# Patient Record
Sex: Female | Born: 1945 | Race: White | Hispanic: No | Smoking: Former smoker
Health system: Southern US, Community
[De-identification: ages and names within clinical notes are randomized; demographics above are authoritative.]

## PROBLEM LIST (undated history)

## (undated) ENCOUNTER — Emergency Department: Admission: EM

## (undated) DIAGNOSIS — O2230 Deep phlebothrombosis in pregnancy, unspecified trimester: Secondary | ICD-10-CM

## (undated) DIAGNOSIS — K08109 Complete loss of teeth, unspecified cause, unspecified class: Secondary | ICD-10-CM

## (undated) DIAGNOSIS — Z9221 Personal history of antineoplastic chemotherapy: Secondary | ICD-10-CM

## (undated) DIAGNOSIS — E7800 Pure hypercholesterolemia, unspecified: Secondary | ICD-10-CM

## (undated) DIAGNOSIS — C50919 Malignant neoplasm of unspecified site of unspecified female breast: Secondary | ICD-10-CM

## (undated) DIAGNOSIS — I82409 Acute embolism and thrombosis of unspecified deep veins of unspecified lower extremity: Secondary | ICD-10-CM

## (undated) DIAGNOSIS — Z923 Personal history of irradiation: Secondary | ICD-10-CM

## (undated) DIAGNOSIS — I4891 Unspecified atrial fibrillation: Secondary | ICD-10-CM

## (undated) DIAGNOSIS — C4490 Unspecified malignant neoplasm of skin, unspecified: Secondary | ICD-10-CM

## (undated) DIAGNOSIS — C4492 Squamous cell carcinoma of skin, unspecified: Secondary | ICD-10-CM

## (undated) DIAGNOSIS — I10 Essential (primary) hypertension: Secondary | ICD-10-CM

## (undated) DIAGNOSIS — Z972 Presence of dental prosthetic device (complete) (partial): Secondary | ICD-10-CM

## (undated) DIAGNOSIS — I5030 Unspecified diastolic (congestive) heart failure: Secondary | ICD-10-CM

## (undated) HISTORY — PX: GANGLION CYST EXCISION: SHX1691

## (undated) HISTORY — DX: Malignant neoplasm of unspecified site of unspecified female breast: C50.919

## (undated) HISTORY — DX: Unspecified malignant neoplasm of skin, unspecified: C44.90

## (undated) HISTORY — DX: Squamous cell carcinoma of skin, unspecified: C44.92

## (undated) HISTORY — DX: Essential (primary) hypertension: I10

## (undated) HISTORY — DX: Pure hypercholesterolemia, unspecified: E78.00

---

## 2004-01-30 ENCOUNTER — Ambulatory Visit: Payer: Self-pay | Admitting: Internal Medicine

## 2005-09-15 ENCOUNTER — Ambulatory Visit: Payer: Self-pay | Admitting: Internal Medicine

## 2005-09-15 IMAGING — US SCREENING ULTRASOUND OF ABDOMINAL AORTA
1 series · 14 of 18 positions shown · non-contrast
Comparison: none

REASON FOR EXAM: Abd pain, evaluate AAA
COMMENTS:

[Series 1: screening ultrasound of abdominal aorta · 0.40mm/px · 14 of 18 slices shown]
[im 1/18]
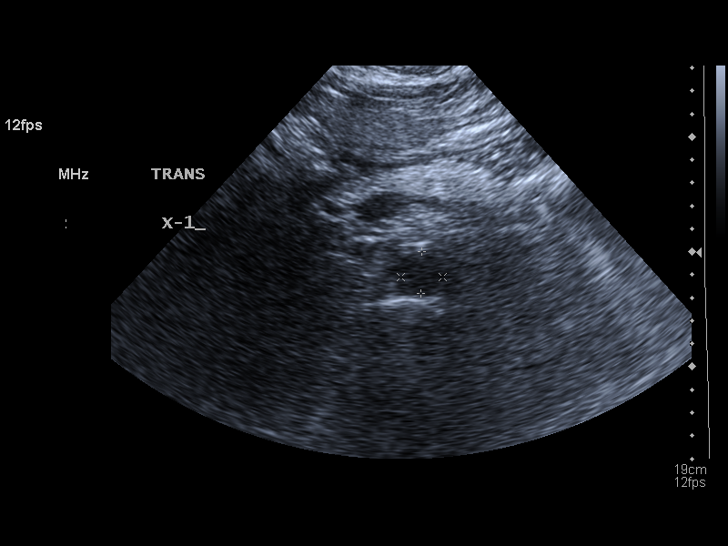
[im 2/18]
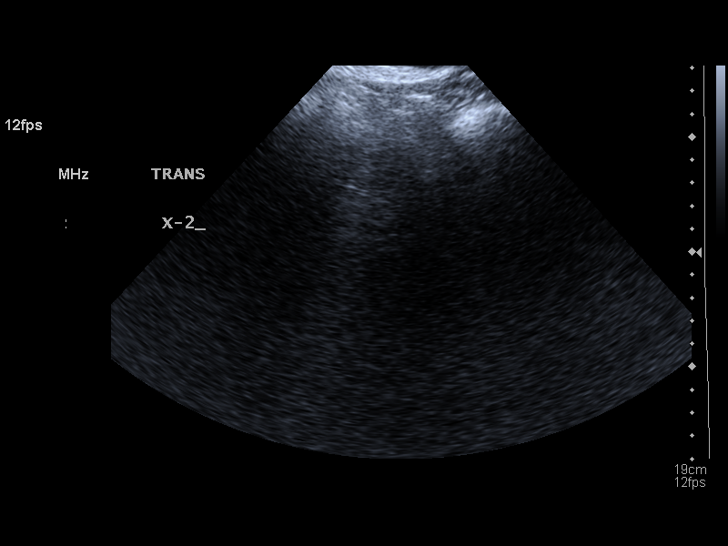
[im 4/18]
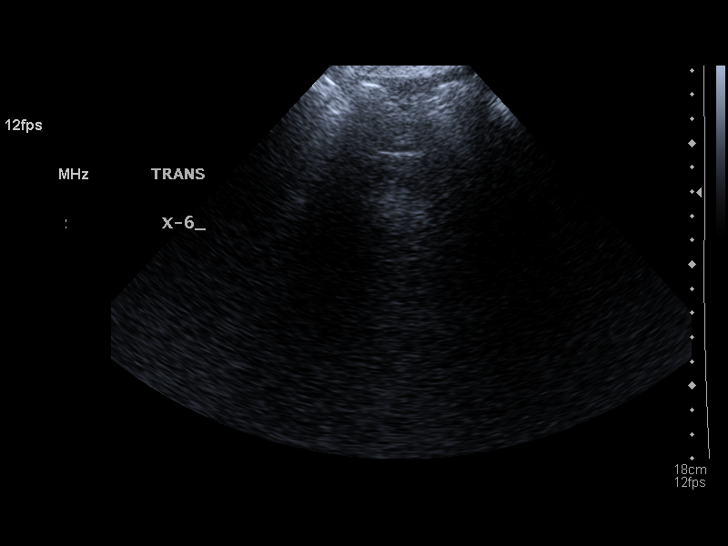
[im 5/18]
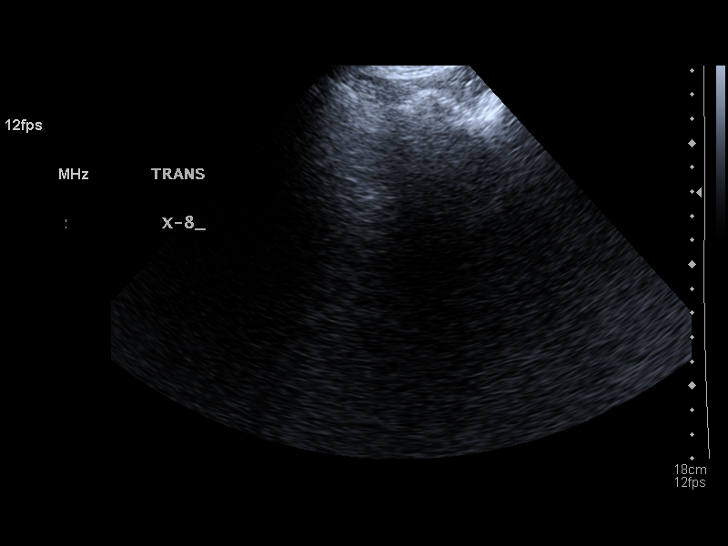
[im 6/18]
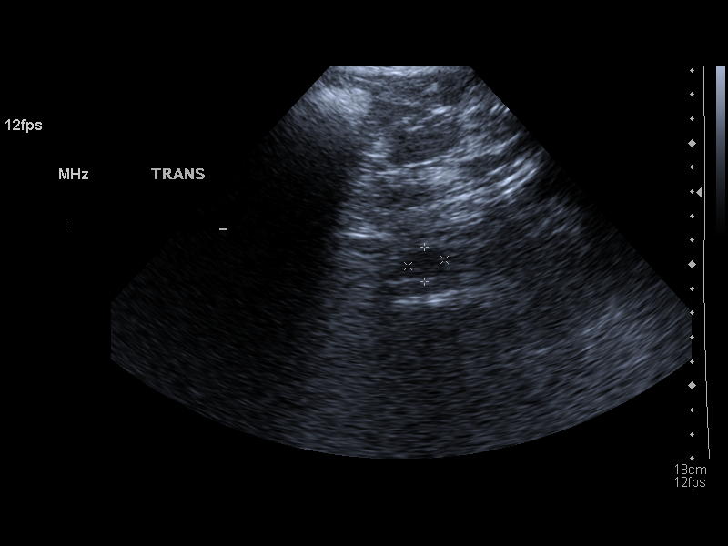
[im 8/18]
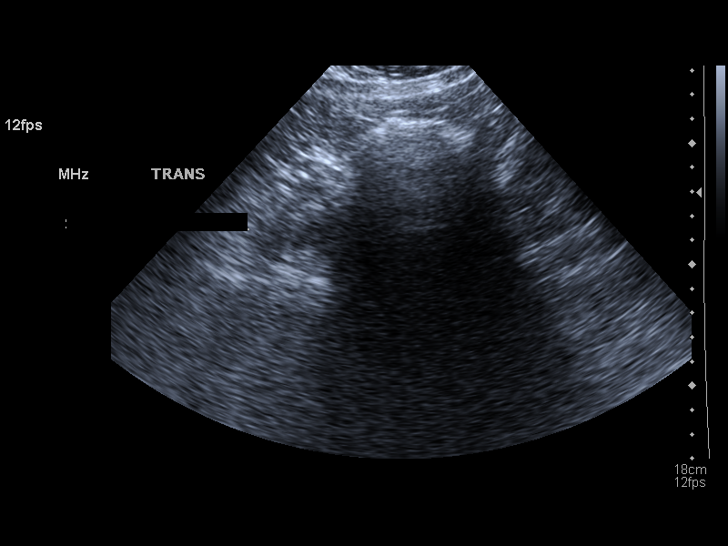
[im 9/18]
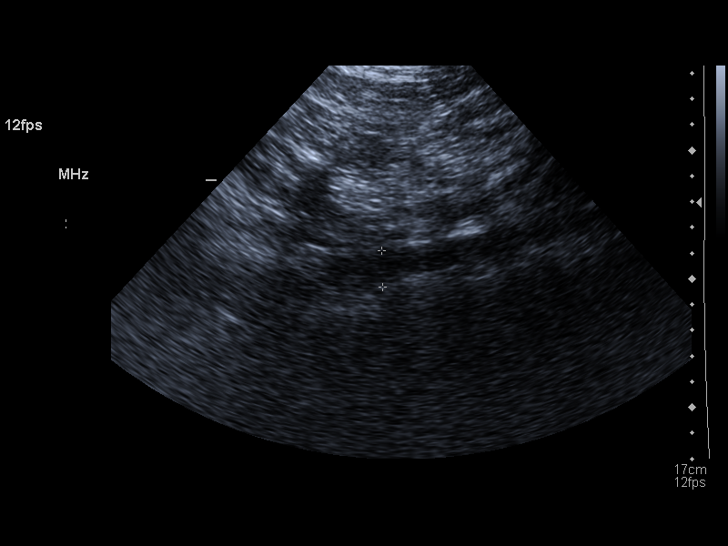
[im 10/18]
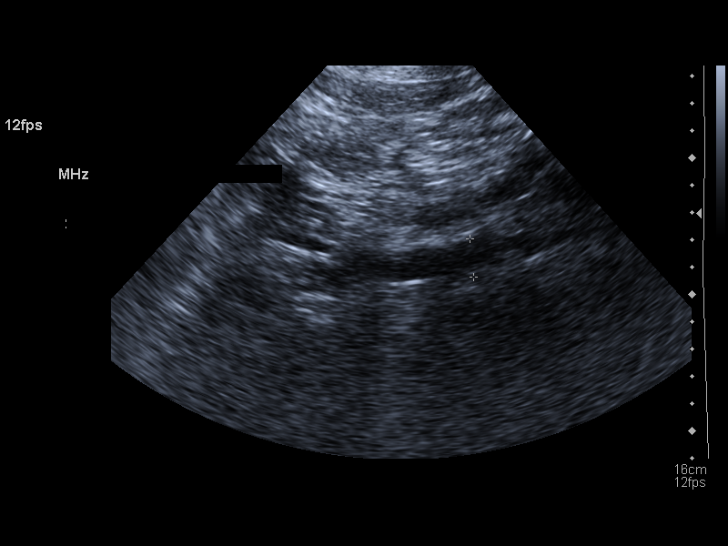
[im 11/18]
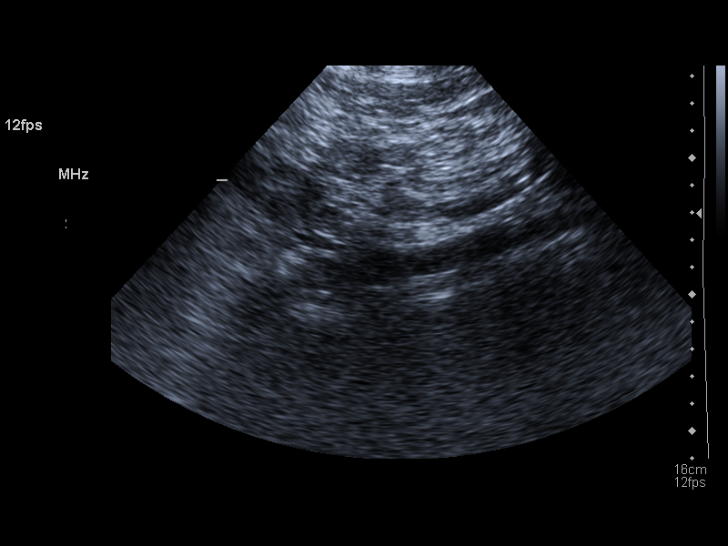
[im 13/18]
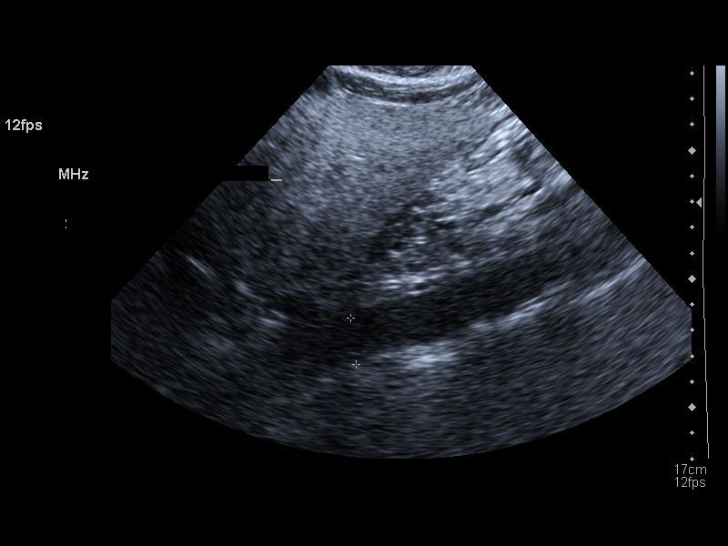
[im 14/18]
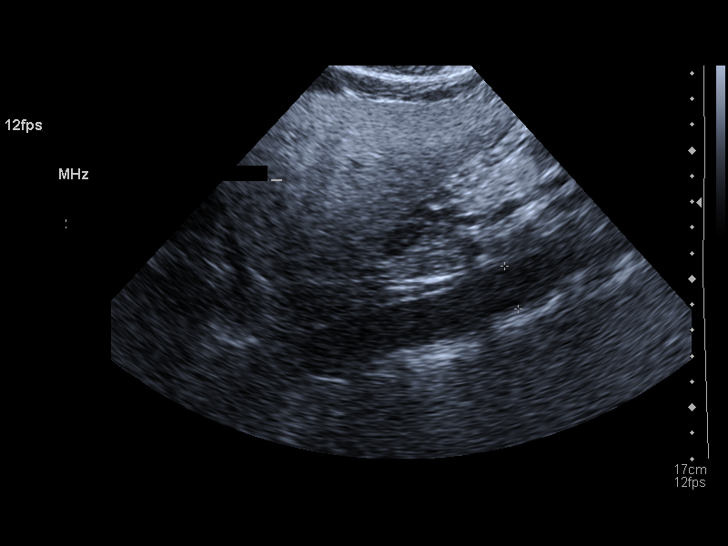
[im 15/18]
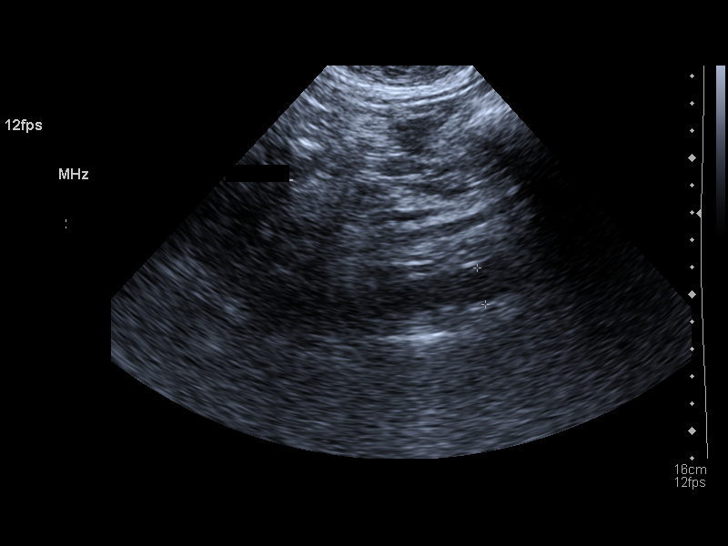
[im 17/18]
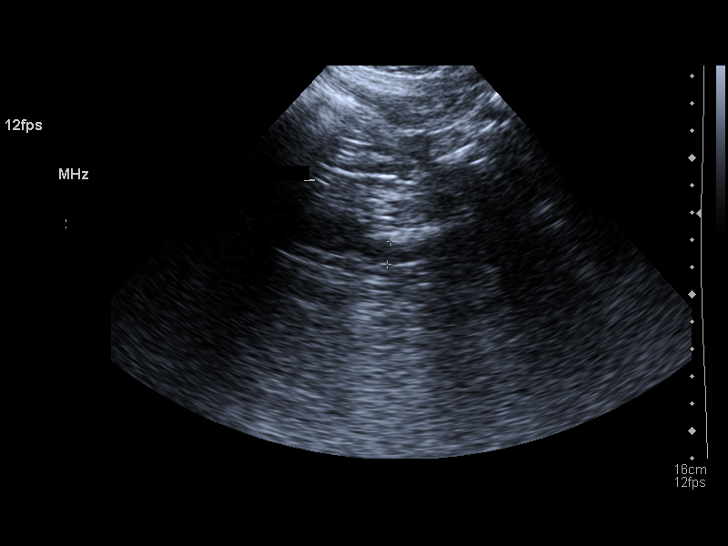
[im 18/18]
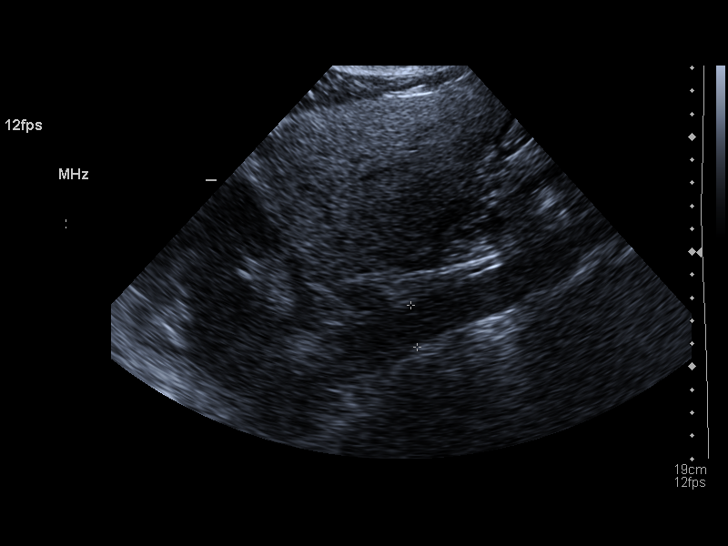

[14 of 18 positions shown; findings below may reference images not displayed]

PROCEDURE:     US  - US EXAM AAA SCREENING  - [DATE] [DATE]

RESULT:     The patient is experiencing abdominal discomfort and is being
evaluated for possible aneurysm of the abdominal aorta.

The maximal dimension of the abdominal aorta is approximately 18 mm.  There
is no evidence of an aneurysm.  The surrounding soft tissues exhibit normal
echotexture.
IMPRESSION: There is no evidence of an abdominal aortic aneurysm.  The proximal common
iliac vessels are normal in caliber as well.

## 2006-03-24 ENCOUNTER — Ambulatory Visit: Payer: Self-pay | Admitting: Internal Medicine

## 2006-03-24 DIAGNOSIS — C4492 Squamous cell carcinoma of skin, unspecified: Secondary | ICD-10-CM

## 2006-03-24 HISTORY — DX: Squamous cell carcinoma of skin, unspecified: C44.92

## 2006-05-05 DIAGNOSIS — C4492 Squamous cell carcinoma of skin, unspecified: Secondary | ICD-10-CM

## 2006-05-05 HISTORY — DX: Squamous cell carcinoma of skin, unspecified: C44.92

## 2006-06-29 DIAGNOSIS — L570 Actinic keratosis: Secondary | ICD-10-CM

## 2006-06-29 HISTORY — DX: Actinic keratosis: L57.0

## 2007-03-30 ENCOUNTER — Ambulatory Visit: Payer: Self-pay | Admitting: Gastroenterology

## 2007-10-12 ENCOUNTER — Ambulatory Visit: Payer: Self-pay | Admitting: Internal Medicine

## 2007-10-12 IMAGING — US US EXTREM LOW VENOUS*L*
1 series · 14 of 21 positions shown · non-contrast
Comparison: none

REASON FOR EXAM: knee pain lower extremity pain  Eval  DVT
COMMENTS:

[Series 1: us extrem low venous*left* · 14 of 21 slices shown]
[im 1/21]
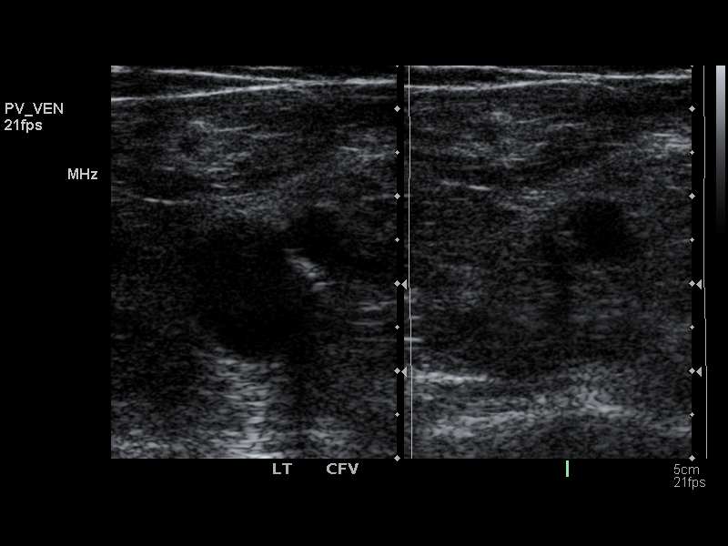
[im 3/21]
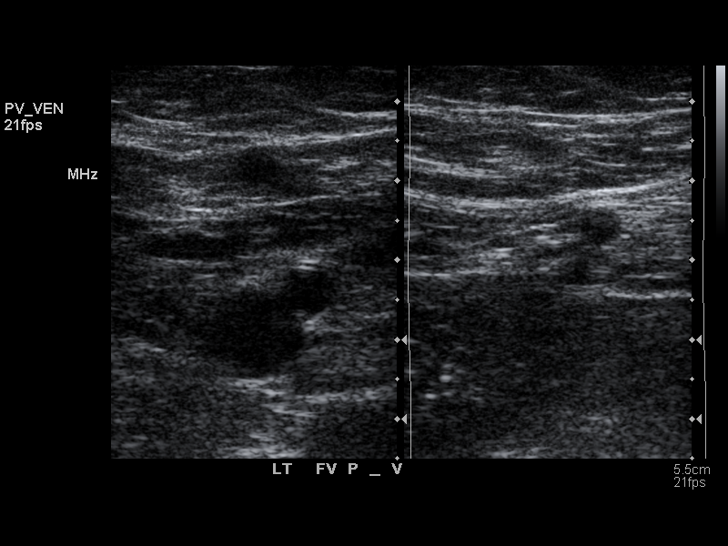
[im 4/21]
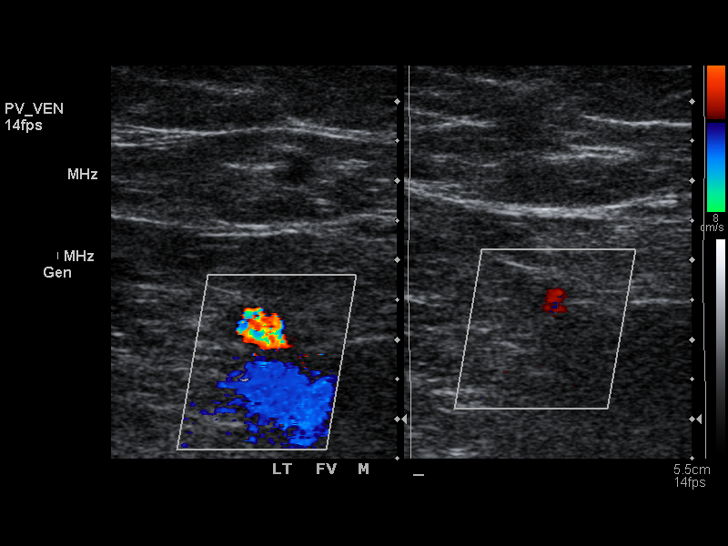
[im 6/21]
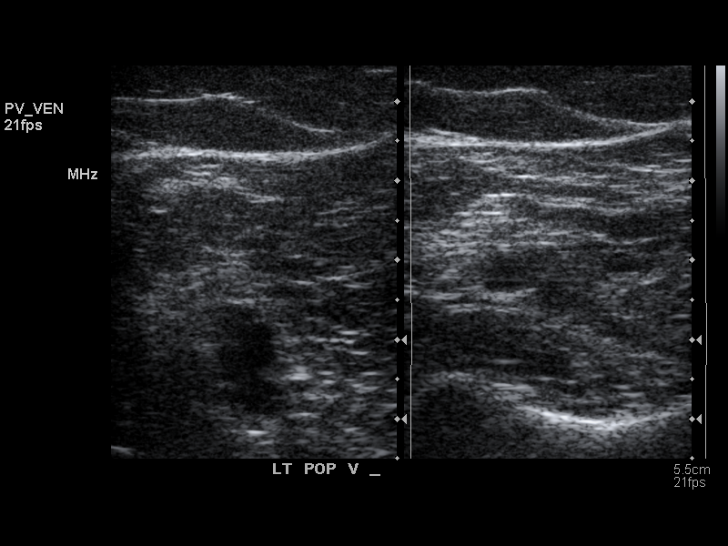
[im 7/21]
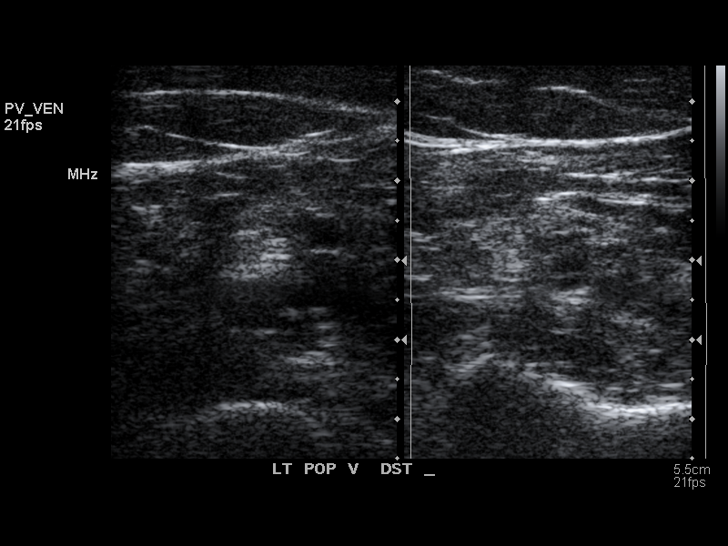
[im 9/21]
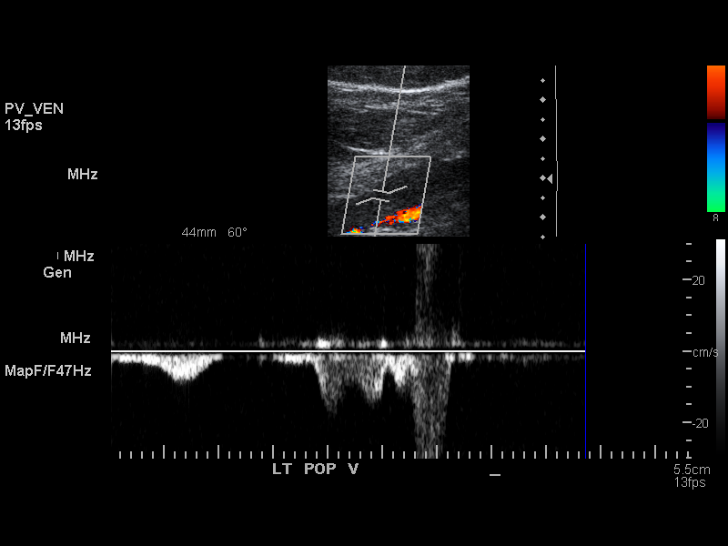
[im 10/21]
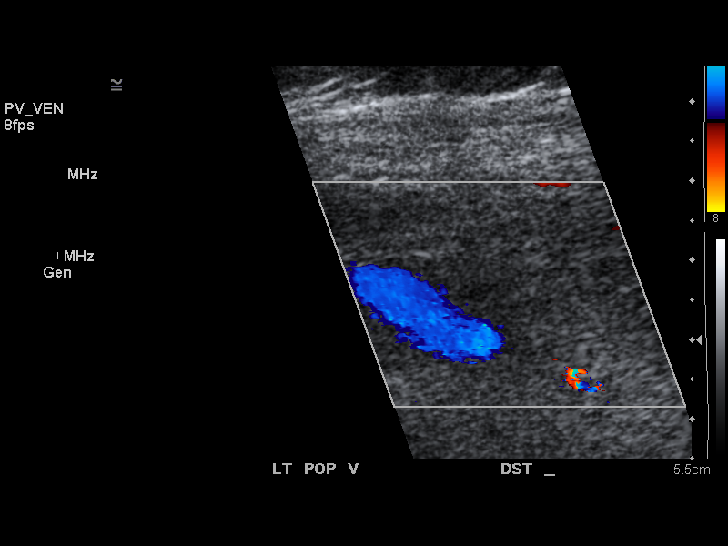
[im 12/21]
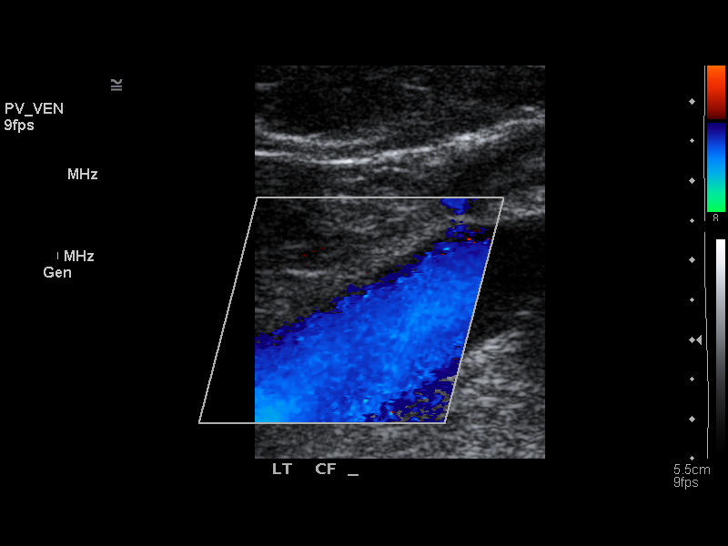
[im 13/21]
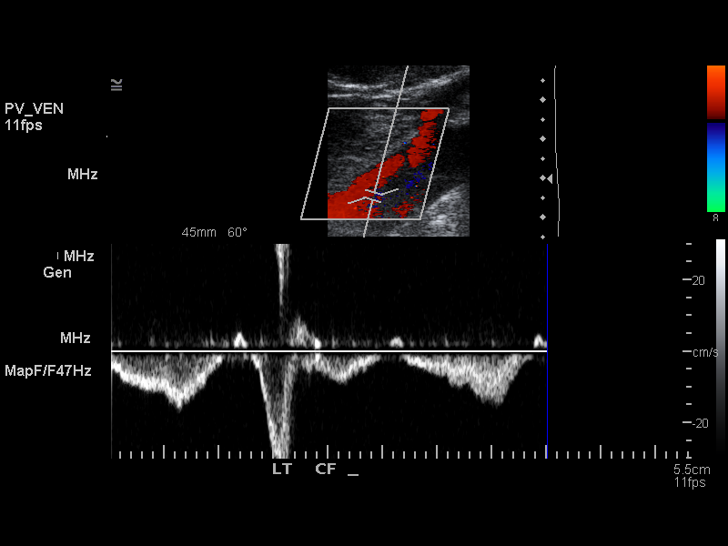
[im 15/21]
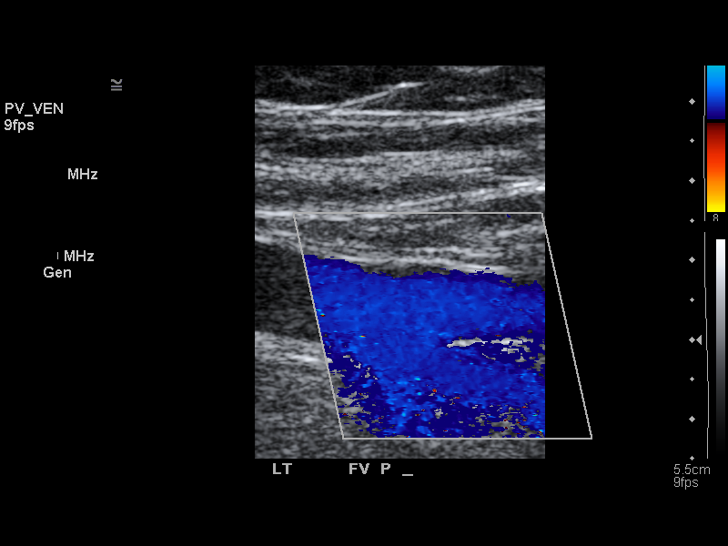
[im 16/21]
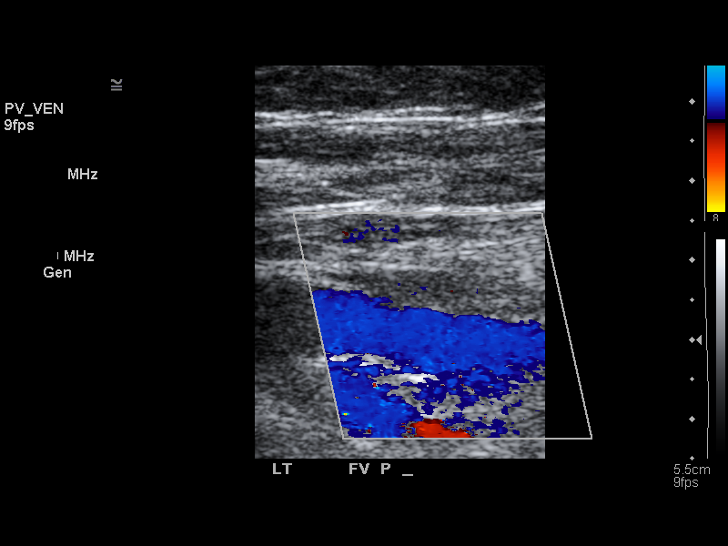
[im 18/21]
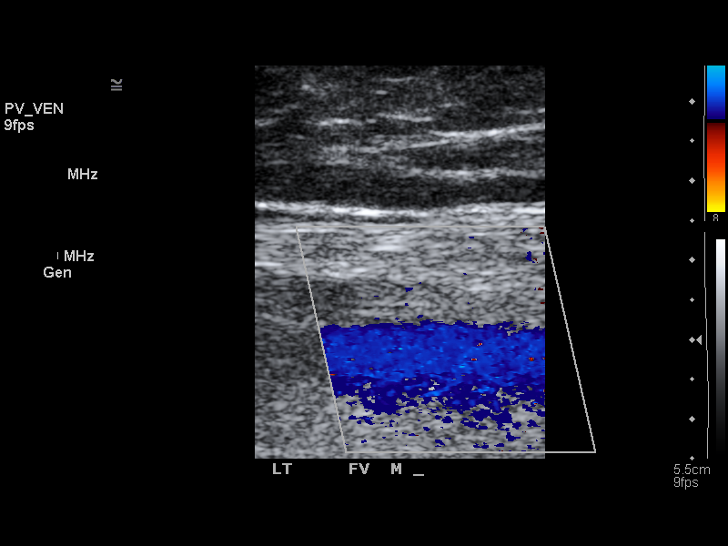
[im 19/21]
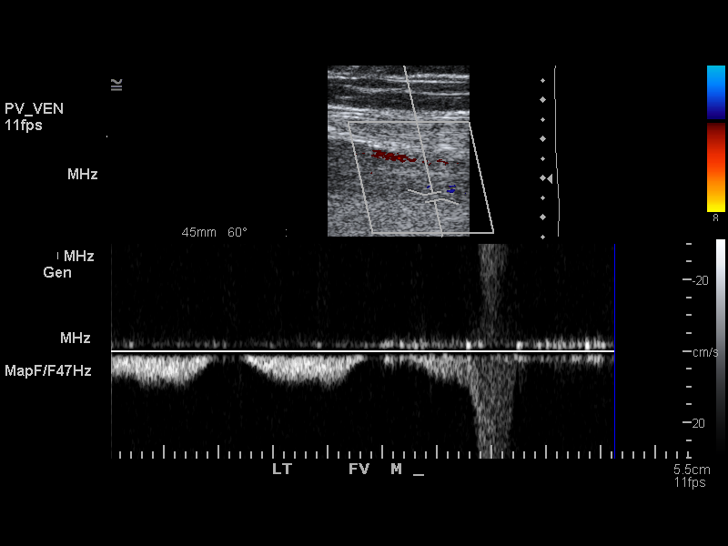
[im 21/21]
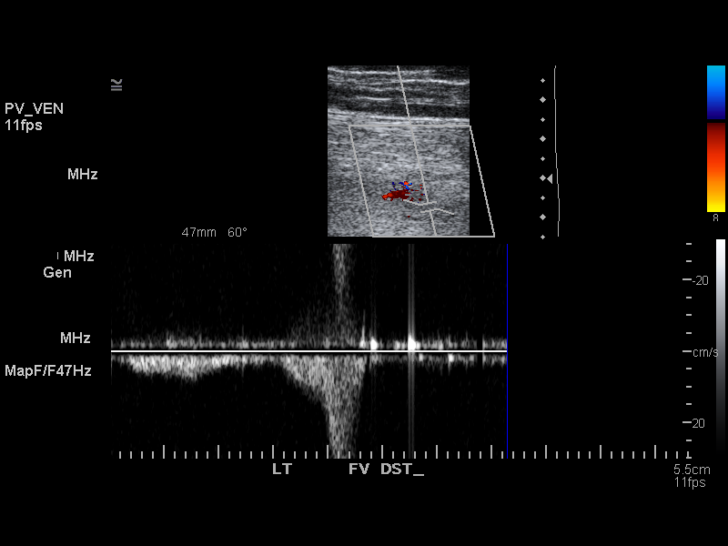

[14 of 21 positions shown; findings below may reference images not displayed]

PROCEDURE:     US  - US DOPPLER LOW EXTR LEFT  - [DATE] [DATE]

RESULT:     Gray scale, Duplex and SPECTRAL waveform imaging was performed
of the LEFT lower extremity. Evaluation of the interrogated vessels
demonstrates no evidence of increased echogenicity, non-compressibility,
abnormal gray scale flow nor abnormal waveform.
IMPRESSION: 1.No sonographic evidence of deep venous thrombus within the interrogated
vessels of the LEFT lower extremity.

## 2008-04-12 ENCOUNTER — Ambulatory Visit: Payer: Self-pay | Admitting: Internal Medicine

## 2008-04-12 IMAGING — MG MM CAD SCREENING MAMMO
1 series · 4 of 4 positions shown · non-contrast
Comparison: none

REASON FOR EXAM: Screening Mammo
COMMENTS:

PROCEDURE:     MAM - MAM DGTL SCREENING MAMMO W/CAD  - [DATE]  [DATE]
RESULT:     No dominant masses or pathologic clustered calcifications are
demonstrated. The exam is stable from prior exams. CAD evaluation is
nonfocal.

[R CC · right · 4 of 4 slices shown]
[im 1/4]
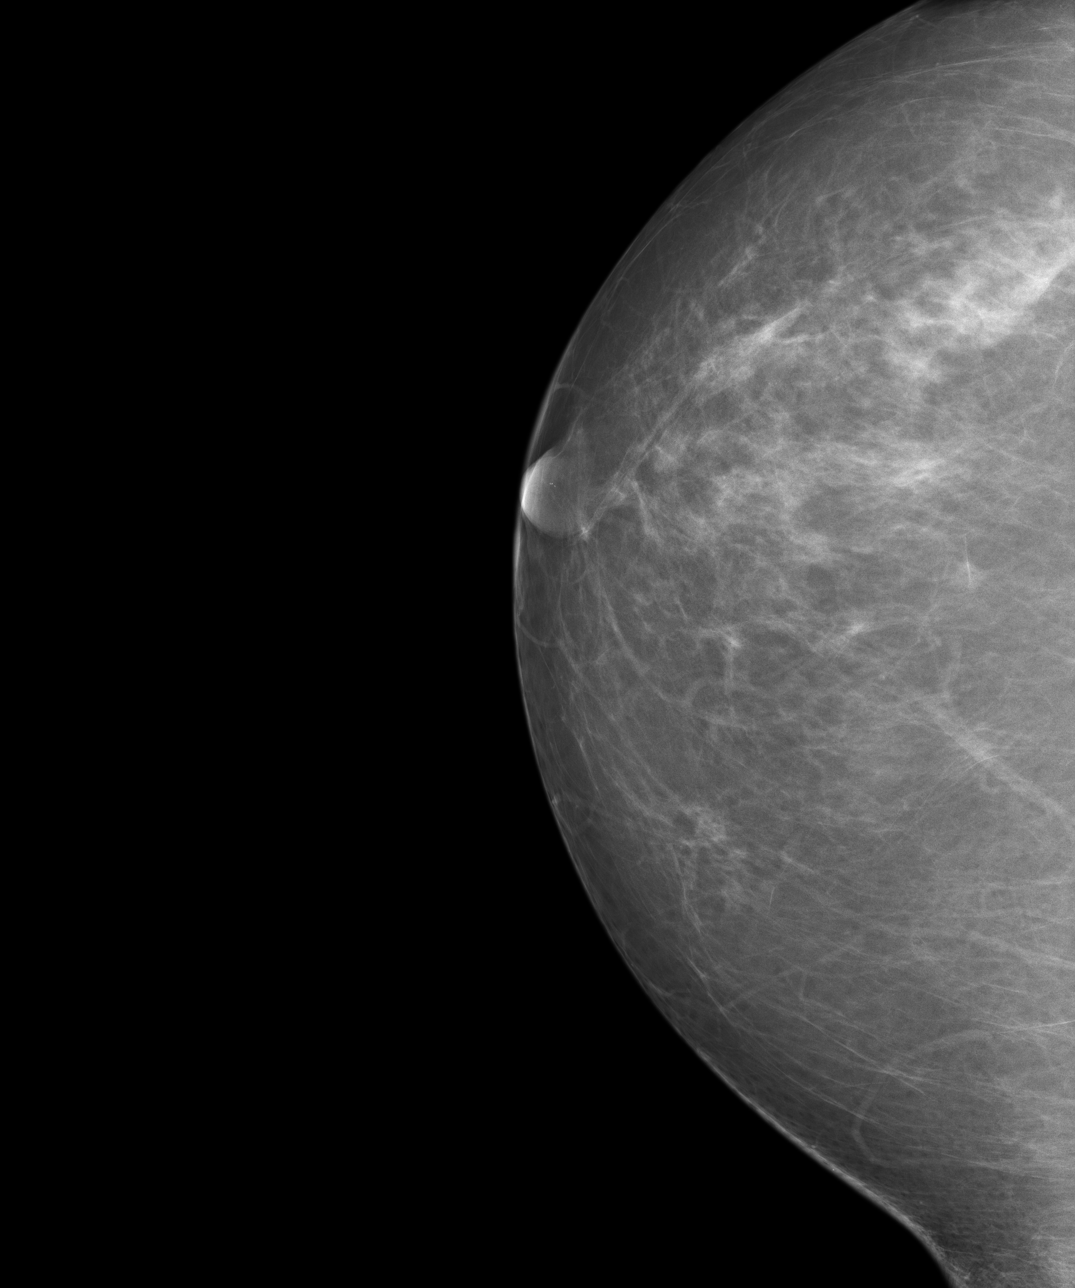
[im 2/4]
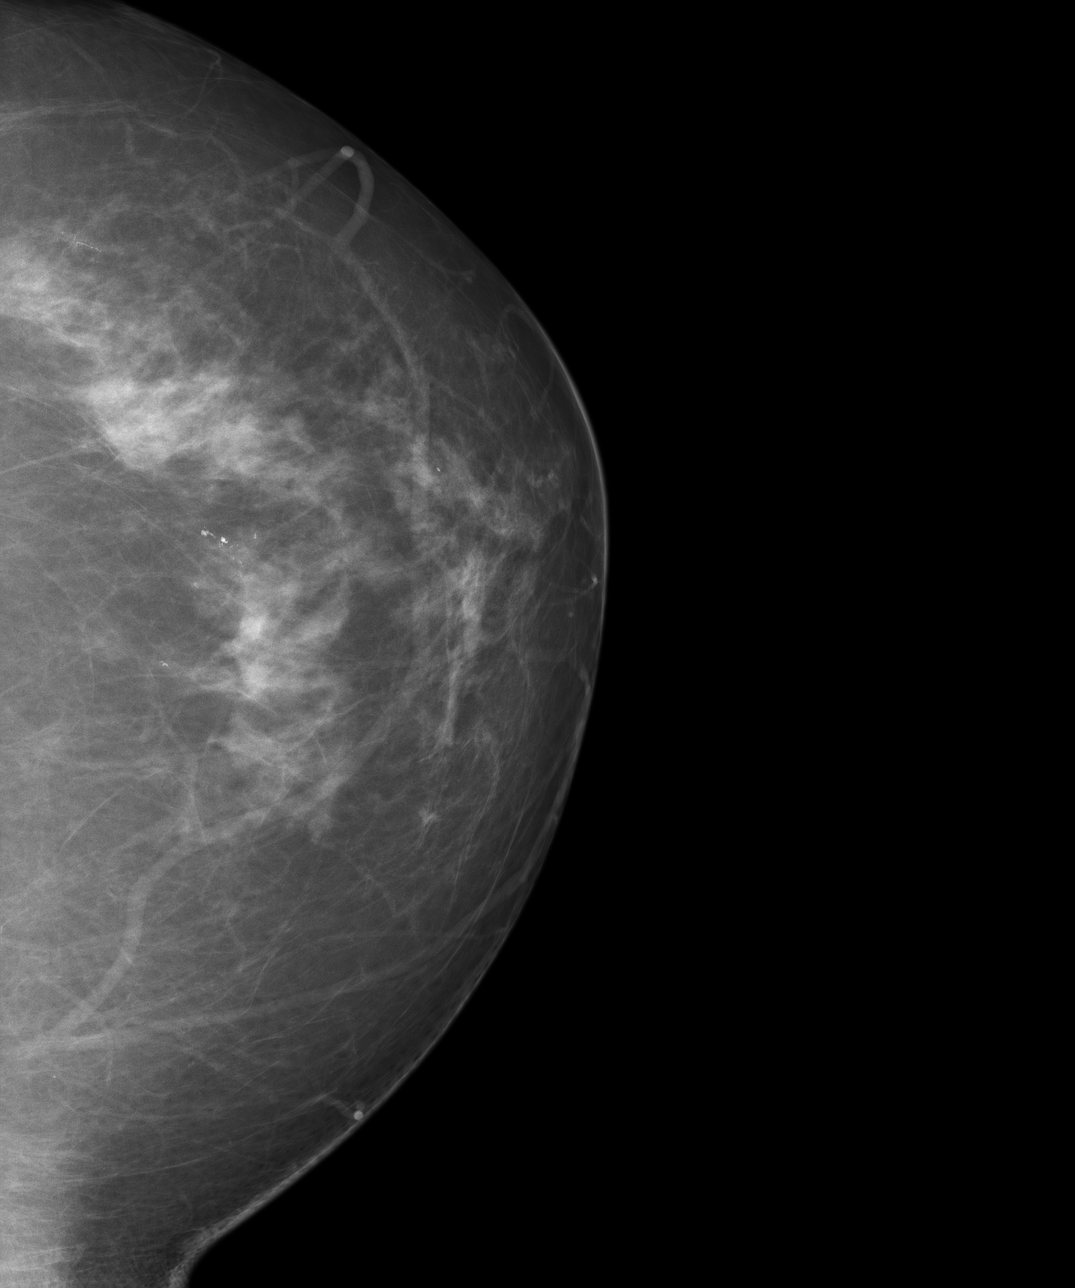
[im 3/4]
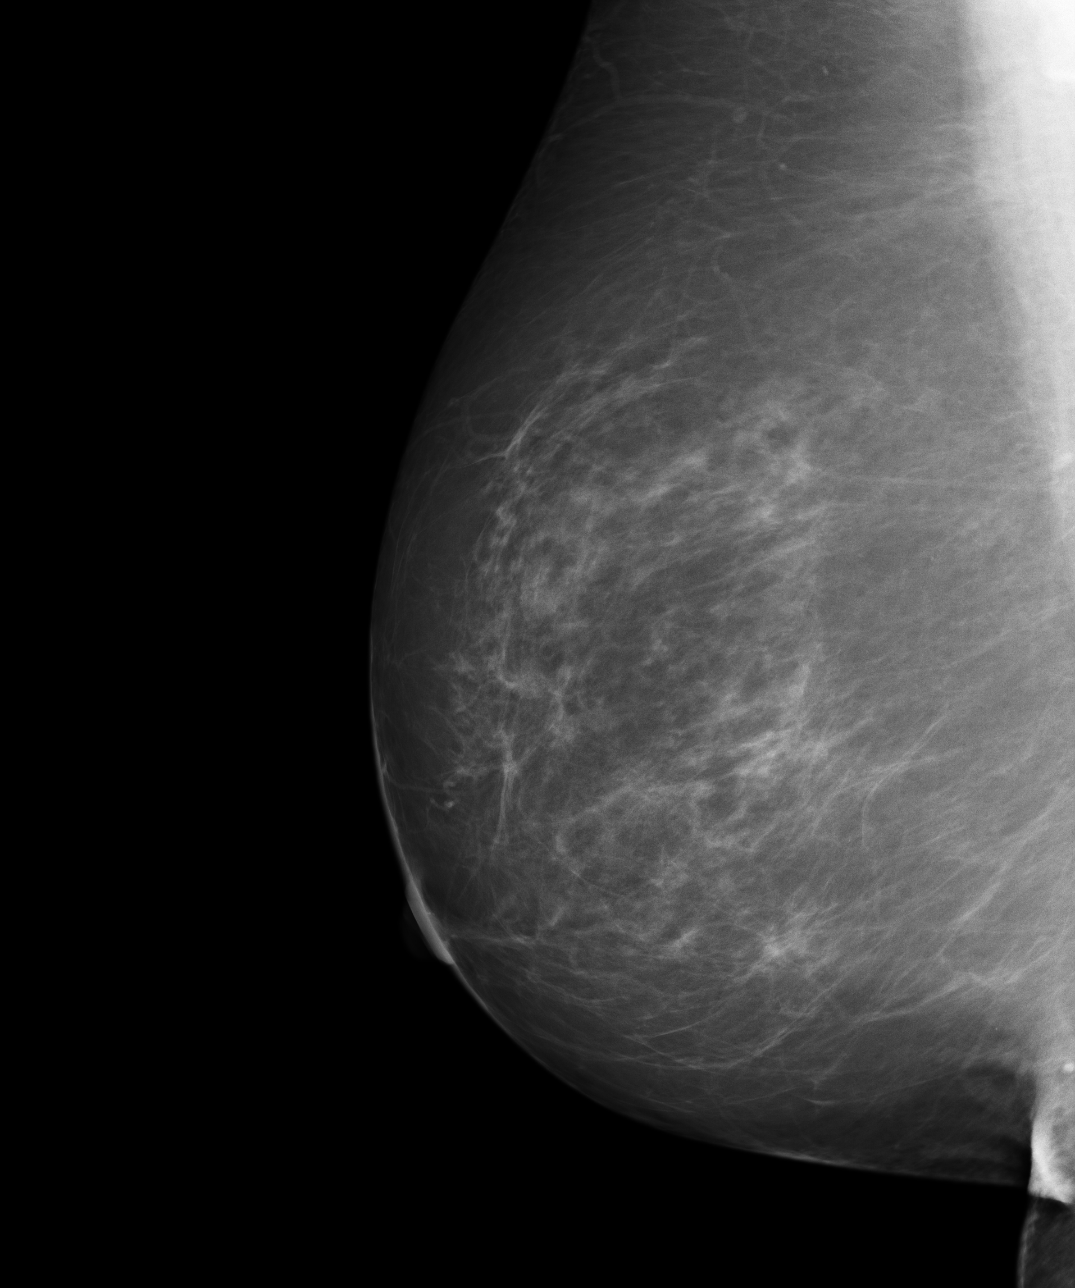
[im 4/4]
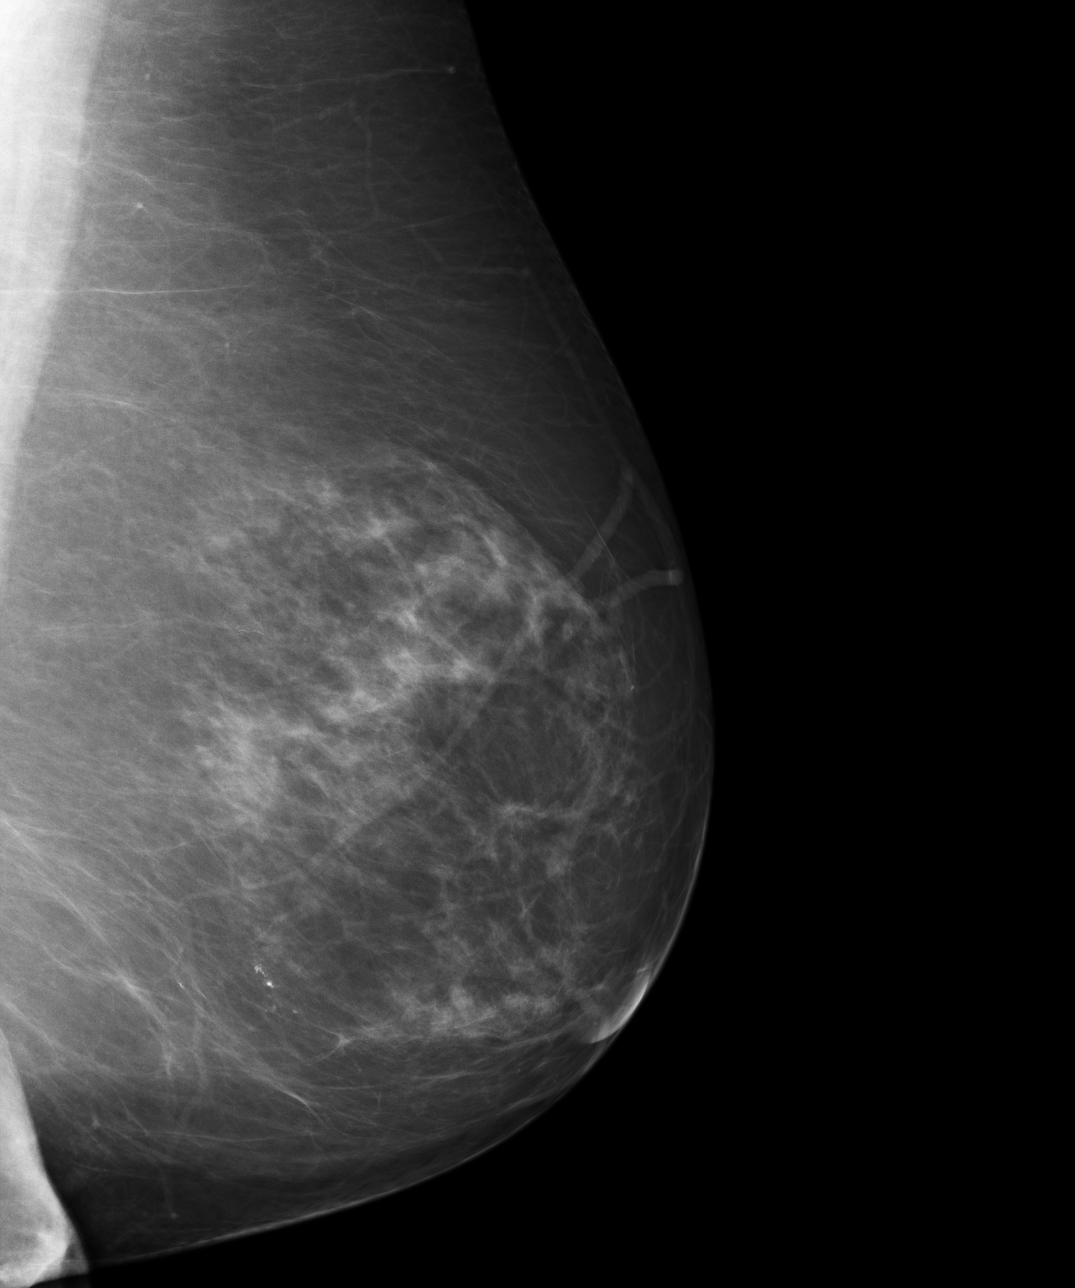

[4 of 4 positions shown; findings below may reference images not displayed]

IMPRESSION: Benign exam. Routine yearly follow-up mammograms are suggested.

BI-RADS: Category 2 - Benign Finding

Thank you for this opportunity to contribute to the care of your patient.

A NEGATIVE MAMMOGRAM REPORT DOES NOT PRECLUDE BIOPSY OR OTHER EVALUATION OF
A CLINICALLY PALPABLE OR OTHERWISE SUSPICIOUS MASS OR LESION. BREAST CANCER
MAY NOT BE DETECTED BY MAMMOGRAPHY IN UP TO 10% OF CASES.

## 2010-03-05 ENCOUNTER — Ambulatory Visit: Payer: Self-pay | Admitting: Internal Medicine

## 2010-03-05 IMAGING — MG MM CAD SCREENING MAMMO
1 series · 4 of 4 positions shown · non-contrast
Comparison: none

REASON FOR EXAM: SCR
COMMENTS:

[R CC · right · 4 of 4 slices shown]
[im 1/4]
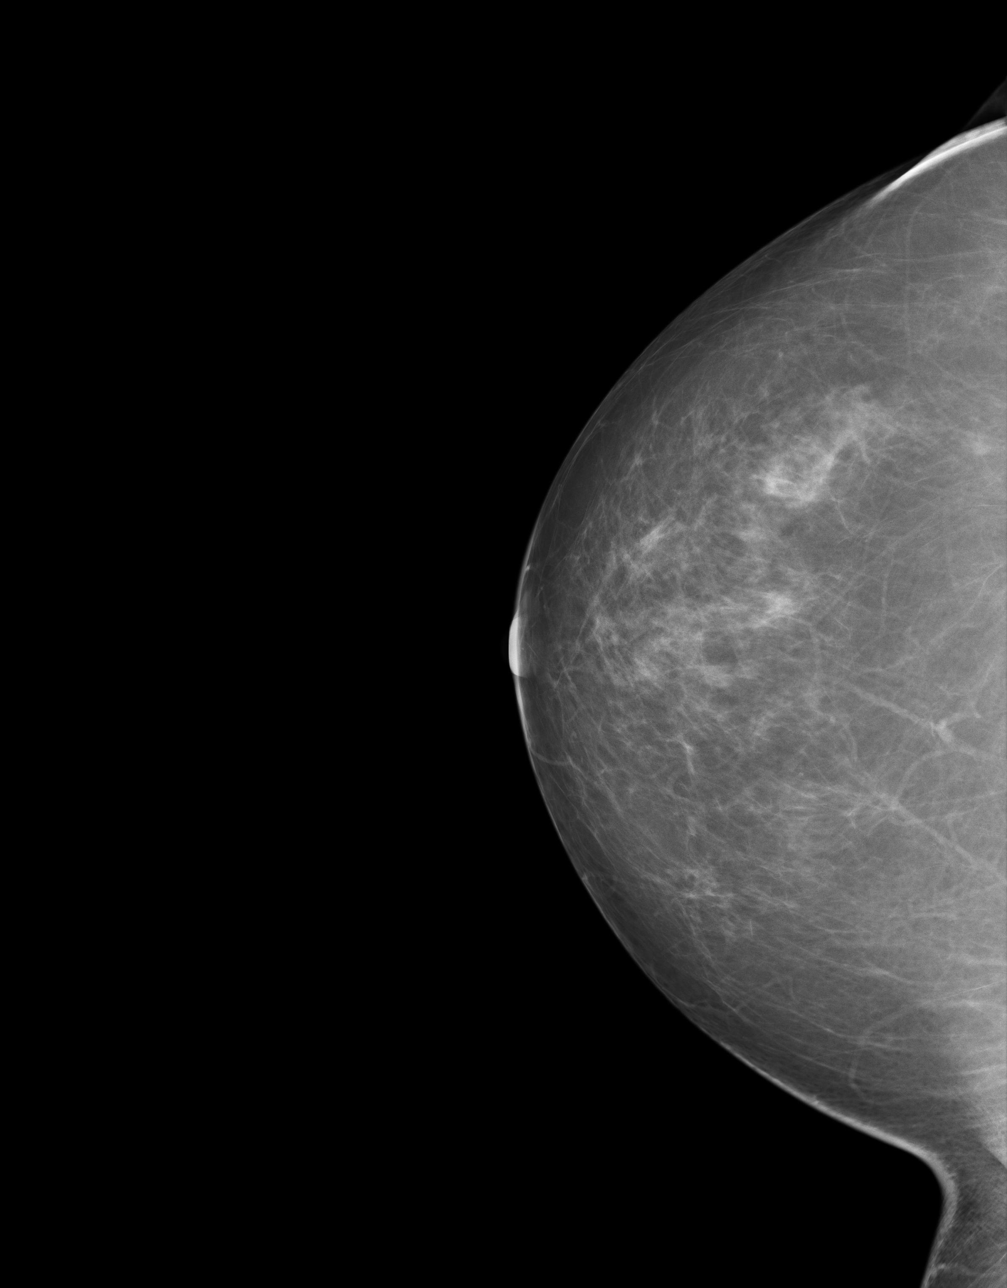
[im 2/4]
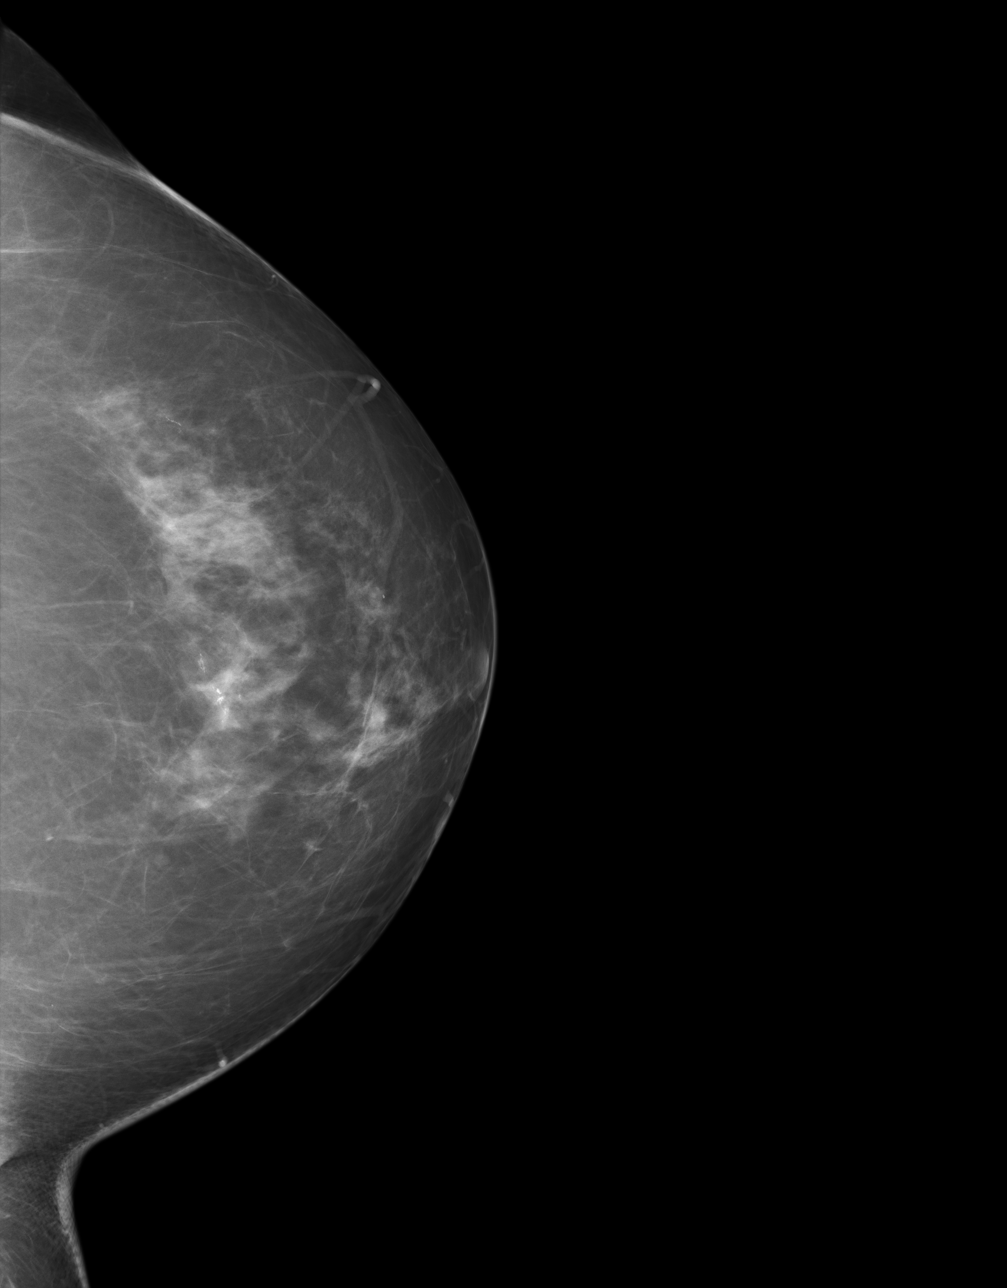
[im 3/4]
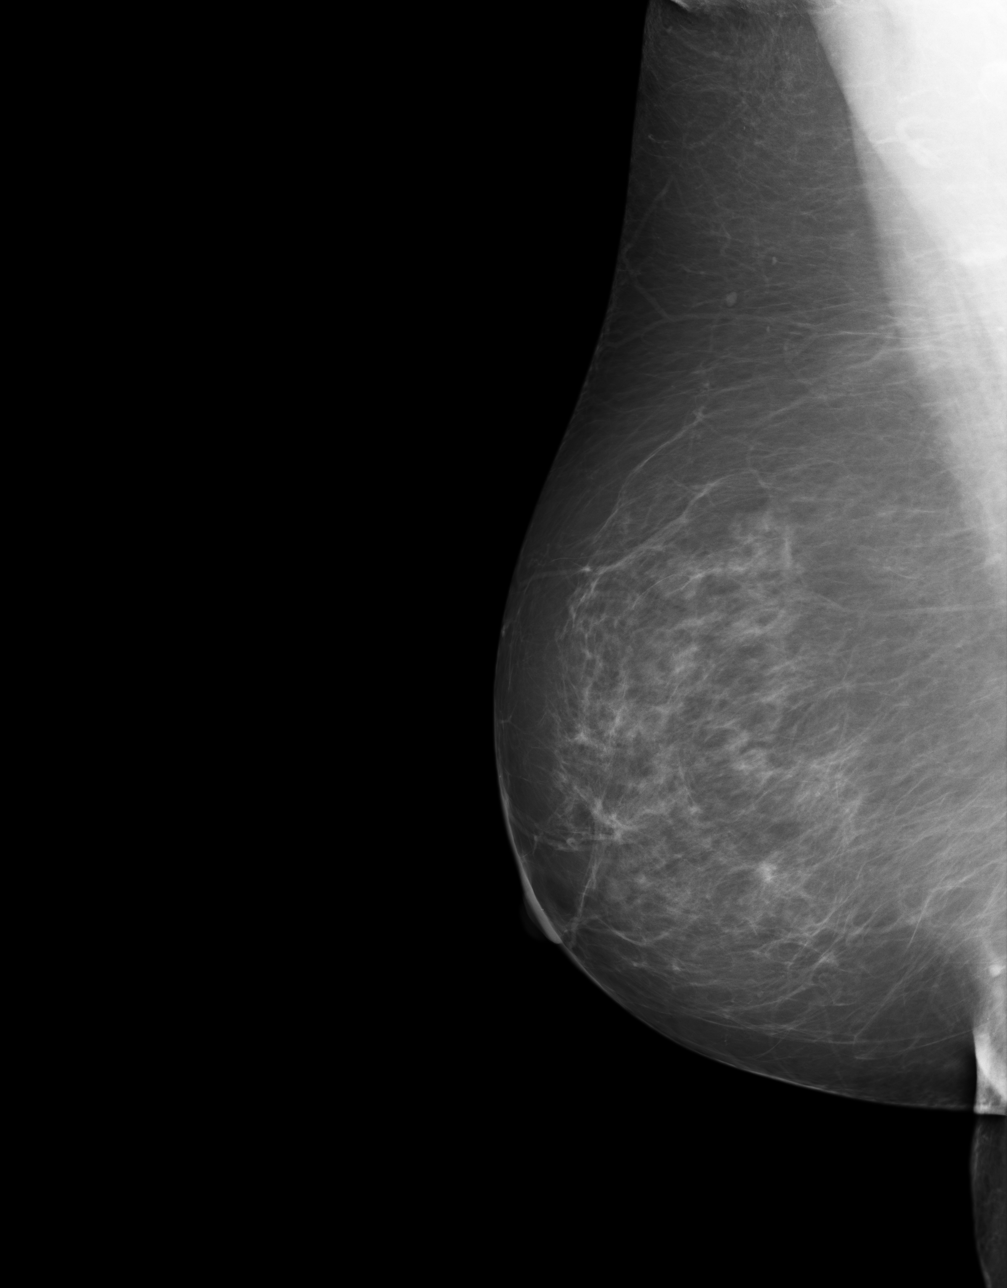
[im 4/4]
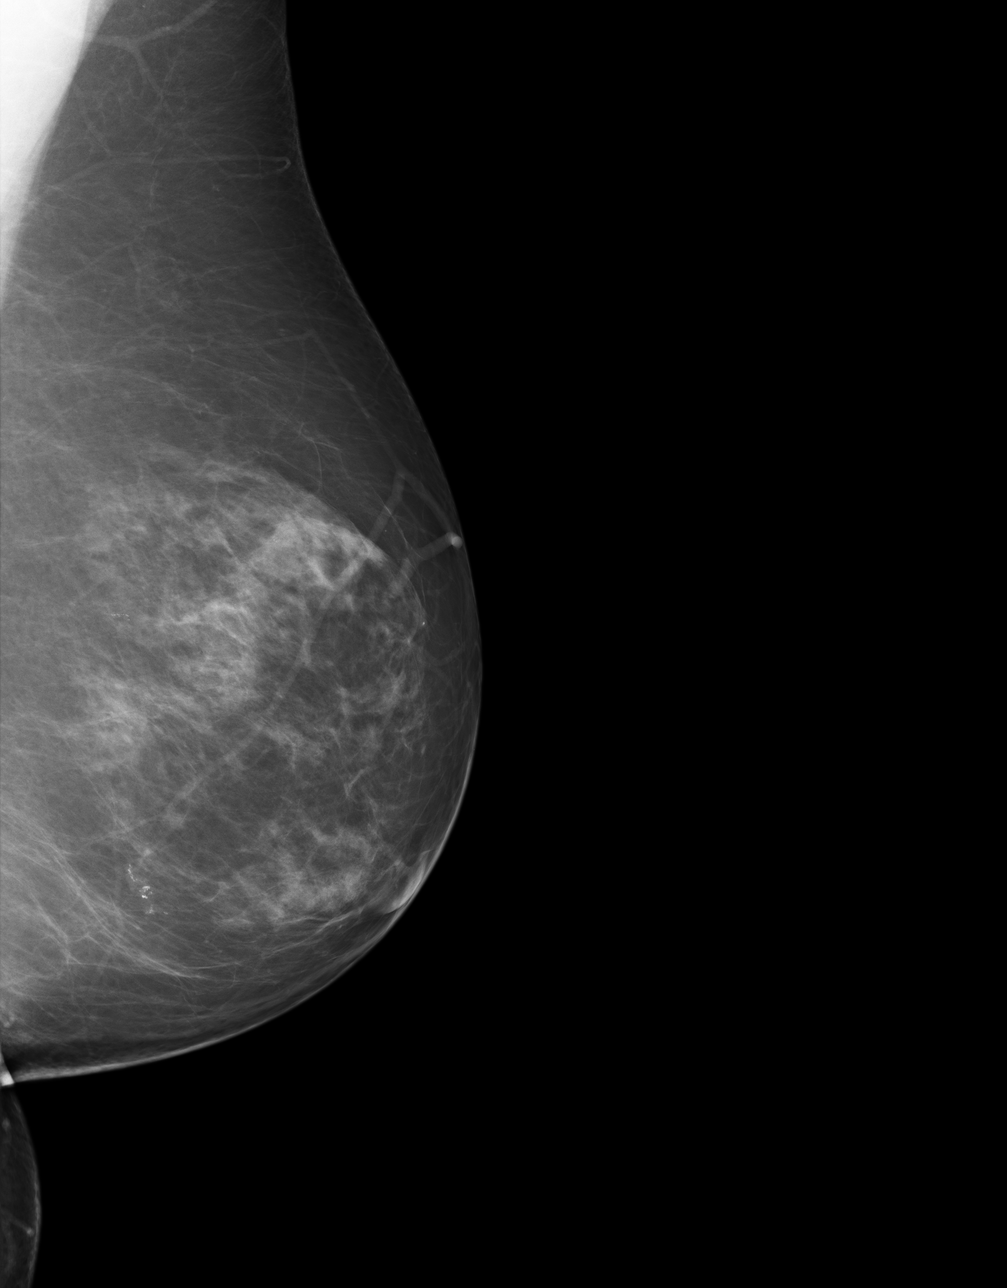

[4 of 4 positions shown; findings below may reference images not displayed]

PROCEDURE:     MAM - MAM DGTL SCREENING MAMMO W/CAD  - [DATE]  [DATE]

RESULT:     Comparison is made to analog images of [DATE] and to digital
images performed on [DATE], [DATE] and [DATE].  The breasts exhibit a
heterogeneous, moderately dense parenchymal pattern with stable
calcifications present. No developing density, dominant mass or
architectural distortion is evident.
IMPRESSION: 1.Stable, benign appearing bilateral mammogram.

BI-RADS: Category 2 - Benign Findings

RECOMMENDATIONS:

1.     Please continue to encourage yearly mammographic follow-up.

A NEGATIVE MAMMOGRAM REPORT DOES NOT PRECLUDE BIOPSY OR OTHER EVALUATION OF
A CLINICALLY PALPABLE OR OTHERWISE SUSPICIOUS MASS OR LESION. BREAST CANCER
MAY NOT BE DETECTED BY MAMMOGRAPHY IN UP TO 10% OF CASES.

## 2012-04-29 ENCOUNTER — Ambulatory Visit: Payer: Self-pay | Admitting: Internal Medicine

## 2012-04-29 IMAGING — MG MM CAD SCREENING MAMMO
2 series · 6 of 6 positions shown · non-contrast
Comparison: none

REASON FOR EXAM: SCR MAMMO NO ORDER
COMMENTS:

PROCEDURE:     MAM - MAM DGTL SCRN MAM NO ORDER W/CAD  - [DATE] [DATE]
RESULT:     COMPARISON:  [DATE], [DATE], 1 to 08
TECHNIQUE: Digital screening mammograms were obtained. FDA approved
computer-aided detection (CAD) for mammography was utilized for this study.
BREAST COMPOSITION: The breast composition is SCATTERED FIBROGLANDULAR
TISSUE (glandular tissue is  25-50%)

[R CC · right · 5 of 5 slices shown]
[im 1/5]
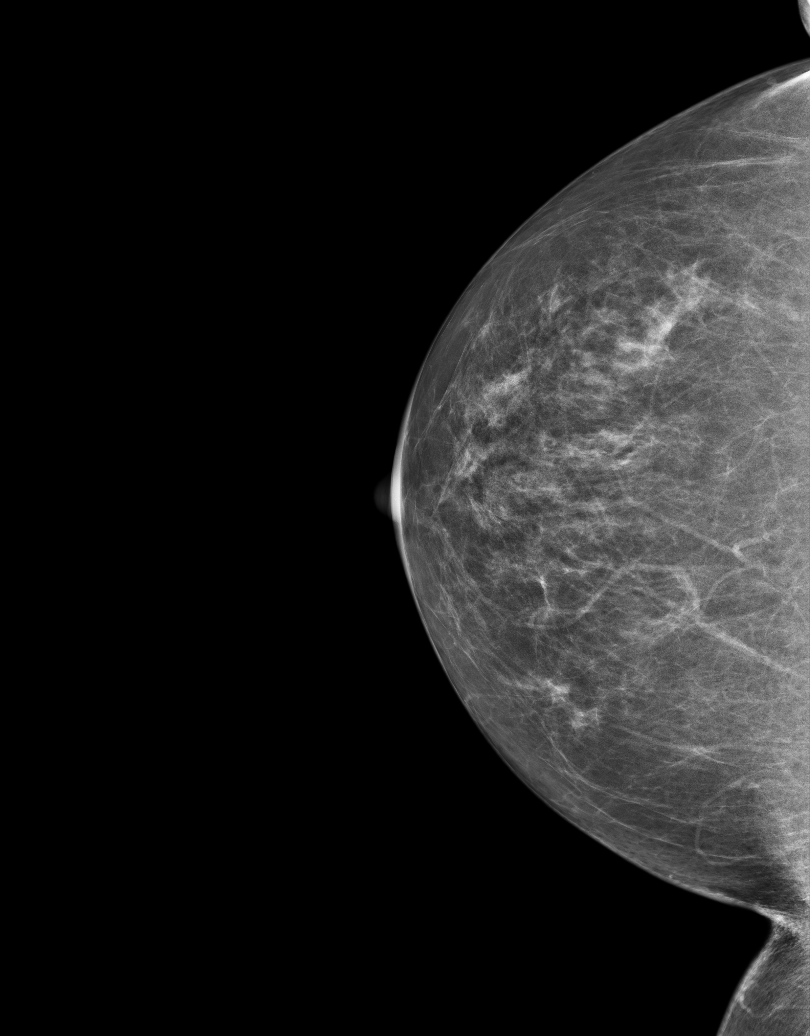
[im 2/5]
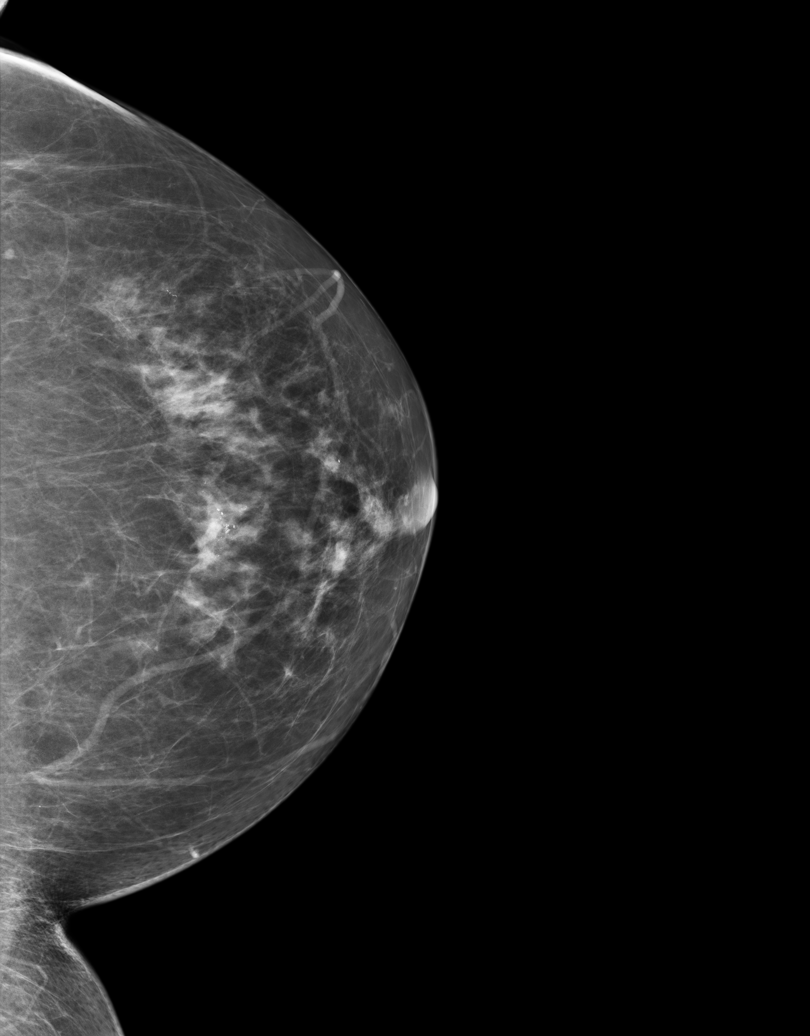
[im 3/5]
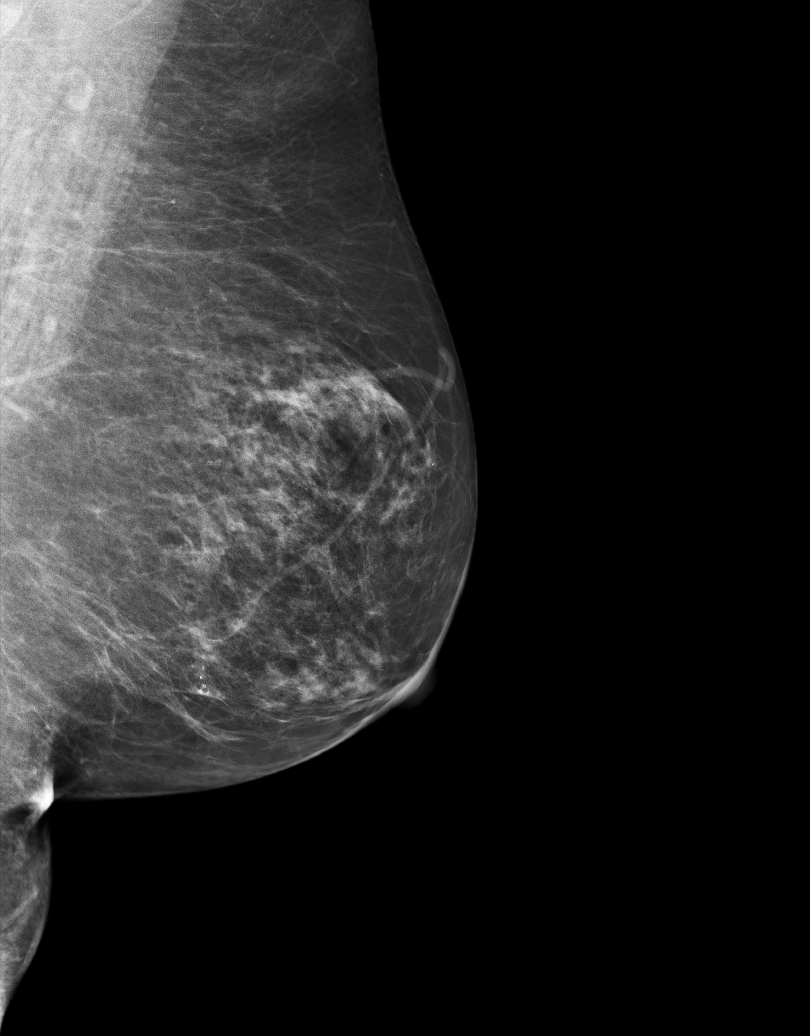
[im 4/5]
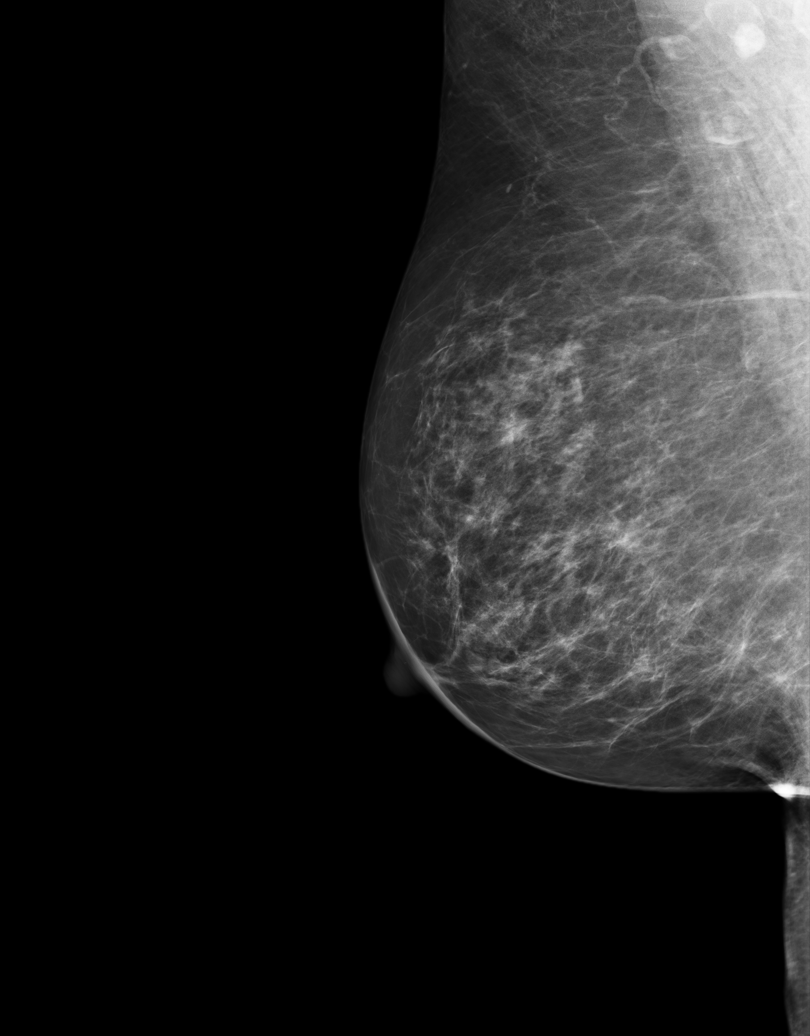
[im 5/5]
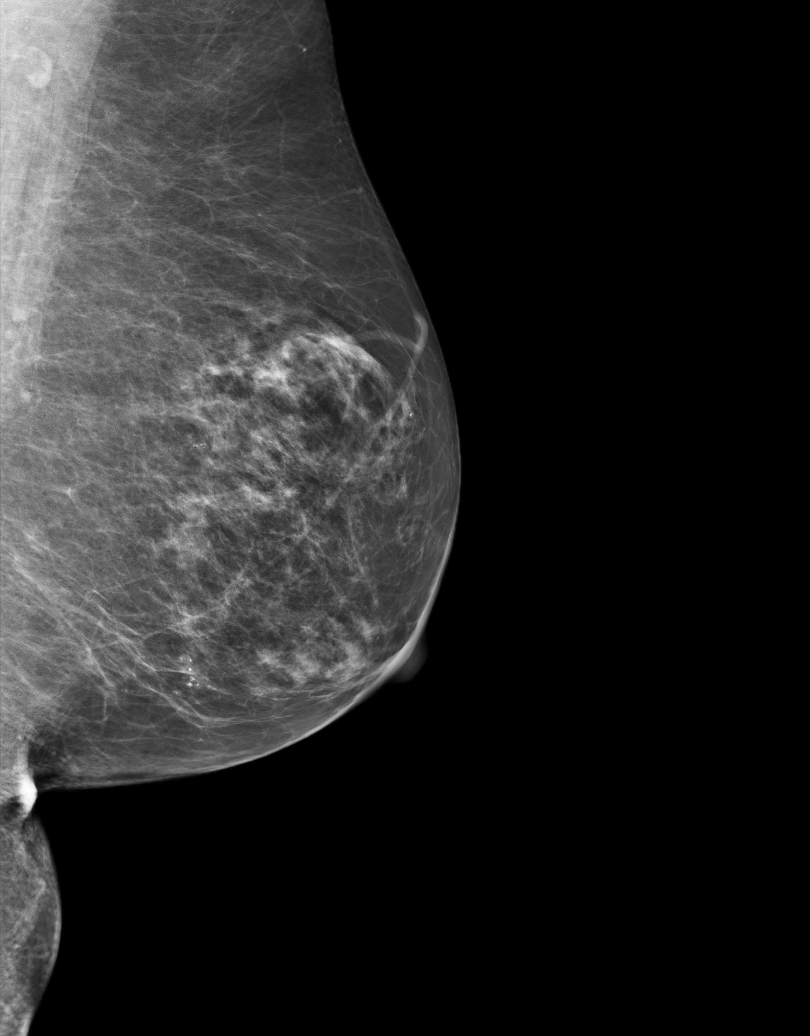

[L MLO]
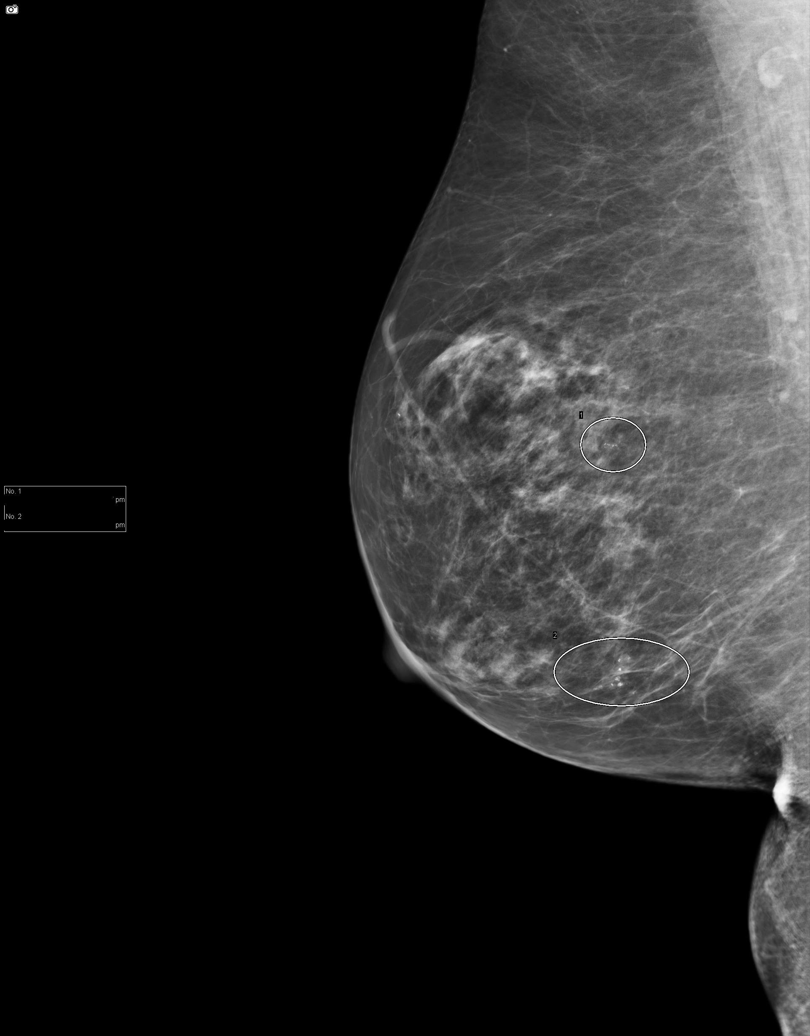

[6 of 6 positions shown; findings below may reference images not displayed]

FINDING: There are indeterminate microcalcifications in the upper outer right breast.
There is a second cluster of indeterminate microcalcifications in the
inferior left breast at approximately the [DATE] position. There is no
dominant mass or architectural distortion.
IMPRESSION: 1.     There are 2 indeterminate clusters of microcalcifications in the left
breast. Spot magnification views are recommended.

BI-RADS:  Category 0 - Needs Additional Imaging Evaluation

A negative mammogram report does not preclude biopsy or other evaluation of
a clinically palpable or otherwise suspicious mass or lesion. Breast cancer
may not be detected by mammography in up to 10% of cases.

[REDACTED]

## 2012-05-09 ENCOUNTER — Ambulatory Visit: Payer: Self-pay | Admitting: Internal Medicine

## 2012-05-09 IMAGING — MG MM ADDITIONAL VIEWS AT NO CHARGE
1 series · 6 of 6 positions shown · non-contrast
Comparison: [DATE], [DATE], [DATE], [DATE]

REASON FOR EXAM: av lt microcals
COMMENTS:

PROCEDURE:     MAM - MAM DGTL ADD VW LT  SCR  - [DATE]  [DATE]
RESULT:     TECHNIQUE: Digital diagnostic left mammograms were obtained. FDA
approved computer-aided detection (CAD) for mammography was utilized for
this study.

[L ML · left · 6 of 6 slices shown]
[im 1/6]
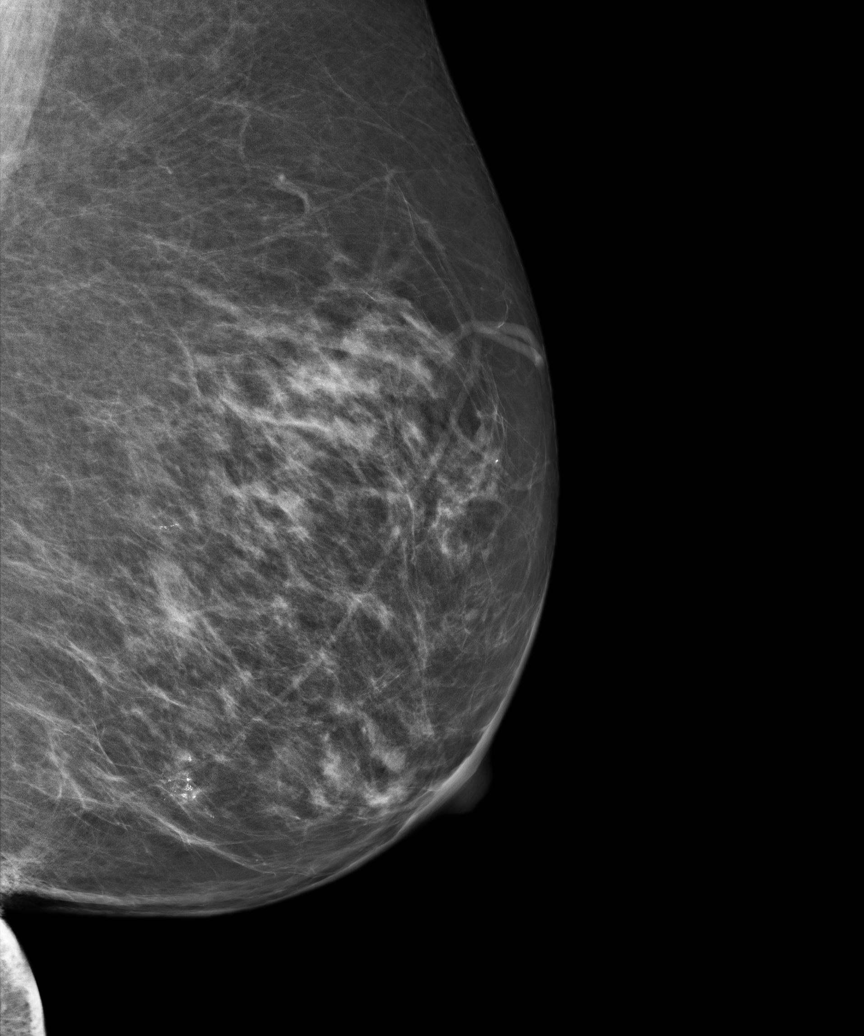
[im 2/6]
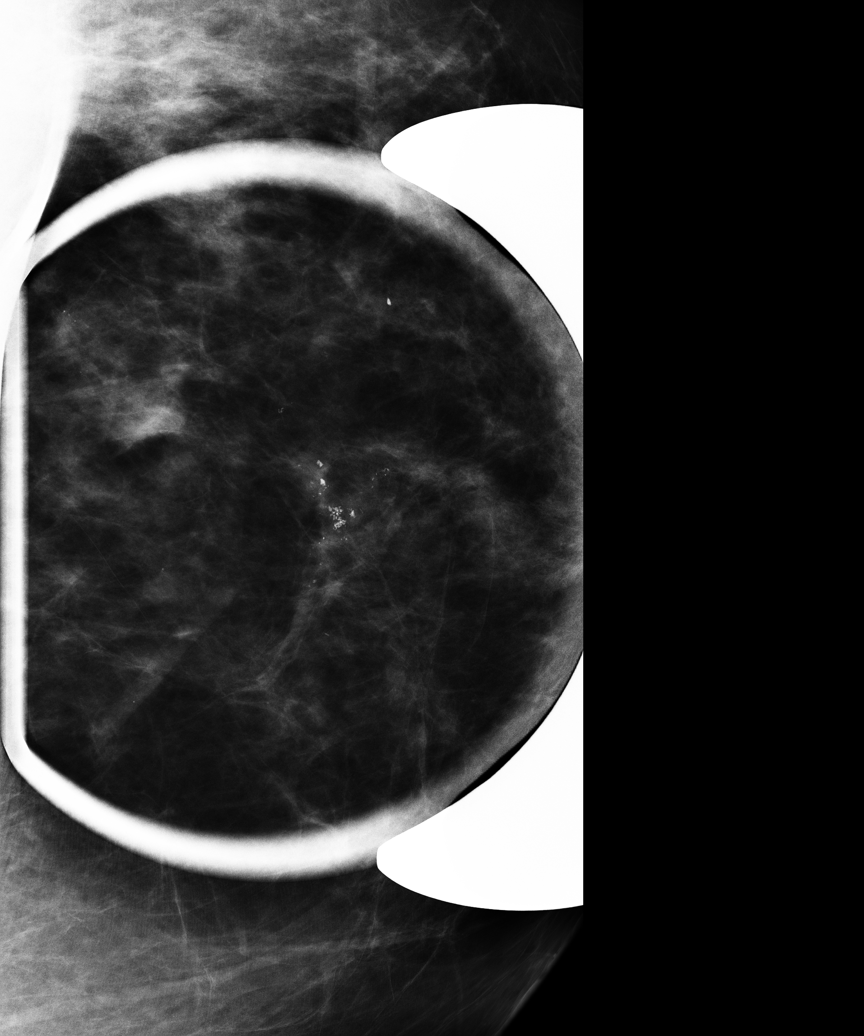
[im 3/6]
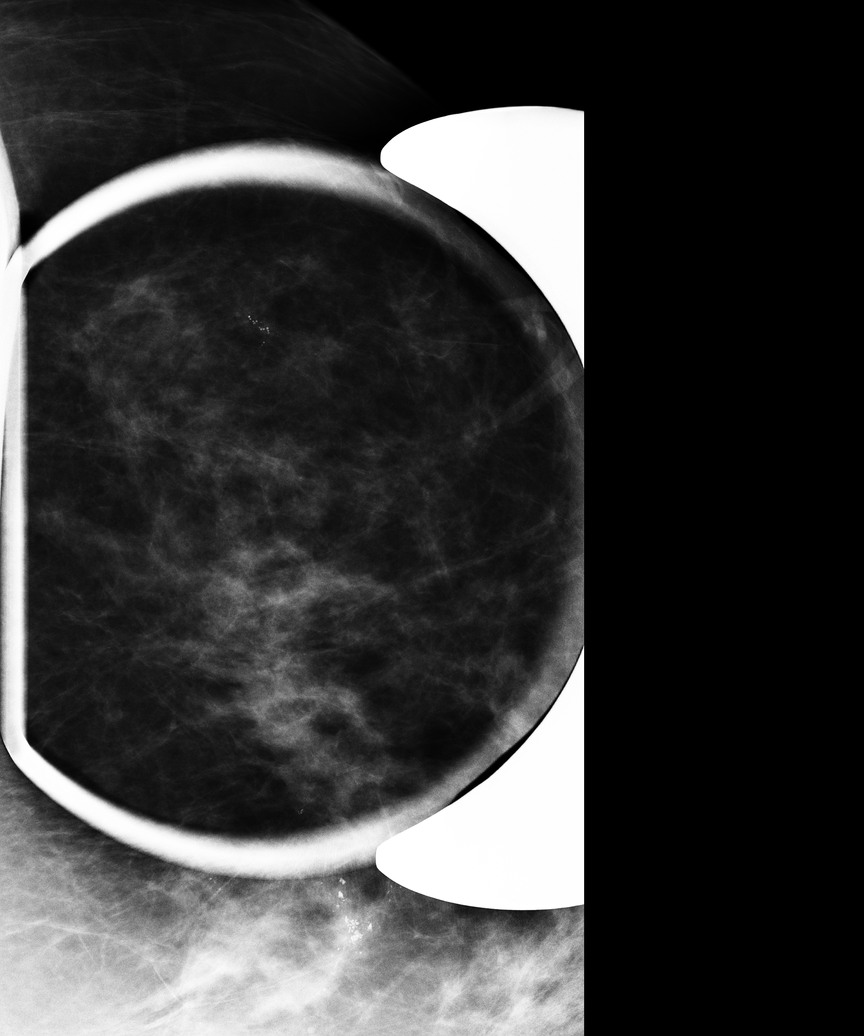
[im 4/6]
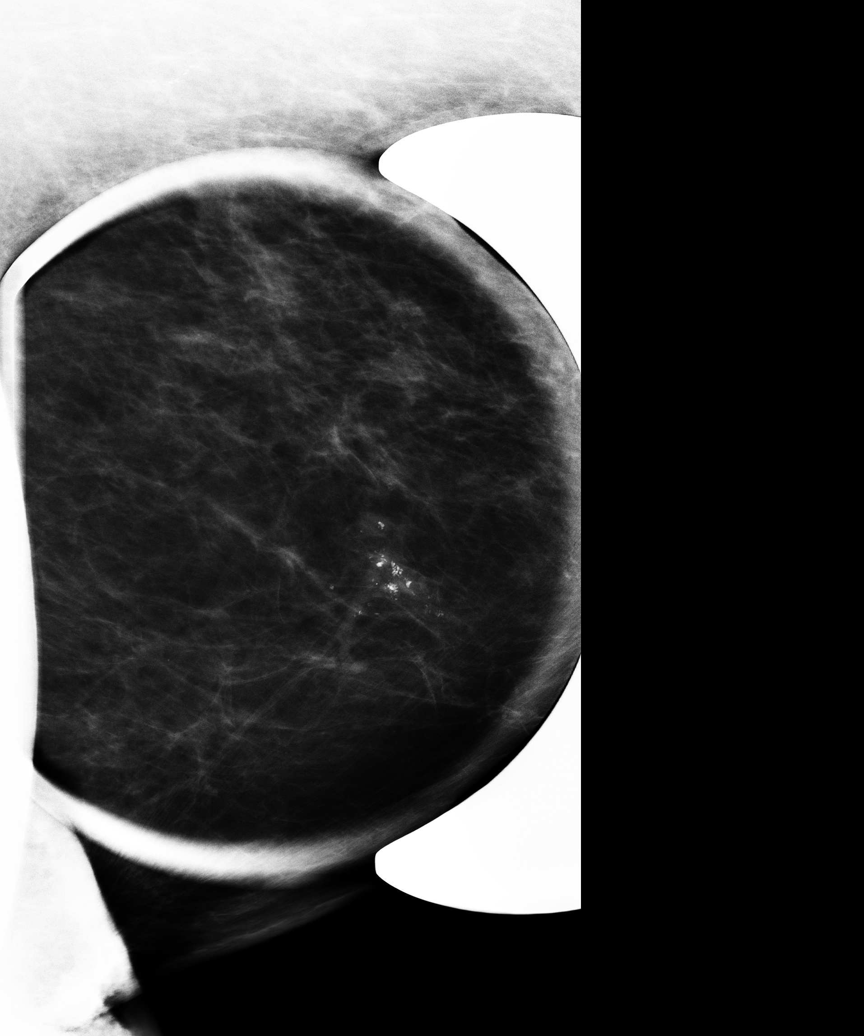
[im 5/6]
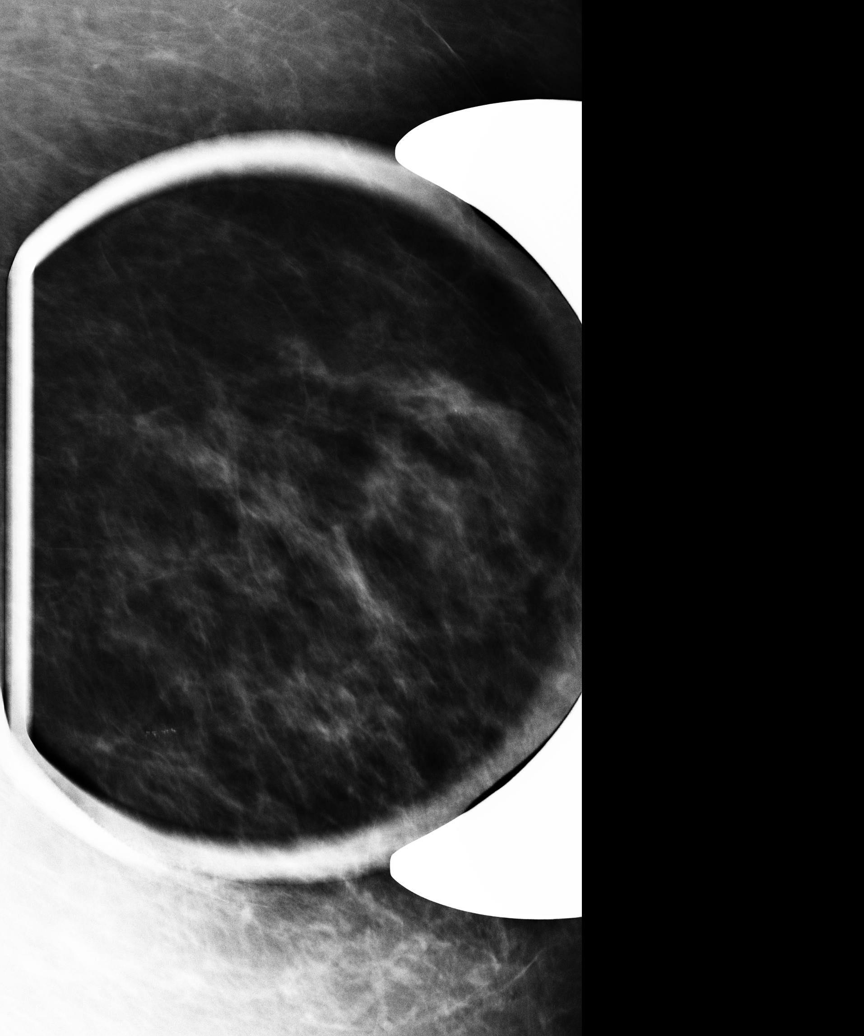
[im 6/6]
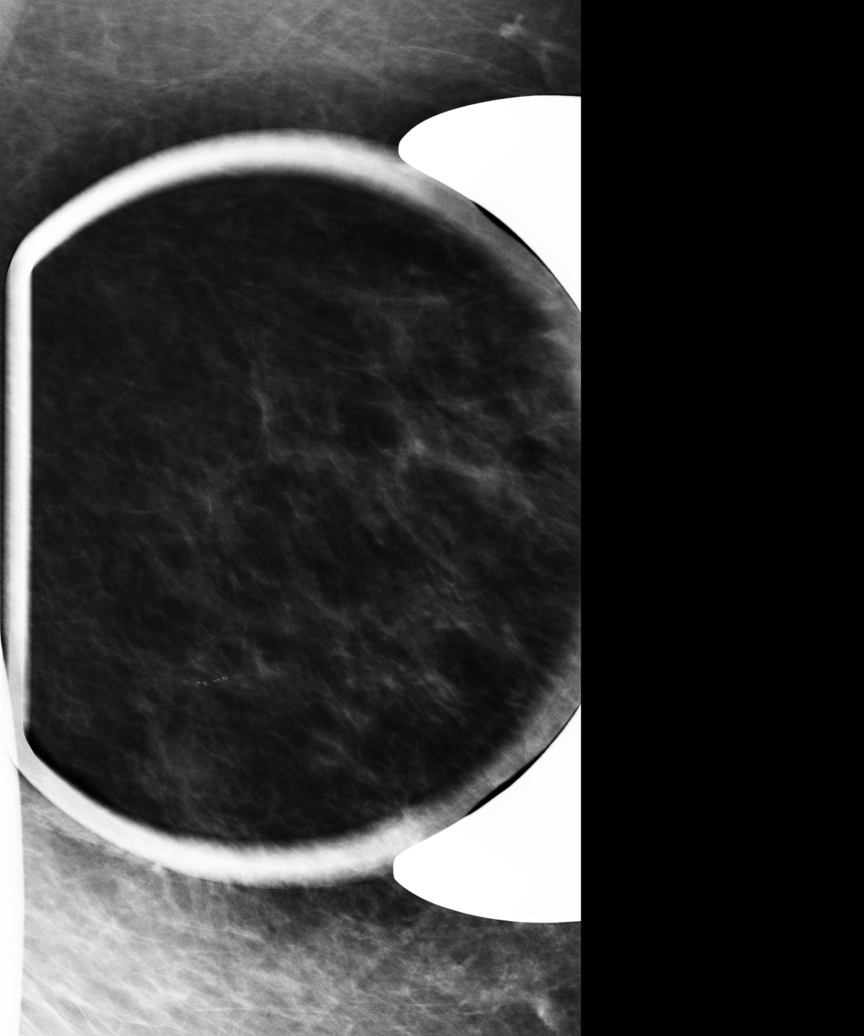

[6 of 6 positions shown; findings below may reference images not displayed]

FINDING: True lateral view and spot  magnification  views of the left breast were
performed. There is a small grouping of rounded microcalcifications in the
upper outer left breast unchanged compared with are [DATE].

There is a cluster of microcalcifications with associated coarse
calcifications in the inferior left breast at approximately the [DATE]
position which is to slightly increased compared with [DATE].
IMPRESSION: 1.     There is a cluster of microcalcifications with associated coarse
calcifications in the inferior left breast at approximately the [DATE]
position which is to slightly increased compared with [DATE]. Recommend
tissue diagnosis. At the very least a 6 month followup mammogram is
recommended.

BI-RADS:  Category 4 - Suspicious Abnormality

A negative mammogram report does not preclude biopsy or other evaluation of
a clinically palpable or otherwise suspicious mass or lesion. Breast cancer
may not be detected by mammography in up to 10% of cases.

[REDACTED]

## 2013-03-23 DIAGNOSIS — I82409 Acute embolism and thrombosis of unspecified deep veins of unspecified lower extremity: Secondary | ICD-10-CM

## 2013-03-23 HISTORY — PX: BREAST LUMPECTOMY W/ NEEDLE LOCALIZATION: SHX1266

## 2013-03-23 HISTORY — DX: Acute embolism and thrombosis of unspecified deep veins of unspecified lower extremity: I82.409

## 2013-08-17 DIAGNOSIS — M8580 Other specified disorders of bone density and structure, unspecified site: Secondary | ICD-10-CM | POA: Insufficient documentation

## 2013-08-17 DIAGNOSIS — I4891 Unspecified atrial fibrillation: Secondary | ICD-10-CM | POA: Insufficient documentation

## 2013-08-17 DIAGNOSIS — I10 Essential (primary) hypertension: Secondary | ICD-10-CM | POA: Insufficient documentation

## 2013-08-21 ENCOUNTER — Ambulatory Visit: Payer: Self-pay | Admitting: Internal Medicine

## 2013-08-21 DIAGNOSIS — C50919 Malignant neoplasm of unspecified site of unspecified female breast: Secondary | ICD-10-CM

## 2013-08-21 HISTORY — DX: Malignant neoplasm of unspecified site of unspecified female breast: C50.919

## 2013-08-21 IMAGING — US US BREAST*L* LIMITED INC AXILLA
1 series · 5 of 5 positions shown · non-contrast
Comparison: Previous exams.

ADDENDUM:
Biopsy scheduled for [REDACTED] [DATE] at 9 a.m..

Under the findings, it should read "associated" instead of "so
shaded" .
Measurements of the mass described at the 6 o'clock position 2 cm
from the nipple by ultrasound should read 0.5 x 0.7 x 1.1 cm.
CLINICAL DATA: Patient presents for a bilateral diagnostic
evaluation to followup left breast microcalcifications as well as to
evaluate a palpable abnormality over the 3 o'clock position of the
right breast felt by her physician.
EXAM:
DIGITAL DIAGNOSTIC  bilateral MAMMOGRAM WITH CAD
ULTRASOUND OF THE bilateral BREAST

[Series 1: us breast*left* limited inc axilla · 0.08mm/px · 5 of 5 slices shown]
[im 1/5]
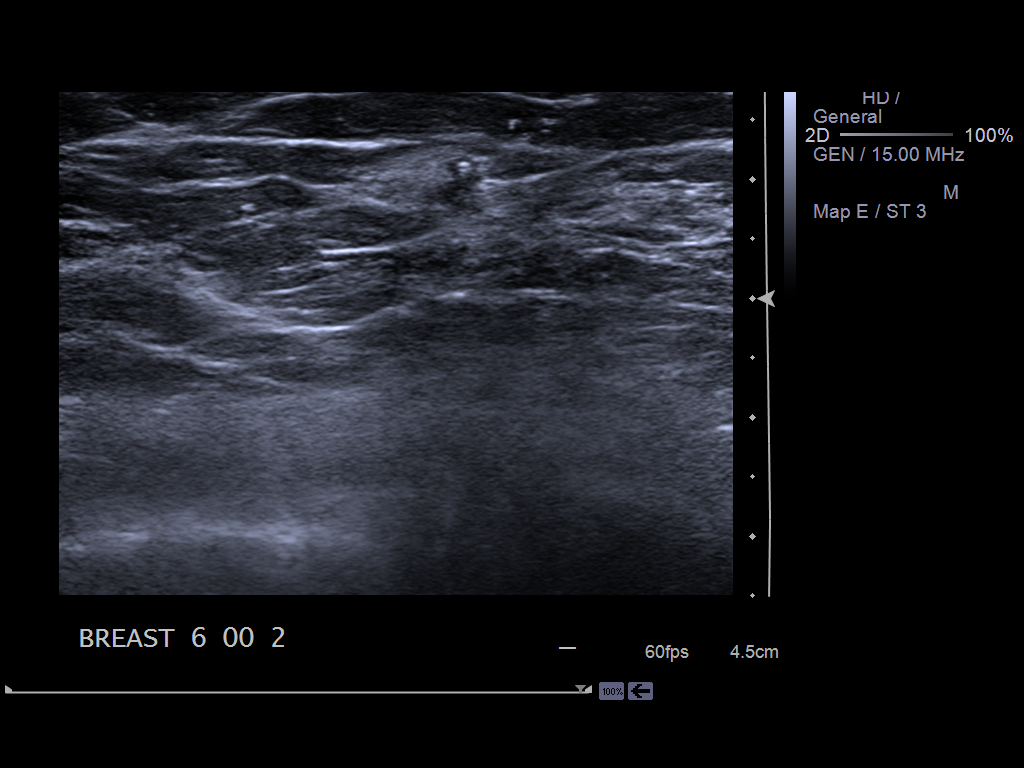
[im 2/5]
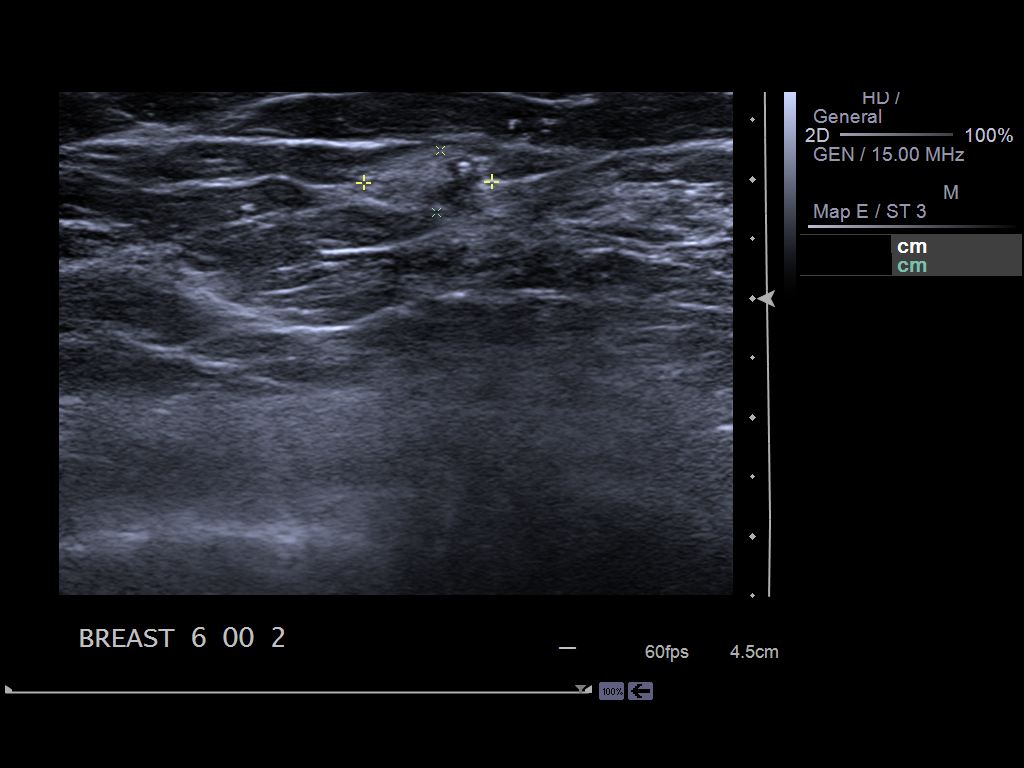
[im 3/5]
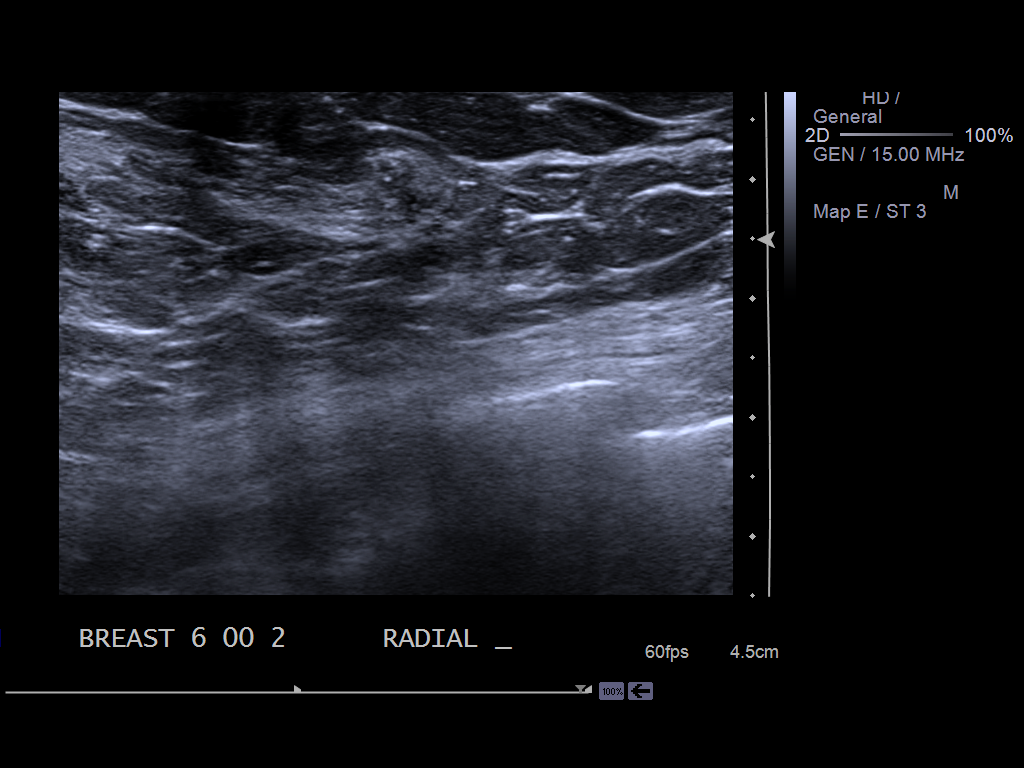
[im 4/5]
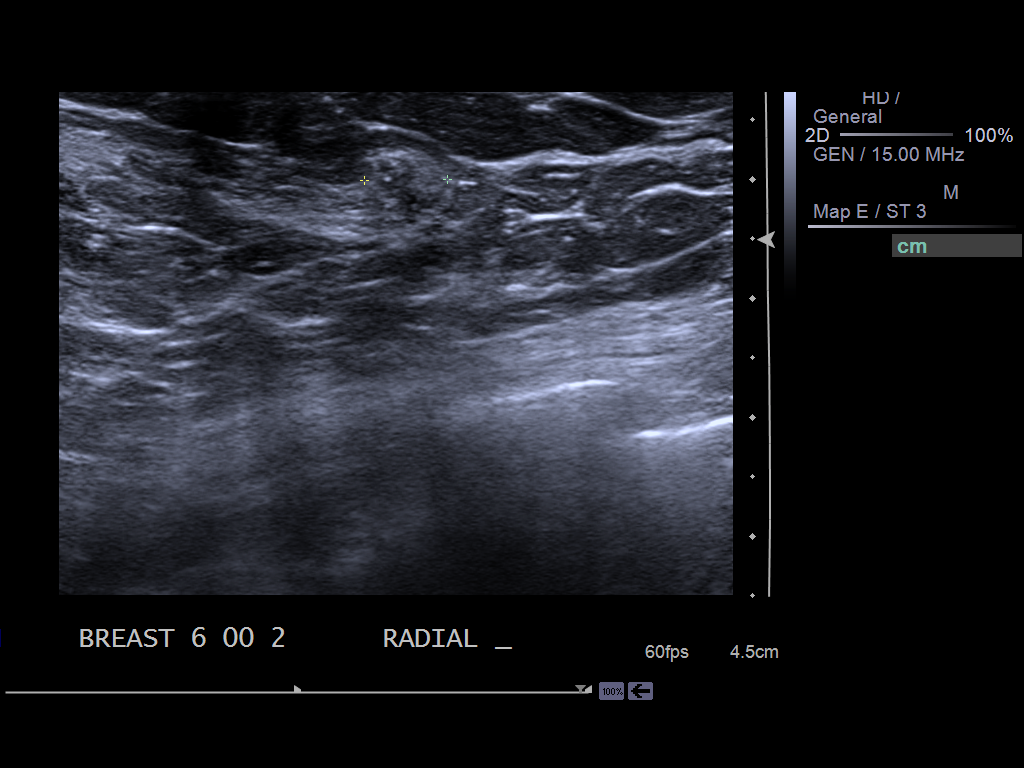
[im 5/5]
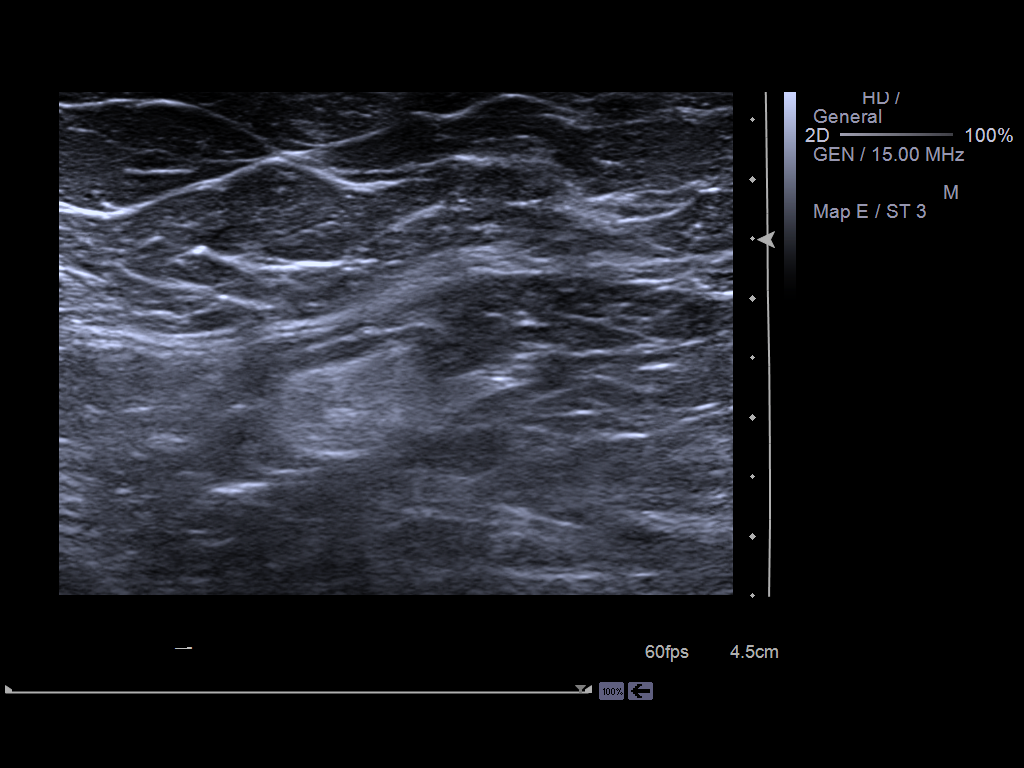

[5 of 5 positions shown; findings below may reference images not displayed]

ACR Breast Density Category b: There are scattered areas of
fibroglandular density.
FINDINGS: Examination demonstrates a stable 7 mm group of rounded
microcalcifications over the outer mid to lower left breast. There
is a 8 mm group of coarse heterogeneous microcalcifications with
possible associated mass over the 6 o'clock position of the left
breast. These calcifications have decreased number although the
possible so shaded mass appears new. There is no focal abnormality
over the 3 o'clock of the right breast correspond to patient's
palpable abnormality.

Mammographic images were processed with CAD.

On physical exam, I palpate a soft superficial 8 mm mass over the 3
o'clock position of the right breast approximately 8 cm from the
nipple. I palpate no focal abnormality over the 6 o'clock position
of the left breast.

Ultrasound is performed, showing an ovoid mass with indistinct
margins at the 6 o'clock position of the left breast 2 cm from the
nipple which is echogenic along the periphery and hypoechoic
centrally with microcalcifications. This measures 0.5 x 0.7 x 1
point cm. Ultrasound of the left axilla is unremarkable.

Ultrasound over the 3 o'clock position of the right breast
demonstrates an ovoid echogenic mass just below the skin measuring
0.4 x 0.9 x 1.2 cm compatible with a small lipoma.
IMPRESSION: Ovoid mass with indistinct margins at the 6 o'clock position of the
left breast 2 cm from the nipple with associated coarse
heterogeneous microcalcifications. This likely represents fat
necrosis although malignancy cannot be excluded.

Stable 7 mm group of typically benign microcalcifications over the
outer mid to lower left breast.

Findings compatible with a lipoma over the 3 o'clock position of the
right breast 8 cm from the nipple measuring 0.4 x 0.9 x 1.2 cm
corresponding to patient's palpable abnormality.

RECOMMENDATION:
Recommend ultrasound-guided core needle biopsy of the indeterminate
mass over the 6 o'clock position of the left breast. Also recommend
an additional followup diagnostic left breast mammogram in 1 year to
document 2 years of stability of the group of microcalcifications
over the outer mid to lower left breast.

I have discussed the findings and recommendations with the patient.
Results were also provided in writing at the conclusion of the
visit. If applicable, a reminder letter will be sent to the patient
regarding the next appointment.

BI-RADS CATEGORY  4: Suspicious.

## 2013-08-21 IMAGING — US US BREAST*R* LIMITED INC AXILLA
1 series · 4 of 4 positions shown · non-contrast
Comparison: Previous exams.

ADDENDUM:
Biopsy scheduled for [REDACTED] [DATE] at 9 a.m..

Under the findings, it should read "associated" instead of "so
shaded" .
Measurements of the mass described at the 6 o'clock position 2 cm
from the nipple by ultrasound should read 0.5 x 0.7 x 1.1 cm.
CLINICAL DATA: Patient presents for a bilateral diagnostic
evaluation to followup left breast microcalcifications as well as to
evaluate a palpable abnormality over the 3 o'clock position of the
right breast felt by her physician.
EXAM:
DIGITAL DIAGNOSTIC  bilateral MAMMOGRAM WITH CAD
ULTRASOUND OF THE bilateral BREAST

[Series 1: us breast*right* limited inc axilla · 0.08mm/px · 4 of 4 slices shown]
[im 1/4]
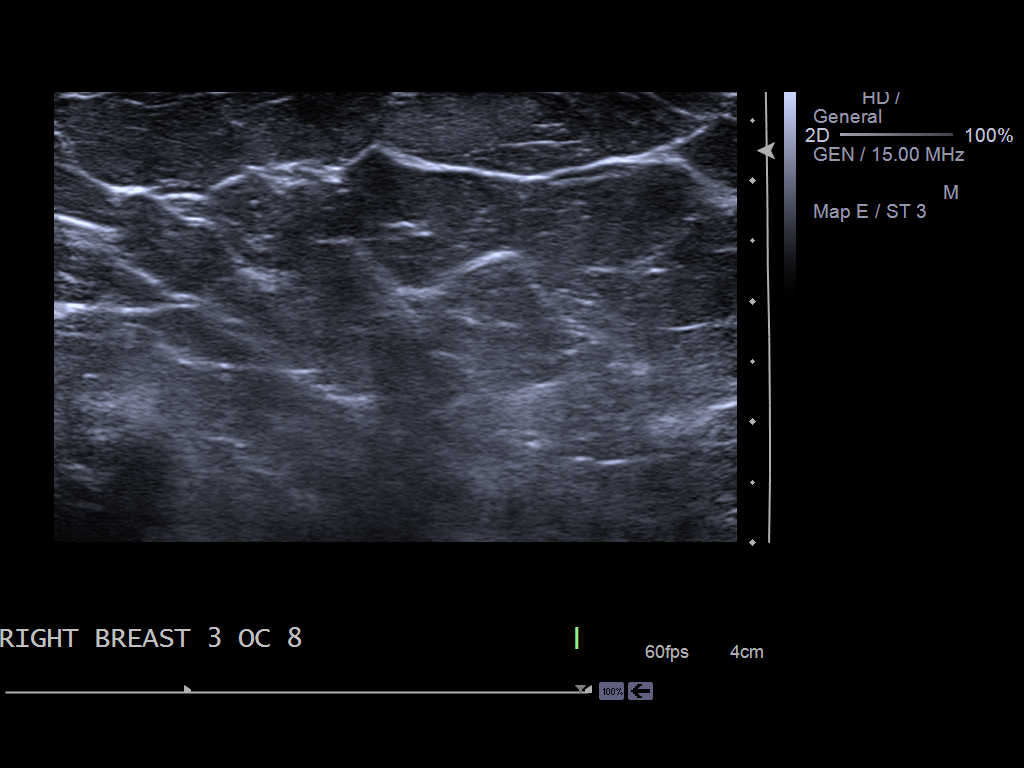
[im 2/4]
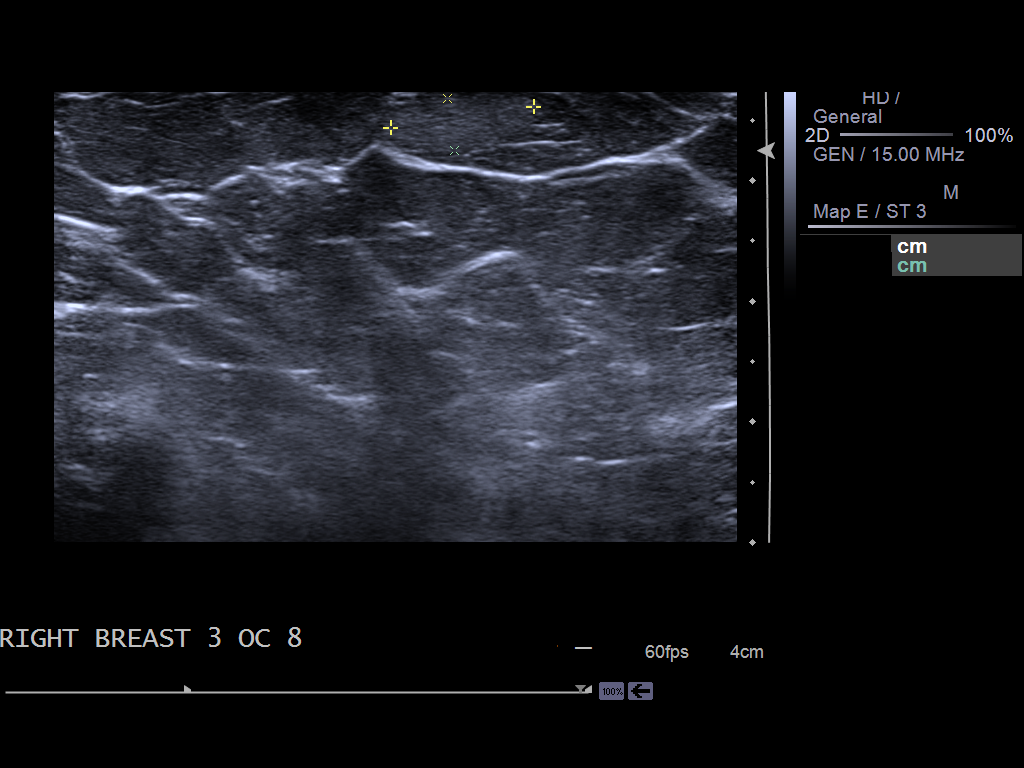
[im 3/4]
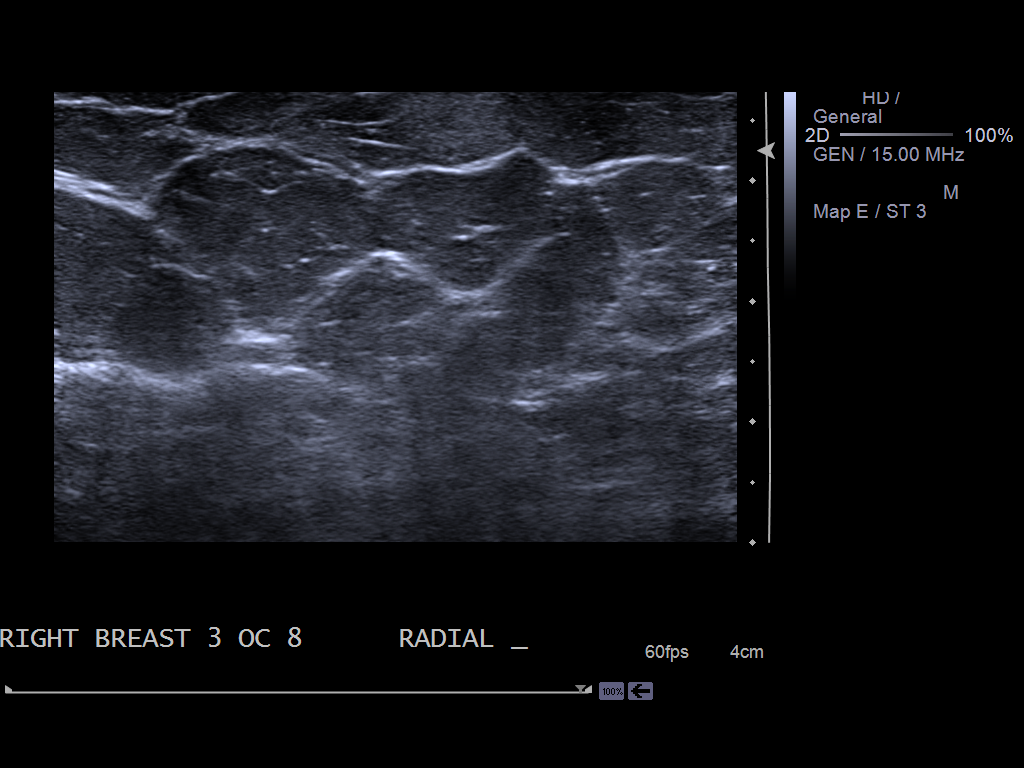
[im 4/4]
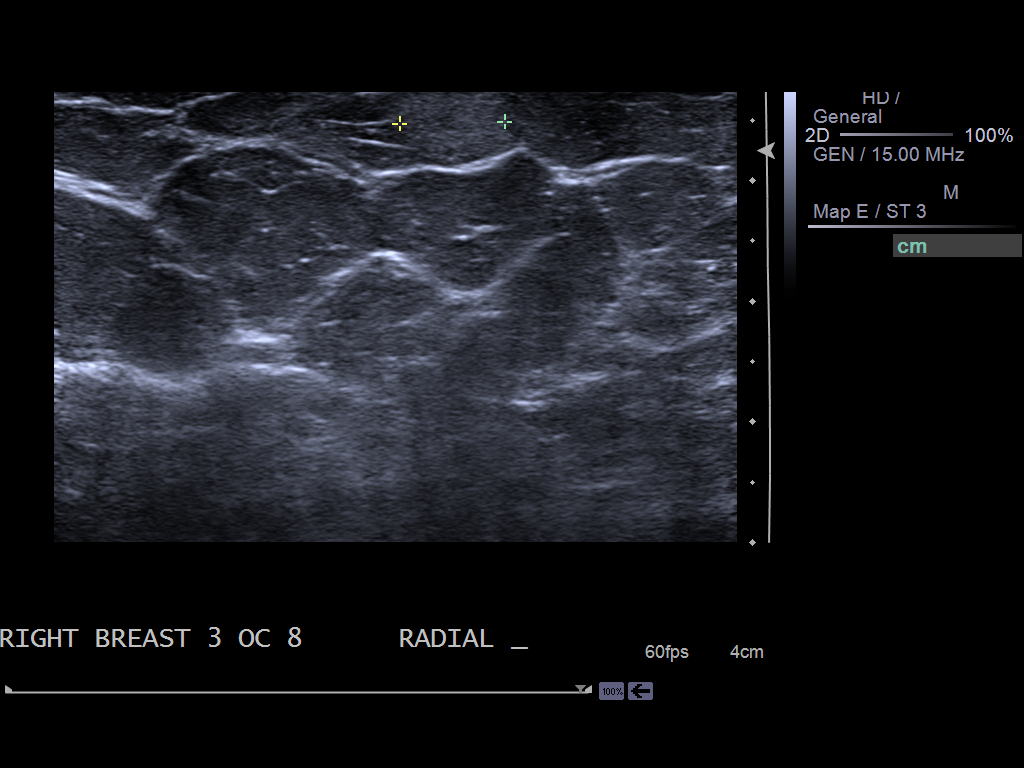

[4 of 4 positions shown; findings below may reference images not displayed]

ACR Breast Density Category b: There are scattered areas of
fibroglandular density.
FINDINGS: Examination demonstrates a stable 7 mm group of rounded
microcalcifications over the outer mid to lower left breast. There
is a 8 mm group of coarse heterogeneous microcalcifications with
possible associated mass over the 6 o'clock position of the left
breast. These calcifications have decreased number although the
possible so shaded mass appears new. There is no focal abnormality
over the 3 o'clock of the right breast correspond to patient's
palpable abnormality.

Mammographic images were processed with CAD.

On physical exam, I palpate a soft superficial 8 mm mass over the 3
o'clock position of the right breast approximately 8 cm from the
nipple. I palpate no focal abnormality over the 6 o'clock position
of the left breast.

Ultrasound is performed, showing an ovoid mass with indistinct
margins at the 6 o'clock position of the left breast 2 cm from the
nipple which is echogenic along the periphery and hypoechoic
centrally with microcalcifications. This measures 0.5 x 0.7 x 1
point cm. Ultrasound of the left axilla is unremarkable.

Ultrasound over the 3 o'clock position of the right breast
demonstrates an ovoid echogenic mass just below the skin measuring
0.4 x 0.9 x 1.2 cm compatible with a small lipoma.
IMPRESSION: Ovoid mass with indistinct margins at the 6 o'clock position of the
left breast 2 cm from the nipple with associated coarse
heterogeneous microcalcifications. This likely represents fat
necrosis although malignancy cannot be excluded.

Stable 7 mm group of typically benign microcalcifications over the
outer mid to lower left breast.

Findings compatible with a lipoma over the 3 o'clock position of the
right breast 8 cm from the nipple measuring 0.4 x 0.9 x 1.2 cm
corresponding to patient's palpable abnormality.

RECOMMENDATION:
Recommend ultrasound-guided core needle biopsy of the indeterminate
mass over the 6 o'clock position of the left breast. Also recommend
an additional followup diagnostic left breast mammogram in 1 year to
document 2 years of stability of the group of microcalcifications
over the outer mid to lower left breast.

I have discussed the findings and recommendations with the patient.
Results were also provided in writing at the conclusion of the
visit. If applicable, a reminder letter will be sent to the patient
regarding the next appointment.

BI-RADS CATEGORY  4: Suspicious.

## 2013-08-21 IMAGING — MG MM DIGITAL DIAGNOSTIC BILAT W/ CAD
1 series · 7 of 7 positions shown · non-contrast
Comparison: Previous exams.

ADDENDUM:
Biopsy scheduled for [REDACTED] [DATE] at 9 a.m..

Under the findings, it should read "associated" instead of "so
shaded" .
Measurements of the mass described at the 6 o'clock position 2 cm
from the nipple by ultrasound should read 0.5 x 0.7 x 1.1 cm.
CLINICAL DATA: Patient presents for a bilateral diagnostic
evaluation to followup left breast microcalcifications as well as to
evaluate a palpable abnormality over the 3 o'clock position of the
right breast felt by her physician.
EXAM:
DIGITAL DIAGNOSTIC  bilateral MAMMOGRAM WITH CAD
ULTRASOUND OF THE bilateral BREAST

[R CC · right · 7 of 7 slices shown]
[im 1/7]
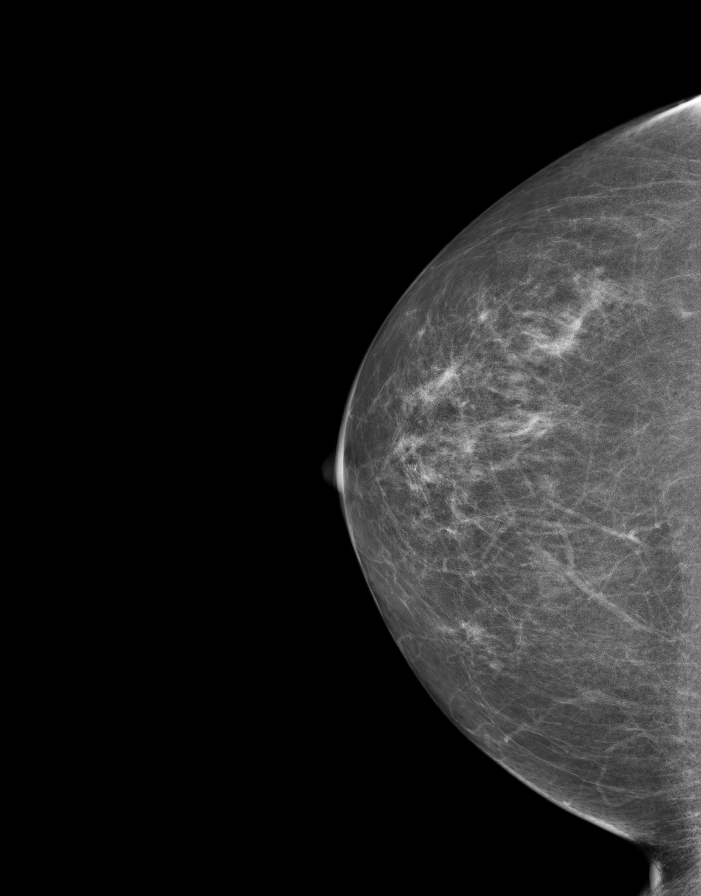
[im 2/7]
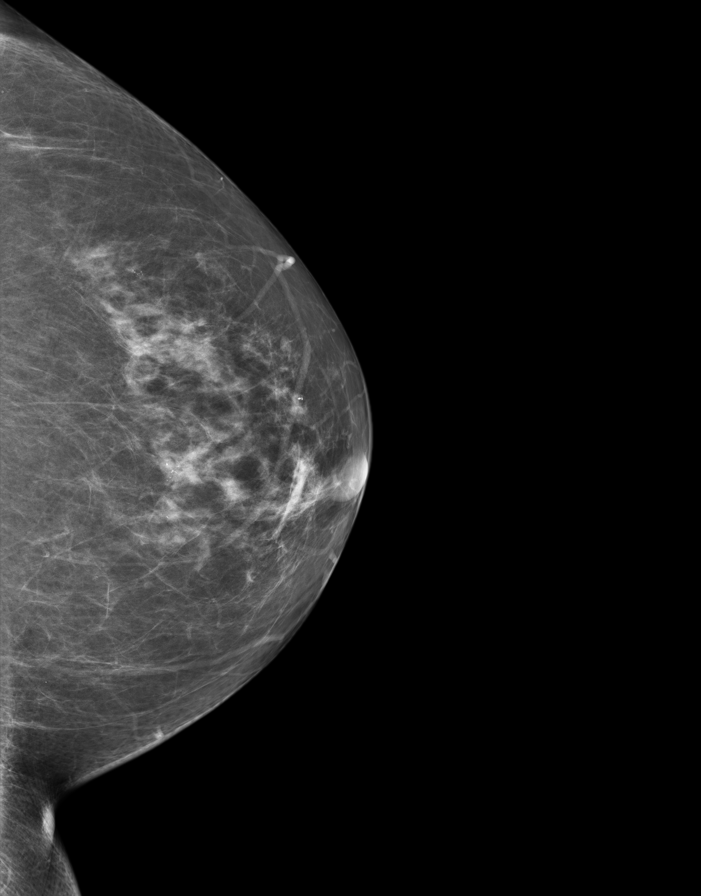
[im 3/7]
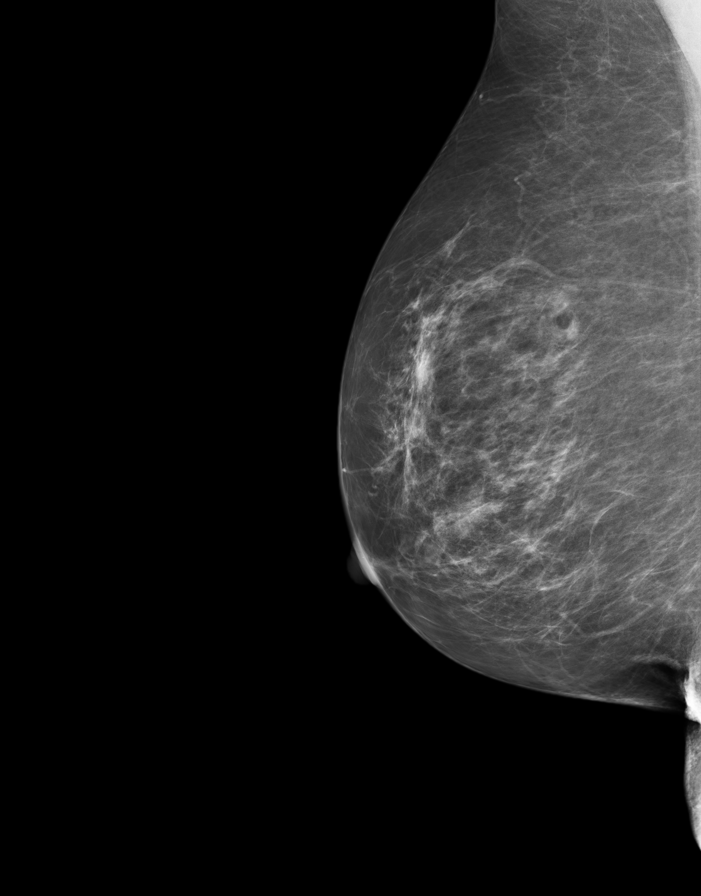
[im 4/7]
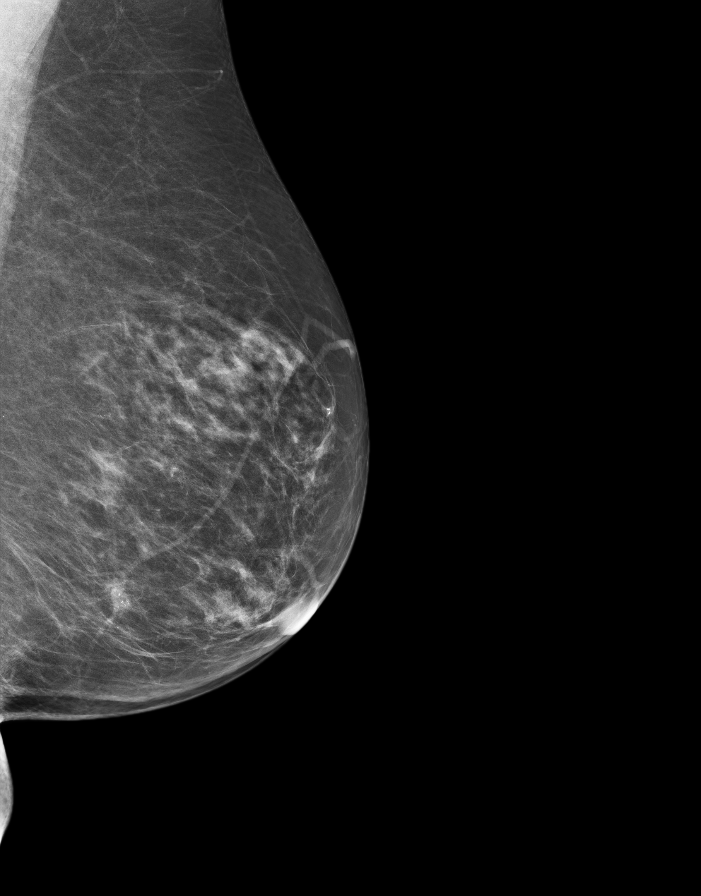
[im 5/7]
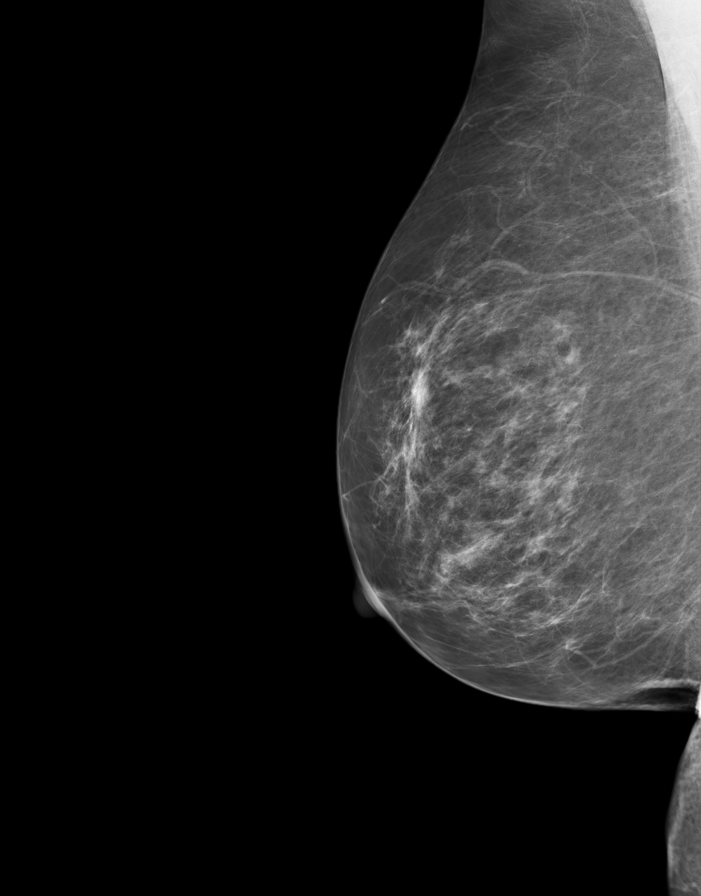
[im 6/7]
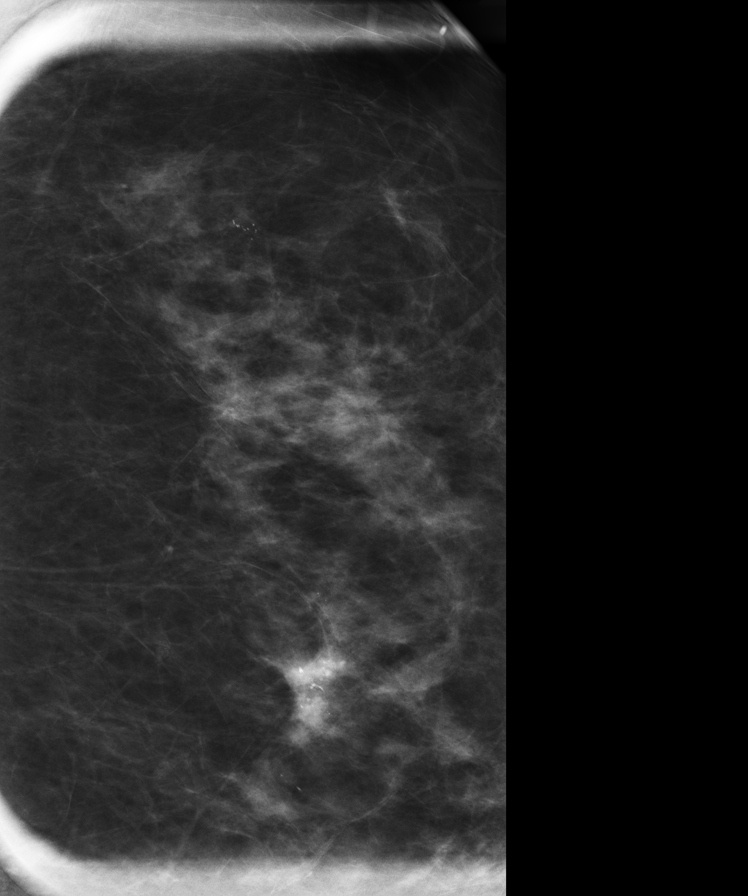
[im 7/7]
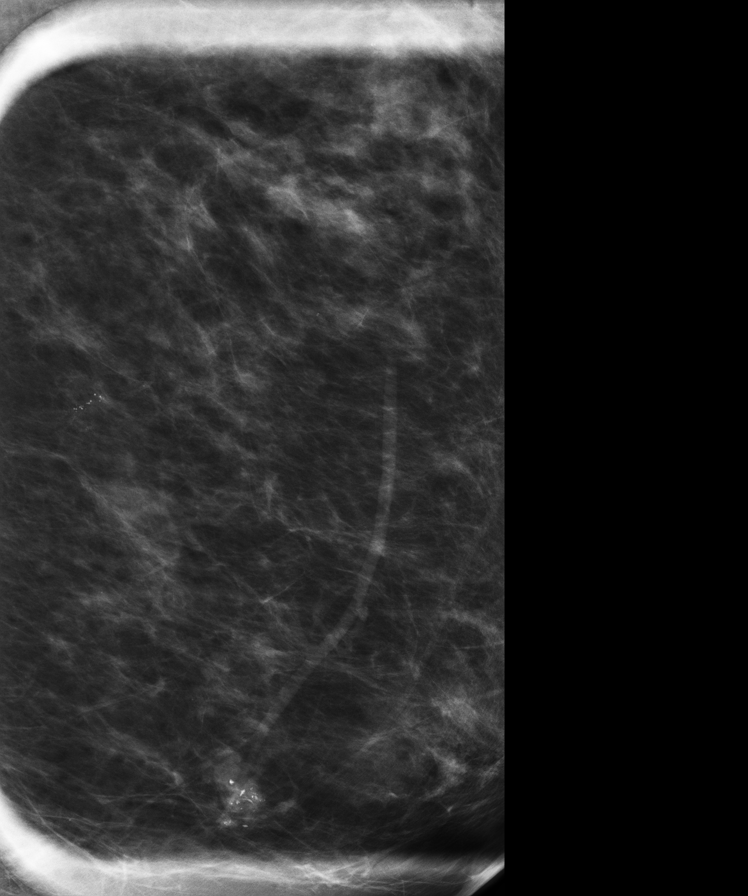

[7 of 7 positions shown; findings below may reference images not displayed]

ACR Breast Density Category b: There are scattered areas of
fibroglandular density.
FINDINGS: Examination demonstrates a stable 7 mm group of rounded
microcalcifications over the outer mid to lower left breast. There
is a 8 mm group of coarse heterogeneous microcalcifications with
possible associated mass over the 6 o'clock position of the left
breast. These calcifications have decreased number although the
possible so shaded mass appears new. There is no focal abnormality
over the 3 o'clock of the right breast correspond to patient's
palpable abnormality.

Mammographic images were processed with CAD.

On physical exam, I palpate a soft superficial 8 mm mass over the 3
o'clock position of the right breast approximately 8 cm from the
nipple. I palpate no focal abnormality over the 6 o'clock position
of the left breast.

Ultrasound is performed, showing an ovoid mass with indistinct
margins at the 6 o'clock position of the left breast 2 cm from the
nipple which is echogenic along the periphery and hypoechoic
centrally with microcalcifications. This measures 0.5 x 0.7 x 1
point cm. Ultrasound of the left axilla is unremarkable.

Ultrasound over the 3 o'clock position of the right breast
demonstrates an ovoid echogenic mass just below the skin measuring
0.4 x 0.9 x 1.2 cm compatible with a small lipoma.
IMPRESSION: Ovoid mass with indistinct margins at the 6 o'clock position of the
left breast 2 cm from the nipple with associated coarse
heterogeneous microcalcifications. This likely represents fat
necrosis although malignancy cannot be excluded.

Stable 7 mm group of typically benign microcalcifications over the
outer mid to lower left breast.

Findings compatible with a lipoma over the 3 o'clock position of the
right breast 8 cm from the nipple measuring 0.4 x 0.9 x 1.2 cm
corresponding to patient's palpable abnormality.

RECOMMENDATION:
Recommend ultrasound-guided core needle biopsy of the indeterminate
mass over the 6 o'clock position of the left breast. Also recommend
an additional followup diagnostic left breast mammogram in 1 year to
document 2 years of stability of the group of microcalcifications
over the outer mid to lower left breast.

I have discussed the findings and recommendations with the patient.
Results were also provided in writing at the conclusion of the
visit. If applicable, a reminder letter will be sent to the patient
regarding the next appointment.

BI-RADS CATEGORY  4: Suspicious.

## 2013-08-25 ENCOUNTER — Ambulatory Visit: Payer: Self-pay | Admitting: Internal Medicine

## 2013-08-25 HISTORY — PX: BREAST BIOPSY: SHX20

## 2013-08-25 IMAGING — MG MM POST US BIOPSY *L*
1 series · 2 of 2 positions shown · non-contrast
Comparison: Previous exams

ADDENDUM:
Pathologic results have become available and indicate high grade
DCIS with an invasive mammary component, concordant with the imaging
findings. Surgical consultation is therefore necessary, and the
patient has an appointment with Dr. BRAMWELL for her by her
referring physician, who is aware of these results. I discussed the
results with the patient by phone on [REDACTED] at [4R]. She indicated
no complaints or complications related to the biopsy.

It should be noted that the patient has a second cluster of probably
benign calcifications in another quadrant of the left breast, which
currently have been stable for one year, and for which a followup
diagnostic mammogram has been recommended for twelve months. As I
discussed with BRAMWELL, Dr. BRAMWELL nurse, at [4R] on [DATE], these
should be biopsied under stereotactic guidance to confirm a benign
process prior to proceeding with surgery. She indicated that the
patient is scheduled for an office visit on [DATE].
CLINICAL DATA: Left breast mass
EXAM:
DIAGNOSTIC LEFT MAMMOGRAM POST ULTRASOUND BIOPSY

[L CC · left · 2 of 2 slices shown]
[im 1/2]
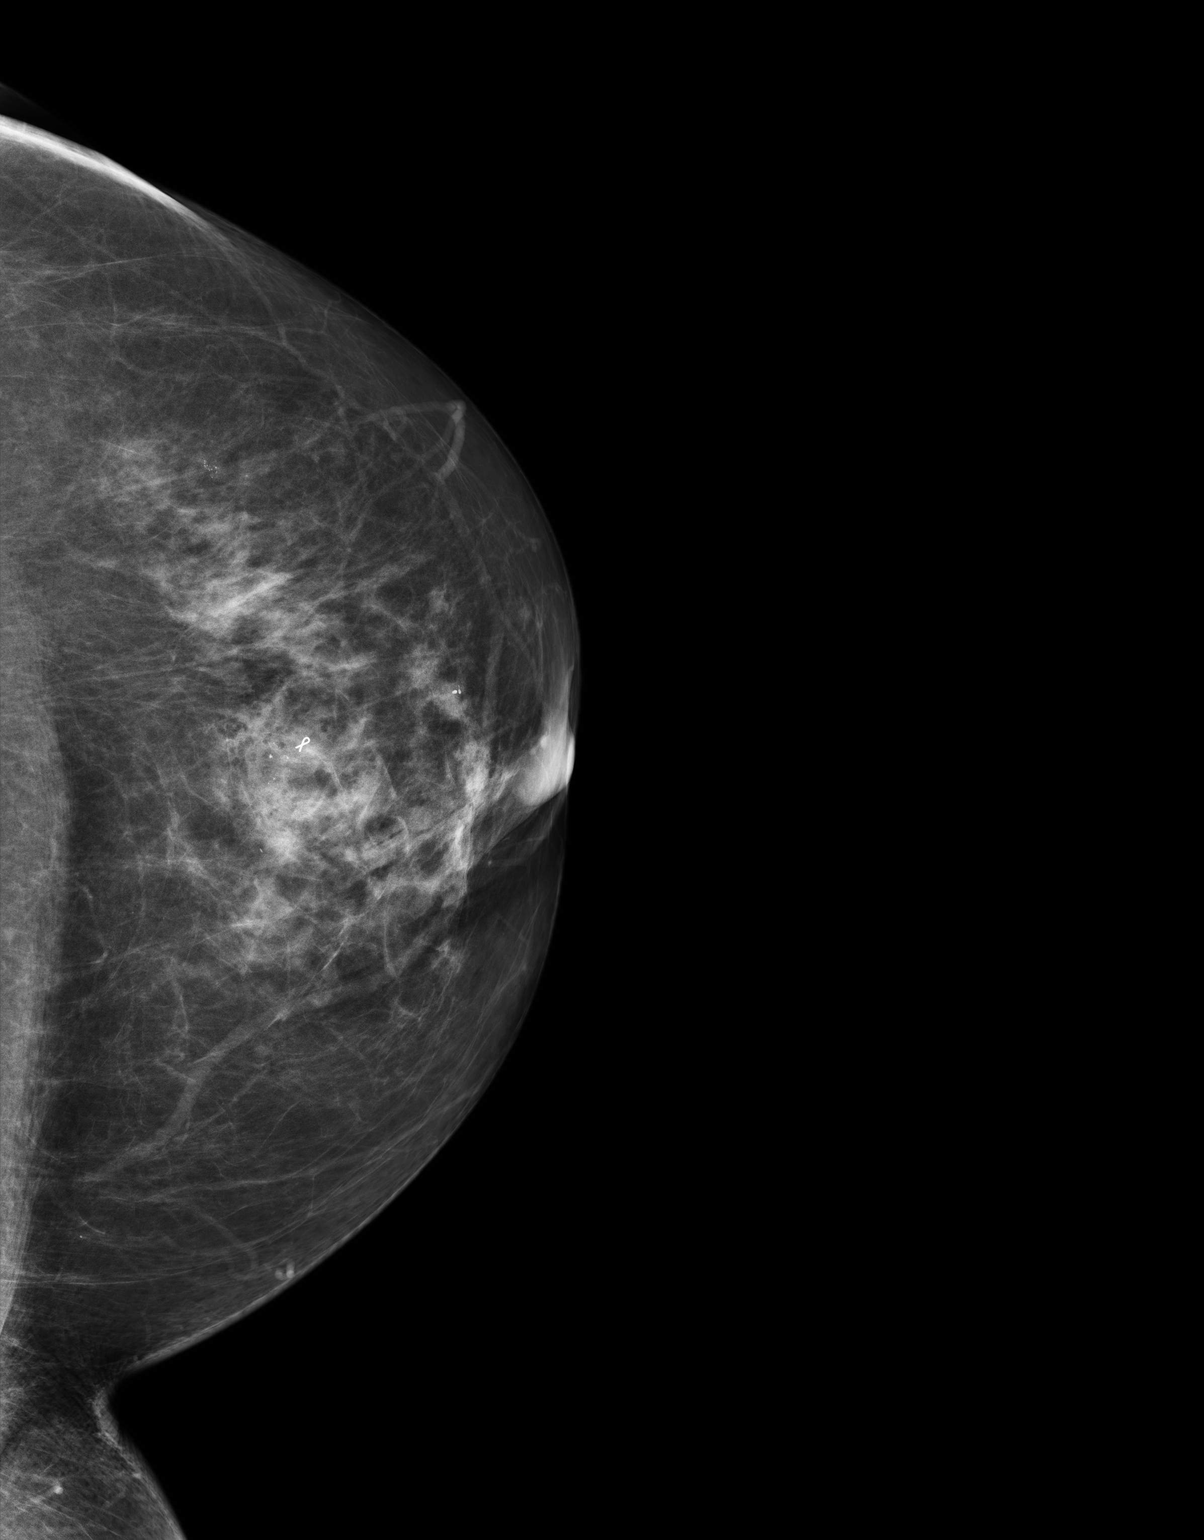
[im 2/2]
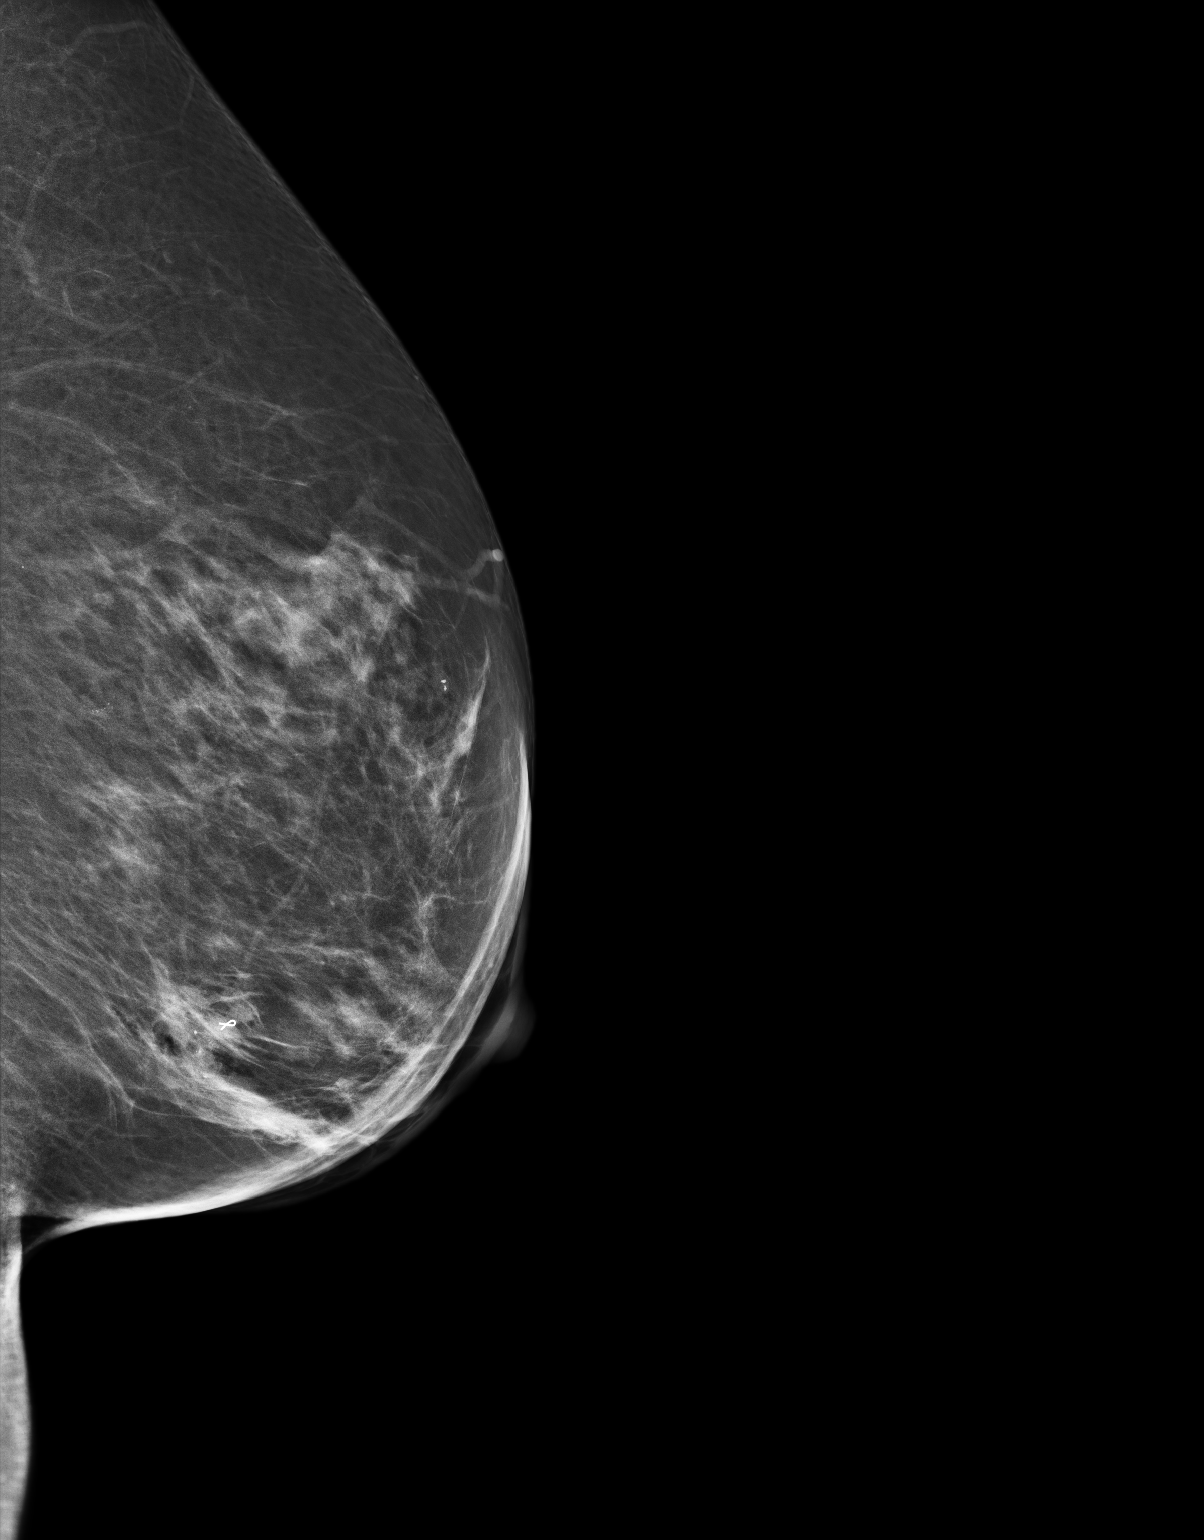

[2 of 2 positions shown; findings below may reference images not displayed]

FINDINGS: Mammographic images were obtained following ultrasound guided biopsy
of mass in the 6 o'clock position of the left breast. Mammographic
images demonstrate ribbon shaped marker clip projecting over the
mass in the 6 o'clock position.
IMPRESSION: Appropriate clip positioning

Final Assessment: Post Procedure Mammograms for Marker Placement

## 2013-08-25 IMAGING — US US BIOPSY BREAST CORE W/ IMAGING
1 series · 5 of 5 positions shown · non-contrast
Comparison: Previous exams.

CLINICAL DATA: Mass 6 o'clock position left breast

EXAM:
ULTRASOUND GUIDED LEFT BREAST CORE NEEDLE BIOPSY

[Series 1: us biopsy breast core w/ imaging · 0.08mm/px · 5 of 5 slices shown]
[im 1/5]
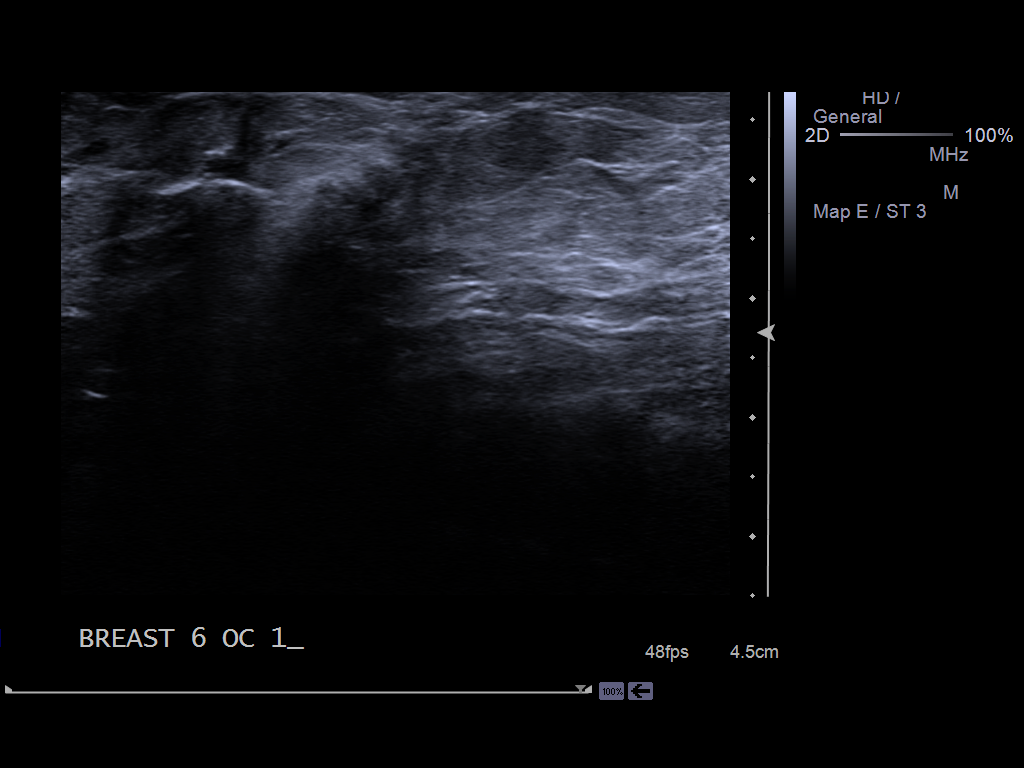
[im 2/5]
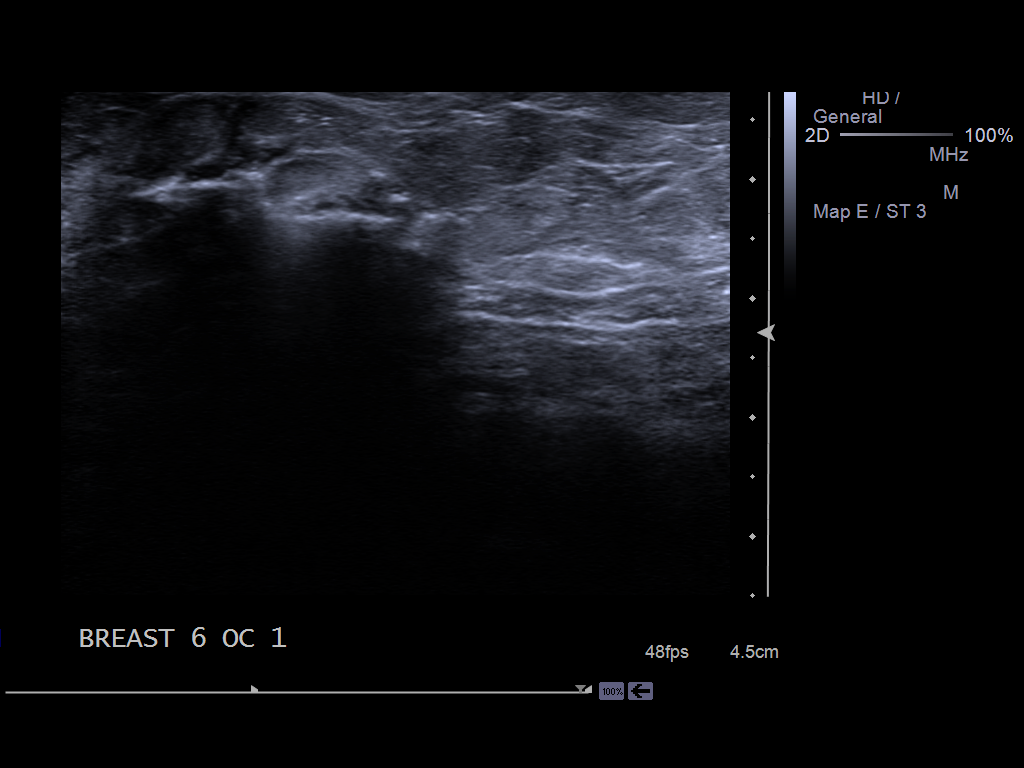
[im 3/5]
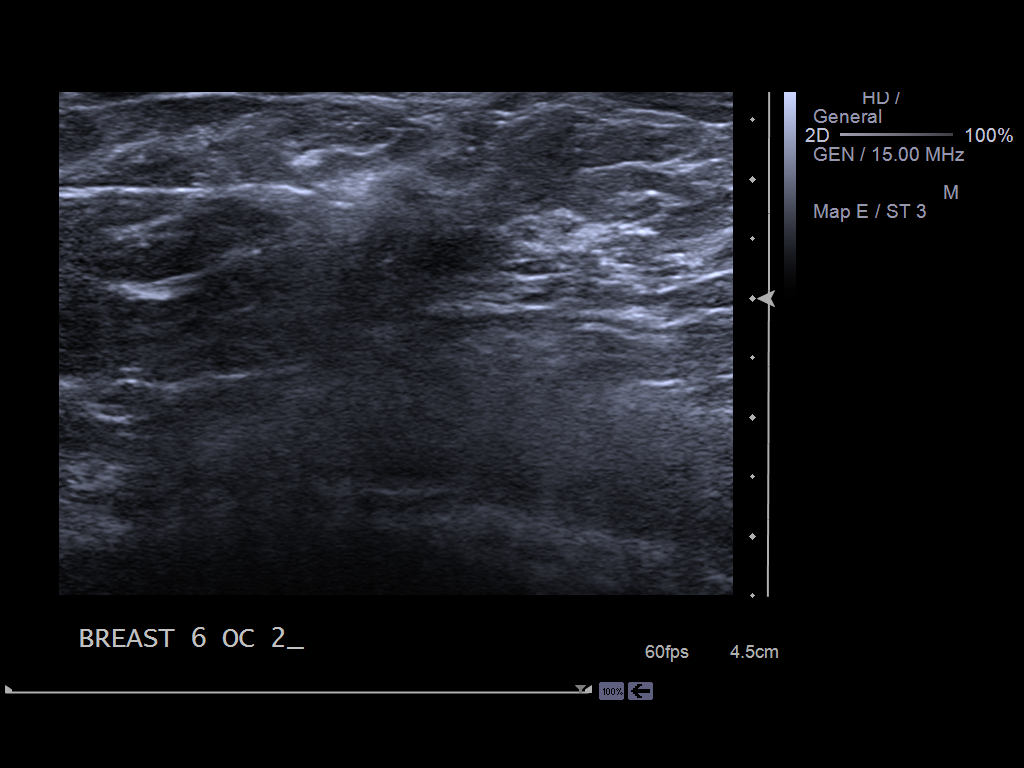
[im 4/5]
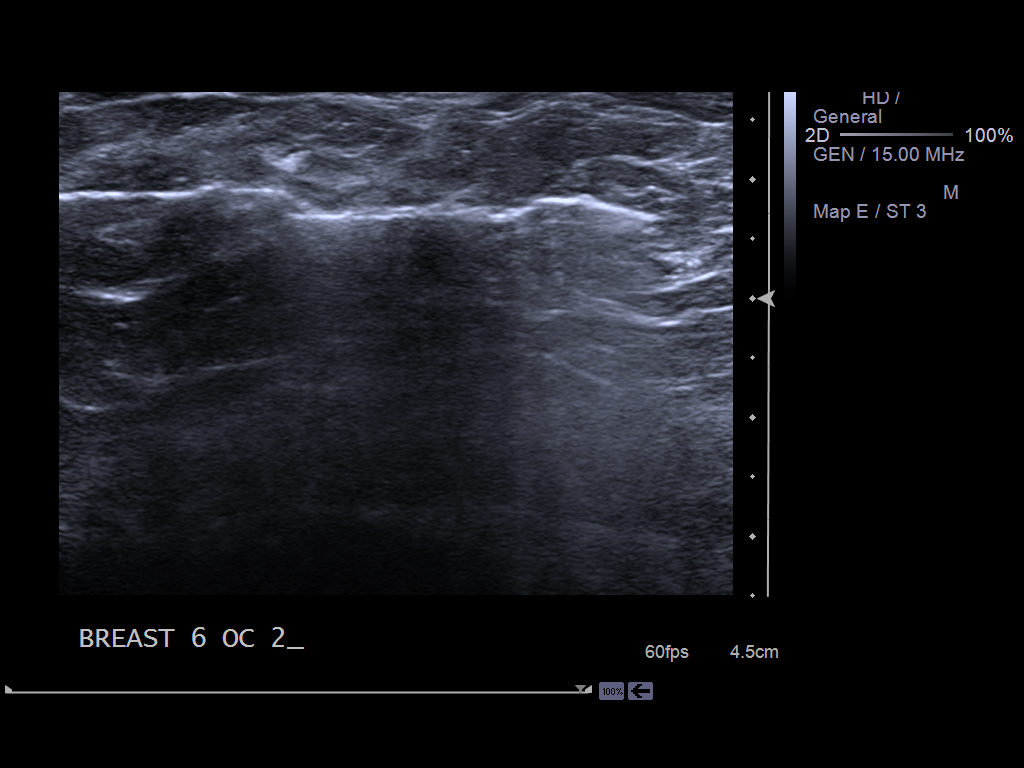
[im 5/5]
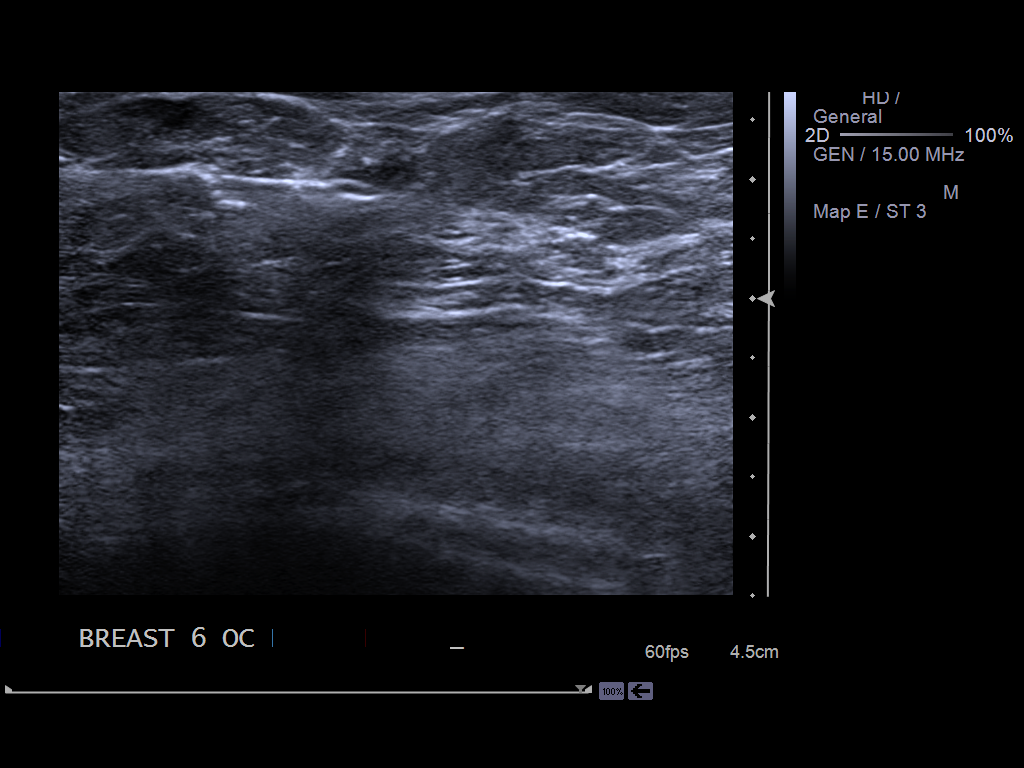

[5 of 5 positions shown; findings below may reference images not displayed]

PROCEDURE:
I met with the patient and we discussed the procedure of
ultrasound-guided biopsy, including benefits and alternatives. We
discussed the high likelihood of a successful procedure. We
discussed the risks of the procedure including infection, bleeding,
tissue injury, clip migration, and inadequate sampling. Informed
written consent was given. The usual time-out protocol was performed
immediately prior to the procedure.

Using sterile technique and 2% Lidocaine as local anesthetic, under
direct ultrasound visualization, a 12 gauge vacuum-assisteddevice
was used to perform biopsy of the left breast mass using a medial
lateral approach. At the conclusion of the procedure, a ribbon
shaped tissue marker clip was deployed into the biopsy cavity.
Follow-up 2-view mammogram was performed and dictated separately.
IMPRESSION: Ultrasound-guided biopsy of left breast mass. No apparent
complications.

## 2013-08-31 LAB — PATHOLOGY REPORT

## 2013-09-04 ENCOUNTER — Ambulatory Visit: Payer: Self-pay | Admitting: Surgery

## 2013-09-04 HISTORY — PX: BREAST BIOPSY: SHX20

## 2013-09-04 IMAGING — MG MM BREAST BX W LOC DEV 1ST LESION IMAGE BX SPEC STEREO GUIDE
2 series · 4 of 4 positions shown · non-contrast
Comparison: Previous exams.

ADDENDUM:
Pathologic results have become available and indicate columnar cell
change with sclerosing adenosis. This is concordant with the imaging
findings. The patient may now proceed with planned lumpectomy for
the malignant cluster of calcifications. The patient and I discussed
this by telephone on [DATE] at [XA] hours. The patient indicated
no complaints or complications related to the biopsy.
CLINICAL DATA: Patient has known malignancy in the lower central
portion of the left breast based on recent ultrasound-guided core
biopsy. Request is made for stereotactic guided core biopsy of group
of calcifications in the upper-outer quadrant of the left breast.

EXAM:
LEFT BREAST STEREOTACTIC CORE NEEDLE BIOPSY

[L CC · left · 2 of 2 slices shown (1 of 2)]
[im 1/2]
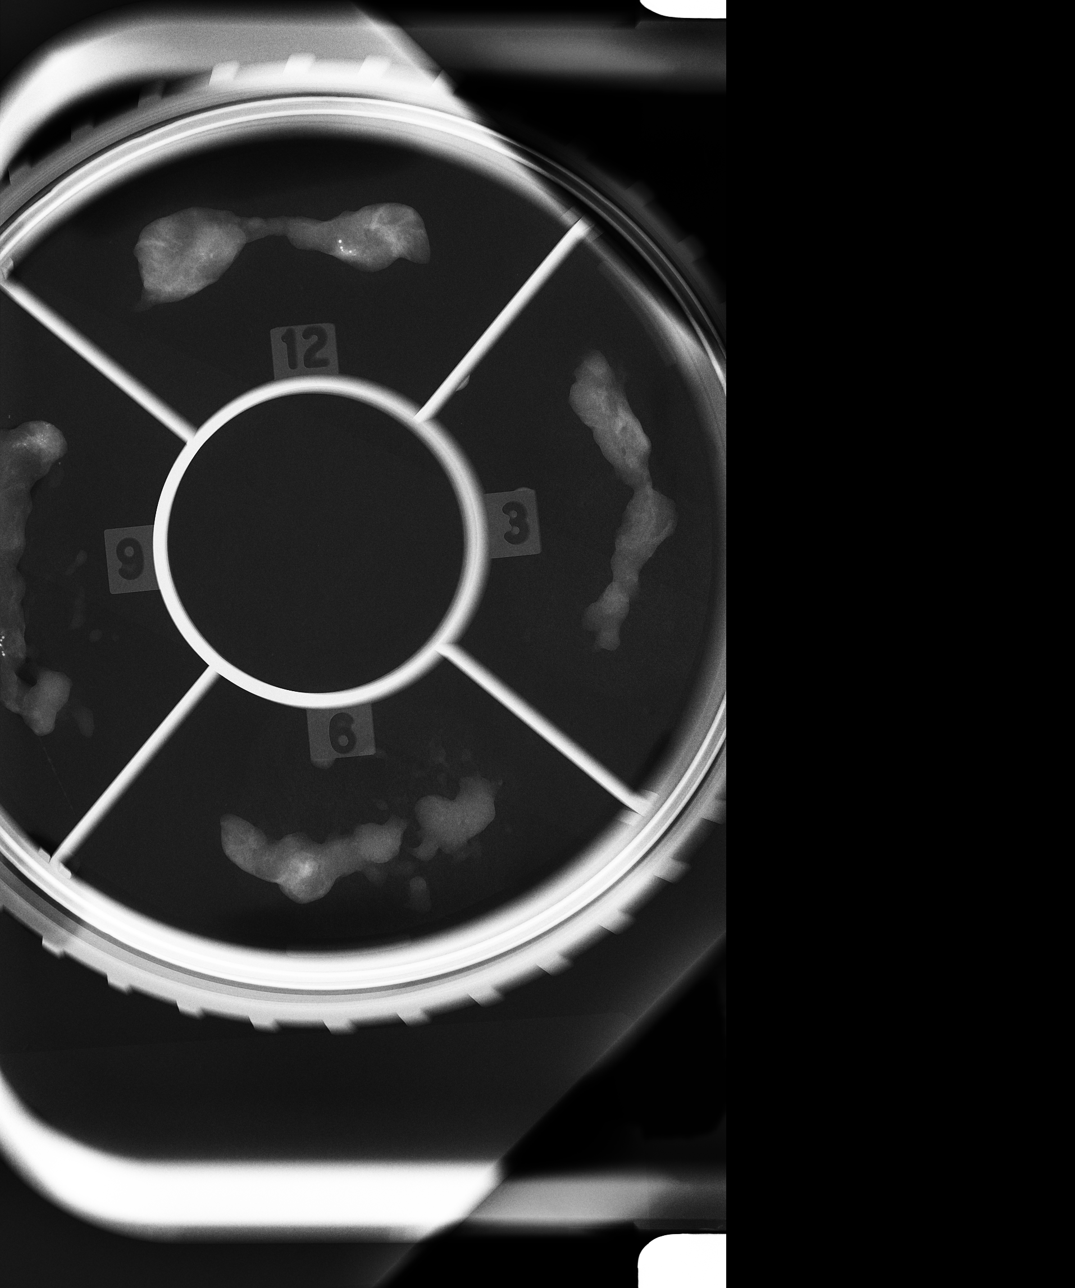
[im 2/2]
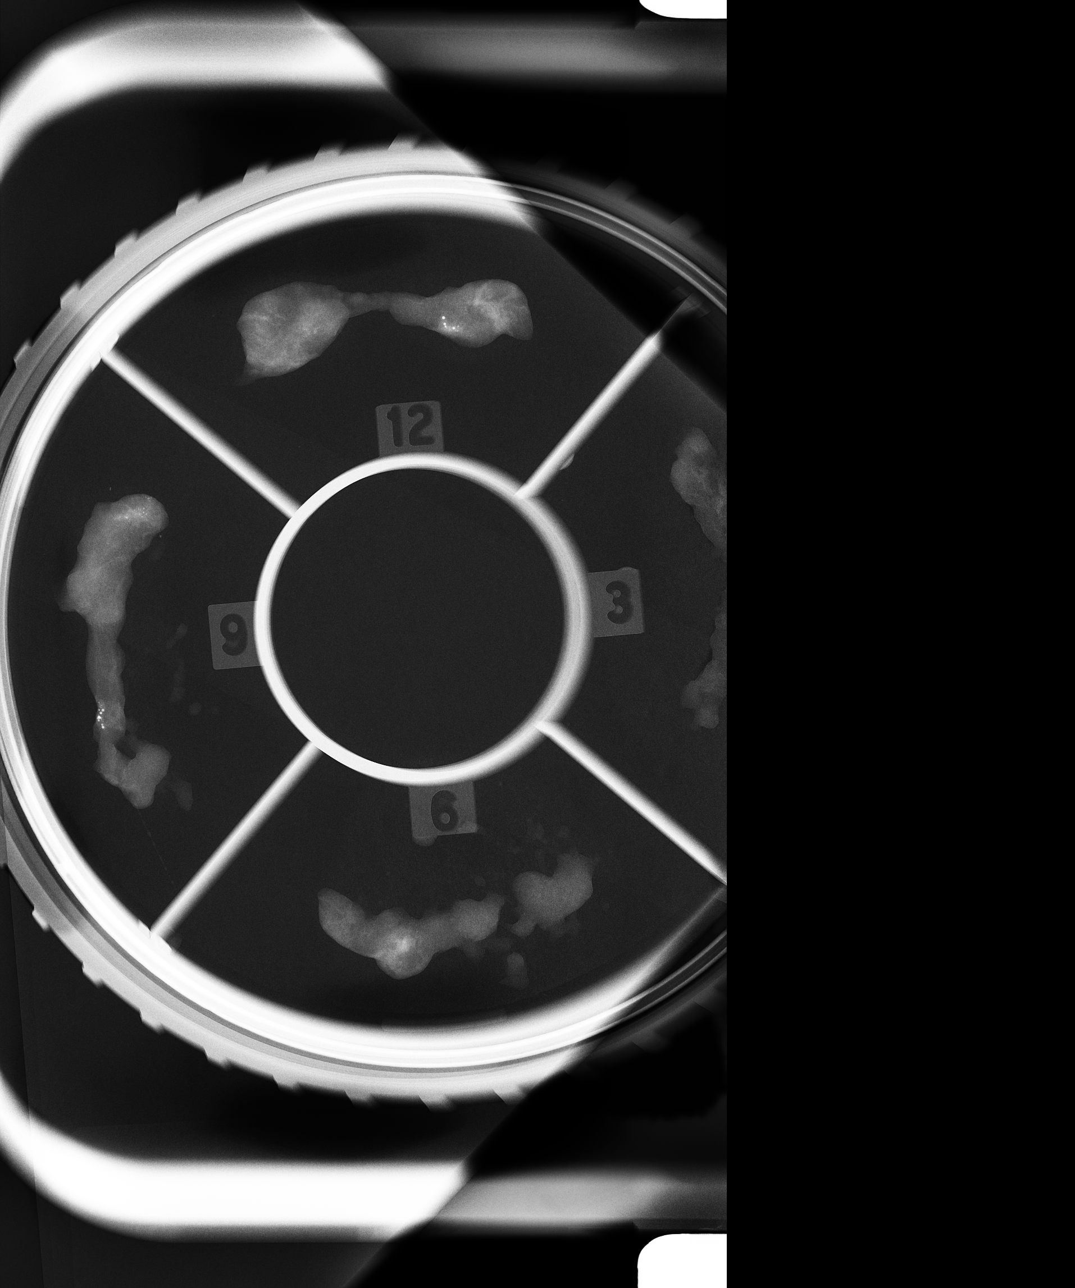

[L CC · left · 2 of 2 slices shown (2 of 2)]
[im 1/2]
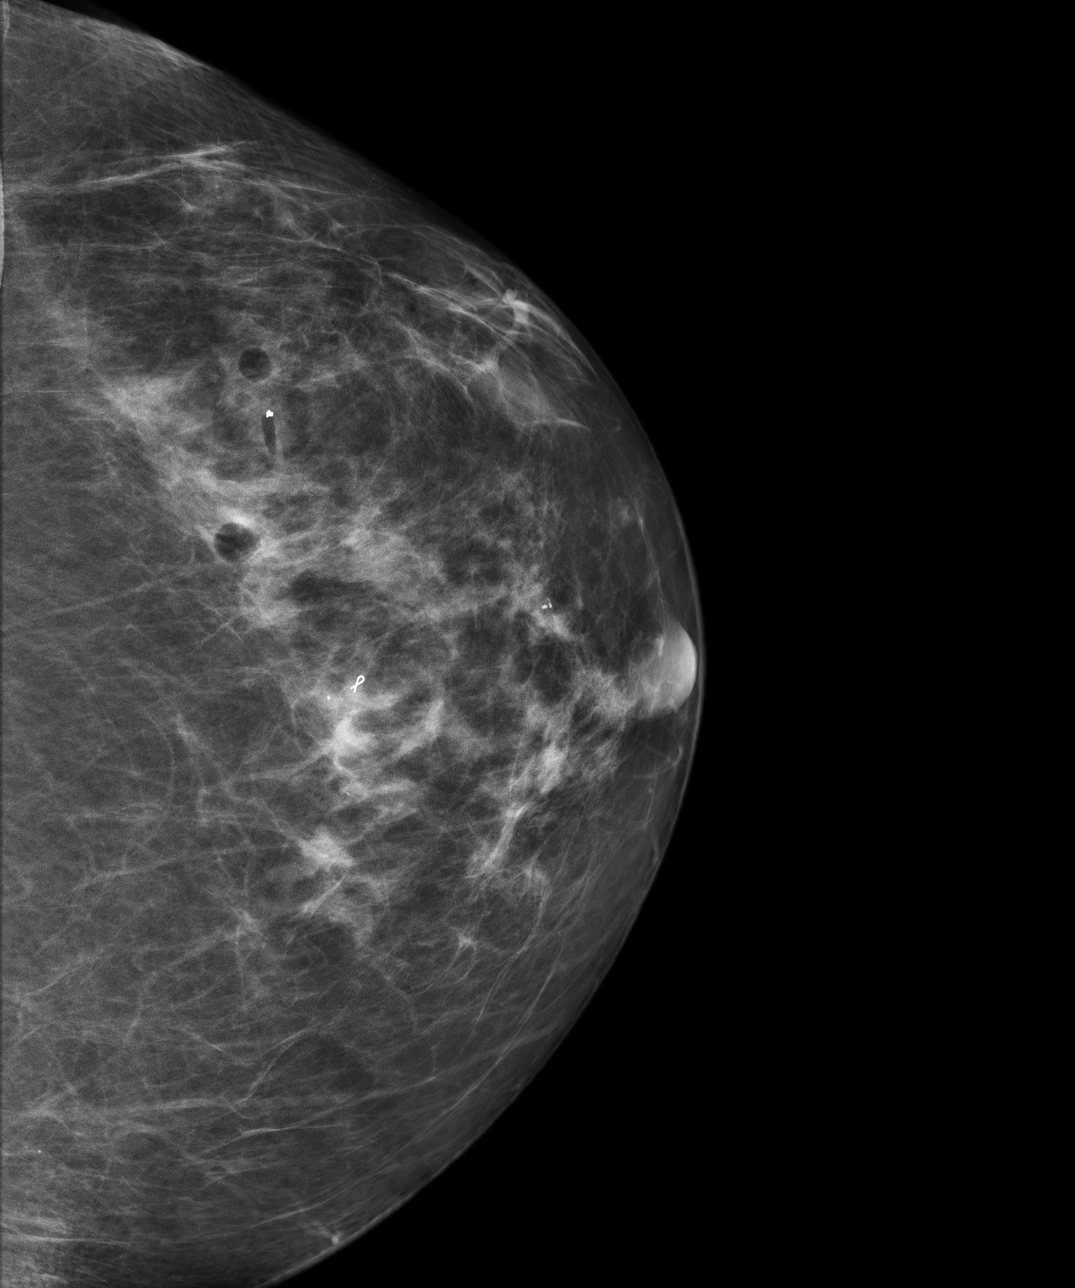
[im 2/2]
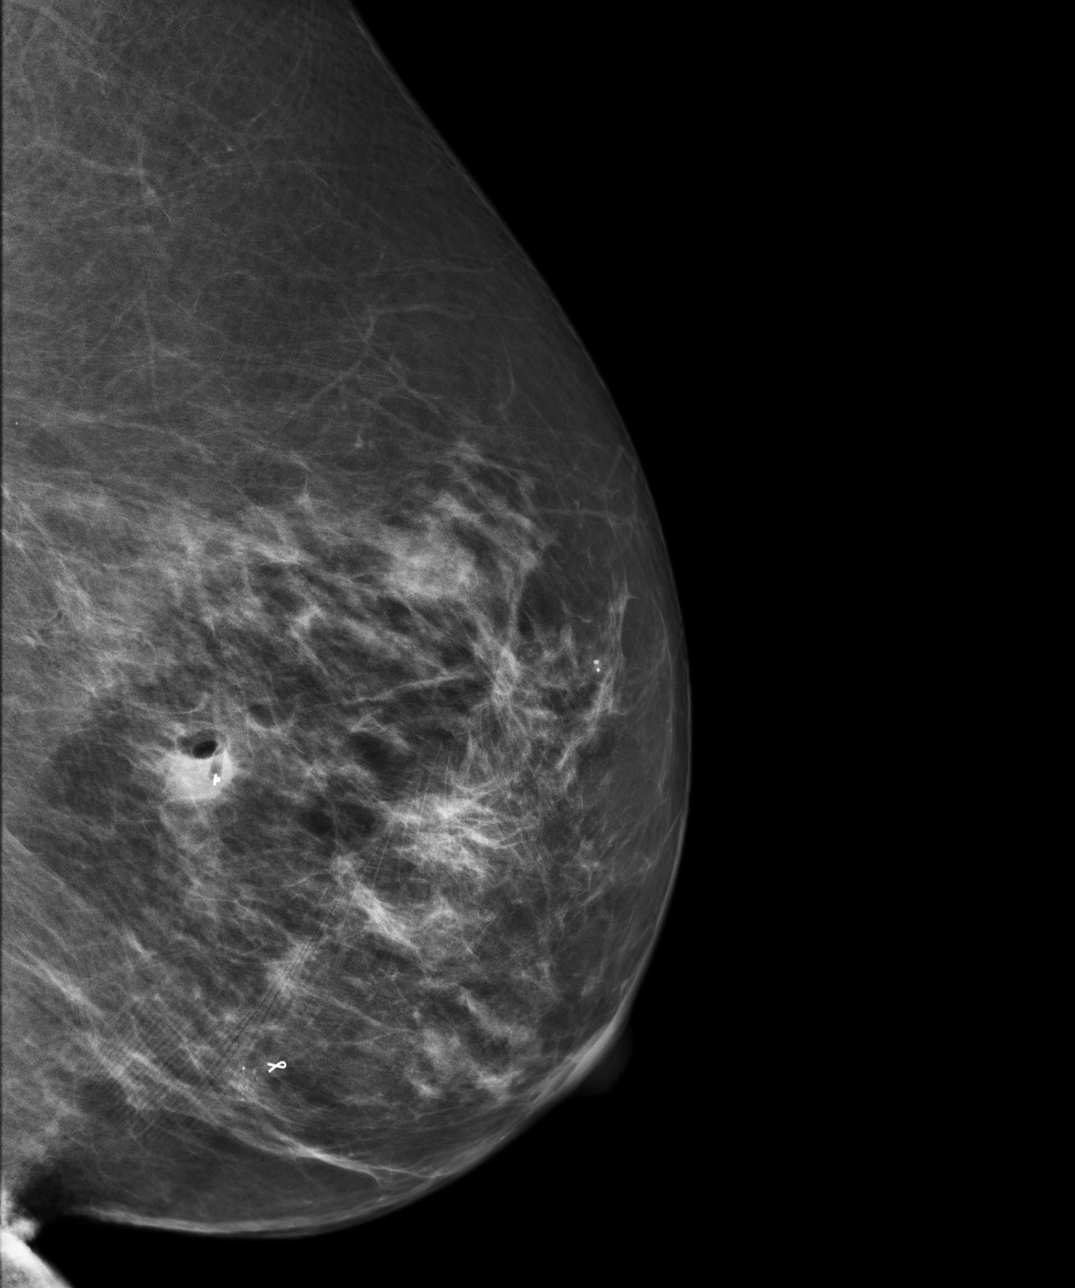

[4 of 4 positions shown; findings below may reference images not displayed]



Using sterile technique and 2% Lidocaine as local anesthetic, under
stereotactic guidance, a 9 gauge vacuum assisted device was used to
perform core needle biopsy of calcifications in the upper-outer
quadrant of the left breast using a lateral approach. Specimen
radiograph was performed showing calcifications to be present.
Specimens with calcifications are identified for pathology.

At the conclusion of the procedure, a T-shaped tissue marker clip
was deployed into the biopsy cavity. Follow-up 2-view mammogram
confirmed clip in expected location.
IMPRESSION: Stereotactic-guided biopsy of left breast calcifications. No
apparent complications.

## 2013-09-04 IMAGING — CR MM BREAST BX W LOC DEV 1ST LESION IMAGE BX SPEC STEREO GUIDE
5 of 6 series · 6 of 8 positions shown · non-contrast
Comparison: Previous exams.

ADDENDUM:
Pathologic results have become available and indicate columnar cell
change with sclerosing adenosis. This is concordant with the imaging
findings. The patient may now proceed with planned lumpectomy for
the malignant cluster of calcifications. The patient and I discussed
this by telephone on [DATE] at [XA] hours. The patient indicated
no complaints or complications related to the biopsy.
CLINICAL DATA: Patient has known malignancy in the lower central
portion of the left breast based on recent ultrasound-guided core
biopsy. Request is made for stereotactic guided core biopsy of group
of calcifications in the upper-outer quadrant of the left breast.

EXAM:
LEFT BREAST STEREOTACTIC CORE NEEDLE BIOPSY

[LM (1 of 5)]
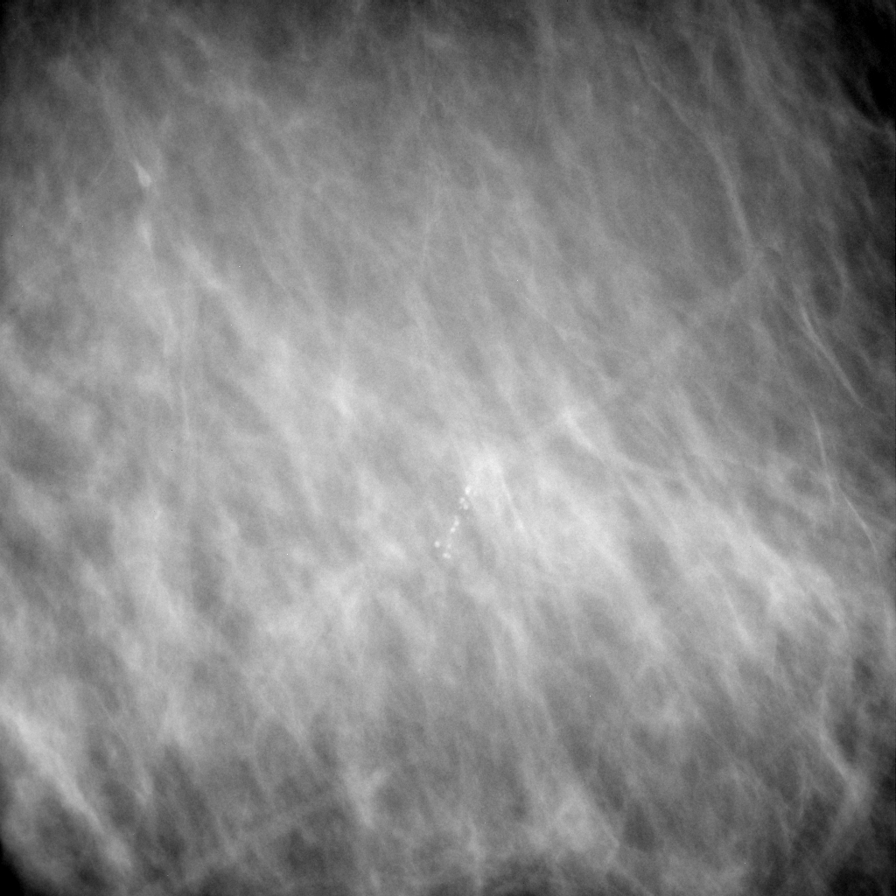

[LM (2 of 5)]
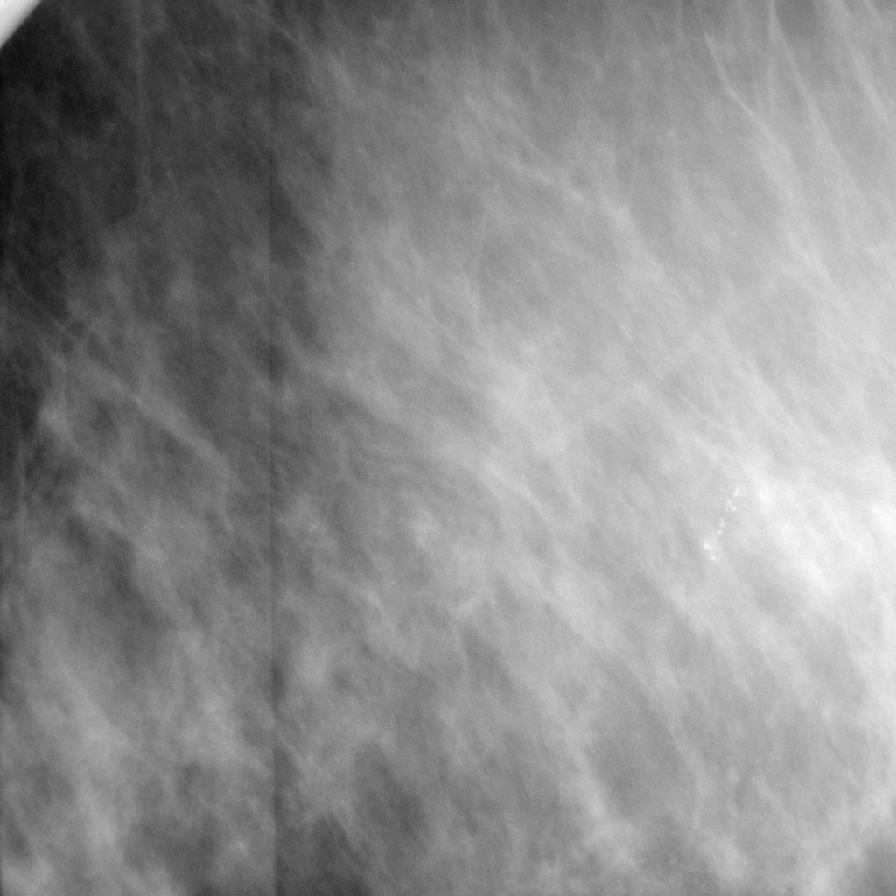

[LM (3 of 5)]
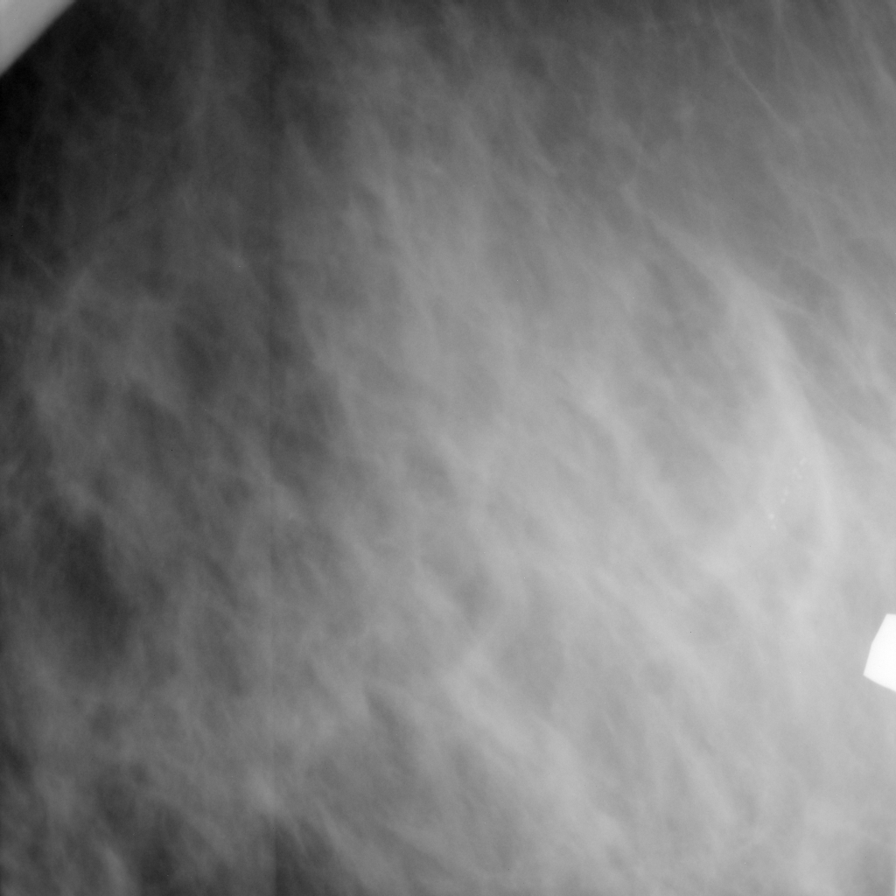

[Series 3: LM · left · 2 of 2 slices shown (4 of 5)]
[im 1/2]
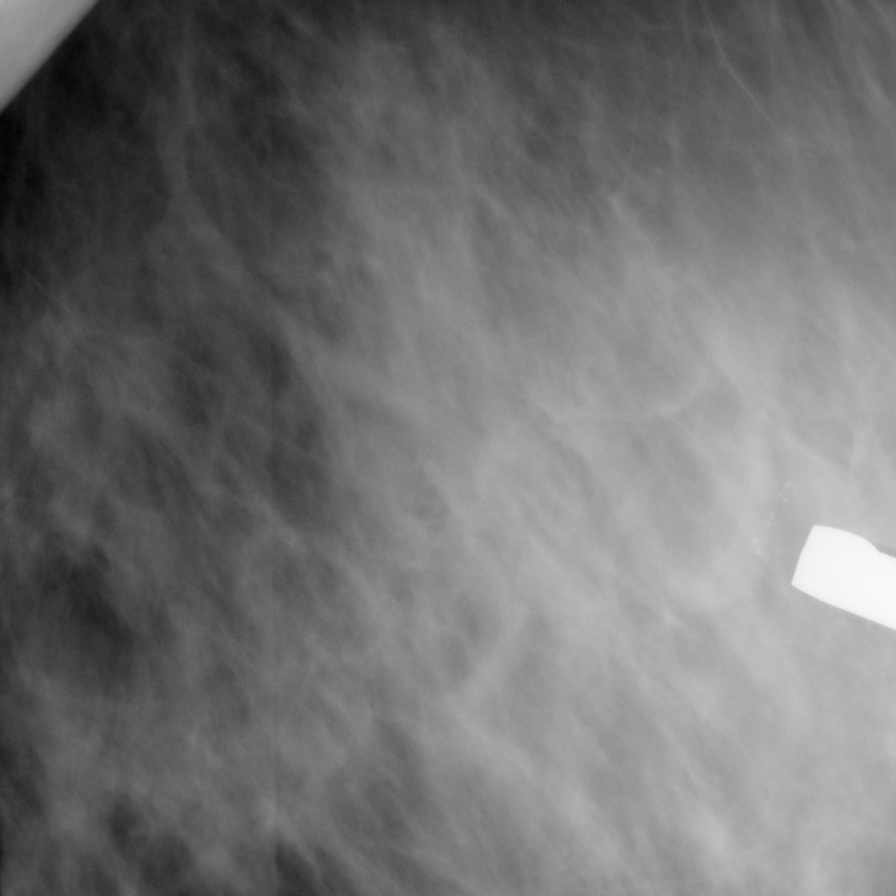
[im 2/2]
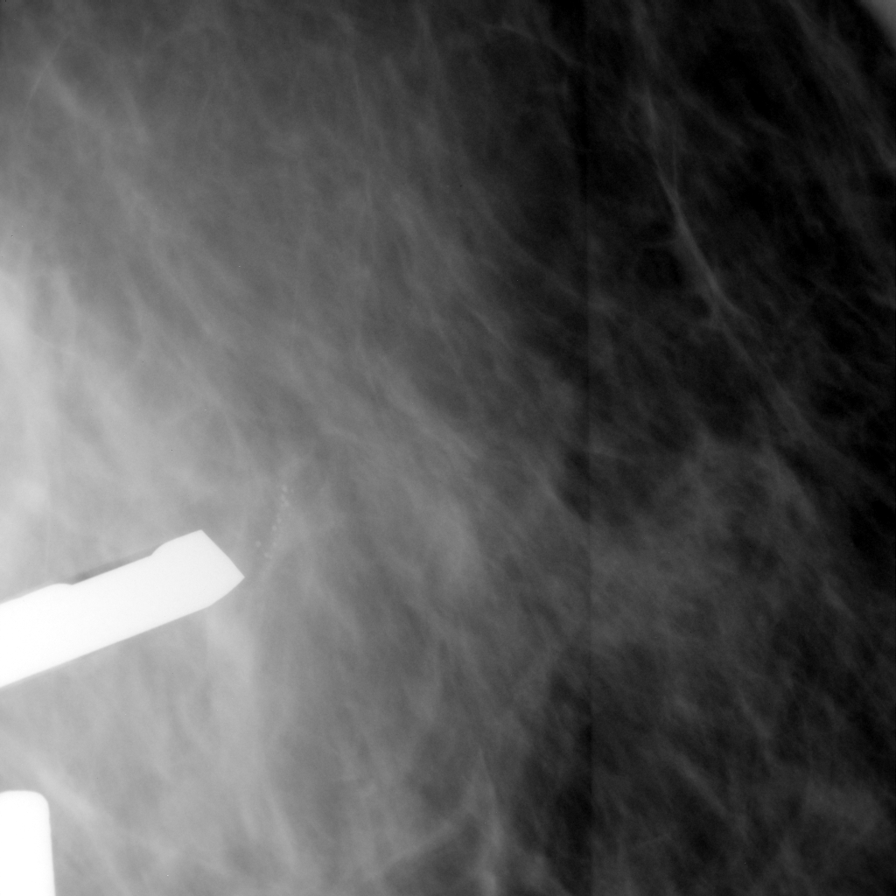

[LM (5 of 5)]
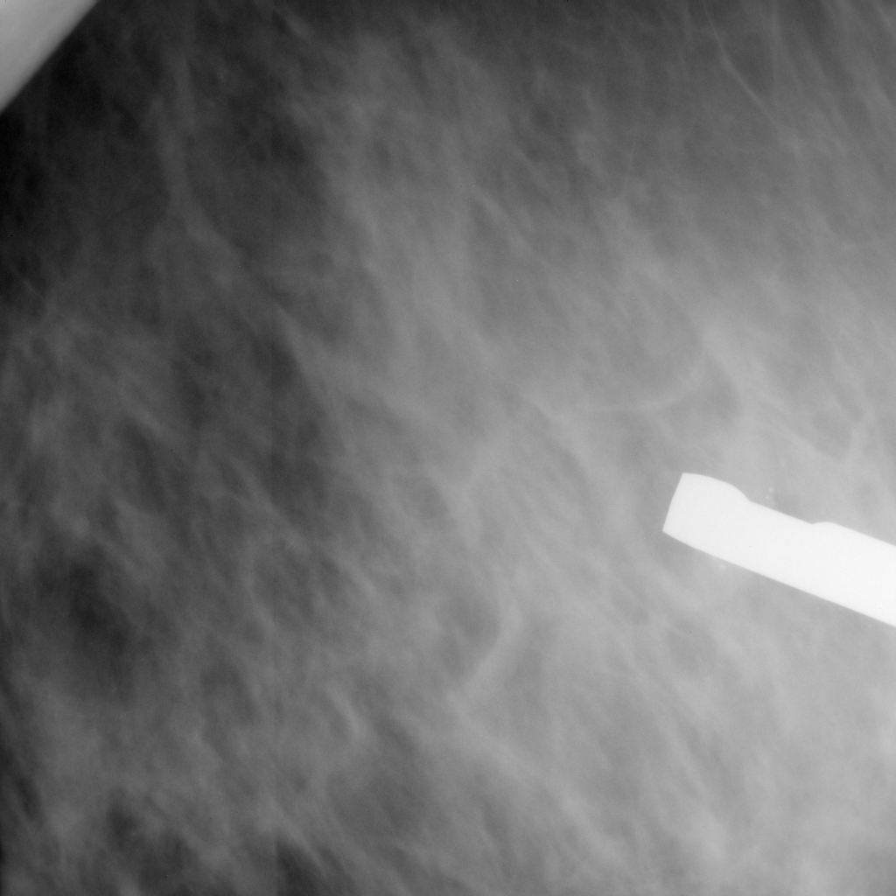

[6 of 8 positions shown; findings below may reference images not displayed]



Using sterile technique and 2% Lidocaine as local anesthetic, under
stereotactic guidance, a 9 gauge vacuum assisted device was used to
perform core needle biopsy of calcifications in the upper-outer
quadrant of the left breast using a lateral approach. Specimen
radiograph was performed showing calcifications to be present.
Specimens with calcifications are identified for pathology.

At the conclusion of the procedure, a T-shaped tissue marker clip
was deployed into the biopsy cavity. Follow-up 2-view mammogram
confirmed clip in expected location.
IMPRESSION: Stereotactic-guided biopsy of left breast calcifications. No
apparent complications.

## 2013-09-05 LAB — PATHOLOGY REPORT

## 2013-09-07 ENCOUNTER — Ambulatory Visit: Payer: Self-pay | Admitting: Oncology

## 2013-09-25 ENCOUNTER — Ambulatory Visit: Payer: Self-pay | Admitting: Surgery

## 2013-09-25 LAB — CBC WITH DIFFERENTIAL/PLATELET
BASOS PCT: 0.7 %
Basophil #: 0.1 10*3/uL (ref 0.0–0.1)
Eosinophil #: 0.3 10*3/uL (ref 0.0–0.7)
Eosinophil %: 3.5 %
HCT: 47.6 % — AB (ref 35.0–47.0)
HGB: 15.7 g/dL (ref 12.0–16.0)
LYMPHS ABS: 1.9 10*3/uL (ref 1.0–3.6)
LYMPHS PCT: 21.1 %
MCH: 28.5 pg (ref 26.0–34.0)
MCHC: 33.1 g/dL (ref 32.0–36.0)
MCV: 86 fL (ref 80–100)
MONO ABS: 0.7 x10 3/mm (ref 0.2–0.9)
Monocyte %: 7.5 %
Neutrophil #: 6.1 10*3/uL (ref 1.4–6.5)
Neutrophil %: 67.2 %
Platelet: 222 10*3/uL (ref 150–440)
RBC: 5.52 10*6/uL — AB (ref 3.80–5.20)
RDW: 13.4 % (ref 11.5–14.5)
WBC: 9.1 10*3/uL (ref 3.6–11.0)

## 2013-10-03 ENCOUNTER — Ambulatory Visit: Payer: Self-pay | Admitting: Surgery

## 2013-10-03 IMAGING — MG MM BREAST NEEDLE LOCALIZATION*L*
1 series · 8 of 8 positions shown · non-contrast
Comparison: Previous exams.

CLINICAL DATA: Preoperative needle localization of the left breast
for invasive mammary carcinoma at site of ribbon shaped biopsy
marking clip.

EXAM:
NEEDLE LOCALIZATION OF THE LEFT BREAST WITH MAMMO GUIDANCE

[L FB · left · 8 of 8 slices shown]
[im 1/8]
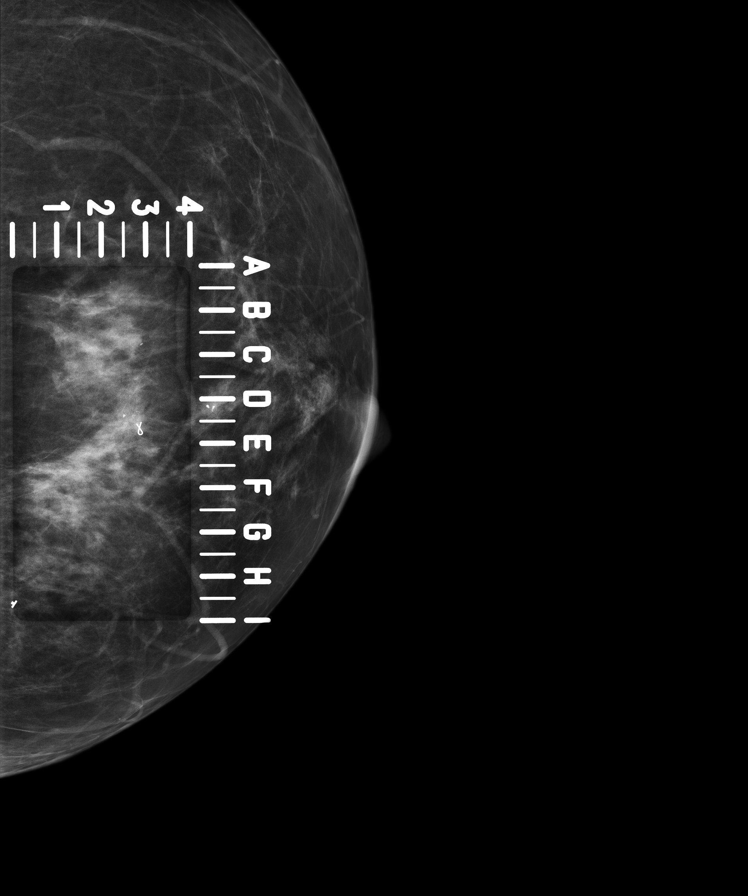
[im 2/8]
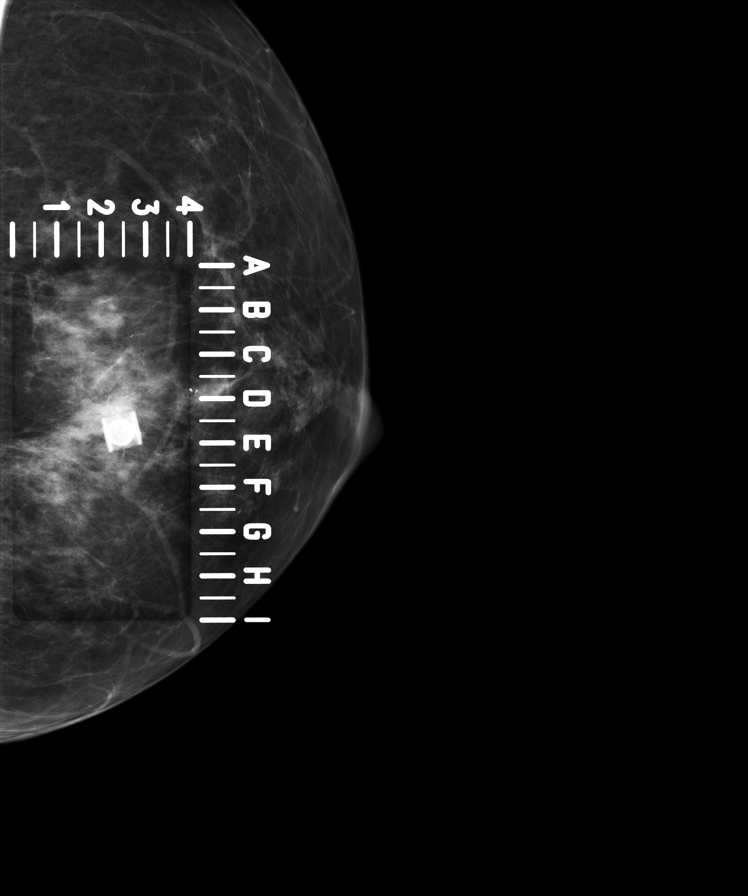
[im 3/8]
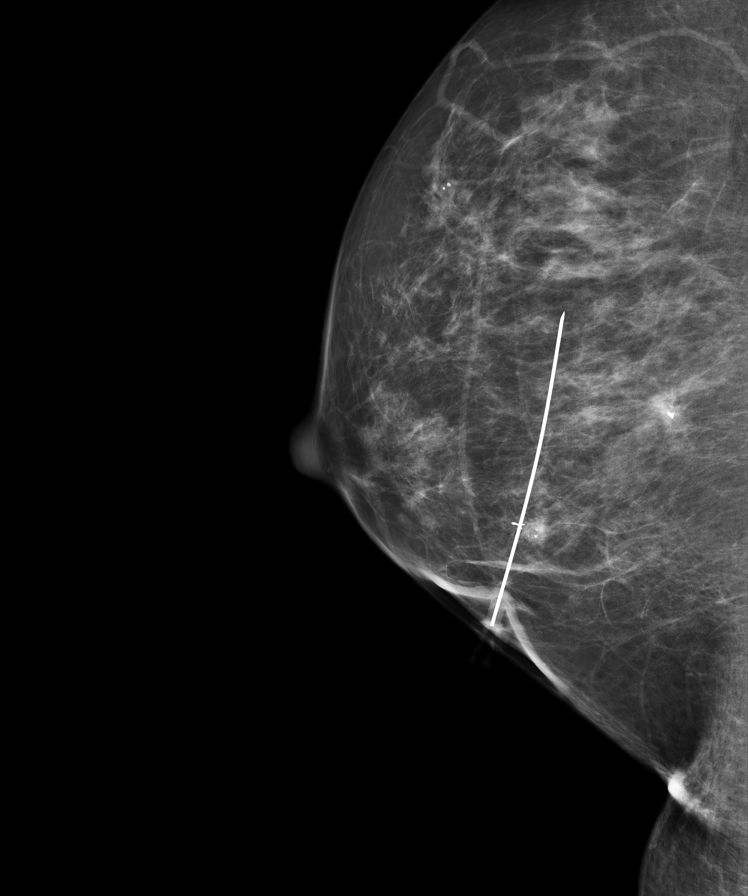
[im 4/8]
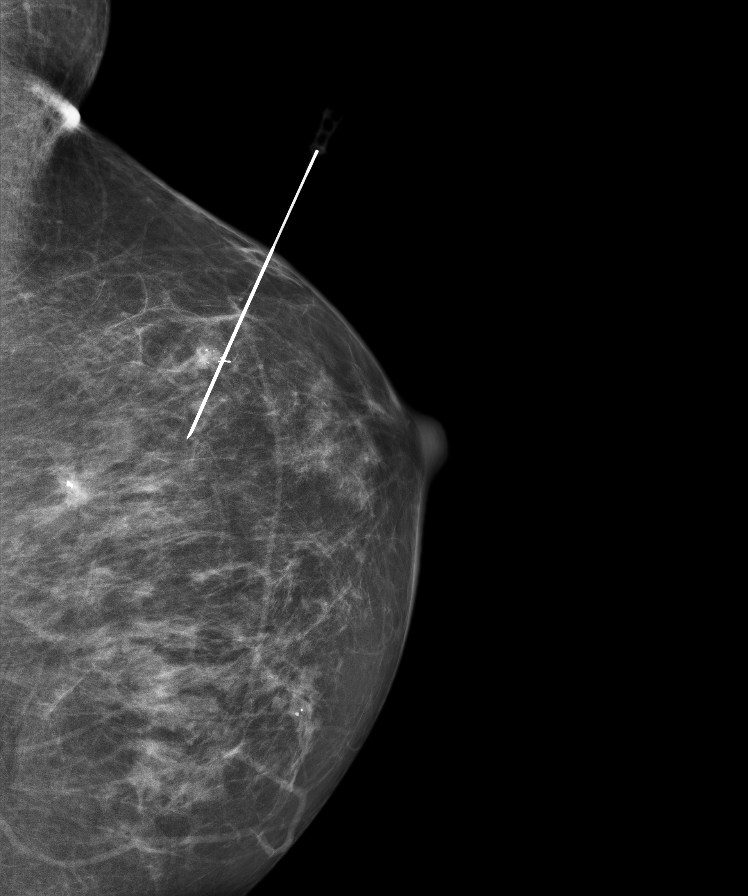
[im 5/8]
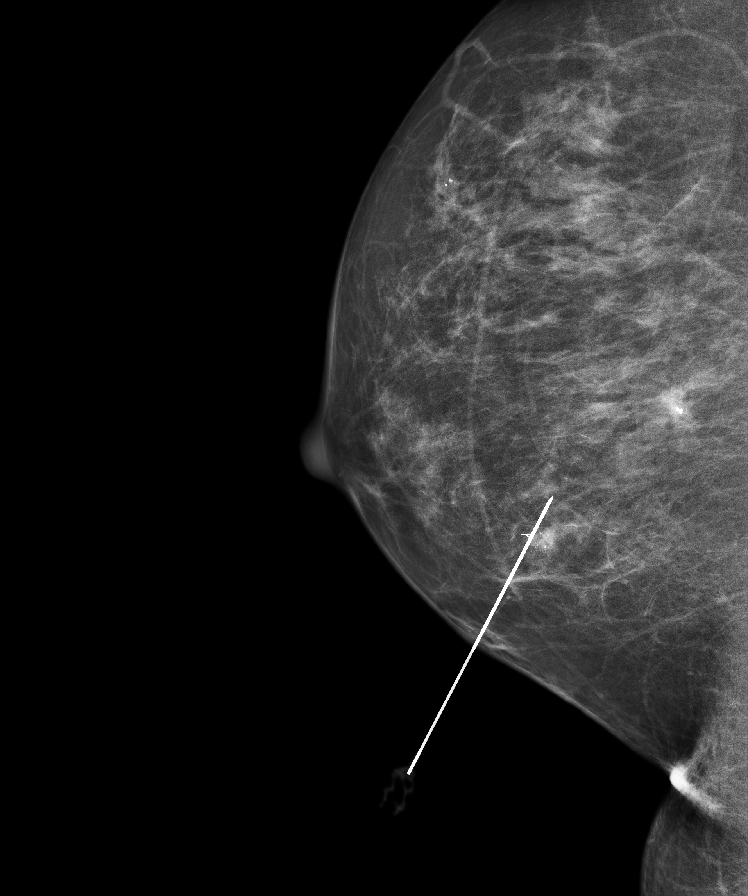
[im 6/8]
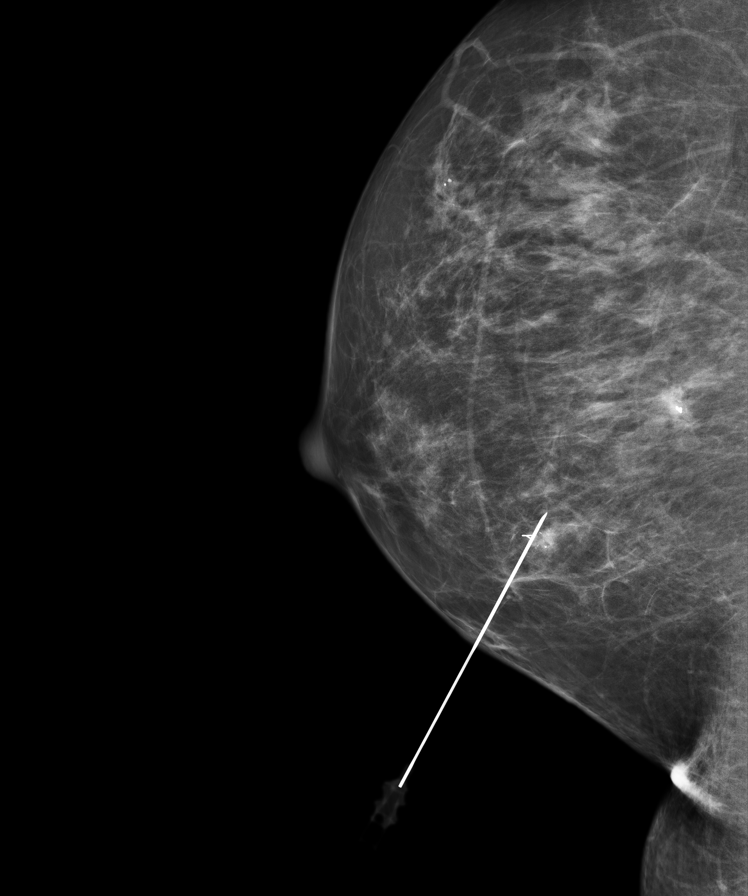
[im 7/8]
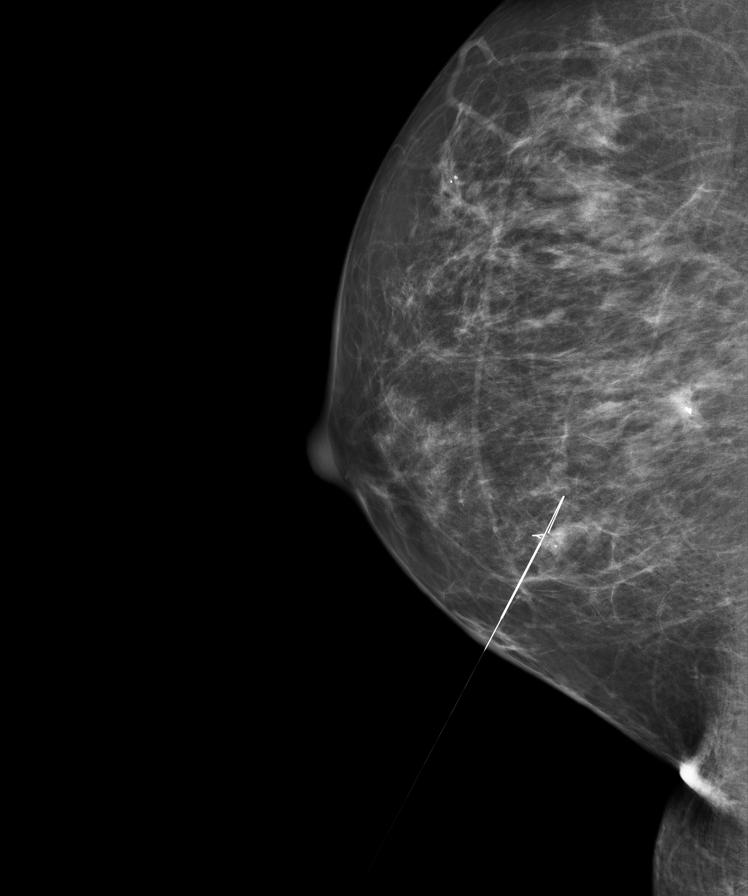
[im 8/8]
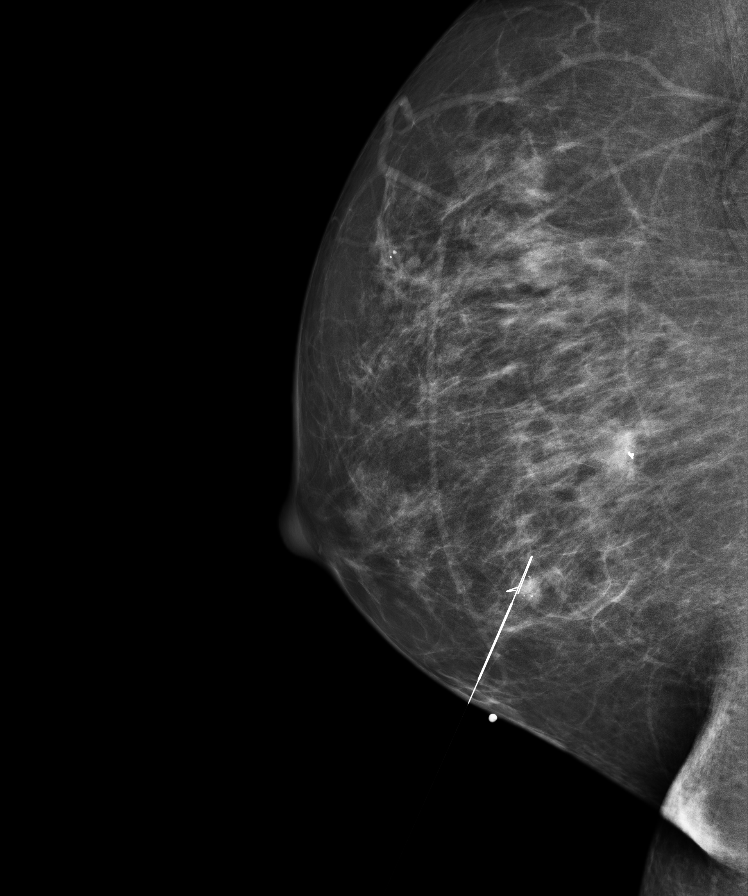

[8 of 8 positions shown; findings below may reference images not displayed]

FINDINGS: Patient presents for needle localization prior to left breast
lumpectomy. I met with the patient and we discussed the procedure of
needle localization including benefits and alternatives. We
discussed the high likelihood of a successful procedure. We
discussed the risks of the procedure, including infection, bleeding,
tissue injury, and further surgery. Informed, written consent was
given. The usual time-out protocol was performed immediately prior
to the procedure.

Using mammographic guidance, sterile technique, 2% lidocaine and a 7
cm modified Kopans needle, the ribbon shaped biopsy marking clip
with the associated calcifications and a mass was localized using an
inferior approach. The films were marked for Dr. SAPARILLA.
IMPRESSION: Needle localization left breast. No apparent complications.

## 2013-10-03 IMAGING — MG MM BREAST SURGICAL SPECIMEN
2 series · 2 of 2 positions shown · non-contrast
Comparison: Previous exam(s)

CLINICAL DATA: Postoperative specimen radiograph postlumpectomy for
invasive carcinoma of the left breast.

EXAM:
SPECIMEN RADIOGRAPH OF THE LEFT BREAST

[L CC (1 of 2)]
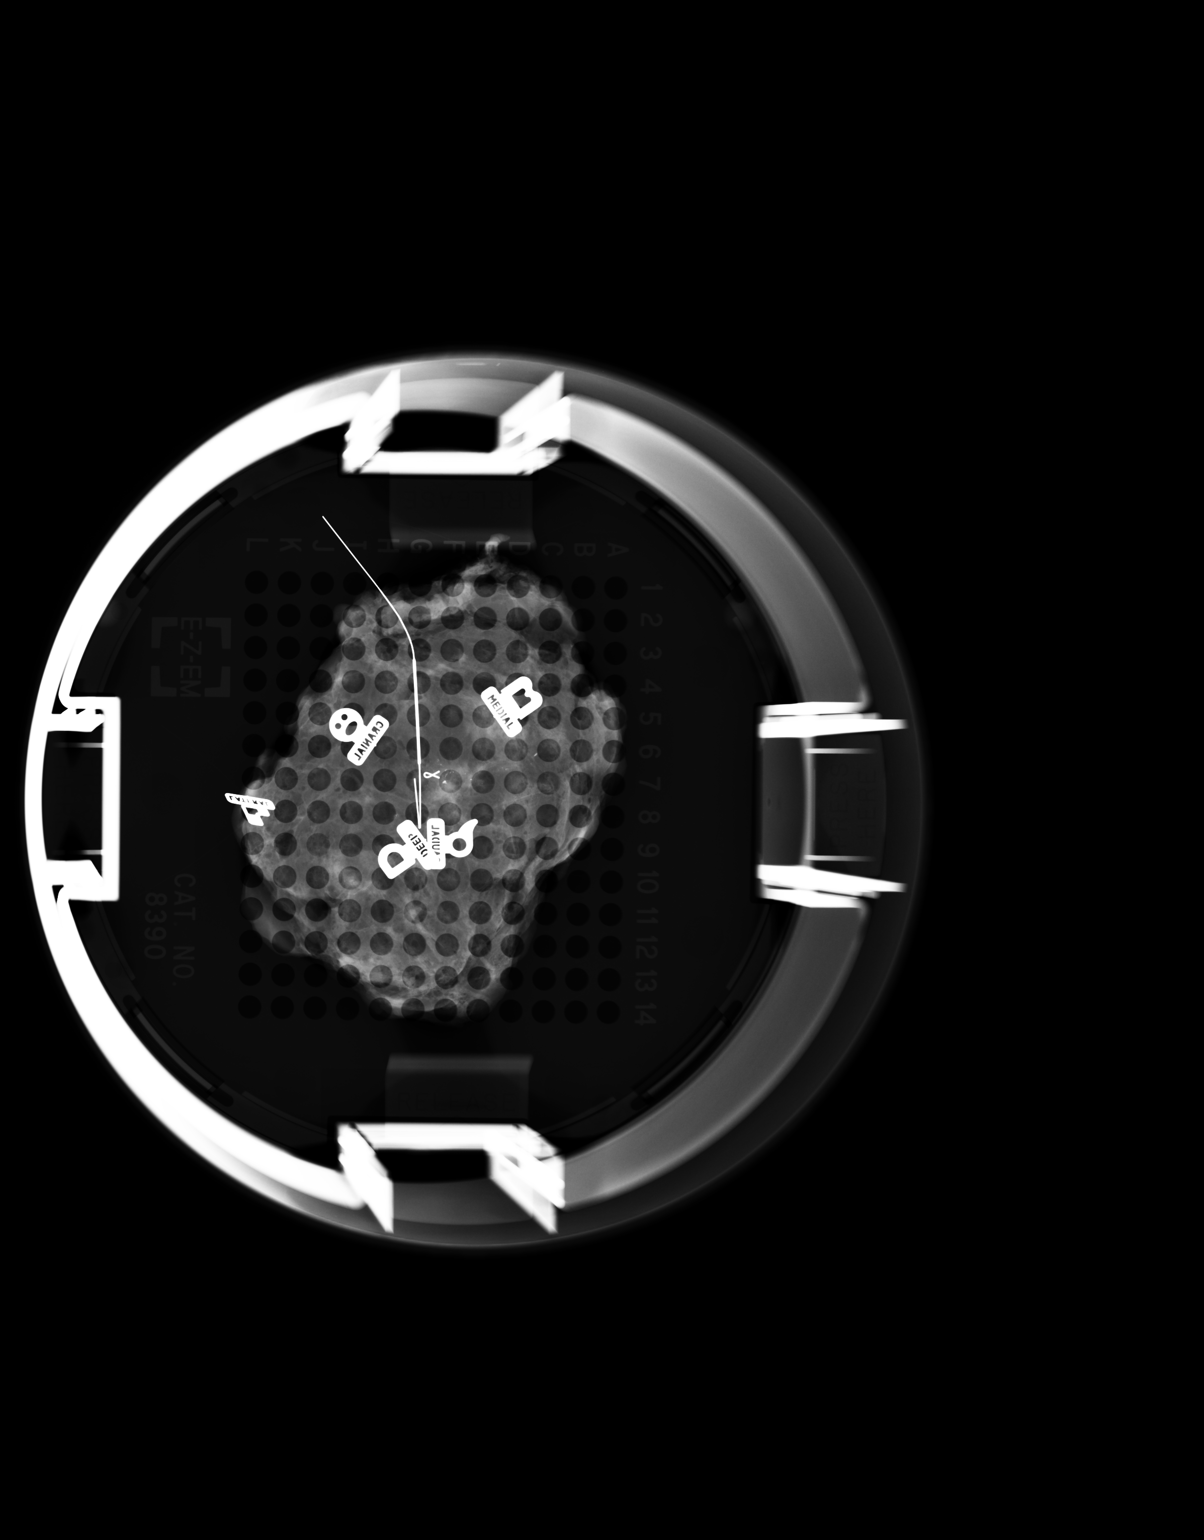

[L CC (2 of 2)]
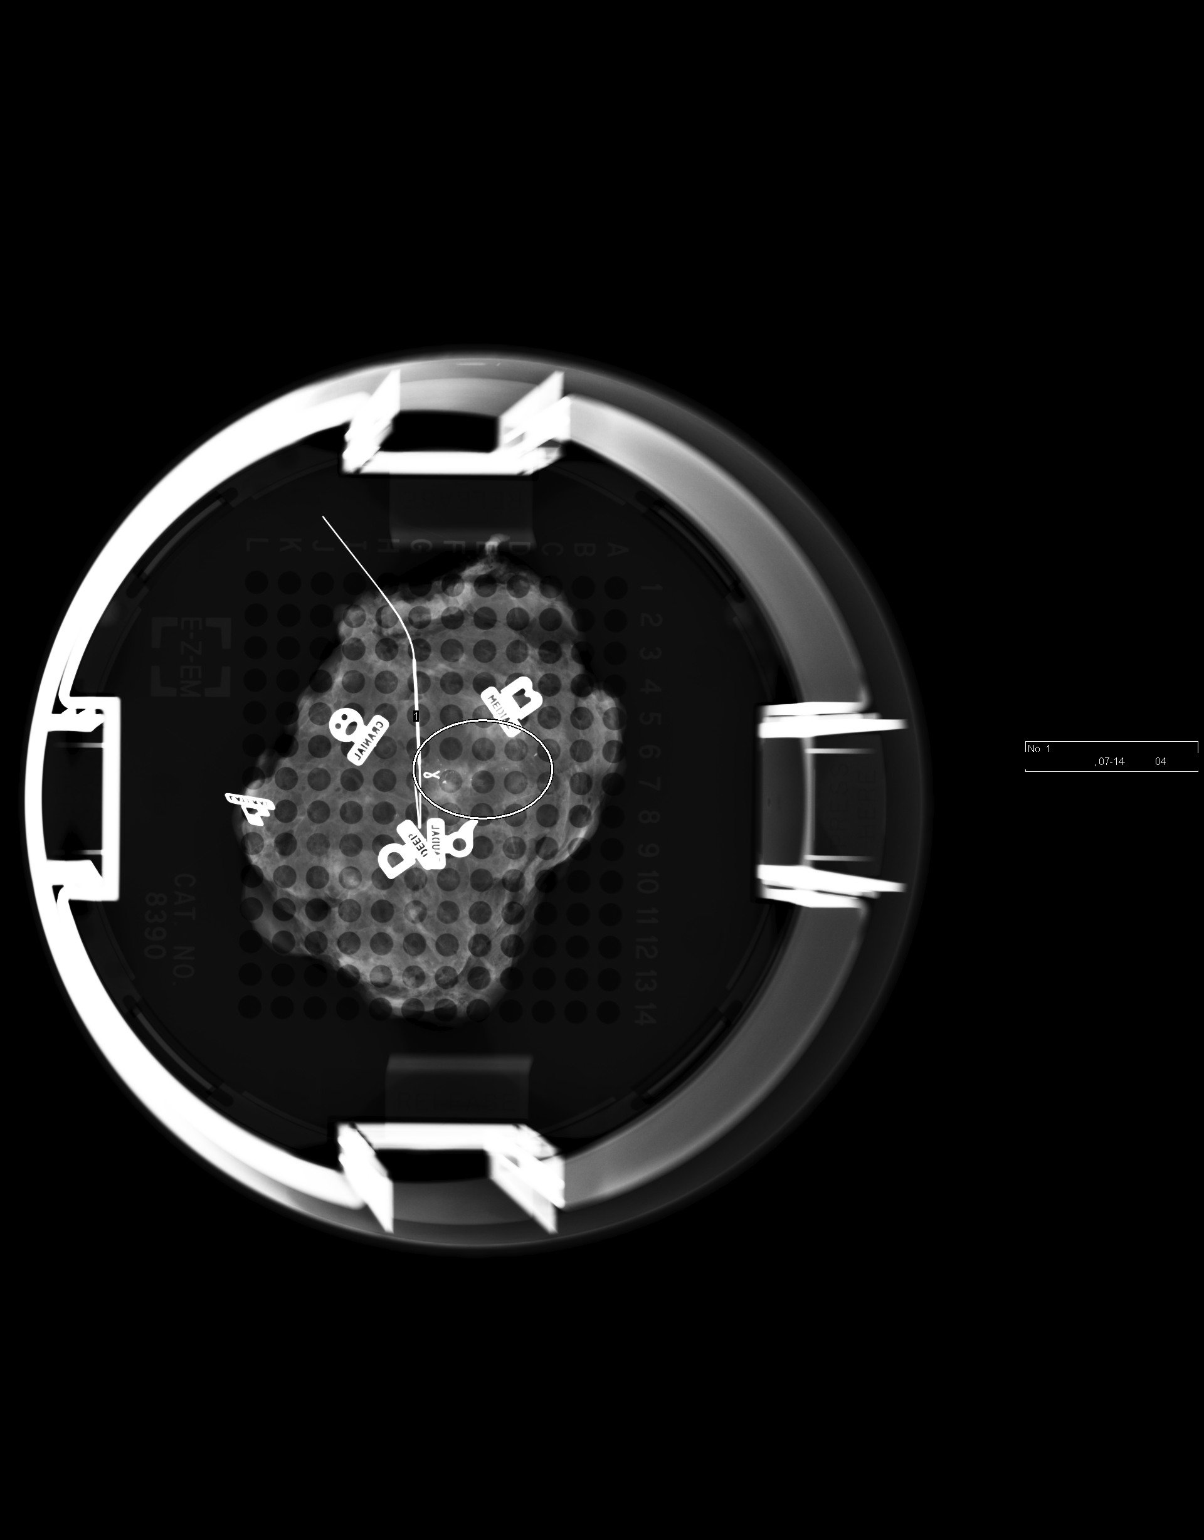

[2 of 2 positions shown; findings below may reference images not displayed]

FINDINGS: Status post excision of the left breast. The wire tip and ribbon
shaped biopsy marker clip as well as the associated pleomorphic
calcifications are present and are marked for pathology.
IMPRESSION: Specimen radiograph of the left breast.

## 2013-10-07 ENCOUNTER — Ambulatory Visit: Payer: Self-pay | Admitting: Oncology

## 2013-10-13 ENCOUNTER — Ambulatory Visit: Payer: Self-pay | Admitting: Oncology

## 2013-10-26 ENCOUNTER — Ambulatory Visit: Payer: Self-pay | Admitting: Radiation Oncology

## 2013-11-09 LAB — CBC CANCER CENTER
Basophil #: 0.1 x10 3/mm (ref 0.0–0.1)
Basophil %: 1 %
Eosinophil #: 0.4 x10 3/mm (ref 0.0–0.7)
Eosinophil %: 4.4 %
HCT: 47.5 % — AB (ref 35.0–47.0)
HGB: 15.7 g/dL (ref 12.0–16.0)
LYMPHS PCT: 22.4 %
Lymphocyte #: 2 x10 3/mm (ref 1.0–3.6)
MCH: 28.6 pg (ref 26.0–34.0)
MCHC: 33.2 g/dL (ref 32.0–36.0)
MCV: 86 fL (ref 80–100)
Monocyte #: 0.6 x10 3/mm (ref 0.2–0.9)
Monocyte %: 6.4 %
Neutrophil #: 5.8 x10 3/mm (ref 1.4–6.5)
Neutrophil %: 65.8 %
Platelet: 212 x10 3/mm (ref 150–440)
RBC: 5.5 10*6/uL — AB (ref 3.80–5.20)
RDW: 13.6 % (ref 11.5–14.5)
WBC: 8.8 x10 3/mm (ref 3.6–11.0)

## 2013-11-09 LAB — COMPREHENSIVE METABOLIC PANEL
ALBUMIN: 3.6 g/dL (ref 3.4–5.0)
ANION GAP: 10 (ref 7–16)
Alkaline Phosphatase: 97 U/L
BUN: 10 mg/dL (ref 7–18)
Bilirubin,Total: 0.7 mg/dL (ref 0.2–1.0)
CALCIUM: 8.6 mg/dL (ref 8.5–10.1)
Chloride: 101 mmol/L (ref 98–107)
Co2: 28 mmol/L (ref 21–32)
Creatinine: 1.09 mg/dL (ref 0.60–1.30)
EGFR (African American): >60
EGFR (Non-African Amer.): 53 — ABNORMAL LOW
GLUCOSE: 145 mg/dL — AB (ref 65–99)
OSMOLALITY: 279 (ref 275–301)
POTASSIUM: 3.2 mmol/L — AB (ref 3.5–5.1)
SGOT(AST): 26 U/L (ref 15–37)
SGPT (ALT): 41 U/L
Sodium: 139 mmol/L (ref 136–145)
TOTAL PROTEIN: 7.5 g/dL (ref 6.4–8.2)

## 2013-11-16 LAB — COMPREHENSIVE METABOLIC PANEL
ANION GAP: 15 (ref 7–16)
AST: 34 U/L (ref 15–37)
Albumin: 3.1 g/dL — ABNORMAL LOW (ref 3.4–5.0)
Alkaline Phosphatase: 80 U/L
BILIRUBIN TOTAL: 1 mg/dL (ref 0.2–1.0)
BUN: 14 mg/dL (ref 7–18)
CHLORIDE: 92 mmol/L — AB (ref 98–107)
CO2: 25 mmol/L (ref 21–32)
CREATININE: 1.19 mg/dL (ref 0.60–1.30)
Calcium, Total: 9 mg/dL (ref 8.5–10.1)
EGFR (African American): 55 — ABNORMAL LOW
GFR CALC NON AF AMER: 48 — AB
Glucose: 168 mg/dL — ABNORMAL HIGH (ref 65–99)
Osmolality: 269 (ref 275–301)
POTASSIUM: 3.3 mmol/L — AB (ref 3.5–5.1)
SGPT (ALT): 42 U/L
Sodium: 132 mmol/L — ABNORMAL LOW (ref 136–145)
TOTAL PROTEIN: 7.6 g/dL (ref 6.4–8.2)

## 2013-11-16 LAB — CBC CANCER CENTER
BASOS ABS: 0 x10 3/mm (ref 0.0–0.1)
BASOS PCT: 2.4 %
EOS ABS: 0 x10 3/mm (ref 0.0–0.7)
Eosinophil %: 1.4 %
HCT: 46.6 % (ref 35.0–47.0)
HGB: 15.7 g/dL (ref 12.0–16.0)
Lymphocyte #: 0.4 x10 3/mm — ABNORMAL LOW (ref 1.0–3.6)
Lymphocyte %: 73.1 %
MCH: 28.5 pg (ref 26.0–34.0)
MCHC: 33.7 g/dL (ref 32.0–36.0)
MCV: 84 fL (ref 80–100)
MONO ABS: 0.1 x10 3/mm — AB (ref 0.2–0.9)
Monocyte %: 19 %
NEUTROS ABS: 0 x10 3/mm — AB (ref 1.4–6.5)
Neutrophil %: 4.1 %
Platelet: 183 x10 3/mm (ref 150–440)
RBC: 5.52 10*6/uL — AB (ref 3.80–5.20)
RDW: 12.9 % (ref 11.5–14.5)
WBC: 0.6 x10 3/mm — AB (ref 3.6–11.0)

## 2013-11-21 ENCOUNTER — Ambulatory Visit: Payer: Self-pay | Admitting: Radiation Oncology

## 2013-11-24 LAB — CBC CANCER CENTER
Basophil #: 0.1 x10 3/mm (ref 0.0–0.1)
Basophil %: 0.8 %
EOS PCT: 0 %
Eosinophil #: 0 x10 3/mm (ref 0.0–0.7)
HCT: 42.6 % (ref 35.0–47.0)
HGB: 14.3 g/dL (ref 12.0–16.0)
Lymphocyte #: 0.8 x10 3/mm — ABNORMAL LOW (ref 1.0–3.6)
Lymphocyte %: 7.6 %
MCH: 28.4 pg (ref 26.0–34.0)
MCHC: 33.5 g/dL (ref 32.0–36.0)
MCV: 85 fL (ref 80–100)
MONO ABS: 1.4 x10 3/mm — AB (ref 0.2–0.9)
Monocyte %: 13.1 %
Neutrophil #: 8.5 x10 3/mm — ABNORMAL HIGH (ref 1.4–6.5)
Neutrophil %: 78.5 %
PLATELETS: 234 x10 3/mm (ref 150–440)
RBC: 5.03 10*6/uL (ref 3.80–5.20)
RDW: 13.1 % (ref 11.5–14.5)
WBC: 10.8 x10 3/mm (ref 3.6–11.0)

## 2013-12-21 ENCOUNTER — Ambulatory Visit: Payer: Self-pay | Admitting: Radiation Oncology

## 2014-01-04 LAB — PATHOLOGY REPORT

## 2014-01-09 LAB — CBC CANCER CENTER
BASOS ABS: 0.1 x10 3/mm (ref 0.0–0.1)
BASOS PCT: 1.2 %
EOS ABS: 0.3 x10 3/mm (ref 0.0–0.7)
Eosinophil %: 4.4 %
HCT: 45.3 % (ref 35.0–47.0)
HGB: 14.7 g/dL (ref 12.0–16.0)
LYMPHS ABS: 0.9 x10 3/mm — AB (ref 1.0–3.6)
Lymphocyte %: 12.9 %
MCH: 27.6 pg (ref 26.0–34.0)
MCHC: 32.4 g/dL (ref 32.0–36.0)
MCV: 85 fL (ref 80–100)
MONO ABS: 0.3 x10 3/mm (ref 0.2–0.9)
Monocyte %: 4.3 %
NEUTROS ABS: 5.2 x10 3/mm (ref 1.4–6.5)
NEUTROS PCT: 77.2 %
Platelet: 201 x10 3/mm (ref 150–440)
RBC: 5.32 10*6/uL — ABNORMAL HIGH (ref 3.80–5.20)
RDW: 14.7 % — ABNORMAL HIGH (ref 11.5–14.5)
WBC: 6.7 x10 3/mm (ref 3.6–11.0)

## 2014-01-16 LAB — CBC CANCER CENTER
Basophil #: 0.1 x10 3/mm (ref 0.0–0.1)
Basophil %: 1.2 %
Eosinophil #: 0.2 x10 3/mm (ref 0.0–0.7)
Eosinophil %: 3.8 %
HCT: 43.7 % (ref 35.0–47.0)
HGB: 14.4 g/dL (ref 12.0–16.0)
LYMPHS ABS: 0.7 x10 3/mm — AB (ref 1.0–3.6)
Lymphocyte %: 13 %
MCH: 27.6 pg (ref 26.0–34.0)
MCHC: 32.9 g/dL (ref 32.0–36.0)
MCV: 84 fL (ref 80–100)
Monocyte #: 0.3 x10 3/mm (ref 0.2–0.9)
Monocyte %: 4.6 %
NEUTROS PCT: 77.4 %
Neutrophil #: 4.3 x10 3/mm (ref 1.4–6.5)
Platelet: 196 x10 3/mm (ref 150–440)
RBC: 5.2 10*6/uL (ref 3.80–5.20)
RDW: 14.3 % (ref 11.5–14.5)
WBC: 5.5 x10 3/mm (ref 3.6–11.0)

## 2014-01-21 ENCOUNTER — Ambulatory Visit: Payer: Self-pay | Admitting: Radiation Oncology

## 2014-01-23 LAB — CBC CANCER CENTER
Basophil #: 0.1 x10 3/mm (ref 0.0–0.1)
Basophil %: 1.1 %
EOS ABS: 0.2 x10 3/mm (ref 0.0–0.7)
Eosinophil %: 3.3 %
HCT: 44.7 % (ref 35.0–47.0)
HGB: 14.7 g/dL (ref 12.0–16.0)
Lymphocyte #: 1 x10 3/mm (ref 1.0–3.6)
Lymphocyte %: 13.9 %
MCH: 28 pg (ref 26.0–34.0)
MCHC: 33 g/dL (ref 32.0–36.0)
MCV: 85 fL (ref 80–100)
Monocyte #: 0.4 x10 3/mm (ref 0.2–0.9)
Monocyte %: 5.2 %
Neutrophil #: 5.4 x10 3/mm (ref 1.4–6.5)
Neutrophil %: 76.5 %
PLATELETS: 197 x10 3/mm (ref 150–440)
RBC: 5.27 10*6/uL — ABNORMAL HIGH (ref 3.80–5.20)
RDW: 14.8 % — ABNORMAL HIGH (ref 11.5–14.5)
WBC: 7.1 x10 3/mm (ref 3.6–11.0)

## 2014-01-30 LAB — CBC CANCER CENTER
BASOS ABS: 0.1 x10 3/mm (ref 0.0–0.1)
Basophil %: 1 %
Eosinophil #: 0.2 x10 3/mm (ref 0.0–0.7)
Eosinophil %: 3.9 %
HCT: 41.3 % (ref 35.0–47.0)
HGB: 13.9 g/dL (ref 12.0–16.0)
LYMPHS ABS: 0.7 x10 3/mm — AB (ref 1.0–3.6)
LYMPHS PCT: 13.3 %
MCH: 28.3 pg (ref 26.0–34.0)
MCHC: 33.6 g/dL (ref 32.0–36.0)
MCV: 84 fL (ref 80–100)
MONO ABS: 0.3 x10 3/mm (ref 0.2–0.9)
Monocyte %: 5.5 %
Neutrophil #: 4.3 x10 3/mm (ref 1.4–6.5)
Neutrophil %: 76.3 %
PLATELETS: 169 x10 3/mm (ref 150–440)
RBC: 4.9 10*6/uL (ref 3.80–5.20)
RDW: 14.7 % — ABNORMAL HIGH (ref 11.5–14.5)
WBC: 5.6 x10 3/mm (ref 3.6–11.0)

## 2014-02-06 LAB — CBC CANCER CENTER
BASOS ABS: 0.1 x10 3/mm (ref 0.0–0.1)
Basophil %: 0.9 %
Eosinophil #: 0.1 x10 3/mm (ref 0.0–0.7)
Eosinophil %: 2.1 %
HCT: 44.5 % (ref 35.0–47.0)
HGB: 14.7 g/dL (ref 12.0–16.0)
LYMPHS ABS: 0.9 x10 3/mm — AB (ref 1.0–3.6)
Lymphocyte %: 13.2 %
MCH: 28 pg (ref 26.0–34.0)
MCHC: 33 g/dL (ref 32.0–36.0)
MCV: 85 fL (ref 80–100)
MONO ABS: 0.4 x10 3/mm (ref 0.2–0.9)
Monocyte %: 6.4 %
Neutrophil #: 5.4 x10 3/mm (ref 1.4–6.5)
Neutrophil %: 77.4 %
PLATELETS: 182 x10 3/mm (ref 150–440)
RBC: 5.25 10*6/uL — ABNORMAL HIGH (ref 3.80–5.20)
RDW: 15.1 % — ABNORMAL HIGH (ref 11.5–14.5)
WBC: 7 x10 3/mm (ref 3.6–11.0)

## 2014-02-12 DIAGNOSIS — F325 Major depressive disorder, single episode, in full remission: Secondary | ICD-10-CM | POA: Insufficient documentation

## 2014-02-20 ENCOUNTER — Ambulatory Visit: Payer: Self-pay | Admitting: Radiation Oncology

## 2014-02-20 ENCOUNTER — Ambulatory Visit: Payer: Self-pay | Admitting: Oncology

## 2014-02-20 LAB — CBC CANCER CENTER
BASOS PCT: 0.9 %
Basophil #: 0.1 x10 3/mm (ref 0.0–0.1)
EOS ABS: 0.1 x10 3/mm (ref 0.0–0.7)
Eosinophil %: 1.8 %
HCT: 44.8 % (ref 35.0–47.0)
HGB: 15 g/dL (ref 12.0–16.0)
Lymphocyte #: 1.1 x10 3/mm (ref 1.0–3.6)
Lymphocyte %: 13.7 %
MCH: 28.2 pg (ref 26.0–34.0)
MCHC: 33.4 g/dL (ref 32.0–36.0)
MCV: 84 fL (ref 80–100)
MONO ABS: 0.5 x10 3/mm (ref 0.2–0.9)
Monocyte %: 6.8 %
Neutrophil #: 5.9 x10 3/mm (ref 1.4–6.5)
Neutrophil %: 76.8 %
PLATELETS: 208 x10 3/mm (ref 150–440)
RBC: 5.32 10*6/uL — AB (ref 3.80–5.20)
RDW: 15.1 % — ABNORMAL HIGH (ref 11.5–14.5)
WBC: 7.7 x10 3/mm (ref 3.6–11.0)

## 2014-03-23 ENCOUNTER — Ambulatory Visit: Payer: Self-pay | Admitting: Oncology

## 2014-06-05 ENCOUNTER — Ambulatory Visit
Admit: 2014-06-05 | Disposition: A | Discharge status: Home / Self Care | Payer: Self-pay | Attending: Oncology | Admitting: Oncology

## 2014-06-22 ENCOUNTER — Ambulatory Visit
Admit: 2014-06-22 | Disposition: A | Discharge status: Home / Self Care | Payer: Self-pay | Attending: Oncology | Admitting: Oncology

## 2014-07-06 ENCOUNTER — Other Ambulatory Visit: Payer: Self-pay | Admitting: Oncology

## 2014-07-06 DIAGNOSIS — Z1231 Encounter for screening mammogram for malignant neoplasm of breast: Secondary | ICD-10-CM

## 2014-07-14 NOTE — Consult Note (Signed)
Reason for Visit: This 69 year old Female patient presents to the clinic for initial evaluation of  bbreast cancer .   Referred by Dr. Tamala Julian.  Diagnosis:  Chief Complaint/Diagnosis   77 roll female with stage I (T1 B. N0 M0) invasive mammary carcinoma ER/PR positive HER-2/neu not over expressed status post wide local excision and sentinel node biopsy with Oncotype DX pending  Pathology Report pathology report reviewed   Imaging Report mmammograms reviewed   Referral Report cclinical notes reviewed   Planned Treatment Regimen whole breast radiation plus boost followed by aromatase inhibitor therapy   HPI   patient is a pleasant 69 year old female who presents with an abnormal mammogram of the left breast showing  an ovoid mass at the 6:00 position of the left breast 2 cm from the nipple with coarse heterogeneous microcalcifications. Ultrasound guided core biopsy was recommended and performed and positive for invasive mammary carcinoma.she will not have a wide local excision and sentinel node biopsy for a 1 cm grade 3 invasive mammary carcinoma. Tumor was ER/PR positive HER-2/neu negative one sentinel lymph node was negative. 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 She has done well postoperatively. She specifically denies breast tenderness cough or bone pain.she has had 8 Oncotype DX performed and we are pending those results.  Past, Family and Social History:  Past Medical History positive   Cardiovascular hyperlipidemia; hypertension   Past Surgical History ganglion cyst removed, skin cancer removed   Family History positive   Family History Comments family history of TIA, hypertension, adult onset diabetes and father with colon cancer.   Social History positive   Social History Comments 30 pack year smoking history quit 1996 social EtOH use history   Additional Past Medical and Surgical History seen by herself  today   Allergies:   Metoprolol: SOB  sodiun penithol- blood clots in arm: Other  Home Meds:  Home Medications: Medication Instructions Status  LORazepam 0.5 mg oral tablet 1 tab(s) orally every 6 hours, As Needed - for Anxiety, Nervousness Active  citalopram 20 mg oral tablet 1 tab(s) orally once a day Active  busPIRone 10 mg oral tablet 1  orally 2 times a day Active  Diltiazem Hydrochloride CD 180 mg/24 hours oral capsule, extended release 1 cap(s) orally once a day Active  hydrochlorothiazide-triamterene 25 mg-37.5 mg oral capsule 1 cap(s) orally once a day Active  pravastatin 40 mg oral tablet 1 tab(s) orally once a day (at bedtime) Active   Review of Systems:  General negative   Performance Status (ECOG) 0   Skin negative   Breast see HPI   Ophthalmologic negative   ENMT negative   Respiratory and Thorax negative   Cardiovascular negative   Gastrointestinal negative   Genitourinary negative   Musculoskeletal negative   Neurological negative   Psychiatric negative   Hematology/Lymphatics negative   Endocrine negative   Allergic/Immunologic negative   Review of Systems   denies any weight loss, fatigue, weakness, fever, chills or night sweats. Patient denies any loss of vision, blurred vision. Patient denies any ringing  of the ears or hearing loss. No irregular heartbeat. Patient denies heart murmur or history of fainting. Patient denies any chest pain or pain radiating to her upper extremities. Patient denies any shortness of breath, difficulty breathing at night, cough or hemoptysis. Patient denies any swelling in the lower legs. Patient denies any nausea vomiting, vomiting of blood, or coffee ground  material in the vomitus. Patient denies any stomach pain. Patient states has had normal bowel movements no significant constipation or diarrhea. Patient denies any dysuria, hematuria or significant nocturia. Patient denies any problems walking, swelling in the  joints or loss of balance. Patient denies any skin changes, loss of hair or loss of weight. Patient denies any excessive worrying or anxiety or significant depression. Patient denies any problems with insomnia. Patient denies excessive thirst, polyuria, polydipsia. Patient denies any swollen glands, patient denies easy bruising or easy bleeding. Patient denies any recent infections, allergies or URI. Patient "s visual fields have not changed significantly in recent time.   Nursing Notes:  Nursing Vital Signs and Chemo Nursing Nursing Notes: *CC Vital Signs Flowsheet:   07-Aug-15 10:12  Temp Temperature 96.8  Pulse Pulse 116  Respirations Respirations 20  SBP SBP 125  DBP DBP 78  Pain Scale (0-10)  0  Current Weight (kg) (kg) 99.9  Height (cm) centimeters 164  BSA (m2) 2   Physical Exam:  General/Skin/HEENT:  General normal   Skin normal   Eyes normal   ENMT normal   Head and Neck normal   Additional PE a well-developed obese female in NAD. She is a well healed incision around the nipple area complex. No dominant mass or nodularity is noted in either breast in 2 positions examined. No axillary or supraclavicular adenopathy is identified. Lungs are clear to A&P cardiac examination shows regular rate and rhythm.   Breasts/Resp/CV/GI/GU:  Respiratory and Thorax normal   Cardiovascular normal   Gastrointestinal normal   Genitourinary normal   MS/Neuro/Psych/Lymph:  Musculoskeletal normal   Neurological normal   Lymphatics normal   Other Results:  Radiology Results: LabUnknown:    01-Jun-15 13:56, Digital Diagnostic Mammogram Bilateral  PACS Image     01-Jun-15 14:58, MAM [place] Right Limited  PACS [place]:    01-Jun-15 13:56, Digital Diagnostic Mammogram Bilateral  Digital Diagnostic Mammogram Bilateral   REASON FOR EXAM:    RT BR MASS 3 OC AND LT BR CALC FU  COMMENTS:       PROCEDURE: MAM - MAM DGTL DIAGNOSTIC MAMMO W/CAD  - Aug 21 2013  1:56PM      ADDENDUM REPORT: 08/22/2013 09:32    ADDENDUM:  Under the findings, it should read "associated" insteadof "so  shaded" .    Measurements of the mass described at the 6 o'clock position 2 cm  from the nipple by ultrasound should read 0.5 x 0.7 x 1.1 cm.    Electronically Signed    By: Marin Olp M.D.    On: 08/22/2013 09:32    ADDENDUM REPORT: 08/21/2013 17:20    ADDENDUM:  Biopsy scheduled for Friday 08/25/2013 at 9 a.m..    Electronically Signed:  By: Marin Olp M.D.  On: 08/21/2013 17:20    CLINICAL DATA:  Patient presents for a bilateral diagnostic  evaluation to followup left breast microcalcifications as well as to  evaluate a palpable abnormality over the 3 o'clock position of the  right breast felt by her physician.    EXAM:  DIGITAL DIAGNOSTIC  bilateral MAMMOGRAM WITH CAD    ULTRASOUND OF THE bilateral BREAST    COMPARISON:  Previous exams.    ACR Breast Density Category b: There are scattered areas of  fibroglandular density.    FINDINGS:  Examination demonstrates a stable 7 mm group of rounded  microcalcifications over the outer mid to lower left breast. There  is a  8 mm group of coarse heterogeneous microcalcifications with  possible associated mass over the 6 o'clock position of the left  breast. These calcifications have decreased number although the  possible so shaded mass appears new. There is no focal abnormality  over the 3 o'clock of the right breast correspond to patient's  palpable abnormality.    Mammographic images were processed with CAD.    On physical exam, I palpate a soft superficial 8 mm mass over the 3  o'clock position of the right breast approximately 8 cm from the  nipple. I palpate no focal abnormality over the 6 o'clock position  of the left breast.  Ultrasound is performed, showing an ovoid mass with indistinct  margins at the 6 o'clock position of the left breast 2 cm from the  nipplewhich is echogenic along the  periphery and hypoechoic  centrally with microcalcifications. This measures 0.5 x 0.7 x 1  point cm. Ultrasound of the left axilla is unremarkable.    Ultrasound over the 3 o'clock position of the right breast  demonstrates an ovoid echogenic mass just below the skin measuring  0.4 x 0.9 x 1.2 cm compatible with a small lipoma.     IMPRESSION:  Ovoid mass with indistinct margins at the 6 o'clock position of the  left breast 2 cm from the nipple with associated coarse  heterogeneous microcalcifications. This likely represents fat  necrosis although malignancy cannot be excluded.  Stable 7 mm group of typically benign microcalcifications over the  outer mid to lower left breast.    Findings compatible with a lipoma over the 3 o'clock position of the  right breast 8 cm from the nipple measuring 0.4 x 0.9 x 1.2 cm  corresponding to patient's palpable abnormality.    RECOMMENDATION:  Recommend ultrasound-guided core needle biopsy of the indeterminate  mass over the6 o'clock position of the left breast. Also recommend  an additional followup diagnostic left breast mammogram in 1 year to  document 2 years of stability of the group of microcalcifications  over the outer mid to lower left breast.    I have discussed the findings and recommendations with the patient.  Results were also provided in writing at the conclusion of the  visit. If applicable, a reminder letter will be sent to the patient  regarding the next appointment.    BI-RADS CATEGORY  4: Suspicious.    Electronically Signed:  By: Marin Olp M.D.  On: 08/21/2013 15:19         Verified By: Pearletha Alfred, M.D.,   Relevent Results:   Relevant Scans and Labs mammograms and ultrasound reviewed   Assessment and Plan: Impression:   stage I invasive mammary carcinoma of the left breast status post wide local excision sentinel node biopsy ER/PR positive Oncotype DX pending Plan:   at this time we're pending her  Oncotype DX which may make a determination regarding systemic chemotherapy although with her small lesion would highly doubt she would need chemotherapy. I will plan on delivering 5000 cGy to the left breast the boosting her scar another 1600 cGy based on the close DCIS margin. Risks and benefits of treatment including skin reaction, fatigue, alteration of blood counts, and possible exclusion of some superficial lung and heart were all explained in detail to the patient. I have set her up for CT simulation in the middle of next week and we'll await the Oncotype DX results and discuss those with Dr. Grayland Ormond prior to commencing radiation.  I would like to take this opportunity for allowing me to participate in the care of your patient..  Fax to Physician:  Physicians To Recieve Fax: Kirk Ruths, MD - [phone] Rochel Brome - [phone].  Electronic Signatures: Armstead Peaks (MD)  (Signed 07-Aug-15 11:44)  Authored: HPI, Diagnosis, PFSH, Allergies, Home Meds, ROS, Nursing Notes, Physical Exam, Other Results, Relevent Results, Encounter Assessment and Plan, Fax to Physician   Last Updated: 07-Aug-15 11:44 by Armstead Peaks (MD)

## 2014-07-14 NOTE — Op Note (Signed)
PATIENT NAME:  Stacy Reese, Stacy Reese MR#:  [id number] DATE OF BIRTH:  1945/09/22  DATE OF PROCEDURE:  10/03/2013  PREOPERATIVE DIAGNOSIS: Carcinoma of the left breast.   POSTOPERATIVE DIAGNOSIS: Carcinoma of the left breast.   PROCEDURE: Left partial mastectomy with axillary sentinel lymph node biopsy.   SURGEON: Rochel Brome, M.D.   ANESTHESIA: General.   INDICATIONS: This 69 year old female recently had a mammogram depicting a nodule in the inferior aspect of the left breast which contained microcalcifications. 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 Surgery was recommended for definitive treatment.   DESCRIPTION OF PROCEDURE: The patient did have preoperative injection of radioactive technetium sulfur colloid. Also, a Kopans wire was inserted into the inferior aspect of the right breast. Mammogram images were reviewed, demonstrating the location of the barb and the location of the nodule and the biopsy marker.   The patient was placed on the operating table in the supine position under general anesthesia. The dressing was removed from the inferior aspect of the left breast exposing the Kopans wire, which entered the breast at approximately 7 o'clock position. It was cut 2 cm from the skin. The left arm was placed on a lateral arm rest. The axilla was probed with a gamma counter demonstrating location of radioactivity in the inferior aspect of the axilla. The breast, axilla and surrounding chest wall and upper arm were prepared with ChloraPrep and draped in a sterile manner.   A transversely oriented curvilinear incision was made from 4-o'clock to 8 o'clock position, just about 1.2 cm below the border of the areola. Also a narrow ellipse of skin was excised with the underlying specimen. The 8 o'clock end of the skin ellipse was tagged with a 3-0 nylon suture for the pathologist's orientation. The dissection was carried down through subcutaneous tissues and encountered the wire. A  portion of tissue surrounding the wire was excised using electrocautery for hemostasis. The mass was palpated with digital examination during the course of dissection. The portion of tissue excised was approximately 4 to 5 cm in dimension. It was oriented with the margin map suturing markers to the specimen to mark the cranial, caudal, medial, lateral, and deep margins and a small skin ellipse remained intact with nylon suture at the 8 o'clock position. This specimen was submitted for specimen mammogram and pathology was called back.  The wound was inspected. Several small bleeding points were cauterized. Hemostasis was subsequently intact.   Next, attention was turned to do the axillary sentinel lymph node biopsy. The axilla was probed again with the gamma counter demonstrating location of radioactivity in the inferior aspect of the axilla. An oblique incision was made some 5 cm in length, carried down deeply within the axilla through a large layer of adipose tissue down adjacent to the rib cage where the radioactivity was detected and a sentinel lymph node. The lymph node was dissected free from surrounding structures with some surrounding fatty tissues. The ex vivo count was in the range of 300-450 counts per second. The background count was less than 20 counts per second. There was no remaining palpable or visible mass within the axilla. The specimen was submitted for routine pathology.   Next, the breast wound was further inspected and several tiny bleeding points were cauterized. The pathologist called to report that margins appeared clear. The tissues surrounding cautery artifact were infiltrated with 0.5% Sensorcaine with epinephrine. Subcutaneous tissues were infiltrated as well.   The axillary wound was further examined and subcutaneous tissues were infiltrated with  0.5% Sensorcaine with epinephrine. The axillary wound was closed with 4-0 chromic subcutaneous sutures, and the skin was closed with a  running 3-0 Monocryl subcuticular suture. The partial mastectomy wound was further examined. Hemostasis was intact. Subcutaneous tissues were closed with interrupted inverted 4-0 chromic sutures and the skin was closed with a running 4-0 Monocryl subcuticular suture. Both wounds were treated with Dermabond. The patient tolerated surgery satisfactorily and was then prepared for transfer to the recovery room.   ____________________________ Lenna Sciara. Rochel Brome, MD jws:ds D: 10/03/2013 17:34:01 ET T: 10/03/2013 18:32:30 ET JOB#: [id number]  cc: Loreli Dollar, MD, <Dictator> Loreli Dollar MD ELECTRONICALLY SIGNED 10/05/2013 9:24

## 2014-07-30 ENCOUNTER — Ambulatory Visit
Admission: RE | Admit: 2014-07-30 | Discharge: 2014-07-30 | Disposition: A | Discharge status: Home / Self Care | Payer: Medicare Other | Source: Ambulatory Visit | Attending: Radiation Oncology | Admitting: Radiation Oncology

## 2014-07-30 ENCOUNTER — Encounter: Payer: Self-pay | Admitting: Radiation Oncology

## 2014-07-30 ENCOUNTER — Ambulatory Visit: Payer: Self-pay | Admitting: Radiation Oncology

## 2014-07-30 ENCOUNTER — Encounter (INDEPENDENT_AMBULATORY_CARE_PROVIDER_SITE_OTHER): Payer: Self-pay

## 2014-07-30 VITALS — BP 142/92 | HR 69 | Resp 20 | Ht 66.0 in | Wt 211.4 lb

## 2014-07-30 DIAGNOSIS — C50912 Malignant neoplasm of unspecified site of left female breast: Secondary | ICD-10-CM

## 2014-07-30 NOTE — Progress Notes (Signed)
Mammogram scheduled for 08/23/14.

## 2014-07-30 NOTE — Progress Notes (Signed)
Radiation Oncology Follow up Note  Name: Stacy Reese   Date:   07/30/2014 MRN:  [id number] DOB: 02/05/1946    This 69 y.o. female presents to the clinic today for follow up. Breast cancer  REFERRING PROVIDER: Dr. Tamala Julian  HPI: Patient is a 69 year old female now out over 6 months having completed radiation therapy to her left breast for a 1 cm invasive mammary carcinoma ER/PR positive HER-2/neu not overexpressed status post wide local excision and sentinel node biopsy. Seen today in routine follow-up she is doing well. She specifically denies breast tenderness cough or bone pain. Currently on aromatase inhibitor tolerating that well without side effect..  COMPLICATIONS OF TREATMENT: none  FOLLOW UP COMPLIANCE: keeps appointments   PHYSICAL EXAM:  BP 142/92 mmHg  Pulse 69  Resp 20  Ht 5' 6"  (1.676 m)  Wt 211 lb 6.7 oz (95.9 kg)  BMI 34.14 kg/m2 Well-developed well-nourished female in NAD. Lungs are clear to A&P cardiac examination essentially unremarkable with regular rate and rhythm. No dominant mass or nodularity is noted in either breast in 2 positions examined. Incision is well-healed. No axillary or supraclavicular adenopathy is appreciated. Cosmetic result is excellent.  Well-developed well-nourished patient in NAD. HEENT reveals PERLA, EOMI, discs not visualized.  Oral cavity is clear. No oral mucosal lesions are identified. Neck is clear without evidence of cervical or supraclavicular adenopathy. Lungs are clear to A&P. Cardiac examination is essentially unremarkable with regular rate and rhythm without murmur rub or thrill. Abdomen is benign with no organomegaly or masses noted. Motor sensory and DTR levels are equal and symmetric in the upper and lower extremities. Cranial nerves II through XII are grossly intact. Proprioception is intact. No peripheral adenopathy or edema is identified. No motor or sensory levels are noted. Crude visual fields are within normal range.   RADIOLOGY  RESULTS: Mammograms are scheduled for June  PLAN: At the present time she continues to do well with no evidence of disease. I am please were overall progress. She continues on aromatase inhibitor without side effect. I've asked to see her back in 1 year for follow-up. Patient is to call sooner with any concerns.  I would like to take this opportunity for allowing me to participate in the care of your patient.Armstead Peaks., MD

## 2014-08-23 ENCOUNTER — Other Ambulatory Visit: Payer: Self-pay | Admitting: Oncology

## 2014-08-23 ENCOUNTER — Ambulatory Visit
Admission: RE | Admit: 2014-08-23 | Discharge: 2014-08-23 | Disposition: A | Discharge status: Home / Self Care | Payer: Medicare Other | Source: Ambulatory Visit | Attending: Oncology | Admitting: Oncology

## 2014-08-23 DIAGNOSIS — R928 Other abnormal and inconclusive findings on diagnostic imaging of breast: Secondary | ICD-10-CM | POA: Diagnosis present

## 2014-08-23 DIAGNOSIS — Z1231 Encounter for screening mammogram for malignant neoplasm of breast: Secondary | ICD-10-CM

## 2014-08-23 IMAGING — MG MM DIAG BREAST TOMO BILATERAL
8 of 14 series · 8 of 30 positions shown · non-contrast
Comparison: Priors

CLINICAL DATA: Patient status post left breast lumpectomy.

EXAM:
DIGITAL DIAGNOSTIC BILATERAL MAMMOGRAM WITH 3D TOMOSYNTHESIS AND CAD

[L TAN]
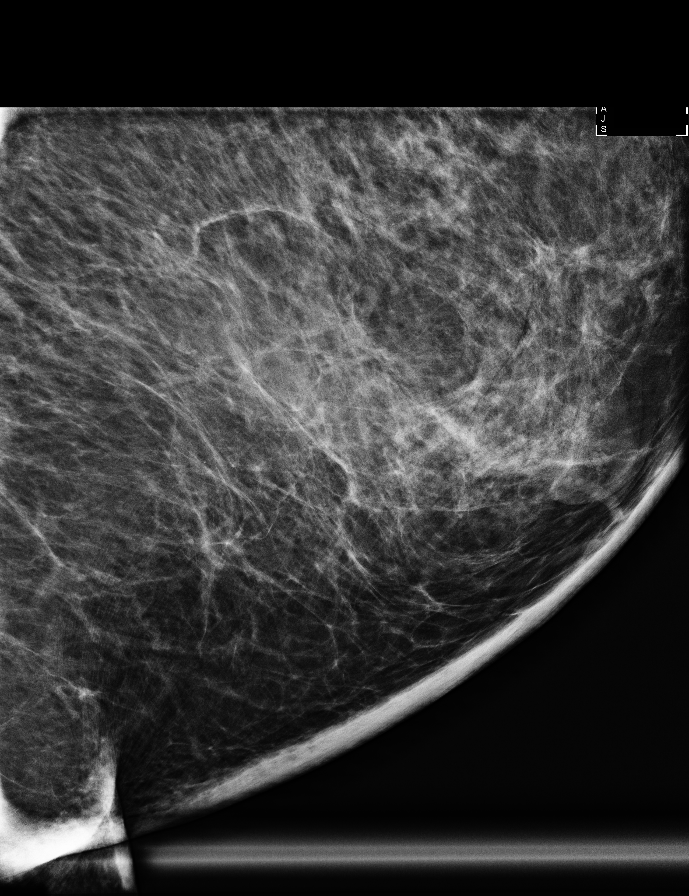

[R MLO]
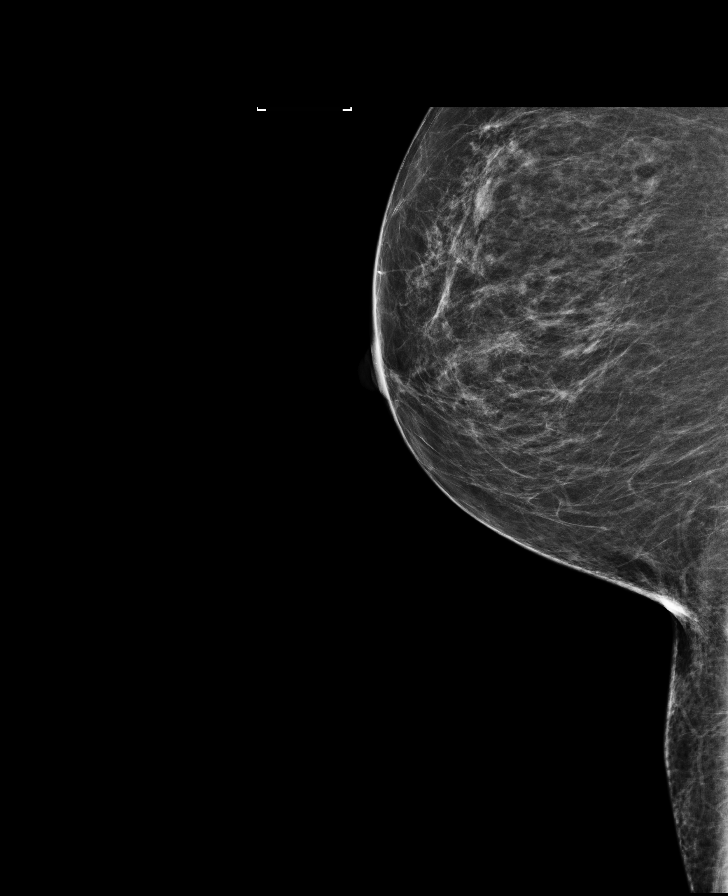

[L CC synth-2D]
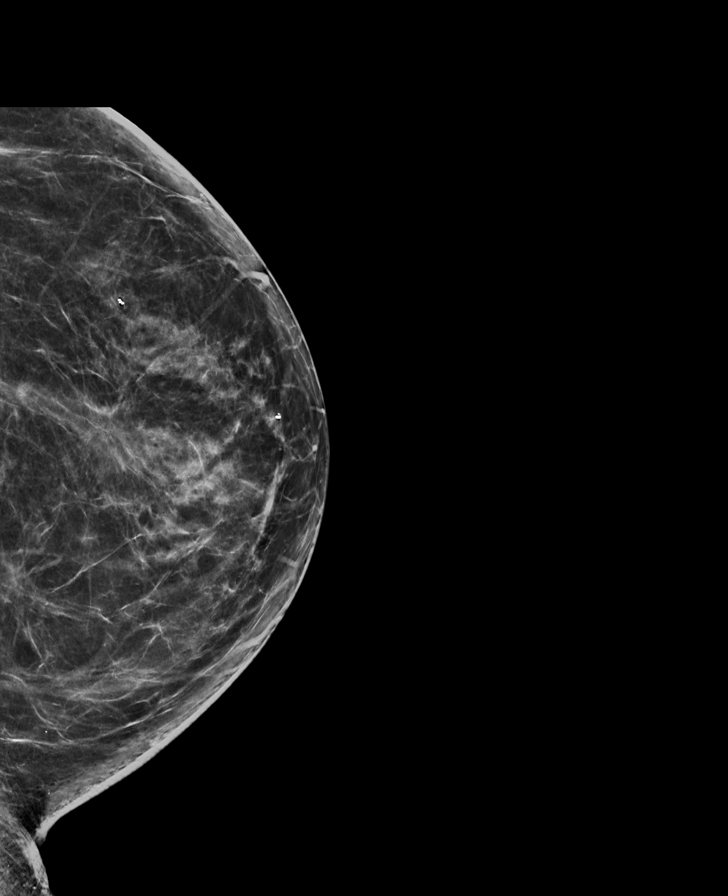

[L MLO synth-2D]
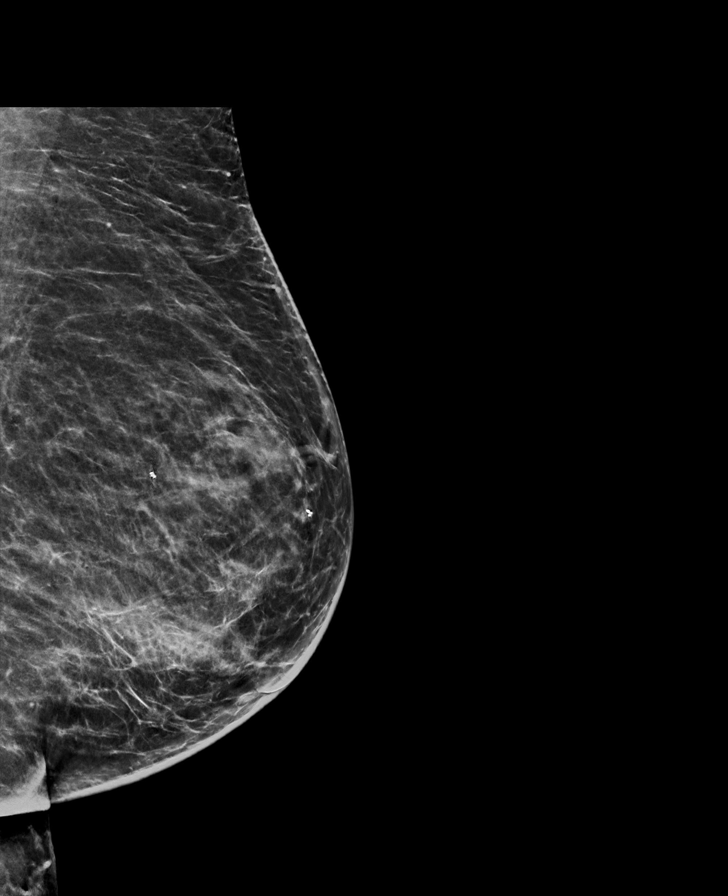

[L MLO]
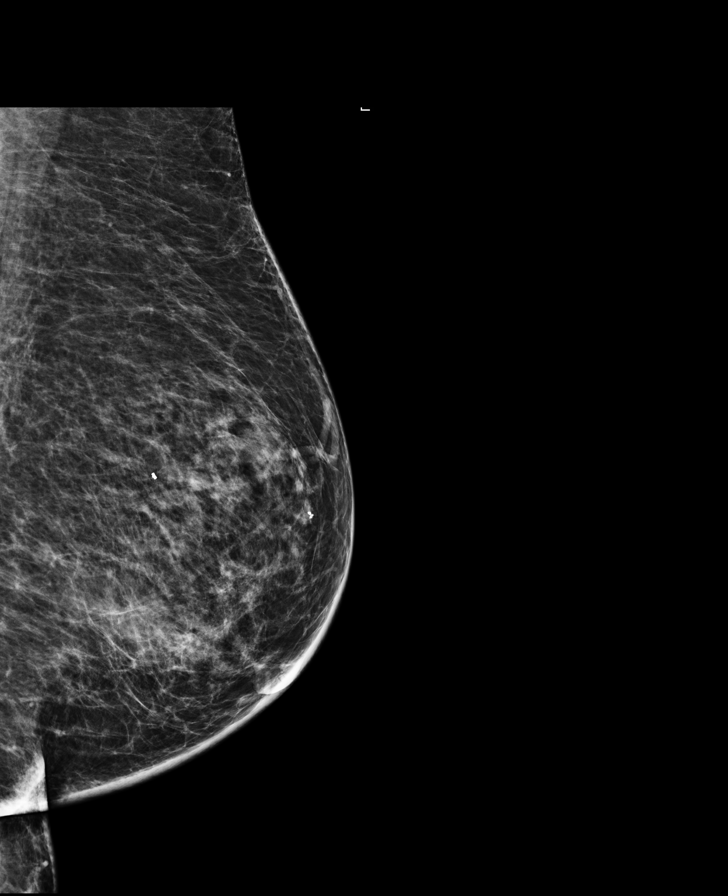

[L CC]
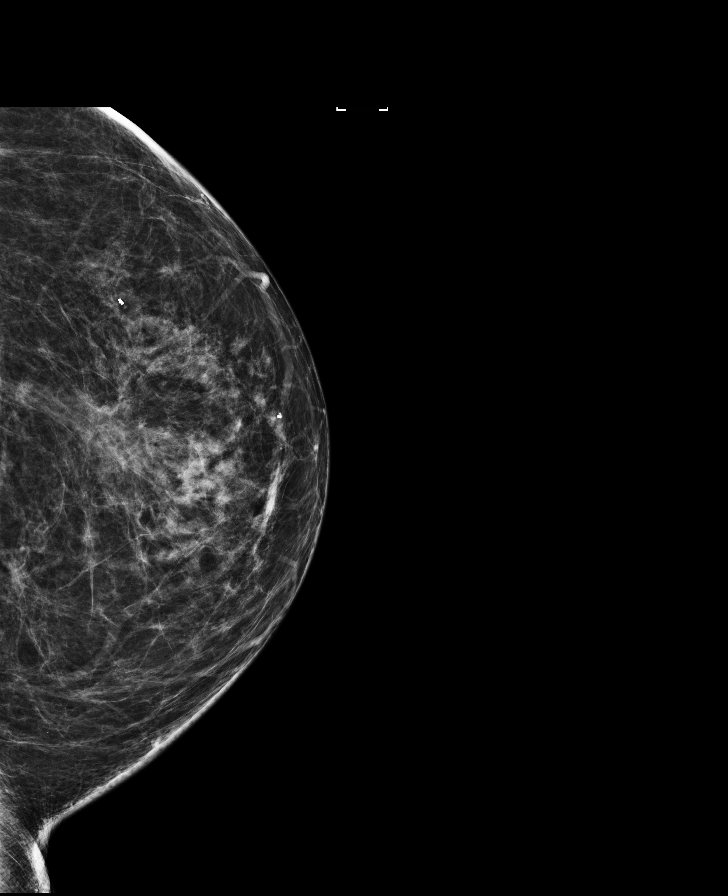

[R CC synth-2D]
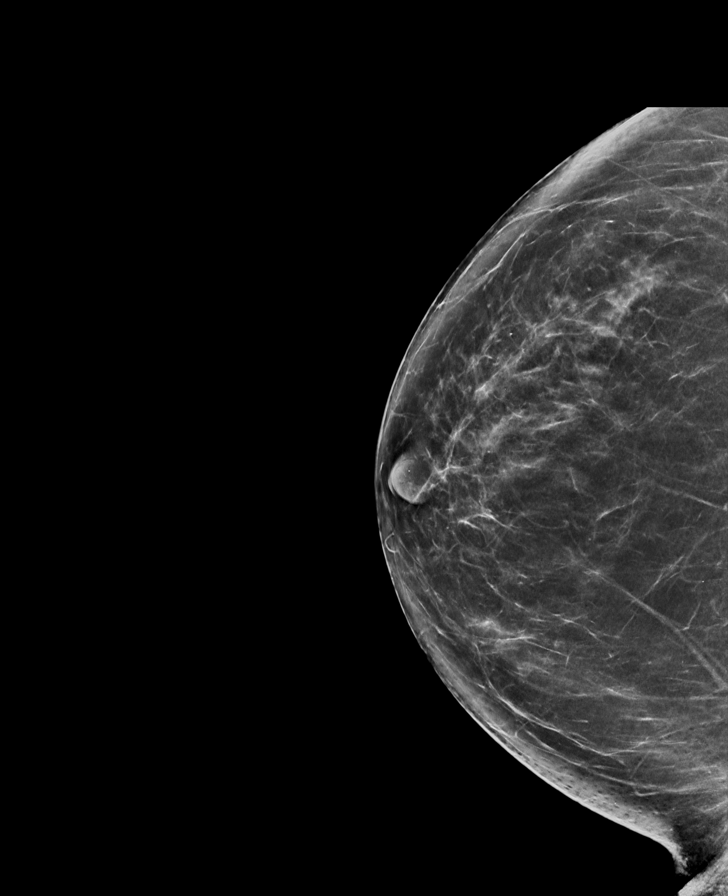

[R CC]
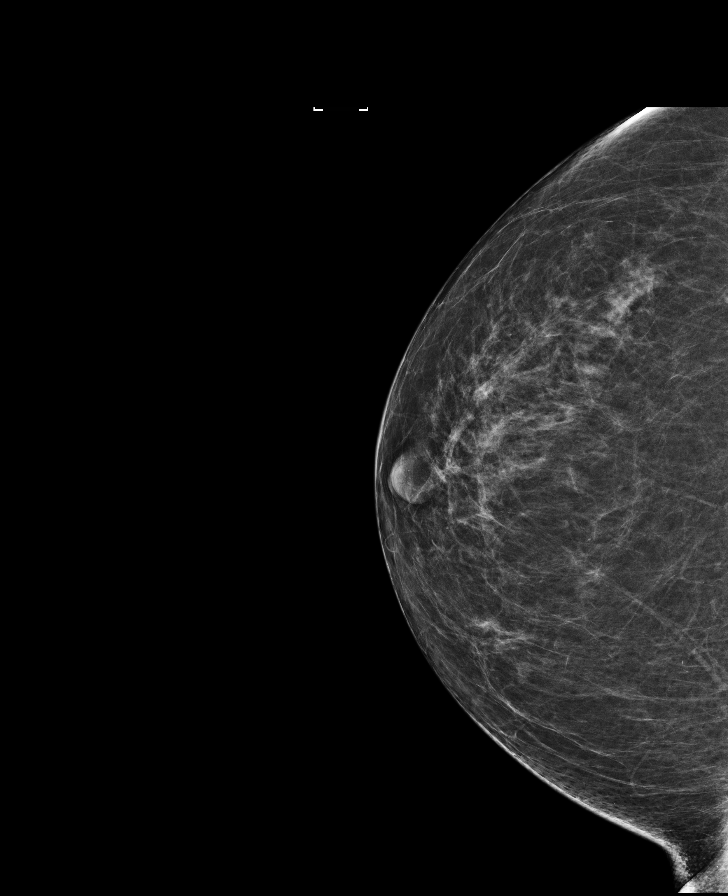

[8 of 30 positions shown; findings below may reference images not displayed]

ACR Breast Density Category c: The breast tissue is heterogeneously
dense, which may obscure small masses.
FINDINGS: Interval left breast lumpectomy and radiation changes. No concerning
masses, calcifications or nonsurgical architectural distortion
identified within either breast.

Mammographic images were processed with CAD.
IMPRESSION: Interval left lumpectomy and radiation changes.

No mammographic evidence for malignancy.

RECOMMENDATION:
Bilateral diagnostic mammography in 12 months.

I have discussed the findings and recommendations with the patient.
Results were also provided in writing at the conclusion of the
visit. If applicable, a reminder letter will be sent to the patient
regarding the next appointment.

BI-RADS CATEGORY  2: Benign.

## 2014-08-28 ENCOUNTER — Encounter: Payer: Self-pay | Admitting: Oncology

## 2014-08-28 ENCOUNTER — Inpatient Hospital Stay: Payer: Medicare Other | Attending: Oncology | Admitting: Oncology

## 2014-08-28 VITALS — BP 127/84 | HR 86 | Temp 95.6°F | Resp 18 | Wt 211.9 lb

## 2014-08-28 DIAGNOSIS — Z79811 Long term (current) use of aromatase inhibitors: Secondary | ICD-10-CM | POA: Diagnosis not present

## 2014-08-28 DIAGNOSIS — I10 Essential (primary) hypertension: Secondary | ICD-10-CM | POA: Insufficient documentation

## 2014-08-28 DIAGNOSIS — Z7982 Long term (current) use of aspirin: Secondary | ICD-10-CM | POA: Diagnosis not present

## 2014-08-28 DIAGNOSIS — Z86711 Personal history of pulmonary embolism: Secondary | ICD-10-CM | POA: Insufficient documentation

## 2014-08-28 DIAGNOSIS — E780 Pure hypercholesterolemia: Secondary | ICD-10-CM | POA: Insufficient documentation

## 2014-08-28 DIAGNOSIS — Z79899 Other long term (current) drug therapy: Secondary | ICD-10-CM | POA: Diagnosis not present

## 2014-08-28 DIAGNOSIS — Z87891 Personal history of nicotine dependence: Secondary | ICD-10-CM | POA: Insufficient documentation

## 2014-08-28 DIAGNOSIS — Z86718 Personal history of other venous thrombosis and embolism: Secondary | ICD-10-CM | POA: Diagnosis not present

## 2014-08-28 DIAGNOSIS — Z85828 Personal history of other malignant neoplasm of skin: Secondary | ICD-10-CM | POA: Insufficient documentation

## 2014-08-28 DIAGNOSIS — Z170 Estrogen receptor positive status [ER+]: Secondary | ICD-10-CM | POA: Insufficient documentation

## 2014-08-28 DIAGNOSIS — Z800 Family history of malignant neoplasm of digestive organs: Secondary | ICD-10-CM | POA: Diagnosis not present

## 2014-08-28 DIAGNOSIS — C50912 Malignant neoplasm of unspecified site of left female breast: Secondary | ICD-10-CM | POA: Diagnosis not present

## 2014-08-28 NOTE — Progress Notes (Signed)
Patient has muscle achiness that is worse at night while taking the Letrozole but the symptoms are tolerable.

## 2014-09-22 DIAGNOSIS — C50512 Malignant neoplasm of lower-outer quadrant of left female breast: Secondary | ICD-10-CM | POA: Insufficient documentation

## 2014-09-22 NOTE — Progress Notes (Signed)
Stacy Reese  [phone]) [phone] [phone]) [phone]  ID: Stacy Reese OB: 1946-01-24  MR#: [id number]  TKP#:[id number]  Patient Care Team: Kirk Ruths, MD as PCP - General (Internal Medicine)  CHIEF COMPLAINT:  Chief Complaint  Patient presents with  . Follow-up    breast cancer    INTERVAL HISTORY: Patient returns to clinic today for routine 3 month follow-up. She currently feels well and is asymptomatic. She is tolerating letrozole without significant side effects. She has no neurologic complaints. She denies any fevers. She denies any pain. She denies any chest pain or shortness of breath. She denies any constipation or diarrhea. She has no urinary complaints. Patient offers no specific complaints today.   REVIEW OF SYSTEMS:   Review of Systems  Constitutional: Negative.   Respiratory: Negative.   Cardiovascular: Negative.   Gastrointestinal: Negative.     As per HPI. Otherwise, a complete review of systems is negatve.  PAST MEDICAL HISTORY: Past Medical History  Diagnosis Date  . Hypertension   . Hypercholesterolemia   . Skin cancer     squamous cell  . 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 Squamous cell carcinoma of skin     status post exicision    PAST SURGICAL HISTORY: Past Surgical History  Procedure Laterality Date  . Breast biopsy Left 09/04/2013    negative stereo bx  . Breast biopsy Left 08/25/2013    postive [place] core bx  . Ganglion cyst excision    . Mastectomy, partial Right     FAMILY HISTORY Family History  Problem Relation Age of Onset  . Cancer Father     colon       ADVANCED DIRECTIVES:    HEALTH MAINTENANCE: History  Substance Use Topics  . Smoking status: Former Research scientist (life sciences)  . Smokeless tobacco: Never Used  . Alcohol Use: Yes     Comment: social     Colonoscopy:  PAP:  Bone density:  Lipid panel:  Allergies  Allergen Reactions  . Metoprolol Shortness  Of Breath  . Other Other (See Comments)    Sodium pentothal  Causes blood clots    Current Outpatient Prescriptions  Medication Sig Dispense Refill  . buPROPion (WELLBUTRIN SR) 150 MG 12 hr tablet Take by mouth.    . busPIRone (BUSPAR) 10 MG tablet Take 10 mg by mouth 3 (three) times daily.    . citalopram (CELEXA) 20 MG tablet Take by mouth.    . diltiazem (TIAZAC) 240 MG 24 hr capsule     . letrozole (FEMARA) 2.5 MG tablet Take 2.5 mg by mouth daily.    . pravastatin (PRAVACHOL) 40 MG tablet Take by mouth.    . rivaroxaban (XARELTO) 20 MG TABS tablet Take 20 mg by mouth daily with supper.    . triamterene-hydrochlorothiazide (MAXZIDE-25) 37.5-25 MG per tablet Take by mouth.    . zolpidem (AMBIEN) 10 MG tablet Take 10 mg by mouth at bedtime as needed for sleep.    Marland Kitchen aspirin EC 81 MG tablet Take by mouth.    . diltiazem (CARDIZEM CD) 180 MG 24 hr capsule Take by mouth.    Marland Kitchen LORazepam (ATIVAN) 1 MG tablet Take by mouth.     No current facility-administered medications for this visit.    OBJECTIVE: Filed Vitals:   08/28/14 1541  BP: 127/84  Pulse: 86  Temp: 95.6 F (35.3 C)  Resp: 18     Body mass  index is 34.21 kg/(m^2).    ECOG FS:0 - Asymptomatic  General: Well-developed, well-nourished, no acute distress. Eyes: anicteric sclera. Breasts: Patient requested exam be deferred today. Lungs: Clear to auscultation bilaterally. Heart: Regular rate and rhythm. No rubs, murmurs, or gallops. Abdomen: Soft, nontender, nondistended. No organomegaly noted, normoactive bowel sounds. Musculoskeletal: No edema, cyanosis, or clubbing. Neuro: Alert, answering all questions appropriately. Cranial nerves grossly intact. Skin: No rashes or petechiae noted. Psych: Normal affect.  LAB RESULTS:  Lab Results  Component Value Date   NA 132* 11/16/2013   K 3.3* 11/16/2013   CL 92* 11/16/2013   CO2 25 11/16/2013   GLUCOSE 168* 11/16/2013   BUN 14 11/16/2013   CREATININE 1.19 11/16/2013    CALCIUM 9.0 11/16/2013   PROT 7.6 11/16/2013   ALBUMIN 3.1* 11/16/2013   AST 34 11/16/2013   ALT 42 11/16/2013   ALKPHOS 80 11/16/2013   GFRNONAA 48* 11/16/2013   GFRAA 55* 11/16/2013    Lab Results  Component Value Date   WBC 7.7 02/20/2014   NEUTROABS 5.9 02/20/2014   HGB 15.0 02/20/2014   HCT 44.8 02/20/2014   MCV 84 02/20/2014   PLT 208 02/20/2014     STUDIES: No results found.  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  PE/DVT.  PLAN:    1. Breast cancer: Patient's Oncotype DX was high intermediate range at 28 with a recurrent score of 18%. She received one cycle of Taxotere and Cytoxan, but secondary to significant toxicity this was discontinued. No evidence of disease. Continue letrozole completing in December 2020. Her most recent mammogram on August 23, 2014 was reported as BI-RADS 2, repeat in one year. Return to clinic in 3 months for routine evaluation.  2. PE/DVT: Diagnosed at outside hospital. Patient has now completed 6 months of Xarelto. Hypercoagulable workup revealed heterozygosity for factor V Leiden. Monitor closely while patient is on letrozole. 3. Postmenopausal: Patient will require a bone mineral density in the near future.  Patient expressed understanding and was in agreement with this plan. She also understands that She can call clinic at any time with any questions, concerns, or complaints.   Breast cancer   Staging form: Breast, AJCC 7th Edition     Clinical stage from 09/22/2014: Stage IA (T1b, N0, M0) - Signed by Lloyd Huger, MD on 09/22/2014   Lloyd Huger, MD   09/22/2014 5:51 PM

## 2014-10-01 IMAGING — US US EXTREM LOW VENOUS*R*
1 series · 14 of 24 positions shown · non-contrast
Comparison: [DATE] BY REPORT ONLY

CLINICAL DATA: Popliteal pain, weakness x3 weeks, edema. History of
DVT/ PE.

EXAM:
RIGHT LOWER EXTREMITY VENOUS DOPPLER ULTRASOUND
TECHNIQUE: Gray-scale sonography with compression, as well as color and duplex
ultrasound, were performed to evaluate the deep venous system from
the level of the common femoral vein through the popliteal and
proximal calf veins.

[Series 1: us extrem low venous*right* · 0.07mm/px · 14 of 35 slices shown]
[im 1/35]
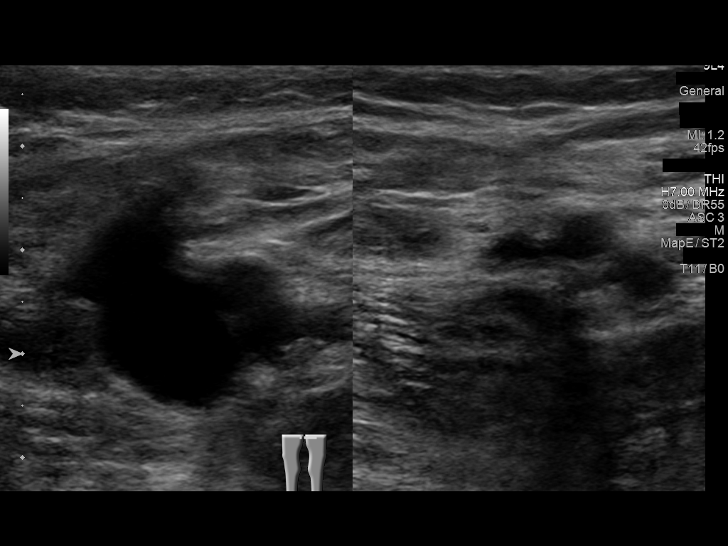
[im 3/35]
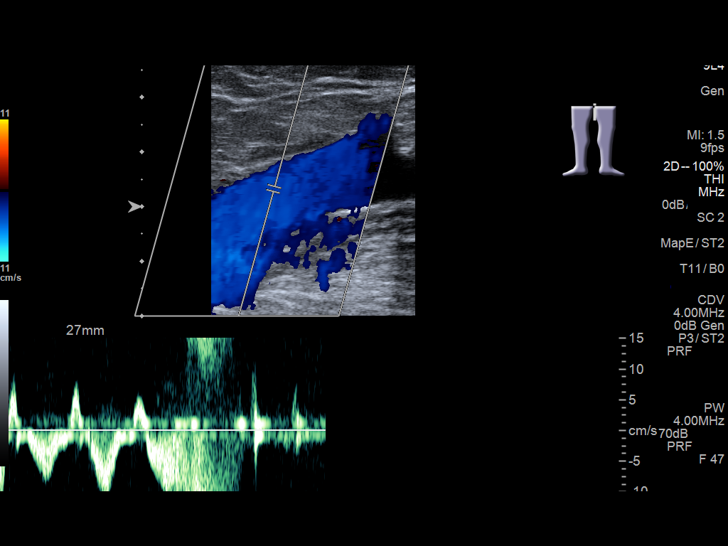
[im 6/35]
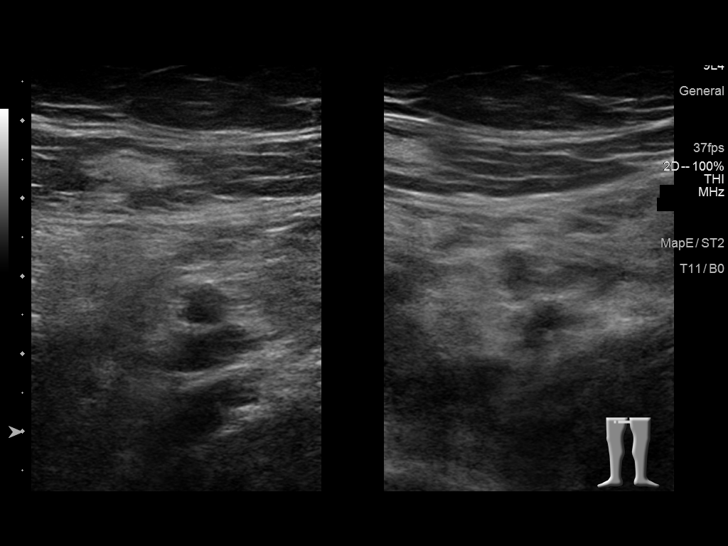
[im 9/35]
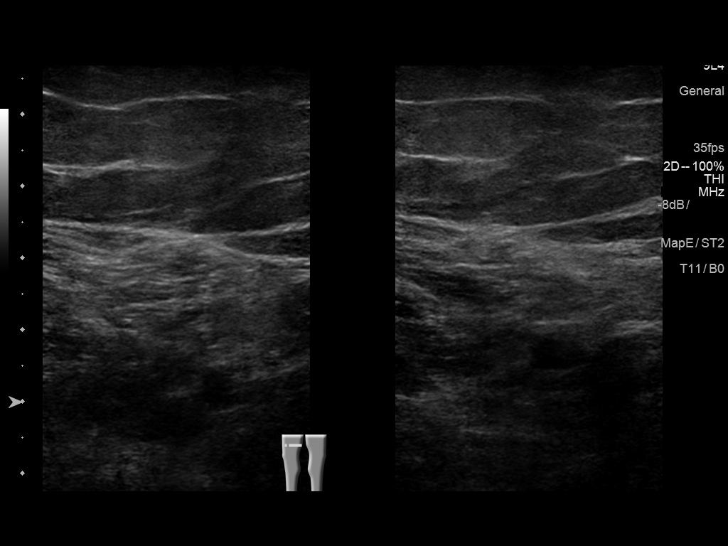
[im 11/35]
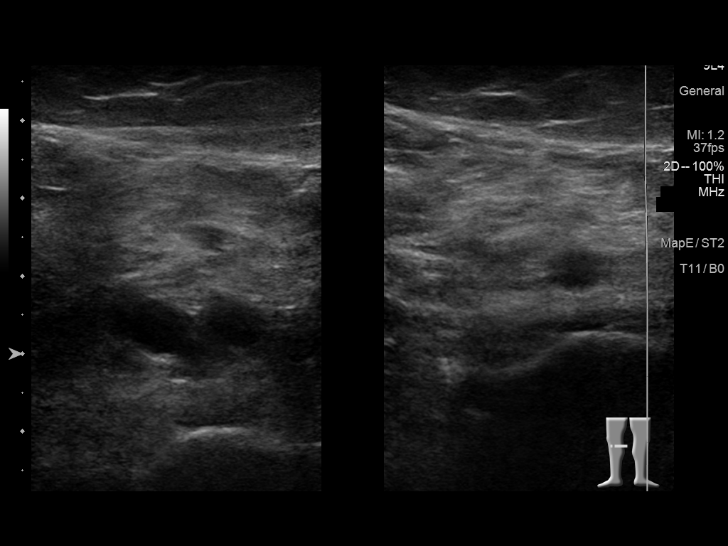
[im 14/35]
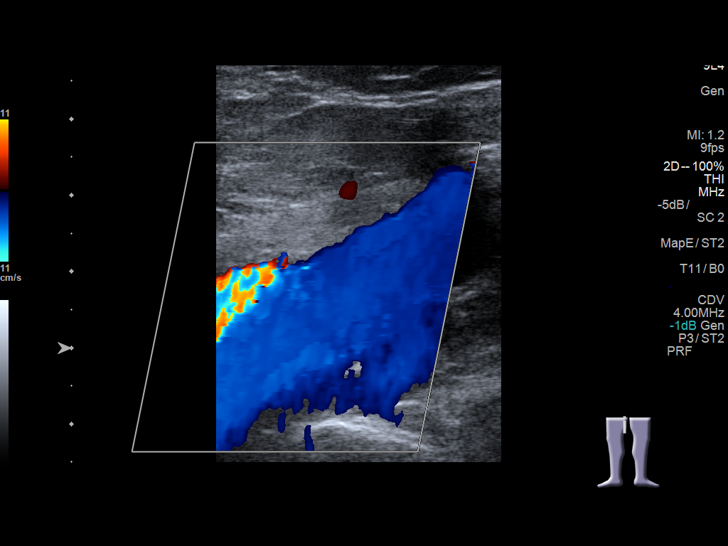
[im 17/35]
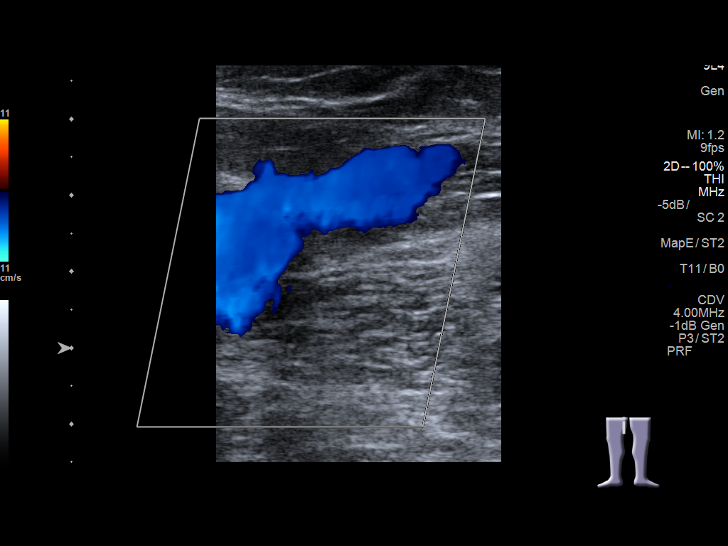
[im 18/35]
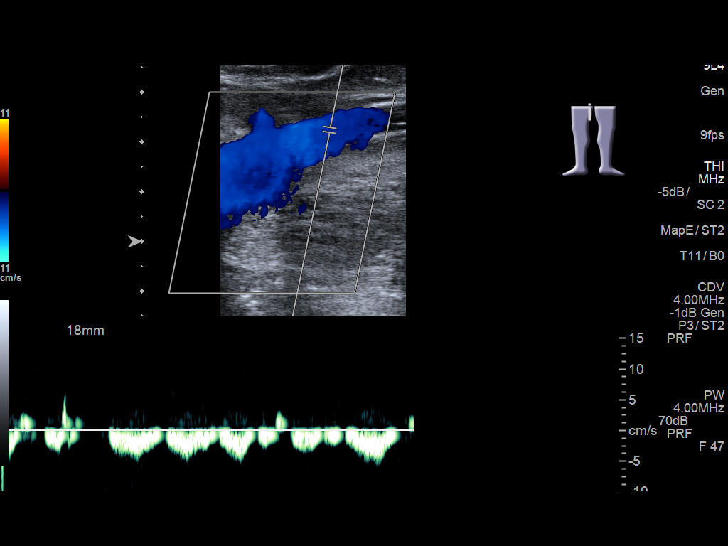
[im 21/35]
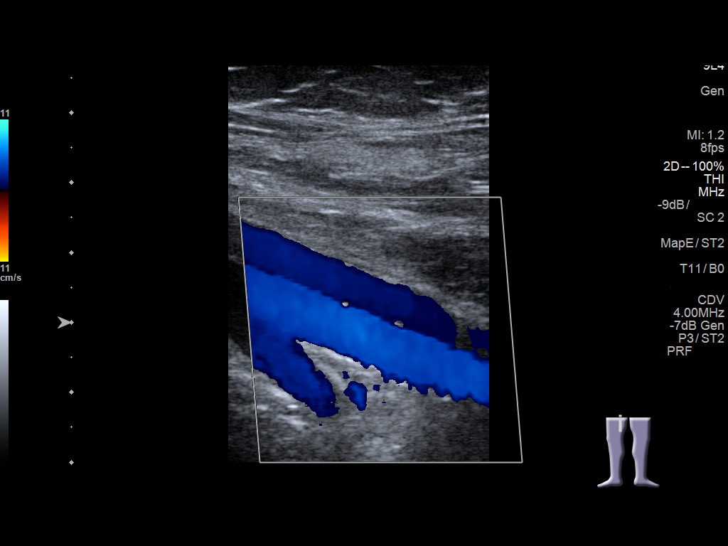
[im 24/35]
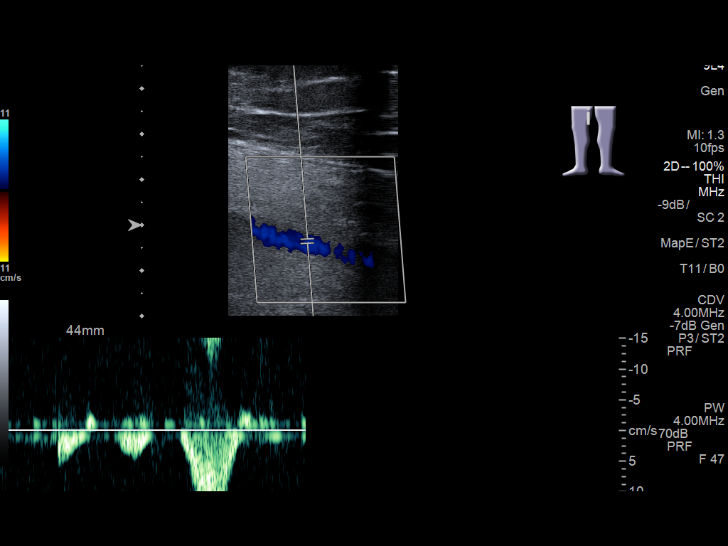
[im 27/35]
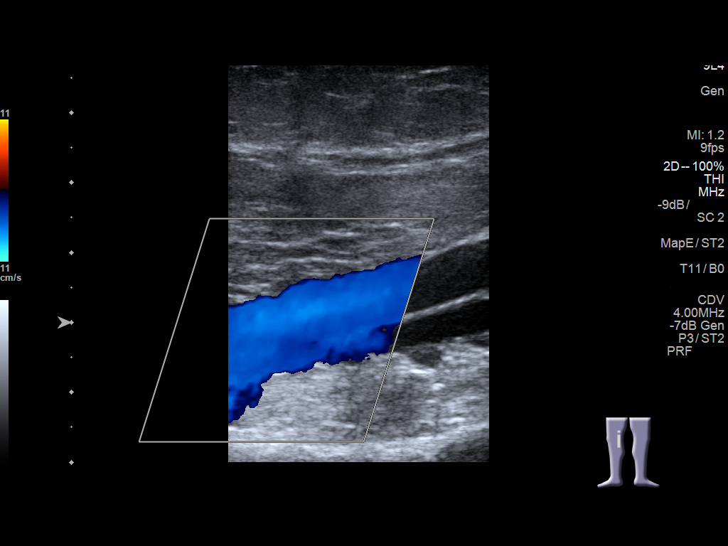
[im 29/35]
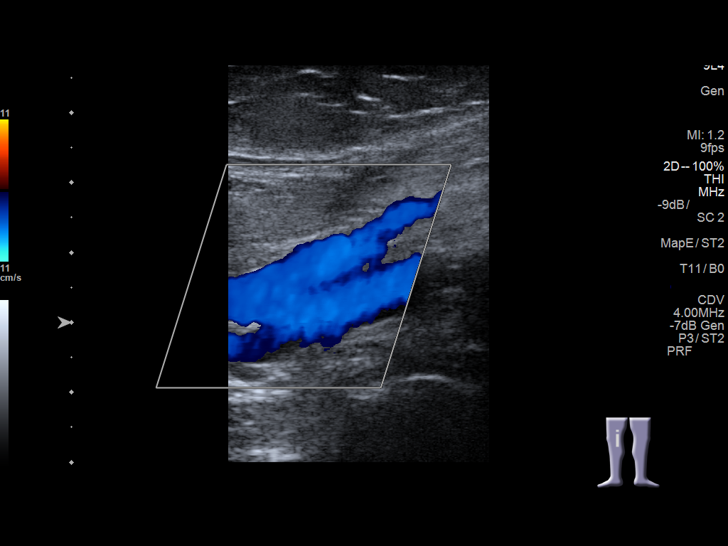
[im 32/35]
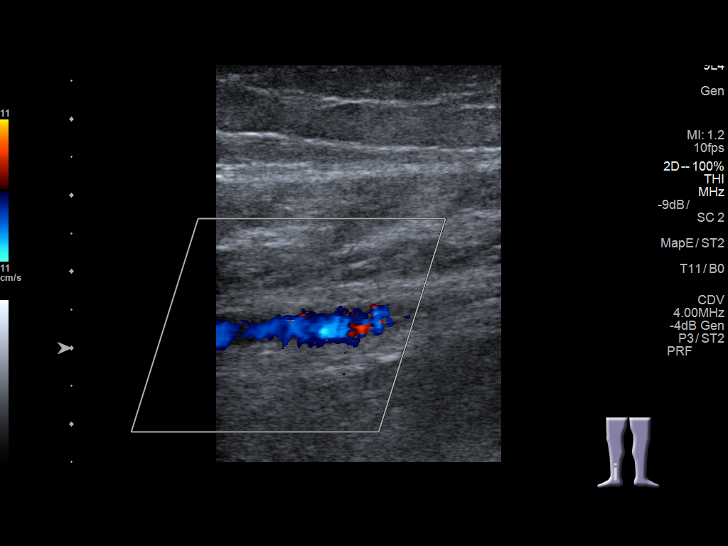
[im 35/35]
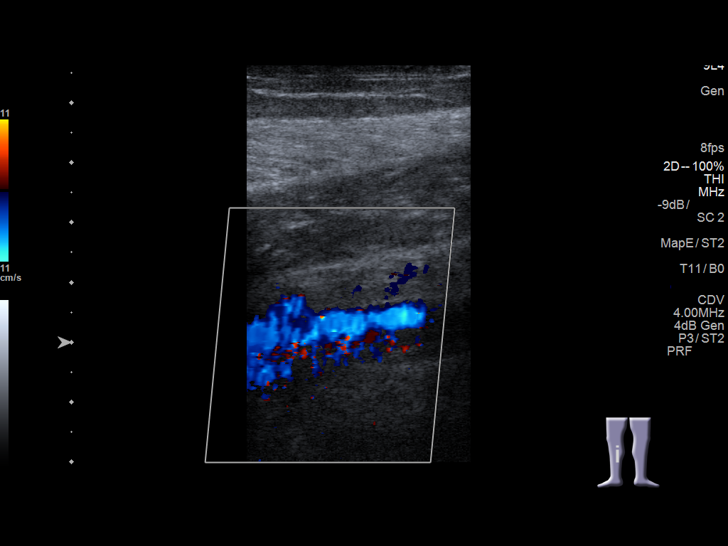

[14 of 24 positions shown; findings below may reference images not displayed]

FINDINGS: Normal compressibility of the common femoral, superficial femoral,
and popliteal veins, as well as the proximal calf veins. No filling
defects to suggest DVT on grayscale or color Doppler imaging.
Doppler waveforms show normal direction of venous flow, normal
respiratory phasicity and response to augmentation. Survey views of
the contralateral common femoral vein are unremarkable.
IMPRESSION: 1. No evidence of lower extremity deep vein thrombosis, RIGHT

## 2014-12-05 ENCOUNTER — Inpatient Hospital Stay: Payer: Medicare Other | Attending: Oncology | Admitting: Oncology

## 2014-12-05 VITALS — BP 133/96 | HR 117 | Temp 96.1°F | Resp 20 | Wt 199.3 lb

## 2014-12-05 DIAGNOSIS — Z7982 Long term (current) use of aspirin: Secondary | ICD-10-CM | POA: Diagnosis not present

## 2014-12-05 DIAGNOSIS — I10 Essential (primary) hypertension: Secondary | ICD-10-CM | POA: Diagnosis not present

## 2014-12-05 DIAGNOSIS — Z803 Family history of malignant neoplasm of breast: Secondary | ICD-10-CM | POA: Insufficient documentation

## 2014-12-05 DIAGNOSIS — Z5111 Encounter for antineoplastic chemotherapy: Secondary | ICD-10-CM | POA: Insufficient documentation

## 2014-12-05 DIAGNOSIS — E780 Pure hypercholesterolemia: Secondary | ICD-10-CM | POA: Diagnosis not present

## 2014-12-05 DIAGNOSIS — C50912 Malignant neoplasm of unspecified site of left female breast: Secondary | ICD-10-CM | POA: Insufficient documentation

## 2014-12-05 DIAGNOSIS — Z85828 Personal history of other malignant neoplasm of skin: Secondary | ICD-10-CM | POA: Insufficient documentation

## 2014-12-05 DIAGNOSIS — Z79899 Other long term (current) drug therapy: Secondary | ICD-10-CM | POA: Insufficient documentation

## 2014-12-05 DIAGNOSIS — Z87891 Personal history of nicotine dependence: Secondary | ICD-10-CM | POA: Diagnosis not present

## 2014-12-05 DIAGNOSIS — Z79811 Long term (current) use of aromatase inhibitors: Secondary | ICD-10-CM | POA: Insufficient documentation

## 2014-12-05 DIAGNOSIS — Z170 Estrogen receptor positive status [ER+]: Secondary | ICD-10-CM | POA: Insufficient documentation

## 2014-12-05 NOTE — Progress Notes (Signed)
Patient here today for routine follow up regarding breast cancer. Patient reports weakness, sweating and shortness of breath at times. Patient reports shingles to her groin, is on second round of Valtrex for treatment at this time.

## 2014-12-13 NOTE — Progress Notes (Signed)
[facility]  [phone]) [phone] [phone]) [phone]  ID: Stacy Reese OB: 05/05/1945  MR#: [id number]  ENI#:[id number]  Patient Care Team: Kirk Ruths, MD as PCP - General (Internal Medicine)  CHIEF COMPLAINT:  Chief Complaint  Patient presents with  . Breast Cancer    follow up    INTERVAL HISTORY: Patient returns to clinic today for routine 3 month follow-up. She currently feels well and is asymptomatic. She continues to tolerate letrozole without significant side effects. She has no neurologic complaints. She denies any fevers. She denies any pain. She denies any chest pain or shortness of breath. She denies any constipation or diarrhea. She has no urinary complaints. Patient offers no specific complaints today.  REVIEW OF SYSTEMS:   Review of Systems  Constitutional: Negative.   Respiratory: Negative.   Cardiovascular: Negative.   Gastrointestinal: Negative.     As per HPI. Otherwise, a complete review of systems is negatve.  PAST MEDICAL HISTORY: Past Medical History  Diagnosis Date  . Hypertension   . Hypercholesterolemia   . Skin cancer     squamous cell  . 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 Squamous cell carcinoma of skin     status post exicision    PAST SURGICAL HISTORY: Past Surgical History  Procedure Laterality Date  . Breast biopsy Left 09/04/2013    negative stereo bx  . Breast biopsy Left 08/25/2013    postive [place] core bx  . Ganglion cyst excision    . Mastectomy, partial Right     FAMILY HISTORY Family History  Problem Relation Age of Onset  . Cancer Father     colon       ADVANCED DIRECTIVES:    HEALTH MAINTENANCE: Social History  Substance Use Topics  . Smoking status: Former Research scientist (life sciences)  . Smokeless tobacco: Never Used  . Alcohol Use: Yes     Comment: social     Colonoscopy:  PAP:  Bone density:  Lipid panel:  Allergies  Allergen Reactions  .  Metoprolol Shortness Of Breath  . Other Other (See Comments)    Sodium pentothal  Causes blood clots    Current Outpatient Prescriptions  Medication Sig Dispense Refill  . aspirin EC 81 MG tablet Take by mouth.    Marland Kitchen buPROPion (WELLBUTRIN SR) 150 MG 12 hr tablet Take by mouth.    . busPIRone (BUSPAR) 10 MG tablet Take 10 mg by mouth 3 (three) times daily.    . citalopram (CELEXA) 20 MG tablet Take by mouth.    . diltiazem (CARDIZEM CD) 180 MG 24 hr capsule Take by mouth.    . diltiazem (TIAZAC) 240 MG 24 hr capsule     . letrozole (FEMARA) 2.5 MG tablet Take 2.5 mg by mouth daily.    Marland Kitchen LORazepam (ATIVAN) 1 MG tablet Take by mouth.    . pravastatin (PRAVACHOL) 40 MG tablet Take by mouth.    . rivaroxaban (XARELTO) 20 MG TABS tablet Take 20 mg by mouth daily with supper.    . triamterene-hydrochlorothiazide (MAXZIDE-25) 37.5-25 MG per tablet Take by mouth.    . valACYclovir (VALTREX) 500 MG tablet Take 500 mg by mouth 3 (three) times daily.    Marland Kitchen zolpidem (AMBIEN) 10 MG tablet Take 10 mg by mouth at bedtime as needed for sleep.     No current facility-administered medications for this visit.    OBJECTIVE: Filed Vitals:   12/05/14 1041  BP: 133/96  Pulse: 117  Temp: 96.1 F (35.6 C)  Resp: 20     Body mass index is 32.18 kg/(m^2).    ECOG FS:0 - Asymptomatic  General: Well-developed, well-nourished, no acute distress. Eyes: anicteric sclera. Breasts: Patient requested exam be deferred today. Lungs: Clear to auscultation bilaterally. Heart: Regular rate and rhythm. No rubs, murmurs, or gallops. Abdomen: Soft, nontender, nondistended. No organomegaly noted, normoactive bowel sounds. Musculoskeletal: No edema, cyanosis, or clubbing. Neuro: Alert, answering all questions appropriately. Cranial nerves grossly intact. Skin: No rashes or petechiae noted. Psych: Normal affect.  LAB RESULTS:  Lab Results  Component Value Date   NA 132* 11/16/2013   K 3.3* 11/16/2013   CL 92*  11/16/2013   CO2 25 11/16/2013   GLUCOSE 168* 11/16/2013   BUN 14 11/16/2013   CREATININE 1.19 11/16/2013   CALCIUM 9.0 11/16/2013   PROT 7.6 11/16/2013   ALBUMIN 3.1* 11/16/2013   AST 34 11/16/2013   ALT 42 11/16/2013   ALKPHOS 80 11/16/2013   BILITOT 1.0 11/16/2013   GFRNONAA 48* 11/16/2013   GFRAA 55* 11/16/2013    Lab Results  Component Value Date   WBC 7.7 02/20/2014   NEUTROABS 5.9 02/20/2014   HGB 15.0 02/20/2014   HCT 44.8 02/20/2014   MCV 84 02/20/2014   PLT 208 02/20/2014     STUDIES: No results found.  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  PE/DVT.  PLAN:    1. Breast cancer: Patient's Oncotype DX was high intermediate range at 28 with a recurrent score of 18%. She received one cycle of Taxotere and Cytoxan, but secondary to significant toxicity this was discontinued. No evidence of disease. Continue letrozole completing in December 2020. Her most recent mammogram on August 23, 2014 was reported as BI-RADS 2, repeat in one year. Return to clinic in 3 months for routine evaluation.  2. PE/DVT: Diagnosed at outside hospital. Patient has now completed 6 months of Xarelto. Hypercoagulable workup revealed heterozygosity for factor V Leiden. Monitor closely while patient is on letrozole. 3. Postmenopausal: Patient will require a bone mineral density in the near future.  Patient expressed understanding and was in agreement with this plan. She also understands that She can call clinic at any time with any questions, concerns, or complaints.   Breast cancer   Staging form: Breast, AJCC 7th Edition     Clinical stage from 09/22/2014: Stage IA (T1b, N0, M0) - Signed by Lloyd Huger, MD on 09/22/2014   Lloyd Huger, MD   12/13/2014 4:11 PM

## 2014-12-21 ENCOUNTER — Ambulatory Visit
Admission: RE | Admit: 2014-12-21 | Discharge: 2014-12-21 | Disposition: A | Discharge status: Home / Self Care | Payer: Medicare Other | Source: Ambulatory Visit | Attending: Family Medicine | Admitting: Family Medicine

## 2014-12-21 ENCOUNTER — Other Ambulatory Visit: Payer: Self-pay | Admitting: Family Medicine

## 2014-12-21 DIAGNOSIS — M79604 Pain in right leg: Secondary | ICD-10-CM

## 2014-12-21 DIAGNOSIS — M79609 Pain in unspecified limb: Secondary | ICD-10-CM

## 2015-03-06 ENCOUNTER — Inpatient Hospital Stay: Payer: Medicare Other | Attending: Oncology | Admitting: Oncology

## 2015-03-06 VITALS — BP 169/80 | HR 106 | Temp 96.9°F | Resp 20 | Wt 201.7 lb

## 2015-03-06 DIAGNOSIS — C50912 Malignant neoplasm of unspecified site of left female breast: Secondary | ICD-10-CM | POA: Diagnosis present

## 2015-03-06 DIAGNOSIS — Z79899 Other long term (current) drug therapy: Secondary | ICD-10-CM | POA: Diagnosis not present

## 2015-03-06 DIAGNOSIS — M8580 Other specified disorders of bone density and structure, unspecified site: Secondary | ICD-10-CM | POA: Diagnosis not present

## 2015-03-06 DIAGNOSIS — Z87891 Personal history of nicotine dependence: Secondary | ICD-10-CM | POA: Diagnosis not present

## 2015-03-06 DIAGNOSIS — D6851 Activated protein C resistance: Secondary | ICD-10-CM | POA: Insufficient documentation

## 2015-03-06 DIAGNOSIS — C170 Malignant neoplasm of duodenum: Secondary | ICD-10-CM | POA: Diagnosis not present

## 2015-03-06 DIAGNOSIS — Z800 Family history of malignant neoplasm of digestive organs: Secondary | ICD-10-CM | POA: Diagnosis not present

## 2015-03-06 DIAGNOSIS — I10 Essential (primary) hypertension: Secondary | ICD-10-CM | POA: Diagnosis not present

## 2015-03-06 DIAGNOSIS — Z7982 Long term (current) use of aspirin: Secondary | ICD-10-CM | POA: Diagnosis not present

## 2015-03-06 DIAGNOSIS — Z86711 Personal history of pulmonary embolism: Secondary | ICD-10-CM | POA: Diagnosis not present

## 2015-03-06 DIAGNOSIS — Z9012 Acquired absence of left breast and nipple: Secondary | ICD-10-CM | POA: Insufficient documentation

## 2015-03-06 DIAGNOSIS — Z780 Asymptomatic menopausal state: Secondary | ICD-10-CM

## 2015-03-06 DIAGNOSIS — Z170 Estrogen receptor positive status [ER+]: Secondary | ICD-10-CM | POA: Diagnosis not present

## 2015-03-06 DIAGNOSIS — Z79811 Long term (current) use of aromatase inhibitors: Secondary | ICD-10-CM | POA: Insufficient documentation

## 2015-03-06 DIAGNOSIS — E7800 Pure hypercholesterolemia, unspecified: Secondary | ICD-10-CM | POA: Diagnosis not present

## 2015-03-06 DIAGNOSIS — Z86718 Personal history of other venous thrombosis and embolism: Secondary | ICD-10-CM | POA: Diagnosis not present

## 2015-03-06 DIAGNOSIS — Z85828 Personal history of other malignant neoplasm of skin: Secondary | ICD-10-CM | POA: Diagnosis not present

## 2015-03-06 NOTE — Progress Notes (Signed)
Patient here today for routine follow up regarding breast cancer. Patient denies any concerns today.

## 2015-03-14 ENCOUNTER — Ambulatory Visit
Admission: RE | Admit: 2015-03-14 | Discharge: 2015-03-14 | Disposition: A | Discharge status: Home / Self Care | Payer: Medicare Other | Source: Ambulatory Visit | Attending: Oncology | Admitting: Oncology

## 2015-03-14 DIAGNOSIS — M8580 Other specified disorders of bone density and structure, unspecified site: Secondary | ICD-10-CM | POA: Diagnosis not present

## 2015-03-14 DIAGNOSIS — Z780 Asymptomatic menopausal state: Secondary | ICD-10-CM

## 2015-03-14 DIAGNOSIS — Z13820 Encounter for screening for osteoporosis: Secondary | ICD-10-CM | POA: Insufficient documentation

## 2015-03-21 DIAGNOSIS — Z0000 Encounter for general adult medical examination without abnormal findings: Secondary | ICD-10-CM | POA: Insufficient documentation

## 2015-03-23 NOTE — Progress Notes (Signed)
[facility]  Telephone:(336) [phone] [phone]) [phone]  ID: Stacy Reese OB: 1946/03/07  MR#: [id number]  [id number]  Patient Care Team: Kirk Ruths, MD as PCP - General (Internal Medicine)  CHIEF COMPLAINT:  Chief Complaint  Patient presents with  . Breast Cancer    follow up    INTERVAL HISTORY: Patient returns to clinic today for routine 3 month follow-up. She currently feels well and is asymptomatic. She continues to tolerate letrozole without significant side effects. She has no neurologic complaints. She denies any fevers. She denies any pain. She denies any chest pain or shortness of breath. She denies any constipation or diarrhea. She has no urinary complaints. Patient offers no specific complaints today.  REVIEW OF SYSTEMS:   Review of Systems  Constitutional: Negative.  Negative for malaise/fatigue.  Respiratory: Negative.   Cardiovascular: Negative.   Gastrointestinal: Negative.   Musculoskeletal: Negative.   Neurological: Negative.  Negative for weakness.    As per HPI. Otherwise, a complete review of systems is negatve.  PAST MEDICAL HISTORY: Past Medical History  Diagnosis Date  . Hypertension   . Hypercholesterolemia   . Skin cancer     squamous cell  . 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 Squamous cell carcinoma of skin     status post exicision    PAST SURGICAL HISTORY: Past Surgical History  Procedure Laterality Date  . Breast biopsy Left 09/04/2013    negative stereo bx  . Breast biopsy Left 08/25/2013    postive [place] core bx  . Ganglion cyst excision    . Mastectomy, partial Right     FAMILY HISTORY Family History  Problem Relation Age of Onset  . Cancer Father     colon       ADVANCED DIRECTIVES:    HEALTH MAINTENANCE: Social History  Substance Use Topics  . Smoking status: Former Research scientist (life sciences)  . Smokeless tobacco: Never Used  . Alcohol Use: Yes     Comment:  social     Colonoscopy:  PAP:  Bone density:  Lipid panel:  Allergies  Allergen Reactions  . Metoprolol Shortness Of Breath  . Other Other (See Comments)    Sodium pentothal  Causes blood clots    Current Outpatient Prescriptions  Medication Sig Dispense Refill  . aspirin EC 81 MG tablet Take by mouth.    Marland Kitchen buPROPion (WELLBUTRIN SR) 150 MG 12 hr tablet Take by mouth.    . busPIRone (BUSPAR) 10 MG tablet Take 10 mg by mouth 3 (three) times daily.    . citalopram (CELEXA) 20 MG tablet Take by mouth.    . diltiazem (CARDIZEM CD) 180 MG 24 hr capsule Take by mouth.    . diltiazem (TIAZAC) 240 MG 24 hr capsule     . letrozole (FEMARA) 2.5 MG tablet Take 2.5 mg by mouth daily.    Marland Kitchen LORazepam (ATIVAN) 1 MG tablet Take by mouth.    . pravastatin (PRAVACHOL) 40 MG tablet Take by mouth.    . rivaroxaban (XARELTO) 20 MG TABS tablet Take 20 mg by mouth daily with supper.    . triamterene-hydrochlorothiazide (MAXZIDE-25) 37.5-25 MG per tablet Take by mouth.    . valACYclovir (VALTREX) 500 MG tablet Take 500 mg by mouth 3 (three) times daily.    Marland Kitchen zolpidem (AMBIEN) 10 MG tablet Take 10 mg by mouth at bedtime as needed for sleep.     No current facility-administered medications  for this visit.    OBJECTIVE: Filed Vitals:   03/06/15 1042  BP: 169/80  Pulse: 106  Temp: 96.9 F (36.1 C)  Resp: 20     Body mass index is 32.57 kg/(m^2).    ECOG FS:0 - Asymptomatic  General: Well-developed, well-nourished, no acute distress. Eyes: anicteric sclera. Breasts: Patient requested exam be deferred today. Lungs: Clear to auscultation bilaterally. Heart: Regular rate and rhythm. No rubs, murmurs, or gallops. Abdomen: Soft, nontender, nondistended. No organomegaly noted, normoactive bowel sounds. Musculoskeletal: No edema, cyanosis, or clubbing. Neuro: Alert, answering all questions appropriately. Cranial nerves grossly intact. Skin: No rashes or petechiae noted. Psych: Normal affect.  LAB  RESULTS:  Lab Results  Component Value Date   NA 132* 11/16/2013   K 3.3* 11/16/2013   CL 92* 11/16/2013   CO2 25 11/16/2013   GLUCOSE 168* 11/16/2013   BUN 14 11/16/2013   CREATININE 1.19 11/16/2013   CALCIUM 9.0 11/16/2013   PROT 7.6 11/16/2013   ALBUMIN 3.1* 11/16/2013   AST 34 11/16/2013   ALT 42 11/16/2013   ALKPHOS 80 11/16/2013   BILITOT 1.0 11/16/2013   GFRNONAA 48* 11/16/2013   GFRAA 55* 11/16/2013    Lab Results  Component Value Date   WBC 7.7 02/20/2014   NEUTROABS 5.9 02/20/2014   HGB 15.0 02/20/2014   HCT 44.8 02/20/2014   MCV 84 02/20/2014   PLT 208 02/20/2014     STUDIES: Dg Bone Density  03/14/2015  EXAM: DUAL X-RAY ABSORPTIOMETRY (DXA) FOR BONE MINERAL DENSITY IMPRESSION: Dear Dr. Grayland Ormond, Your patient Stacy Reese completed a BMD test on 03/14/2015 using the [place] (analysis version: 14.10) manufactured by [employer]. The following summarizes the results of our evaluation. PATIENT BIOGRAPHICAL: Name: Stacy Reese, Stacy Reese Patient ID: [id number] Birth Date: 1945-04-01 Height: 63.5 in. Gender: Female Exam Date: 03/14/2015 Weight: 201.7 lbs. Indications: Caucasian, Height Loss, High Risk Meds, History of Breast Cancer, Postmenopausal, Family Hist. (Parent hip fracture) Fractures: Treatments: femara ASSESSMENT: The BMD measured at Femur Neck Left is 0.771 g/cm2 with a T-score of -1.9. This patient is considered OSTEOPENIC according to [facility]) criteria. Site Region Measured Measured WHO Young Adult BMD Date       Age      Classification T-score AP Spine L1-L4 5/14/[age] 67.3 Normal -0.1 1.178 g/cm2 DualFemur Neck Left 5/14/2015 67.3 Osteopenia -1.9 0.771 g/cm2 World Health Organization [facility]) criteria for post-menopausal, Caucasian Women: Normal:       T-score at or above -1 SD Osteopenia:   T-score between -1 and -2.5 SD Osteoporosis: T-score at or below -2.5 SD RECOMMENDATIONS: [place] recommends that  FDA-approved medical therapies be considered in postmenopausal women and men age 79 or older with a: 1. Hip or vertebral (clinical or morphometric) fracture. 2. T-score of < -2.5 at the spine or hip. 3. Ten-year fracture probability by FRAX of 3% or greater for hip fracture or 20% or greater for major osteoporotic fracture. All treatment decisions require clinical judgment and consideration of individual patient factors, including patient preferences, co-morbidities, previous drug use, risk factors not captured in the FRAX model (e.g. falls, vitamin D deficiency, increased bone turnover, interval significant decline in bone density) and possible under - or over-estimation of fracture risk by FRAX. All patients should ensure an adequate intake of dietary calcium (1200 mg/d) and vitamin D (800 IU daily) unless contraindicated. FOLLOW-UP: People with diagnosed cases of osteoporosis or at high risk for fracture should have regular bone mineral density  tests. For patients eligible for Medicare, routine testing is allowed once every 2 years. The testing frequency can be increased to one year for patients who have rapidly progressing disease, those who are receiving or discontinuing medical therapy to restore bone mass, or have additional risk factors. I have reviewed this report, and agree with the above findings. Mark A. Thornton Papas, M.D. [facility] Radiology Dear Dr. Grayland Ormond, Your patient Stacy Reese completed a FRAX assessment on 03/14/2015 using the [place] (analysis version: 14.10) manufactured by [employer]. The following summarizes the results of our evaluation. PATIENT BIOGRAPHICAL: Name: Stacy Reese, Stacy Reese Patient ID: [id number] Birth Date: 08-11-1945 Height:    63.5 in. Gender:     [age]       Weight:    201.7 lbs. Ethnicity:  White                            Exam Date: 03/14/2015 FRAX* RESULTS:  (version: 3.5) 10-year Probability of Fracture1 Major Osteoporotic Fracture2 Hip  Fracture 17.5% 2.3% Population: [place] (Caucasian) Risk Factors: Family Hist. (Parent hip fracture) Based on Femur (Left) Neck BMD 1 -The 10-year probability of fracture may be lower than reported if the patient has received treatment. 2 -Major Osteoporotic Fracture: Clinical Spine, Forearm, Hip or Shoulder *FRAX is a Materials engineer of the [employer] for Metabolic Bone Disease, a [place] (WHO) [facility]. ASSESSMENT: The probability of a major osteoporotic fracture is 17.5% within the next ten years. The probability of a hip fracture is 2.3% within the next ten years. I have reviewed this report and agree with the above findings. Mark A. Thornton Papas, M.D. [facility]iology Electronically Signed   By: Lavonia Dana M.D.   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  PE/DVT.  PLAN:    1. Breast cancer: Patient's Oncotype DX was high intermediate range at 28 with a recurrent score of 18%. She received one cycle of Taxotere and Cytoxan, but secondary to significant toxicity this was discontinued. No evidence of disease. Continue letrozole completing in December 2020. Her most recent mammogram on August 23, 2014 was reported as BI-RADS 2, repeat in one year. Return to clinic in 3 months for routine evaluation.  2. PE/DVT: Diagnosed at outside hospital. Patient has now completed 6 months of Xarelto. Hypercoagulable workup revealed heterozygosity for factor V Leiden. Monitor closely while patient is on letrozole. 3. Osteopenia: Bone mineral density from March 14, 2015 revealed a T score of -1.9. Continue calcium and vitamin D and repeat in one year.   Patient expressed understanding and was in agreement with this plan. She also understands that She can call clinic at any time with any questions, concerns, or complaints.   Breast cancer   Staging form: Breast, AJCC 7th Edition      Clinical stage from 09/22/2014: Stage IA (T1b, N0, M0) - Signed by Lloyd Huger, MD on 09/22/2014   Lloyd Huger, MD   03/23/2015 10:55 AM

## 2015-05-16 ENCOUNTER — Other Ambulatory Visit: Payer: Self-pay | Admitting: Oncology

## 2015-06-04 ENCOUNTER — Inpatient Hospital Stay: Payer: Medicare HMO | Attending: Oncology | Admitting: Oncology

## 2015-06-04 VITALS — BP 134/76 | HR 56 | Temp 96.0°F | Resp 16 | Wt 205.7 lb

## 2015-06-04 DIAGNOSIS — I10 Essential (primary) hypertension: Secondary | ICD-10-CM | POA: Diagnosis not present

## 2015-06-04 DIAGNOSIS — Z79899 Other long term (current) drug therapy: Secondary | ICD-10-CM

## 2015-06-04 DIAGNOSIS — Z87891 Personal history of nicotine dependence: Secondary | ICD-10-CM | POA: Diagnosis not present

## 2015-06-04 DIAGNOSIS — Z86718 Personal history of other venous thrombosis and embolism: Secondary | ICD-10-CM

## 2015-06-04 DIAGNOSIS — Z170 Estrogen receptor positive status [ER+]: Secondary | ICD-10-CM | POA: Diagnosis not present

## 2015-06-04 DIAGNOSIS — Z86711 Personal history of pulmonary embolism: Secondary | ICD-10-CM

## 2015-06-04 DIAGNOSIS — M8580 Other specified disorders of bone density and structure, unspecified site: Secondary | ICD-10-CM | POA: Diagnosis not present

## 2015-06-04 DIAGNOSIS — Z79811 Long term (current) use of aromatase inhibitors: Secondary | ICD-10-CM | POA: Diagnosis not present

## 2015-06-04 DIAGNOSIS — Z7982 Long term (current) use of aspirin: Secondary | ICD-10-CM

## 2015-06-04 DIAGNOSIS — C50912 Malignant neoplasm of unspecified site of left female breast: Secondary | ICD-10-CM | POA: Diagnosis present

## 2015-06-04 DIAGNOSIS — Z7901 Long term (current) use of anticoagulants: Secondary | ICD-10-CM | POA: Diagnosis not present

## 2015-06-04 DIAGNOSIS — Z800 Family history of malignant neoplasm of digestive organs: Secondary | ICD-10-CM | POA: Diagnosis not present

## 2015-06-04 NOTE — Progress Notes (Signed)
Patient does not offer any problems today.  

## 2015-06-22 NOTE — Progress Notes (Signed)
[place]  Telephone:(336) [phone] Fax:(336) [phone]  ID: Stacy Reese OB: 1945/12/07  MR#: [id number]  [id number]  Patient Care Team: Kirk Ruths, MD as PCP - General (Internal Medicine)  CHIEF COMPLAINT:  Chief Complaint  Patient presents with  . Breast Cancer    INTERVAL HISTORY: Patient returns to clinic today for routine 3 month follow-up. She continues to tolerate letrozole without significant side effects. She has no neurologic complaints. She denies any fevers. She denies any pain. She denies any chest pain or shortness of breath. She denies any nausea, vomiting, constipation or diarrhea. She has no urinary complaints. Patient offers no specific complaints today.  REVIEW OF SYSTEMS:   Review of Systems  Constitutional: Negative for fever, weight loss and malaise/fatigue.  Respiratory: Negative.  Negative for shortness of breath.   Cardiovascular: Negative.  Negative for chest pain.  Gastrointestinal: Negative.   Musculoskeletal: Negative.   Neurological: Negative.  Negative for weakness.    As per HPI. Otherwise, a complete review of systems is negatve.  PAST MEDICAL HISTORY: Past Medical History  Diagnosis Date  . Hypertension   . Hypercholesterolemia   . Skin cancer     squamous cell  . 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 Squamous cell carcinoma of skin     status post exicision    PAST SURGICAL HISTORY: Past Surgical History  Procedure Laterality Date  . Breast biopsy Left 09/04/2013    negative stereo bx  . Breast biopsy Left 08/25/2013    postive [place] core bx  . Ganglion cyst excision    . Mastectomy, partial Right     FAMILY HISTORY Family History  Problem Relation Age of Onset  . Cancer Father     colon       ADVANCED DIRECTIVES:    HEALTH MAINTENANCE: Social History  Substance Use Topics  . Smoking status: Former Research scientist (life sciences)  . Smokeless tobacco: Never Used  .  Alcohol Use: Yes     Comment: social     Colonoscopy:  PAP:  Bone density:  Lipid panel:  Allergies  Allergen Reactions  . Metoprolol Shortness Of Breath  . Other Other (See Comments)    Sodium pentothal  Causes blood clots    Current Outpatient Prescriptions  Medication Sig Dispense Refill  . aspirin EC 81 MG tablet Take by mouth.    Marland Kitchen buPROPion (WELLBUTRIN SR) 150 MG 12 hr tablet Take by mouth.    . busPIRone (BUSPAR) 10 MG tablet Take 10 mg by mouth 3 (three) times daily.    . citalopram (CELEXA) 20 MG tablet Take by mouth.    . diltiazem (CARDIZEM CD) 180 MG 24 hr capsule Take by mouth.    . diltiazem (TIAZAC) 240 MG 24 hr capsule     . letrozole (FEMARA) 2.5 MG tablet TAKE ONE TABLET BY MOUTH ONCE A DAY 90 tablet 3  . LORazepam (ATIVAN) 1 MG tablet Take by mouth.    . pravastatin (PRAVACHOL) 40 MG tablet Take by mouth.    . rivaroxaban (XARELTO) 20 MG TABS tablet Take 20 mg by mouth daily with supper.    . triamterene-hydrochlorothiazide (MAXZIDE-25) 37.5-25 MG per tablet Take by mouth.    . valACYclovir (VALTREX) 500 MG tablet Take 500 mg by mouth 3 (three) times daily.    Marland Kitchen zolpidem (AMBIEN) 10 MG tablet Take 10 mg by mouth at bedtime as needed for sleep.  No current facility-administered medications for this visit.    OBJECTIVE: Filed Vitals:   06/04/15 1105  BP: 134/76  Pulse: 56  Temp: 96 F (35.6 C)  Resp: 16     Body mass index is 33.21 kg/(m^2).    ECOG FS:0 - Asymptomatic  General: Well-developed, well-nourished, no acute distress. Eyes: anicteric sclera. Breasts: Bilateral breast and axilla without lumps or masses. Lungs: Clear to auscultation bilaterally. Heart: Regular rate and rhythm. No rubs, murmurs, or gallops. Abdomen: Soft, nontender, nondistended. No organomegaly noted, normoactive bowel sounds. Musculoskeletal: No edema, cyanosis, or clubbing. Neuro: Alert, answering all questions appropriately. Cranial nerves grossly intact. Skin: No  rashes or petechiae noted. Psych: Normal affect.  LAB RESULTS:  Lab Results  Component Value Date   NA 132* 11/16/2013   K 3.3* 11/16/2013   CL 92* 11/16/2013   CO2 25 11/16/2013   GLUCOSE 168* 11/16/2013   BUN 14 11/16/2013   CREATININE 1.19 11/16/2013   CALCIUM 9.0 11/16/2013   PROT 7.6 11/16/2013   ALBUMIN 3.1* 11/16/2013   AST 34 11/16/2013   ALT 42 11/16/2013   ALKPHOS 80 11/16/2013   BILITOT 1.0 11/16/2013   GFRNONAA 48* 11/16/2013   GFRAA 55* 11/16/2013    Lab Results  Component Value Date   WBC 7.7 02/20/2014   NEUTROABS 5.9 02/20/2014   HGB 15.0 02/20/2014   HCT 44.8 02/20/2014   MCV 84 02/20/2014   PLT 208 02/20/2014     STUDIES: No results found.  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  PE/DVT.  PLAN:    1. Breast cancer: Patient's Oncotype DX was high intermediate range at 28 with a recurrent score of 18%. She received one cycle of Taxotere and Cytoxan, but secondary to significant toxicity this was discontinued. No evidence of disease. Continue letrozole completing in December 2020. Her most recent mammogram on August 23, 2014 was reported as BI-RADS 2, repeat in June 2017. Return to clinic in 6 months for routine evaluation.  2. PE/DVT: Diagnosed at outside hospital. Patient has now completed 6 months of Xarelto. Hypercoagulable workup revealed heterozygosity for factor V Leiden. Monitor closely while patient is on letrozole. 3. Osteopenia: Bone mineral density from March 14, 2015 revealed a T score of -1.9. Continue calcium and vitamin D and repeat in one year.   Patient expressed understanding and was in agreement with this plan. She also understands that She can call clinic at any time with any questions, concerns, or complaints.   Breast cancer   Staging form: Breast, AJCC 7th Edition     Clinical stage from 09/22/2014: Stage IA (T1b, N0, M0) - Signed by Lloyd Huger, MD on  09/22/2014   Lloyd Huger, MD   06/22/2015 8:10 AM

## 2015-08-12 ENCOUNTER — Inpatient Hospital Stay: Admission: RE | Admit: 2015-08-12 | Payer: Medicare HMO | Source: Ambulatory Visit | Admitting: Radiation Oncology

## 2015-08-12 ENCOUNTER — Ambulatory Visit: Payer: Medicare HMO | Admitting: Radiation Oncology

## 2015-08-26 ENCOUNTER — Ambulatory Visit
Admission: RE | Admit: 2015-08-26 | Discharge: 2015-08-26 | Disposition: A | Discharge status: Home / Self Care | Payer: Medicare HMO | Source: Ambulatory Visit | Attending: Oncology | Admitting: Oncology

## 2015-08-26 ENCOUNTER — Other Ambulatory Visit: Payer: Self-pay | Admitting: Oncology

## 2015-08-26 DIAGNOSIS — C50912 Malignant neoplasm of unspecified site of left female breast: Secondary | ICD-10-CM

## 2015-08-26 DIAGNOSIS — Z9889 Other specified postprocedural states: Secondary | ICD-10-CM | POA: Insufficient documentation

## 2015-08-26 DIAGNOSIS — Z853 Personal history of malignant neoplasm of breast: Secondary | ICD-10-CM | POA: Insufficient documentation

## 2015-08-26 DIAGNOSIS — Z08 Encounter for follow-up examination after completed treatment for malignant neoplasm: Secondary | ICD-10-CM | POA: Insufficient documentation

## 2015-08-26 IMAGING — MG MM DIGITAL DIAGNOSTIC BILAT W/ TOMO W/ CAD
8 of 17 series · 8 of 37 positions shown · non-contrast
Comparison: Previous exam(s).

CLINICAL DATA: Patient presents for a bilateral diagnostic
examination due to a history of a left malignant lumpectomy [IC].
Patient states 1 month history of focal tenderness over the left
upper outer quadrant and periareolar region.

EXAM:
2D DIGITAL DIAGNOSTIC BILATERAL MAMMOGRAM WITH CAD AND ADJUNCT TOMO
ULTRASOUND LEFT BREAST

[L ML (1 of 2)]
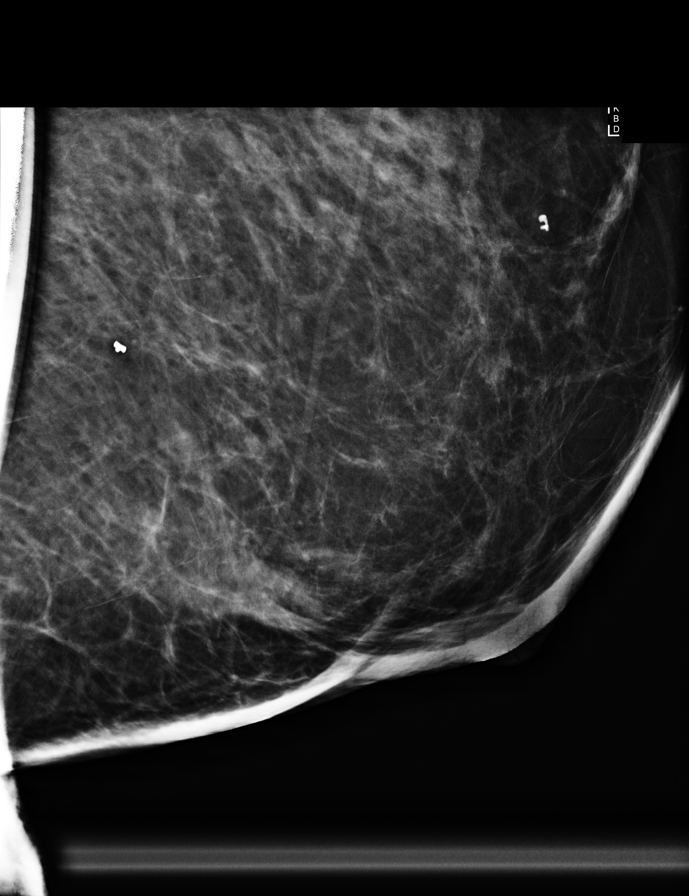

[L ML (2 of 2)]
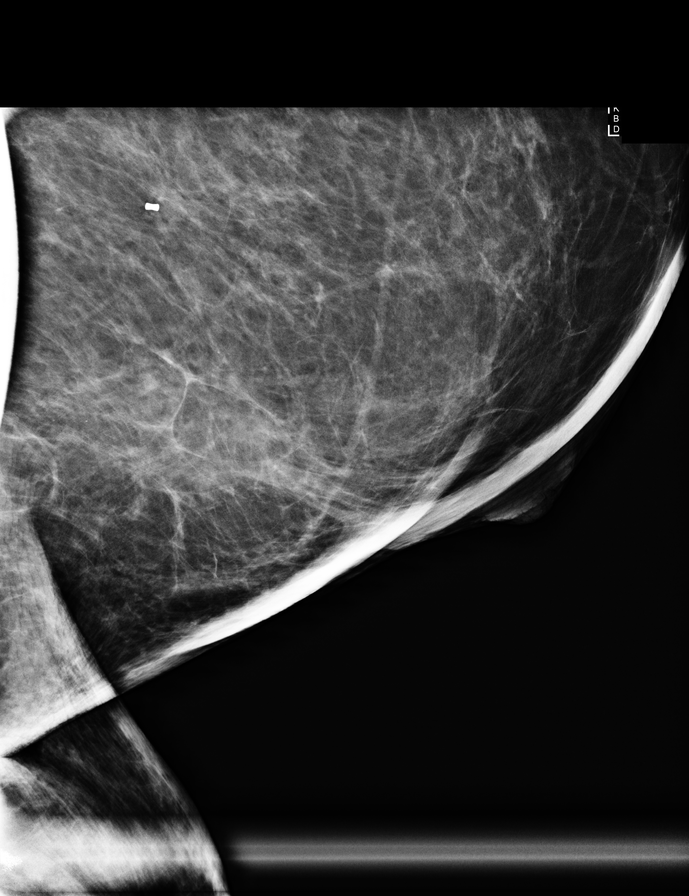

[L CC synth-2D]
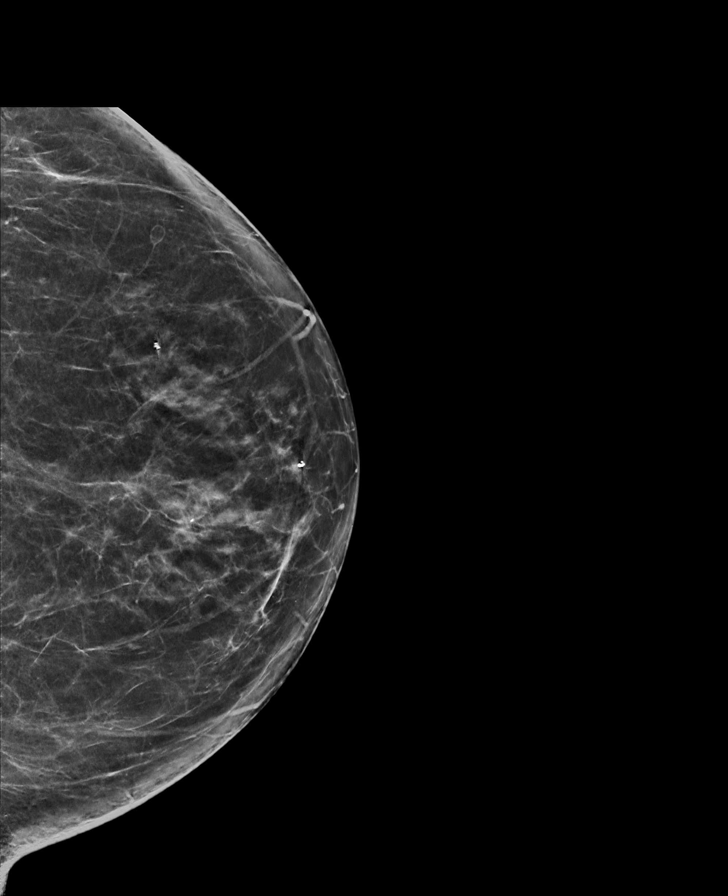

[L MLO]
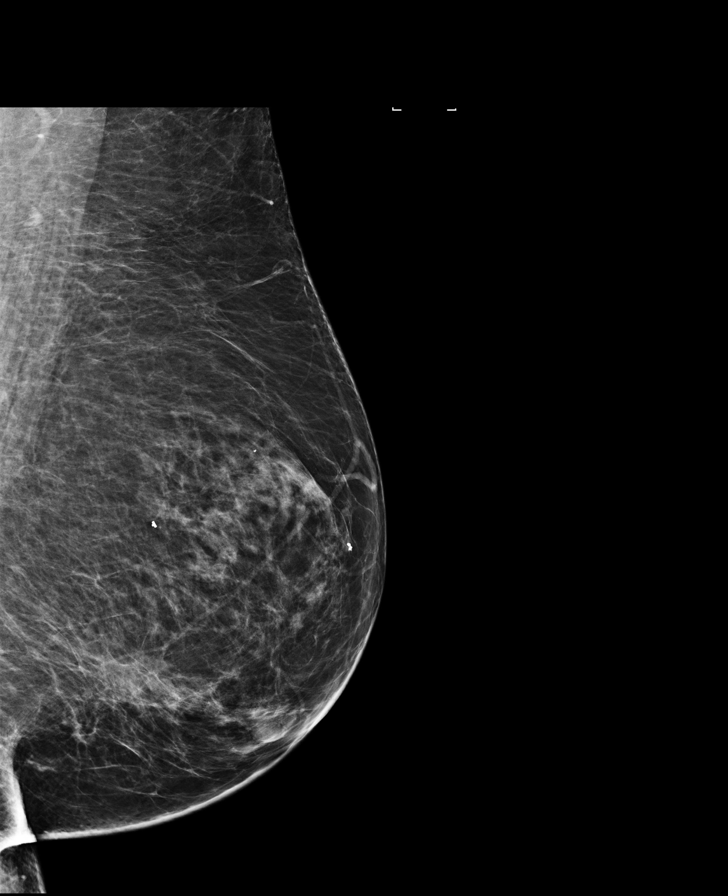

[R MLO]
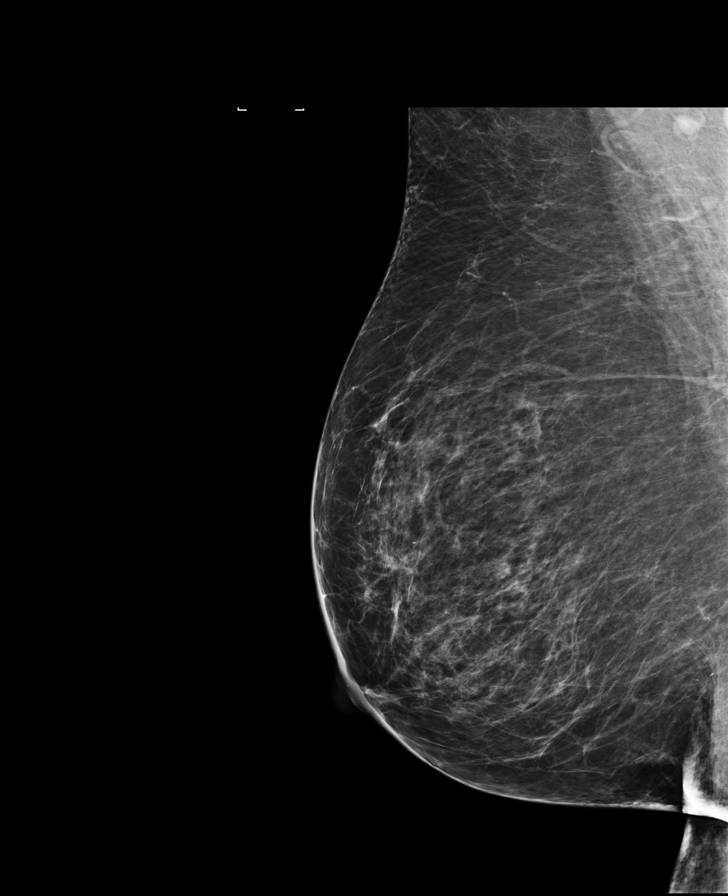

[L XCCL]
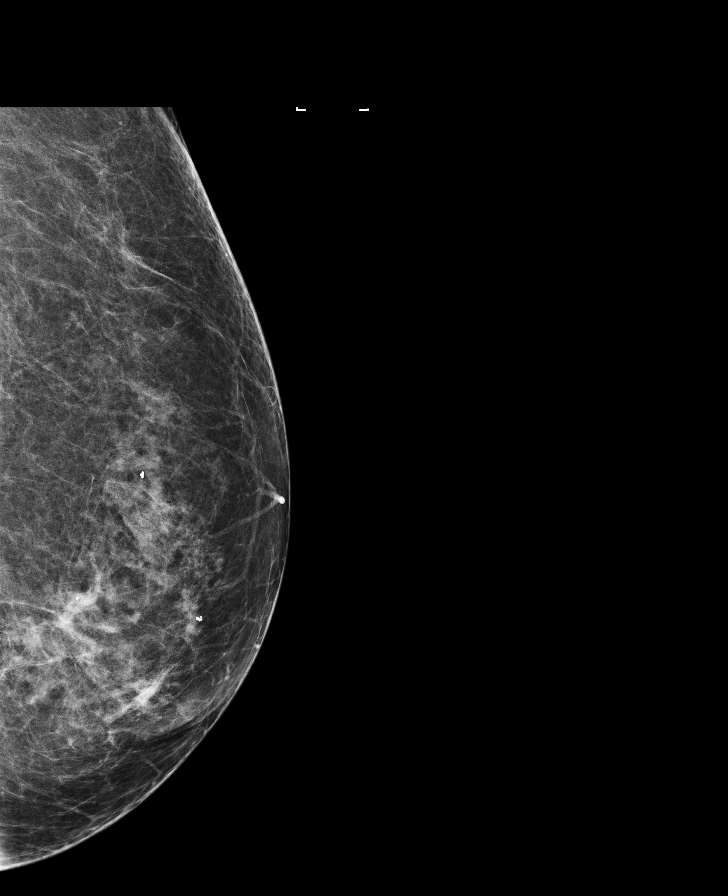

[L MLO synth-2D]
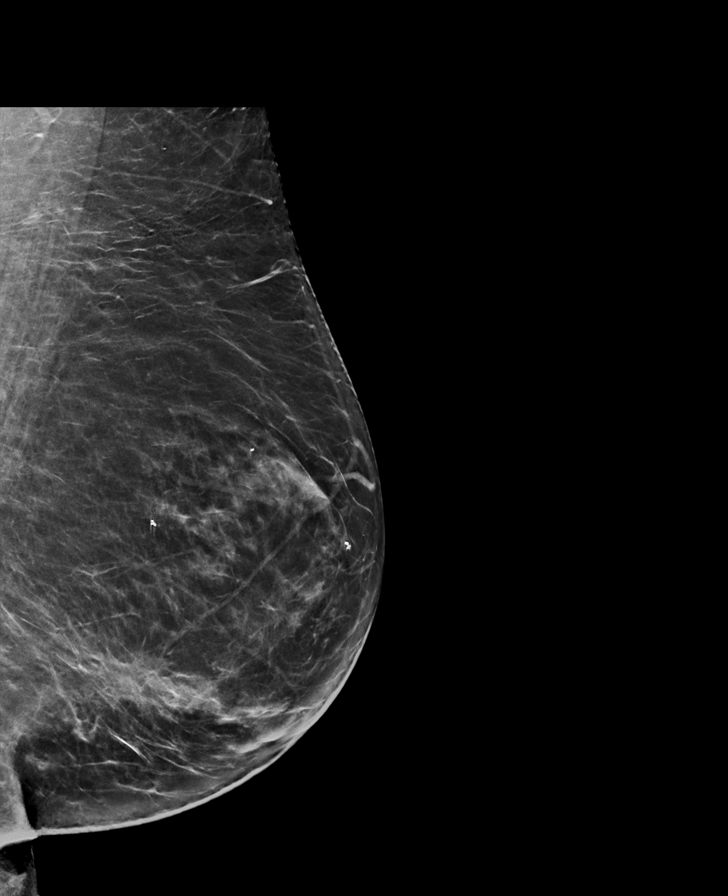

[R CC synth-2D]
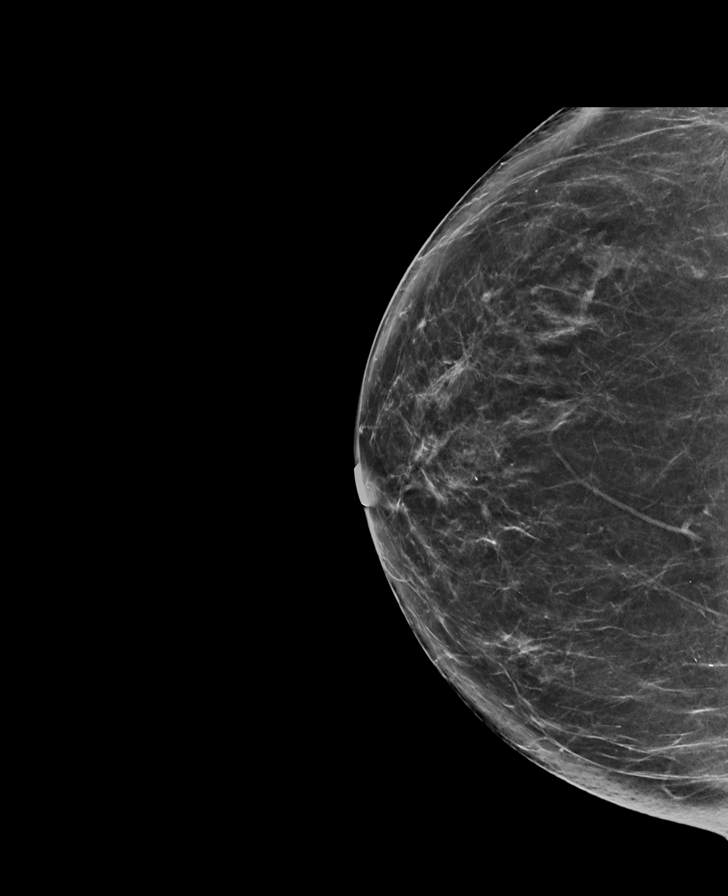

[8 of 37 positions shown; findings below may reference images not displayed]

ACR Breast Density Category b: There are scattered areas of
fibroglandular density.
FINDINGS: Examination demonstrates stable post lumpectomy changes over the
lower central left breast. No focal abnormality over the upper outer
left breast nor periareolar region to account for patient's focal
tenderness. Remainder of the exam is unchanged.

Mammographic images were processed with CAD.

Targeted ultrasound is performed, showing no focal abnormality over
the upper outer left breast nor periareolar region to account for
patient's focal tenderness.
IMPRESSION: Stable post lumpectomy changes of the left breast. No focal
abnormality over the upper outer left breast nor periareolar region
to account for patient's focal tenderness.

RECOMMENDATION:
Recommend continued management of patient's left breast focal
tenderness on a clinical basis. Otherwise, recommend continued
annual bilateral diagnostic mammographic evaluation.

I have discussed the findings and recommendations with the patient.
Results were also provided in writing at the conclusion of the
visit. If applicable, a reminder letter will be sent to the patient
regarding the next appointment.

BI-RADS CATEGORY  2: Benign.

## 2015-12-04 DIAGNOSIS — Z86711 Personal history of pulmonary embolism: Secondary | ICD-10-CM | POA: Insufficient documentation

## 2015-12-04 DIAGNOSIS — I2699 Other pulmonary embolism without acute cor pulmonale: Secondary | ICD-10-CM | POA: Insufficient documentation

## 2015-12-04 NOTE — Progress Notes (Signed)
[place]  Telephone:(336) [phone] Fax:(336) [phone]  ID: Stacy Reese OB: [birth year]  MR#: [id number]  [id number]  Patient Care Team: Kirk Ruths, MD as PCP - General (Internal Medicine)  CHIEF COMPLAINT: 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  Heterozygote factor V 5 Leiden.   INTERVAL HISTORY: Patient returns to clinic today for routine 6 month follow-up. She continues to tolerate letrozole without significant side effects. She has no neurologic complaints. She denies any fevers. She denies any pain. She denies any chest pain or shortness of breath. She denies any nausea, vomiting, constipation or diarrhea. She has no urinary complaints. Patient offers no specific complaints today.  REVIEW OF SYSTEMS:   Review of Systems  Constitutional: Negative for fever, malaise/fatigue and weight loss.  Respiratory: Negative.  Negative for cough and shortness of breath.   Cardiovascular: Negative.  Negative for chest pain.  Gastrointestinal: Negative.   Genitourinary: Negative.   Musculoskeletal: Negative.   Neurological: Negative.  Negative for weakness.  Endo/Heme/Allergies: Does not bruise/bleed easily.  Psychiatric/Behavioral: Negative.  The patient is not nervous/anxious.     As per HPI. Otherwise, a complete review of systems is negative.  PAST MEDICAL HISTORY: Past Medical History:  Diagnosis Date  . 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 Hypercholesterolemia   . Hypertension   . Skin cancer    squamous cell  . Squamous cell carcinoma of skin    status post exicision    PAST SURGICAL HISTORY: Past Surgical History:  Procedure Laterality Date  . BREAST BIOPSY Left 09/04/2013   negative stereo bx  . BREAST BIOPSY Left 08/25/2013   postive [place] core bx  . BREAST LUMPECTOMY W/ NEEDLE LOCALIZATION Left 2015  . GANGLION CYST  EXCISION      FAMILY HISTORY Family History  Problem Relation Age of Onset  . Cancer Father     colon       ADVANCED DIRECTIVES:    HEALTH MAINTENANCE: Social History  Substance Use Topics  . Smoking status: Former Research scientist (life sciences)  . Smokeless tobacco: Never Used  . Alcohol use Yes     Comment: social     Colonoscopy:  PAP:  Bone density:  Lipid panel:  Allergies  Allergen Reactions  . Metoprolol Shortness Of Breath  . Other Other (See Comments)    Sodium pentothal  Causes blood clots    Current Outpatient Prescriptions  Medication Sig Dispense Refill  . aspirin EC 81 MG tablet Take by mouth.    Marland Kitchen buPROPion (WELLBUTRIN SR) 150 MG 12 hr tablet Take by mouth.    . busPIRone (BUSPAR) 10 MG tablet Take 10 mg by mouth 3 (three) times daily.    . citalopram (CELEXA) 20 MG tablet Take by mouth.    . diltiazem (CARDIZEM CD) 180 MG 24 hr capsule Take by mouth.    . diltiazem (TIAZAC) 240 MG 24 hr capsule     . letrozole (FEMARA) 2.5 MG tablet TAKE ONE TABLET BY MOUTH ONCE A DAY 90 tablet 3  . LORazepam (ATIVAN) 1 MG tablet Take by mouth.    . pravastatin (PRAVACHOL) 40 MG tablet Take by mouth.    . rivaroxaban (XARELTO) 20 MG TABS tablet Take 20 mg by mouth daily with supper.    . triamterene-hydrochlorothiazide (MAXZIDE-25) 37.5-25 MG per tablet Take by mouth.    . zolpidem (AMBIEN) 10 MG  tablet Take 10 mg by mouth at bedtime as needed for sleep.     No current facility-administered medications for this visit.     OBJECTIVE: Vitals:   12/05/15 1048  BP: (!) 131/93  Pulse: (!) 101  Resp: 18  Temp: 98.1 F (36.7 C)     Body mass index is 33.06 kg/m.    ECOG FS:0 - Asymptomatic  General: Well-developed, well-nourished, no acute distress. Eyes: anicteric sclera. Breasts: Bilateral breast and axilla without lumps or masses. Lungs: Clear to auscultation bilaterally. Heart: Regular rate and rhythm. No rubs, murmurs, or gallops. Abdomen: Soft, nontender, nondistended. No  organomegaly noted, normoactive bowel sounds. Musculoskeletal: No edema, cyanosis, or clubbing. Neuro: Alert, answering all questions appropriately. Cranial nerves grossly intact. Skin: No rashes or petechiae noted. Psych: Normal affect.  LAB RESULTS:  Lab Results  Component Value Date   NA 132 (L) 11/16/2013   K 3.3 (L) 11/16/2013   CL 92 (L) 11/16/2013   CO2 25 11/16/2013   GLUCOSE 168 (H) 11/16/2013   BUN 14 11/16/2013   CREATININE 1.19 11/16/2013   CALCIUM 9.0 11/16/2013   PROT 7.6 11/16/2013   ALBUMIN 3.1 (L) 11/16/2013   AST 34 11/16/2013   ALT 42 11/16/2013   ALKPHOS 80 11/16/2013   BILITOT 1.0 11/16/2013   GFRNONAA 48 (L) 11/16/2013   GFRAA 55 (L) 11/16/2013    Lab Results  Component Value Date   WBC 7.7 02/20/2014   NEUTROABS 5.9 02/20/2014   HGB 15.0 02/20/2014   HCT 44.8 02/20/2014   MCV 84 02/20/2014   PLT 208 02/20/2014     STUDIES: No results found.  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  Heterozygote factor V 5 Leiden.  PLAN:    1. 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 She received one cycle of Taxotere and Cytoxan, but secondary to significant toxicity this was discontinued. No evidence of disease. Continue letrozole completing in December 2020. Her most recent mammogram on August 26, 2015 was reported as BI-RADS 2, repeat in June 2018. Return to clinic in 6 months for routine evaluation.  2. Heterozygote factor V 5 Leiden: Patient was diagnosed with PE and DVT at an outside hospital. She completed 6 months of treatment with Xarelto, but given her increased risk has elected to stay on anticoagulation long-term.  3. Osteopenia: Bone mineral density from March 14, 2015 revealed a T score of -1.9. Continue calcium and vitamin D and  repeat in December 2017.   Patient expressed understanding and was in agreement with this plan. She also understands that She can call clinic at any time with any questions, concerns, or complaints.   Breast cancer   Staging form: Breast, AJCC 7th Edition     Clinical stage from 09/22/2014: Stage IA (T1b, N0, M0) - Signed by Lloyd Huger, MD on 09/22/2014   Lloyd Huger, MD   12/09/2015 8:44 AM

## 2015-12-05 ENCOUNTER — Inpatient Hospital Stay: Payer: Medicare HMO | Attending: Oncology | Admitting: Oncology

## 2015-12-05 VITALS — BP 131/93 | HR 101 | Temp 98.1°F | Resp 18 | Wt 204.8 lb

## 2015-12-05 DIAGNOSIS — Z85828 Personal history of other malignant neoplasm of skin: Secondary | ICD-10-CM | POA: Diagnosis not present

## 2015-12-05 DIAGNOSIS — Z86711 Personal history of pulmonary embolism: Secondary | ICD-10-CM | POA: Diagnosis not present

## 2015-12-05 DIAGNOSIS — Z79899 Other long term (current) drug therapy: Secondary | ICD-10-CM | POA: Diagnosis not present

## 2015-12-05 DIAGNOSIS — C50512 Malignant neoplasm of lower-outer quadrant of left female breast: Secondary | ICD-10-CM

## 2015-12-05 DIAGNOSIS — Z7982 Long term (current) use of aspirin: Secondary | ICD-10-CM | POA: Diagnosis not present

## 2015-12-05 DIAGNOSIS — Z170 Estrogen receptor positive status [ER+]: Secondary | ICD-10-CM | POA: Diagnosis not present

## 2015-12-05 DIAGNOSIS — M8580 Other specified disorders of bone density and structure, unspecified site: Secondary | ICD-10-CM | POA: Insufficient documentation

## 2015-12-05 DIAGNOSIS — D6851 Activated protein C resistance: Secondary | ICD-10-CM | POA: Diagnosis not present

## 2015-12-05 DIAGNOSIS — Z79811 Long term (current) use of aromatase inhibitors: Secondary | ICD-10-CM | POA: Insufficient documentation

## 2015-12-05 DIAGNOSIS — Z87891 Personal history of nicotine dependence: Secondary | ICD-10-CM

## 2015-12-05 DIAGNOSIS — Z800 Family history of malignant neoplasm of digestive organs: Secondary | ICD-10-CM | POA: Diagnosis not present

## 2015-12-05 DIAGNOSIS — E7800 Pure hypercholesterolemia, unspecified: Secondary | ICD-10-CM | POA: Diagnosis not present

## 2015-12-05 DIAGNOSIS — I10 Essential (primary) hypertension: Secondary | ICD-10-CM | POA: Insufficient documentation

## 2015-12-05 DIAGNOSIS — Z86718 Personal history of other venous thrombosis and embolism: Secondary | ICD-10-CM | POA: Insufficient documentation

## 2015-12-05 DIAGNOSIS — I2699 Other pulmonary embolism without acute cor pulmonale: Secondary | ICD-10-CM

## 2015-12-05 NOTE — Progress Notes (Signed)
States is having mild tenderness in left breast.

## 2016-03-03 ENCOUNTER — Ambulatory Visit: Payer: Medicare HMO

## 2016-05-18 ENCOUNTER — Other Ambulatory Visit: Payer: Self-pay | Admitting: Oncology

## 2016-06-02 DIAGNOSIS — D6851 Activated protein C resistance: Secondary | ICD-10-CM | POA: Insufficient documentation

## 2016-06-02 NOTE — Progress Notes (Signed)
[place]  Telephone:(336) [phone] Fax:(336) [phone]  ID: Stacy Reese OB: [birth year]  MR#: [id number]  HQI#:[id number]  Patient Care Team: Kirk Ruths, MD as PCP - General (Internal Medicine)  CHIEF COMPLAINT: 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  Heterozygote factor V 5 Leiden.   INTERVAL HISTORY: Patient returns to clinic today for routine 6 month follow-up. She continues to tolerate letrozole and xarelto without significant side effects. She has no neurologic complaints. She denies any recent fevers or illnesses. She denies any pain. She denies any chest pain or shortness of breath. She denies any nausea, vomiting, constipation or diarrhea. She has no urinary complaints. Patient offers no specific complaints today.  REVIEW OF SYSTEMS:   Review of Systems  Constitutional: Negative for fever, malaise/fatigue and weight loss.  Respiratory: Negative.  Negative for cough and shortness of breath.   Cardiovascular: Negative.  Negative for chest pain.  Gastrointestinal: Negative.   Genitourinary: Negative.   Musculoskeletal: Negative.   Neurological: Negative.  Negative for weakness.  Endo/Heme/Allergies: Does not bruise/bleed easily.  Psychiatric/Behavioral: Negative.  The patient is not nervous/anxious.     As per HPI. Otherwise, a complete review of systems is negative.  PAST MEDICAL HISTORY: Past Medical History:  Diagnosis Date  . 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 Hypercholesterolemia   . Hypertension   . Skin cancer    squamous cell  . Squamous cell carcinoma of skin    status post exicision    PAST SURGICAL HISTORY: Past Surgical History:  Procedure Laterality Date  . BREAST BIOPSY Left 09/04/2013   negative stereo bx  . BREAST BIOPSY Left 08/25/2013   postive [place] core bx  . BREAST LUMPECTOMY W/ NEEDLE LOCALIZATION  Left 2015  . GANGLION CYST EXCISION      FAMILY HISTORY Family History  Problem Relation Age of Onset  . Cancer Father     colon       ADVANCED DIRECTIVES:    HEALTH MAINTENANCE: Social History  Substance Use Topics  . Smoking status: Former Research scientist (life sciences)  . Smokeless tobacco: Never Used  . Alcohol use Yes     Comment: social     Colonoscopy:  PAP:  Bone density:  Lipid panel:  Allergies  Allergen Reactions  . Metoprolol Shortness Of Breath  . Other Other (See Comments)    Sodium pentothal  Causes blood clots    Current Outpatient Prescriptions  Medication Sig Dispense Refill  . aspirin EC 81 MG tablet Take by mouth.    Marland Kitchen buPROPion (WELLBUTRIN SR) 150 MG 12 hr tablet Take by mouth.    . busPIRone (BUSPAR) 10 MG tablet Take 10 mg by mouth 3 (three) times daily.    . citalopram (CELEXA) 20 MG tablet Take by mouth.    . diltiazem (CARDIZEM CD) 180 MG 24 hr capsule Take by mouth.    . diltiazem (TIAZAC) 240 MG 24 hr capsule     . letrozole (FEMARA) 2.5 MG tablet TAKE ONE TABLET BY MOUTH ONCE A DAY 90 tablet 2  . LORazepam (ATIVAN) 1 MG tablet Take by mouth.    . pravastatin (PRAVACHOL) 40 MG tablet Take by mouth.    . rivaroxaban (XARELTO) 20 MG TABS tablet Take 20 mg by mouth daily with supper.    . triamterene-hydrochlorothiazide (MAXZIDE-25) 37.5-25 MG per tablet Take by mouth.  No current facility-administered medications for this visit.     OBJECTIVE: Vitals:   06/03/16 1048  BP: 136/83  Pulse: 87  Resp: 18  Temp: 97.6 F (36.4 C)     Body mass index is 33.95 kg/m.    ECOG FS:0 - Asymptomatic  General: Well-developed, well-nourished, no acute distress. Eyes: anicteric sclera. Breasts: Bilateral breast and axilla without lumps or masses. Lungs: Clear to auscultation bilaterally. Heart: Regular rate and rhythm. No rubs, murmurs, or gallops. Abdomen: Soft, nontender, nondistended. No organomegaly noted, normoactive bowel sounds. Musculoskeletal: No  edema, cyanosis, or clubbing. Neuro: Alert, answering all questions appropriately. Cranial nerves grossly intact. Skin: No rashes or petechiae noted. Psych: Normal affect.  LAB RESULTS:  Lab Results  Component Value Date   NA 132 (L) 11/16/2013   K 3.3 (L) 11/16/2013   CL 92 (L) 11/16/2013   CO2 25 11/16/2013   GLUCOSE 168 (H) 11/16/2013   BUN 14 11/16/2013   CREATININE 1.19 11/16/2013   CALCIUM 9.0 11/16/2013   PROT 7.6 11/16/2013   ALBUMIN 3.1 (L) 11/16/2013   AST 34 11/16/2013   ALT 42 11/16/2013   ALKPHOS 80 11/16/2013   BILITOT 1.0 11/16/2013   GFRNONAA 48 (L) 11/16/2013   GFRAA 55 (L) 11/16/2013    Lab Results  Component Value Date   WBC 7.7 02/20/2014   NEUTROABS 5.9 02/20/2014   HGB 15.0 02/20/2014   HCT 44.8 02/20/2014   MCV 84 02/20/2014   PLT 208 02/20/2014     STUDIES: No results found.  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  Heterozygote factor V 5 Leiden.  PLAN:    1. 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 She received one cycle of Taxotere and Cytoxan, but secondary to significant toxicity this was discontinued. No evidence of disease. Continue letrozole completing in December 2020. Her most recent mammogram on August 26, 2015 was reported as BI-RADS 2, repeat in June 2018. Return to clinic in 6 months for routine evaluation.  2. Heterozygote factor V 5 Leiden: Patient was diagnosed with PE and DVT at an outside hospital. She completed 6 months of treatment with Xarelto, but given her increased risk has elected to stay on anticoagulation lifelong. Patient receives her prescription for primary care.  3. Osteopenia: Bone mineral density from March 14, 2015 revealed a T score of -1.9. Continue calcium and vitamin D and repeat in the  next 1-2 weeks.  Patient expressed understanding and was in agreement with this plan. She also understands that She can call clinic at any time with any questions, concerns, or complaints.   Breast cancer   Staging form: Breast, AJCC 7th Edition     Clinical stage from 09/22/2014: Stage IA (T1b, N0, M0) - Signed by Lloyd Huger, MD on 09/22/2014   Lloyd Huger, MD   06/03/2016 1:03 PM

## 2016-06-03 ENCOUNTER — Inpatient Hospital Stay: Payer: Medicare HMO | Attending: Oncology | Admitting: Oncology

## 2016-06-03 VITALS — BP 136/83 | HR 87 | Temp 97.6°F | Resp 18 | Wt 210.3 lb

## 2016-06-03 DIAGNOSIS — Z7982 Long term (current) use of aspirin: Secondary | ICD-10-CM | POA: Diagnosis not present

## 2016-06-03 DIAGNOSIS — Z79811 Long term (current) use of aromatase inhibitors: Secondary | ICD-10-CM | POA: Diagnosis not present

## 2016-06-03 DIAGNOSIS — E7800 Pure hypercholesterolemia, unspecified: Secondary | ICD-10-CM | POA: Diagnosis not present

## 2016-06-03 DIAGNOSIS — I10 Essential (primary) hypertension: Secondary | ICD-10-CM | POA: Insufficient documentation

## 2016-06-03 DIAGNOSIS — M8580 Other specified disorders of bone density and structure, unspecified site: Secondary | ICD-10-CM | POA: Insufficient documentation

## 2016-06-03 DIAGNOSIS — Z170 Estrogen receptor positive status [ER+]: Secondary | ICD-10-CM | POA: Diagnosis not present

## 2016-06-03 DIAGNOSIS — D6851 Activated protein C resistance: Secondary | ICD-10-CM

## 2016-06-03 DIAGNOSIS — Z87891 Personal history of nicotine dependence: Secondary | ICD-10-CM | POA: Diagnosis not present

## 2016-06-03 DIAGNOSIS — Z85828 Personal history of other malignant neoplasm of skin: Secondary | ICD-10-CM | POA: Diagnosis not present

## 2016-06-03 DIAGNOSIS — Z7901 Long term (current) use of anticoagulants: Secondary | ICD-10-CM | POA: Diagnosis not present

## 2016-06-03 DIAGNOSIS — C50512 Malignant neoplasm of lower-outer quadrant of left female breast: Secondary | ICD-10-CM | POA: Diagnosis not present

## 2016-06-03 NOTE — Progress Notes (Signed)
Feeling well today. Complains of recurrent squamous cell skin lesions to LLE. Is followed by dermatology already but would like to question Dr. Grayland Ormond regarding recurrent lesions.

## 2016-07-09 ENCOUNTER — Ambulatory Visit: Payer: Medicare HMO

## 2016-07-14 ENCOUNTER — Ambulatory Visit
Admission: RE | Admit: 2016-07-14 | Discharge: 2016-07-14 | Disposition: A | Discharge status: Home / Self Care | Payer: Medicare HMO | Source: Ambulatory Visit | Attending: Oncology | Admitting: Oncology

## 2016-07-14 DIAGNOSIS — M8589 Other specified disorders of bone density and structure, multiple sites: Secondary | ICD-10-CM | POA: Diagnosis not present

## 2016-07-14 DIAGNOSIS — C50512 Malignant neoplasm of lower-outer quadrant of left female breast: Secondary | ICD-10-CM | POA: Insufficient documentation

## 2016-08-26 ENCOUNTER — Ambulatory Visit
Admission: RE | Admit: 2016-08-26 | Discharge: 2016-08-26 | Disposition: A | Discharge status: Home / Self Care | Payer: Medicare HMO | Source: Ambulatory Visit | Attending: Oncology | Admitting: Oncology

## 2016-08-26 ENCOUNTER — Other Ambulatory Visit: Payer: Self-pay | Admitting: Oncology

## 2016-08-26 DIAGNOSIS — D6851 Activated protein C resistance: Secondary | ICD-10-CM | POA: Diagnosis not present

## 2016-08-26 DIAGNOSIS — C50512 Malignant neoplasm of lower-outer quadrant of left female breast: Secondary | ICD-10-CM

## 2016-08-26 HISTORY — DX: Personal history of irradiation: Z92.3

## 2016-08-26 IMAGING — MG MM DIGITAL DIAGNOSTIC BILAT W/ TOMO W/ CAD
8 of 13 series · 8 of 29 positions shown · non-contrast
Comparison: Previous exam(s).

CLINICAL DATA: History of left breast cancer in [FF] status post
breast conservation surgery.

EXAM:
2D DIGITAL DIAGNOSTIC BILATERAL MAMMOGRAM WITH CAD AND ADJUNCT TOMO

[L CC (1 of 2)]
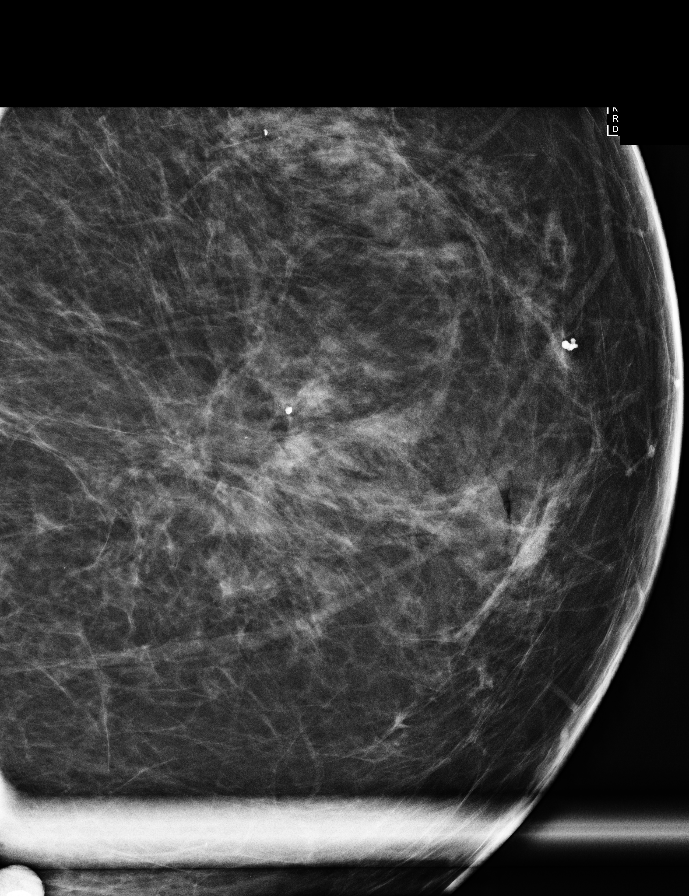

[R CC]
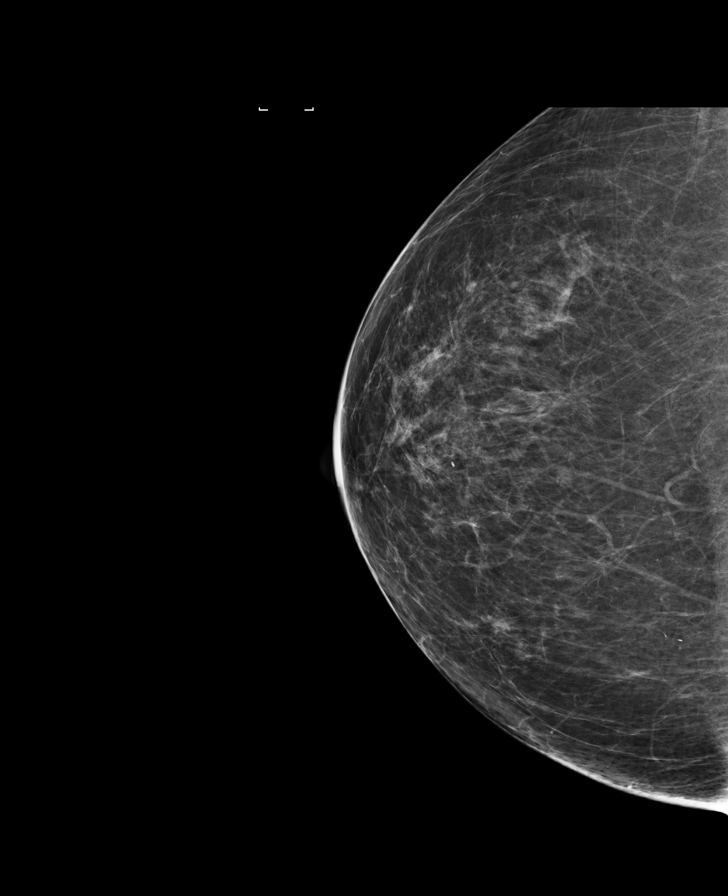

[R MLO]
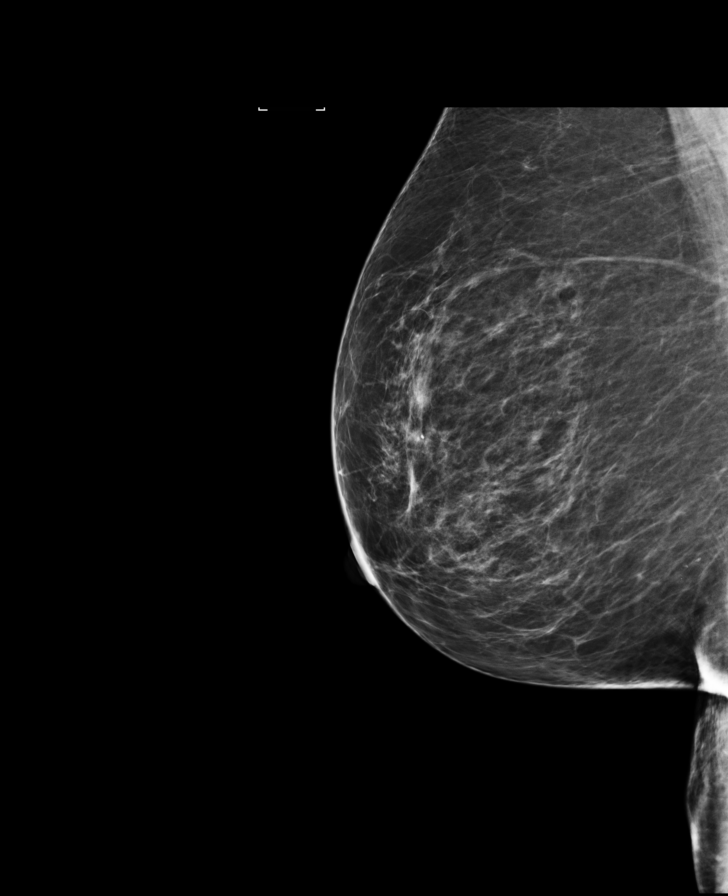

[L CC synth-2D]
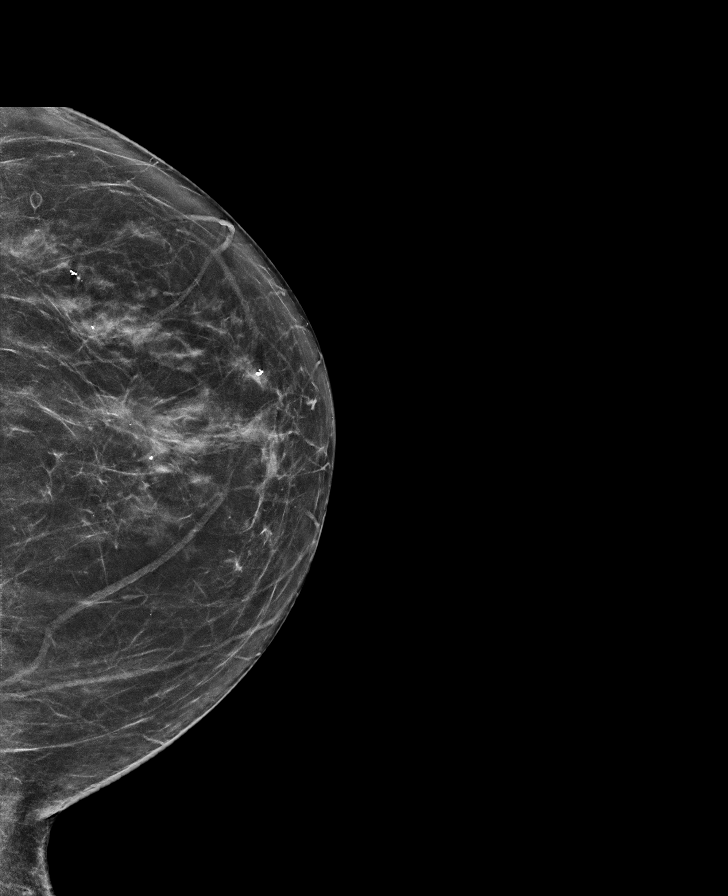

[L MLO]
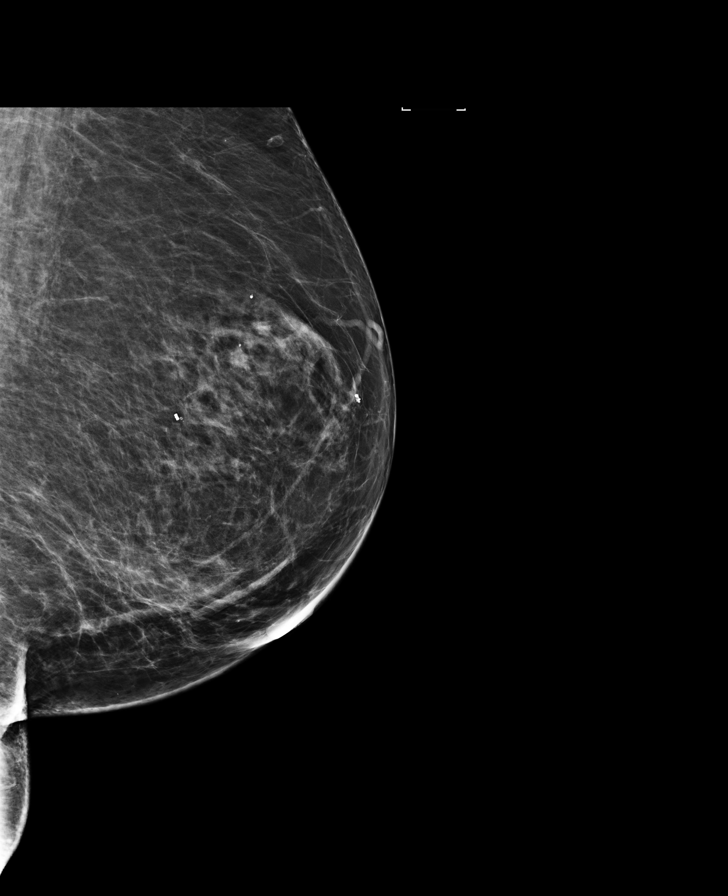

[R MLO synth-2D]
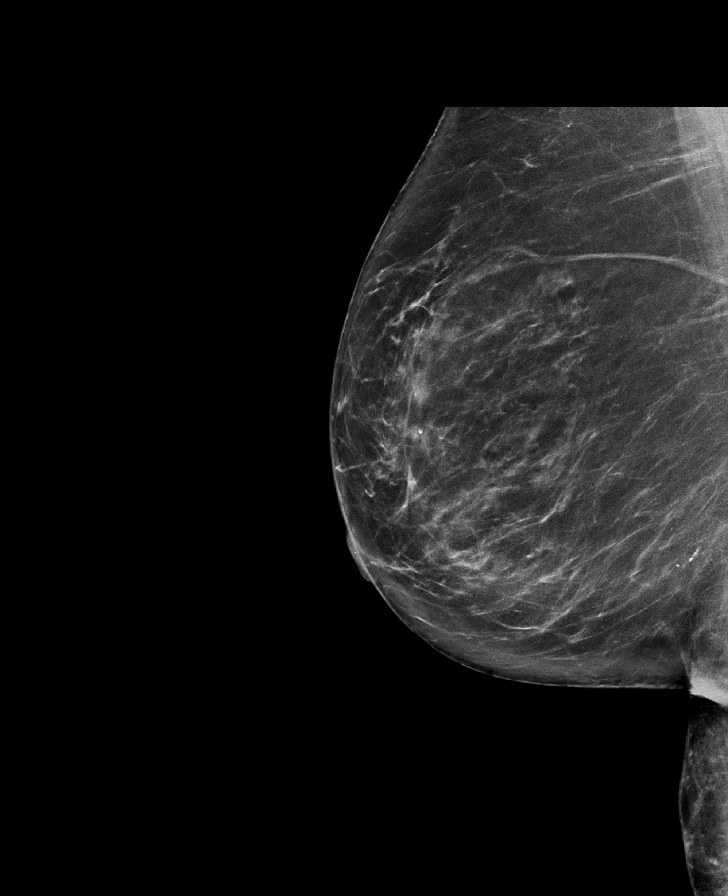

[L CC (2 of 2)]
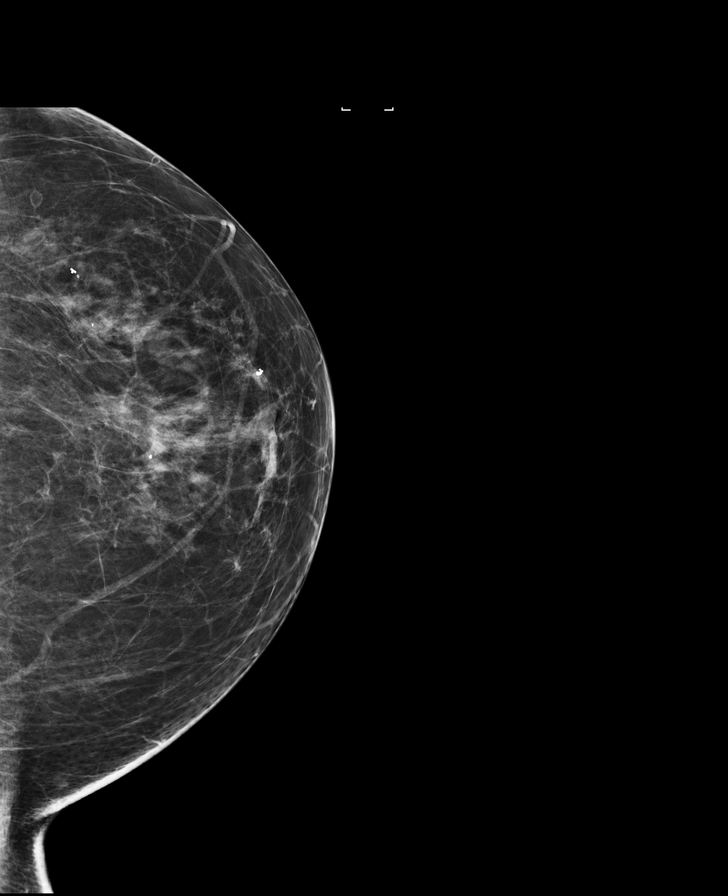

[R CC synth-2D]
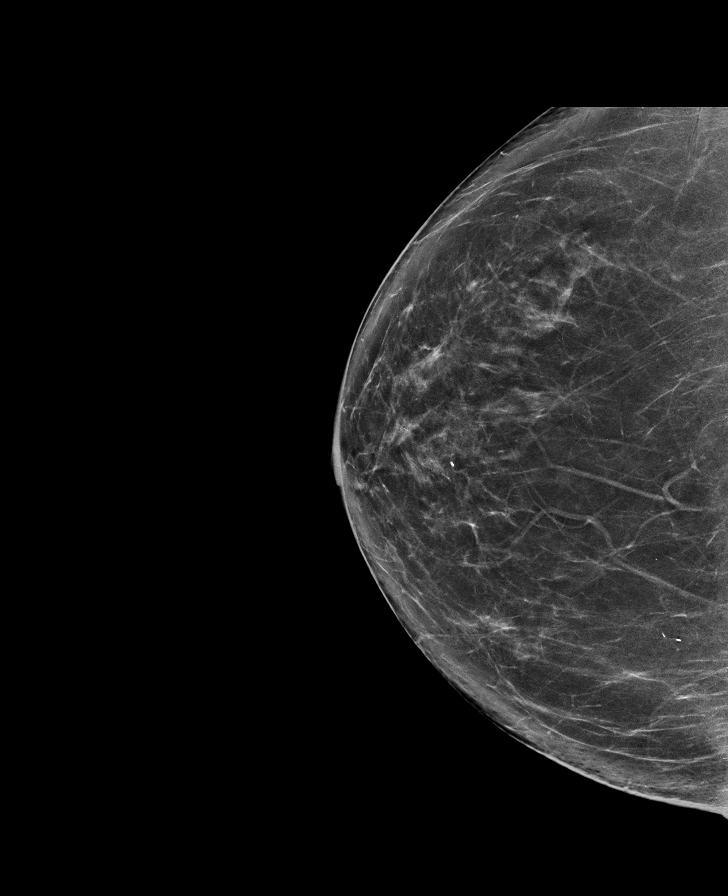

[8 of 29 positions shown; findings below may reference images not displayed]

ACR Breast Density Category b: There are scattered areas of
fibroglandular density.
FINDINGS: There are stable postsurgical changes within the left breast. There
are no new dominant masses, suspicious calcifications or secondary
signs of malignancy within either breast.

Mammographic images were processed with CAD.
IMPRESSION: No evidence of malignancy within either breast. Stable postsurgical
changes.

RECOMMENDATION:
Bilateral diagnostic mammogram in 1 year.

I have discussed the findings and recommendations with the patient.
Results were also provided in writing at the conclusion of the
visit. If applicable, a reminder letter will be sent to the patient
regarding the next appointment.

BI-RADS CATEGORY  2: Benign.

## 2016-12-08 NOTE — Progress Notes (Signed)
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  Heterozygote factor V 5 Leiden.   INTERVAL HISTORY: Patient returns to clinic today for routine 6 month follow-up. She continues to tolerate letrozole and xarelto without significant side effects. She has no neurologic complaints. She denies any recent fevers or illnesses. She denies any pain. She denies any chest pain, cough, hemoptysis, or shortness of breath. She denies any nausea, vomiting, constipation or diarrhea. She has no melena or hematochezia. She has no urinary complaints. Patient offers no specific complaints today.  REVIEW OF SYSTEMS:   Review of Systems  Constitutional: Negative for fever, malaise/fatigue and weight loss.  Respiratory: Negative.  Negative for cough and shortness of breath.   Cardiovascular: Negative.  Negative for chest pain.  Gastrointestinal: Negative.   Genitourinary: Negative.   Musculoskeletal: Negative.   Neurological: Negative.  Negative for weakness.  Endo/Heme/Allergies: Does not bruise/bleed easily.  Psychiatric/Behavioral: Negative.  The patient is not nervous/anxious.     As per HPI. Otherwise, a complete review of systems is negative.  PAST MEDICAL HISTORY: Past Medical History:  Diagnosis Date  . Breast cancer ([place]) 08/2013   ER positive adenocarcinoma of Left Breast. with rad tx  . Hypercholesterolemia   . Hypertension   . Personal history of radiation therapy   . Skin cancer    squamous cell  . Squamous cell carcinoma of skin    status post exicision    PAST SURGICAL HISTORY: Past Surgical History:  Procedure Laterality Date  . BREAST BIOPSY Left 09/04/2013   negative stereo bx    . BREAST BIOPSY Left 08/25/2013   postive [place] core bx  . BREAST LUMPECTOMY W/ NEEDLE LOCALIZATION Left 2015  . GANGLION CYST EXCISION      FAMILY HISTORY Family History  Problem Relation Age of Onset  . Cancer Father        colon  . Breast cancer Neg Hx        ADVANCED DIRECTIVES:    HEALTH MAINTENANCE: Social History  Substance Use Topics  . Smoking status: Former Research scientist (life sciences)  . Smokeless tobacco: Never Used  . Alcohol use Yes     Comment: social     Colonoscopy:  PAP:  Bone density:  Lipid panel:  Allergies  Allergen Reactions  . Metoprolol Shortness Of Breath  . Other Other (See Comments)    Sodium pentothal  Causes blood clots    Current Outpatient Prescriptions  Medication Sig Dispense Refill  . aspirin EC 81 MG tablet Take 81 mg by mouth daily.     Marland Kitchen buPROPion (WELLBUTRIN SR) 150 MG 12 hr tablet Take 150 mg by mouth daily.     . busPIRone (BUSPAR) 10 MG tablet Take 10 mg by mouth daily.     . citalopram (CELEXA) 20 MG tablet Take 20 mg by mouth daily.     Marland Kitchen diltiazem (TIAZAC) 240 MG 24 hr capsule Take 240 mg by mouth daily.     Marland Kitchen letrozole (FEMARA) 2.5 MG tablet TAKE ONE TABLET BY MOUTH ONCE A DAY 90 tablet 2  . LORazepam (ATIVAN) 1 MG tablet Take 1 mg by mouth as needed.     . pravastatin (PRAVACHOL) 40 MG tablet Take 40 mg by  mouth daily.     . rivaroxaban (XARELTO) 20 MG TABS tablet Take 20 mg by mouth daily with supper.    . triamterene-hydrochlorothiazide (MAXZIDE-25) 37.5-25 MG per tablet Take 1 tablet by mouth daily.      No current facility-administered medications for this visit.     OBJECTIVE: Vitals:   12/09/16 1442  BP: 134/81  Pulse: 71  Resp: 18  Temp: 97.9 F (36.6 C)     Body mass index is 33.8 kg/m.    ECOG FS:0 - Asymptomatic  General: Well-developed, well-nourished, no acute distress. Eyes: anicteric sclera. Breasts: Bilateral breast and axilla without lumps or masses. Lungs: Clear to auscultation bilaterally. Heart: Regular  rate and rhythm. No rubs, murmurs, or gallops. Abdomen: Soft, nontender, nondistended. No organomegaly noted, normoactive bowel sounds. Musculoskeletal: No edema, cyanosis, or clubbing. Neuro: Alert, answering all questions appropriately. Cranial nerves grossly intact. Skin: No rashes or petechiae noted. Psych: Normal affect.  LAB RESULTS:  Lab Results  Component Value Date   NA 132 (L) 11/16/2013   K 3.3 (L) 11/16/2013   CL 92 (L) 11/16/2013   CO2 25 11/16/2013   GLUCOSE 168 (H) 11/16/2013   BUN 14 11/16/2013   CREATININE 1.19 11/16/2013   CALCIUM 9.0 11/16/2013   PROT 7.6 11/16/2013   ALBUMIN 3.1 (L) 11/16/2013   AST 34 11/16/2013   ALT 42 11/16/2013   ALKPHOS 80 11/16/2013   BILITOT 1.0 11/16/2013   GFRNONAA 48 (L) 11/16/2013   GFRAA 55 (L) 11/16/2013    Lab Results  Component Value Date   WBC 7.7 02/20/2014   NEUTROABS 5.9 02/20/2014   HGB 15.0 02/20/2014   HCT 44.8 02/20/2014   MCV 84 02/20/2014   PLT 208 02/20/2014     STUDIES: No results found.  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  Heterozygote factor V 5 Leiden.  PLAN:    1. 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 She received one cycle of Taxotere and Cytoxan, but secondary to significant toxicity this was discontinued. No evidence of disease. Continue letrozole completing in December 2020. Her most recent mammogram on August 26, 2016 was reported as BI-RADS 2, repeat in June 2019. Return to clinic in 6 months for routine evaluation.  2. Heterozygote factor V 5 Leiden: Patient was diagnosed with PE and DVT at an outside hospital. She completed 6 months of treatment with Xarelto, but given her increased risk has elected to stay on anticoagulation lifelong. Patient receives her prescription  from her primary care physician.  3. Osteopenia: Bone mineral density from July 14, 2016 revealed a T score of - -2.1 which is essentially unchanged from previous. Continue calcium and vitamin D. Repeat in April 2019.   Patient expressed understanding and was in agreement with this plan. She also understands that She can call clinic at any time with any questions, concerns, or complaints.   Breast cancer   Staging form: Breast, AJCC 7th Edition     Clinical stage from 09/22/2014: Stage IA (T1b, N0, M0) - Signed by Lloyd Huger, MD on 09/22/2014   Lloyd Huger, MD   12/14/2016 3:23 PM

## 2016-12-09 ENCOUNTER — Inpatient Hospital Stay: Payer: Medicare HMO | Attending: Oncology | Admitting: Oncology

## 2016-12-09 VITALS — BP 134/81 | HR 71 | Temp 97.9°F | Resp 18 | Wt 209.4 lb

## 2016-12-09 DIAGNOSIS — Z85828 Personal history of other malignant neoplasm of skin: Secondary | ICD-10-CM | POA: Insufficient documentation

## 2016-12-09 DIAGNOSIS — Z87891 Personal history of nicotine dependence: Secondary | ICD-10-CM | POA: Diagnosis not present

## 2016-12-09 DIAGNOSIS — E7800 Pure hypercholesterolemia, unspecified: Secondary | ICD-10-CM | POA: Diagnosis not present

## 2016-12-09 DIAGNOSIS — D6851 Activated protein C resistance: Secondary | ICD-10-CM | POA: Diagnosis not present

## 2016-12-09 DIAGNOSIS — Z86711 Personal history of pulmonary embolism: Secondary | ICD-10-CM | POA: Insufficient documentation

## 2016-12-09 DIAGNOSIS — Z7982 Long term (current) use of aspirin: Secondary | ICD-10-CM | POA: Insufficient documentation

## 2016-12-09 DIAGNOSIS — M8580 Other specified disorders of bone density and structure, unspecified site: Secondary | ICD-10-CM | POA: Diagnosis not present

## 2016-12-09 DIAGNOSIS — C50512 Malignant neoplasm of lower-outer quadrant of left female breast: Secondary | ICD-10-CM | POA: Insufficient documentation

## 2016-12-09 DIAGNOSIS — Z800 Family history of malignant neoplasm of digestive organs: Secondary | ICD-10-CM | POA: Diagnosis not present

## 2016-12-09 DIAGNOSIS — Z79899 Other long term (current) drug therapy: Secondary | ICD-10-CM | POA: Insufficient documentation

## 2016-12-09 DIAGNOSIS — Z7901 Long term (current) use of anticoagulants: Secondary | ICD-10-CM

## 2016-12-09 DIAGNOSIS — Z86718 Personal history of other venous thrombosis and embolism: Secondary | ICD-10-CM | POA: Diagnosis not present

## 2016-12-09 DIAGNOSIS — Z923 Personal history of irradiation: Secondary | ICD-10-CM | POA: Insufficient documentation

## 2016-12-09 DIAGNOSIS — Z79811 Long term (current) use of aromatase inhibitors: Secondary | ICD-10-CM | POA: Diagnosis not present

## 2016-12-09 DIAGNOSIS — I10 Essential (primary) hypertension: Secondary | ICD-10-CM | POA: Diagnosis not present

## 2016-12-09 DIAGNOSIS — Z170 Estrogen receptor positive status [ER+]: Secondary | ICD-10-CM | POA: Diagnosis not present

## 2017-02-19 ENCOUNTER — Other Ambulatory Visit: Payer: Self-pay | Admitting: Oncology

## 2017-06-06 NOTE — Progress Notes (Signed)
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  Heterozygote factor V 5 Leiden.   INTERVAL HISTORY: Patient returns to clinic today for routine 6 month follow-up. She continues to tolerate letrozole and xarelto without significant side effects.  She has some left breast tenderness, but otherwise feels well.  She has no neurologic complaints. She denies any recent fevers or illnesses. She denies any pain. She denies any chest pain, cough, hemoptysis, or shortness of breath. She denies any nausea, vomiting, constipation or diarrhea. She has no melena or hematochezia. She has no urinary complaints. Patient offers no further specific complaints today.  REVIEW OF SYSTEMS:   Review of Systems  Constitutional: Negative.  Negative for fever, malaise/fatigue and weight loss.  Respiratory: Negative.  Negative for cough and shortness of breath.   Cardiovascular: Negative.  Negative for chest pain.  Gastrointestinal: Negative.  Negative for abdominal pain, blood in stool and melena.  Genitourinary: Negative.  Negative for hematuria.  Musculoskeletal: Negative.   Skin: Negative.  Negative for rash.  Neurological: Negative.  Negative for focal weakness and weakness.  Endo/Heme/Allergies: Does not bruise/bleed easily.  Psychiatric/Behavioral: Negative.  The patient is not nervous/anxious.     As per HPI. Otherwise, a complete review of systems is negative.  PAST MEDICAL HISTORY: Past Medical History:  Diagnosis Date  . Breast cancer ([place]) 08/2013   ER positive adenocarcinoma of Left Breast. with rad tx  . Hypercholesterolemia   . Hypertension   . Personal history of radiation therapy   . Skin  cancer    squamous cell  . Squamous cell carcinoma of skin    status post exicision    PAST SURGICAL HISTORY: Past Surgical History:  Procedure Laterality Date  . BREAST BIOPSY Left 09/04/2013   negative stereo bx  . BREAST BIOPSY Left 08/25/2013   postive [place] core bx  . BREAST LUMPECTOMY W/ NEEDLE LOCALIZATION Left 2015  . GANGLION CYST EXCISION      FAMILY HISTORY Family History  Problem Relation Age of Onset  . Cancer Father        colon  . Breast cancer Neg Hx        ADVANCED DIRECTIVES:    HEALTH MAINTENANCE: Social History   Tobacco Use  . Smoking status: Former Research scientist (life sciences)  . Smokeless tobacco: Never Used  Substance Use Topics  . Alcohol use: Yes    Comment: social  . Drug use: No     Colonoscopy:  PAP:  Bone density:  Lipid panel:  Allergies  Allergen Reactions  . Metoprolol Shortness Of Breath  . Other Other (See Comments)    Sodium pentothal  Causes blood clots    Current Outpatient Medications  Medication Sig Dispense Refill  . aspirin EC 81 MG tablet Take 81 mg by mouth daily.     Marland Kitchen buPROPion (WELLBUTRIN SR) 150 MG 12 hr tablet Take 150 mg by mouth daily.     . busPIRone (BUSPAR) 10 MG tablet Take 10 mg by mouth daily.     . citalopram (CELEXA) 20 MG tablet Take 20 mg by mouth daily.     Marland Kitchen diltiazem (TIAZAC) 240 MG 24 hr capsule Take 240 mg by mouth daily.     Marland Kitchen  letrozole (FEMARA) 2.5 MG tablet TAKE ONE TABLET BY MOUTH DAILY 90 tablet 1  . pravastatin (PRAVACHOL) 40 MG tablet Take 40 mg by mouth daily.     . rivaroxaban (XARELTO) 20 MG TABS tablet Take 20 mg by mouth daily with supper.    . triamterene-hydrochlorothiazide (MAXZIDE-25) 37.5-25 MG per tablet Take 1 tablet by mouth daily.     Marland Kitchen LORazepam (ATIVAN) 1 MG tablet Take 1 mg by mouth as needed.      No current facility-administered medications for this visit.     OBJECTIVE: Vitals:   06/09/17 1132  BP: (!) 147/99  Pulse: 92  Resp: 18  Temp: (!) 95.7 F (35.4 C)     Body mass index  is 34.31 kg/m.    ECOG FS:0 - Asymptomatic  General: Well-developed, well-nourished, no acute distress. Eyes: anicteric sclera. Breasts: Bilateral breast and axilla without lumps or masses. Lungs: Clear to auscultation bilaterally. Heart: Regular rate and rhythm. No rubs, murmurs, or gallops. Abdomen: Soft, nontender, nondistended. No organomegaly noted, normoactive bowel sounds. Musculoskeletal: No edema, cyanosis, or clubbing. Neuro: Alert, answering all questions appropriately. Cranial nerves grossly intact. Skin: No rashes or petechiae noted. Psych: Normal affect.  LAB RESULTS:  Lab Results  Component Value Date   NA 132 (L) 11/16/2013   K 3.3 (L) 11/16/2013   CL 92 (L) 11/16/2013   CO2 25 11/16/2013   GLUCOSE 168 (H) 11/16/2013   BUN 14 11/16/2013   CREATININE 1.19 11/16/2013   CALCIUM 9.0 11/16/2013   PROT 7.6 11/16/2013   ALBUMIN 3.1 (L) 11/16/2013   AST 34 11/16/2013   ALT 42 11/16/2013   ALKPHOS 80 11/16/2013   BILITOT 1.0 11/16/2013   GFRNONAA 48 (L) 11/16/2013   GFRAA 55 (L) 11/16/2013    Lab Results  Component Value Date   WBC 7.7 02/20/2014   NEUTROABS 5.9 02/20/2014   HGB 15.0 02/20/2014   HCT 44.8 02/20/2014   MCV 84 02/20/2014   PLT 208 02/20/2014     STUDIES: No results found.  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  Heterozygote factor V 5 Leiden.  PLAN:    1. 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 She received one cycle of Taxotere and Cytoxan, but secondary to significant toxicity this was discontinued. No evidence of disease. Continue letrozole completing in December 2020. Her most recent mammogram on August 26, 2016 was reported as BI-RADS 2, repeat in June 2019. Return to clinic in 6 months for routine evaluation.  2.  Heterozygote factor V 5 Leiden: Patient was diagnosed with PE and DVT at an outside hospital. She completed 6 months of treatment with Xarelto, but given her increased risk has elected to stay on anticoagulation lifelong. Patient receives her prescription from her primary care physician.  3. Osteopenia: Bone mineral density from July 14, 2016 revealed a T score of -2.1 which is essentially unchanged from previous. Continue calcium and vitamin D. Repeat in June 2019 along with patient's mammogram.  Patient expressed understanding and was in agreement with this plan. She also understands that She can call clinic at any time with any questions, concerns, or complaints.   Breast cancer   Staging form: Breast, AJCC 7th Edition     Clinical stage from 09/22/2014: Stage IA (T1b, N0, M0) -  Signed by Lloyd Huger, MD on 09/22/2014   Lloyd Huger, MD   06/10/2017 2:27 PM

## 2017-06-09 ENCOUNTER — Inpatient Hospital Stay: Payer: Medicare HMO | Attending: Oncology | Admitting: Oncology

## 2017-06-09 ENCOUNTER — Other Ambulatory Visit: Payer: Self-pay

## 2017-06-09 VITALS — BP 147/99 | HR 92 | Temp 95.7°F | Resp 18 | Wt 212.6 lb

## 2017-06-09 DIAGNOSIS — Z170 Estrogen receptor positive status [ER+]: Secondary | ICD-10-CM | POA: Diagnosis not present

## 2017-06-09 DIAGNOSIS — C50512 Malignant neoplasm of lower-outer quadrant of left female breast: Secondary | ICD-10-CM

## 2017-06-09 DIAGNOSIS — Z79899 Other long term (current) drug therapy: Secondary | ICD-10-CM | POA: Diagnosis not present

## 2017-06-09 DIAGNOSIS — D6851 Activated protein C resistance: Secondary | ICD-10-CM | POA: Diagnosis not present

## 2017-06-09 DIAGNOSIS — E7800 Pure hypercholesterolemia, unspecified: Secondary | ICD-10-CM | POA: Diagnosis not present

## 2017-06-09 DIAGNOSIS — Z7901 Long term (current) use of anticoagulants: Secondary | ICD-10-CM | POA: Insufficient documentation

## 2017-06-09 DIAGNOSIS — I10 Essential (primary) hypertension: Secondary | ICD-10-CM | POA: Diagnosis not present

## 2017-06-09 DIAGNOSIS — Z7982 Long term (current) use of aspirin: Secondary | ICD-10-CM | POA: Insufficient documentation

## 2017-06-09 DIAGNOSIS — Z79811 Long term (current) use of aromatase inhibitors: Secondary | ICD-10-CM | POA: Insufficient documentation

## 2017-06-09 DIAGNOSIS — Z85828 Personal history of other malignant neoplasm of skin: Secondary | ICD-10-CM | POA: Diagnosis not present

## 2017-06-09 DIAGNOSIS — Z800 Family history of malignant neoplasm of digestive organs: Secondary | ICD-10-CM | POA: Diagnosis not present

## 2017-06-09 DIAGNOSIS — Z923 Personal history of irradiation: Secondary | ICD-10-CM | POA: Diagnosis not present

## 2017-06-09 DIAGNOSIS — Z87891 Personal history of nicotine dependence: Secondary | ICD-10-CM | POA: Insufficient documentation

## 2017-06-09 DIAGNOSIS — M8580 Other specified disorders of bone density and structure, unspecified site: Secondary | ICD-10-CM | POA: Diagnosis not present

## 2017-06-09 NOTE — Progress Notes (Signed)
Here for follow up feeling tired. Stated left breast at 8 oclock area feels tender to touch ( asked for Dr Grayland Ormond to evaluate) hurts w "banging onto something "per pt

## 2017-06-10 ENCOUNTER — Other Ambulatory Visit: Payer: Self-pay | Admitting: Oncology

## 2017-06-10 DIAGNOSIS — Z170 Estrogen receptor positive status [ER+]: Principal | ICD-10-CM

## 2017-06-10 DIAGNOSIS — C50919 Malignant neoplasm of unspecified site of unspecified female breast: Secondary | ICD-10-CM

## 2017-07-31 ENCOUNTER — Other Ambulatory Visit: Payer: Self-pay | Admitting: Oncology

## 2017-08-21 HISTORY — PX: BREAST LUMPECTOMY: SHX2

## 2017-08-31 ENCOUNTER — Ambulatory Visit: Payer: Medicare HMO

## 2017-08-31 ENCOUNTER — Ambulatory Visit
Admission: RE | Admit: 2017-08-31 | Discharge: 2017-08-31 | Disposition: A | Discharge status: Home / Self Care | Payer: Medicare HMO | Source: Ambulatory Visit | Attending: Oncology | Admitting: Oncology

## 2017-08-31 DIAGNOSIS — M85852 Other specified disorders of bone density and structure, left thigh: Secondary | ICD-10-CM | POA: Insufficient documentation

## 2017-08-31 DIAGNOSIS — C50512 Malignant neoplasm of lower-outer quadrant of left female breast: Secondary | ICD-10-CM

## 2017-08-31 DIAGNOSIS — C50919 Malignant neoplasm of unspecified site of unspecified female breast: Secondary | ICD-10-CM

## 2017-08-31 DIAGNOSIS — Z170 Estrogen receptor positive status [ER+]: Principal | ICD-10-CM

## 2017-08-31 HISTORY — DX: Personal history of antineoplastic chemotherapy: Z92.21

## 2017-08-31 IMAGING — MG MM DIGITAL DIAGNOSTIC BILAT W/ TOMO W/ CAD
8 of 12 series · 8 of 32 positions shown · non-contrast
Comparison: Previous exam(s).

CLINICAL DATA: 71-year-old female status post left lumpectomy in
[LJ] with radiation.

EXAM:
DIGITAL DIAGNOSTIC BILATERAL MAMMOGRAM WITH CAD AND TOMO

[L CC (1 of 2)]
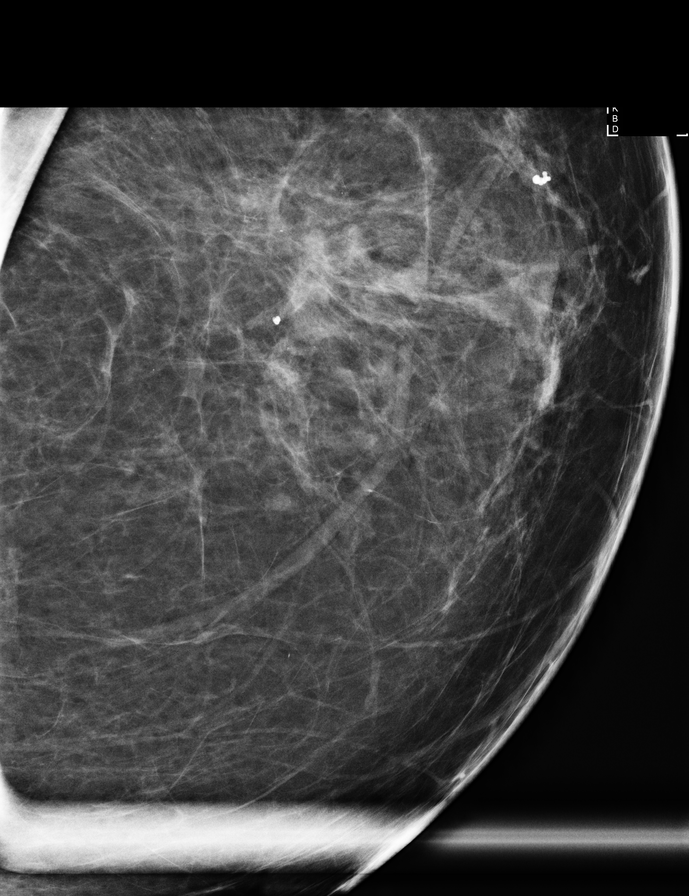

[L CC (2 of 2)]
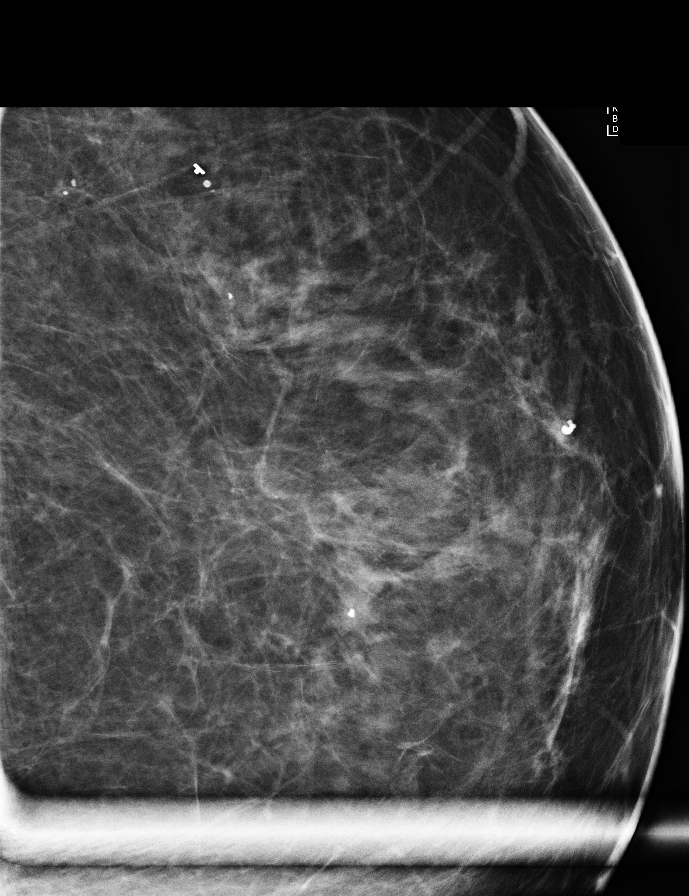

[R CC synth-2D]
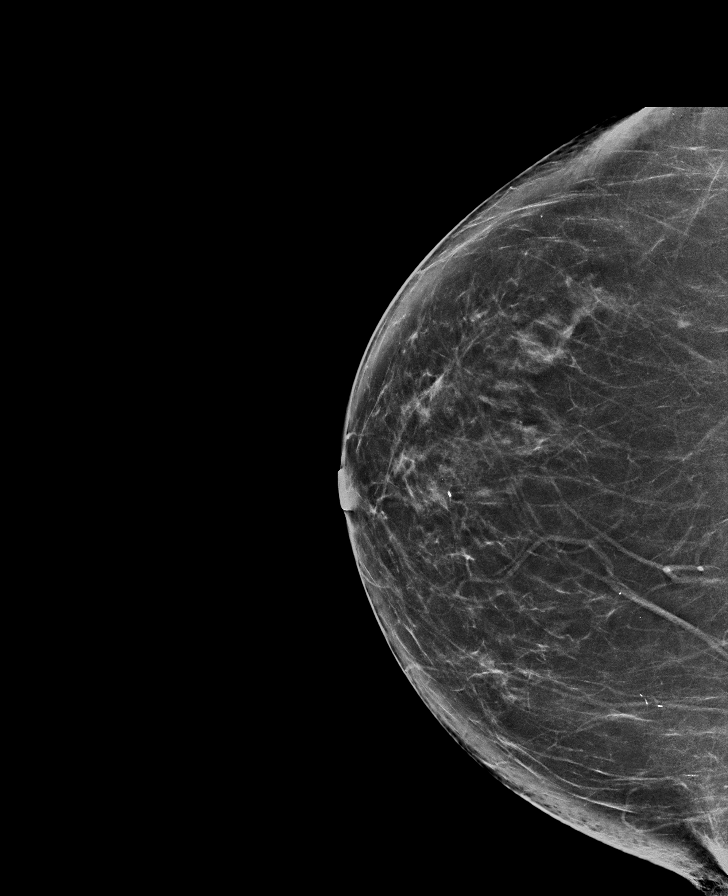

[R MLO synth-2D]
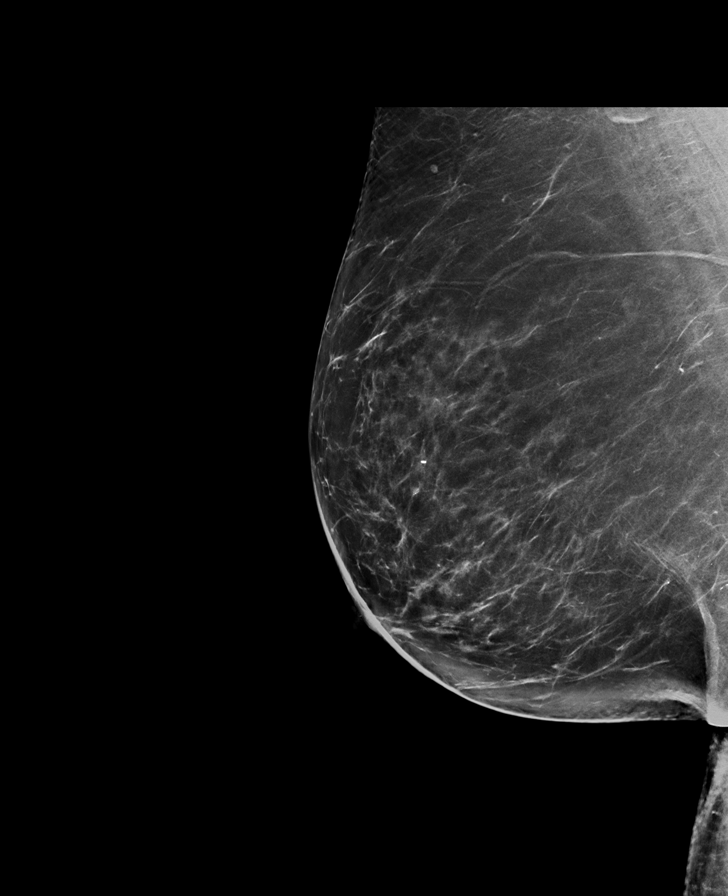

[L CC synth-2D]
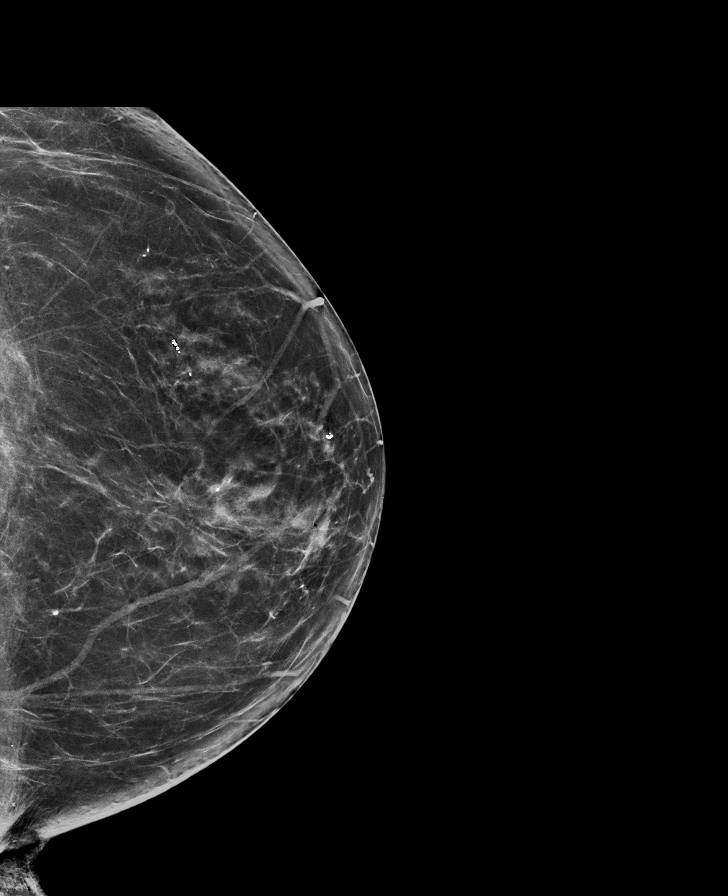

[L MLO synth-2D (1 of 2)]
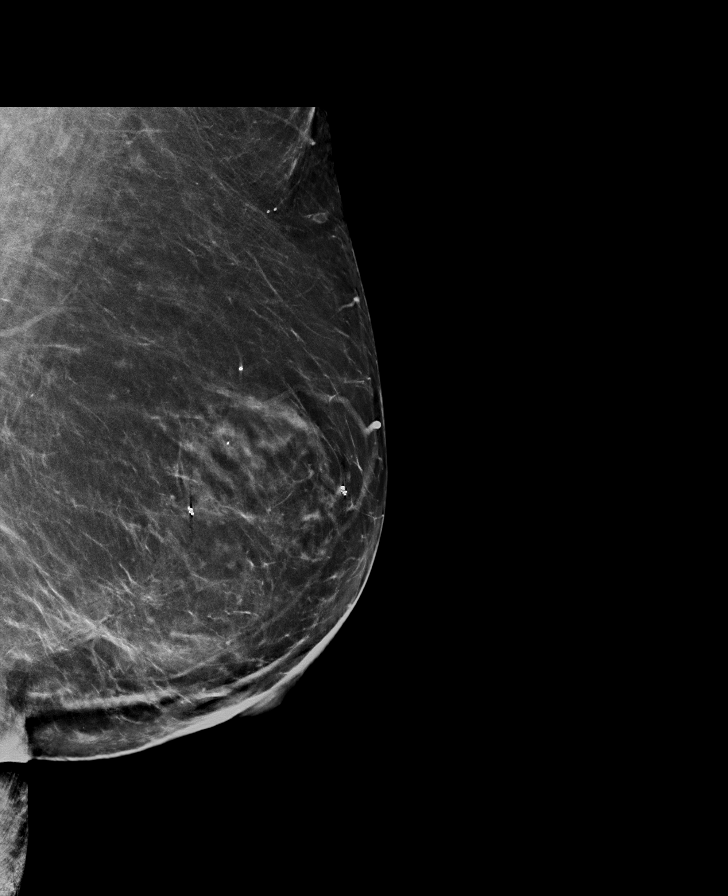

[L MLO synth-2D (2 of 2)]
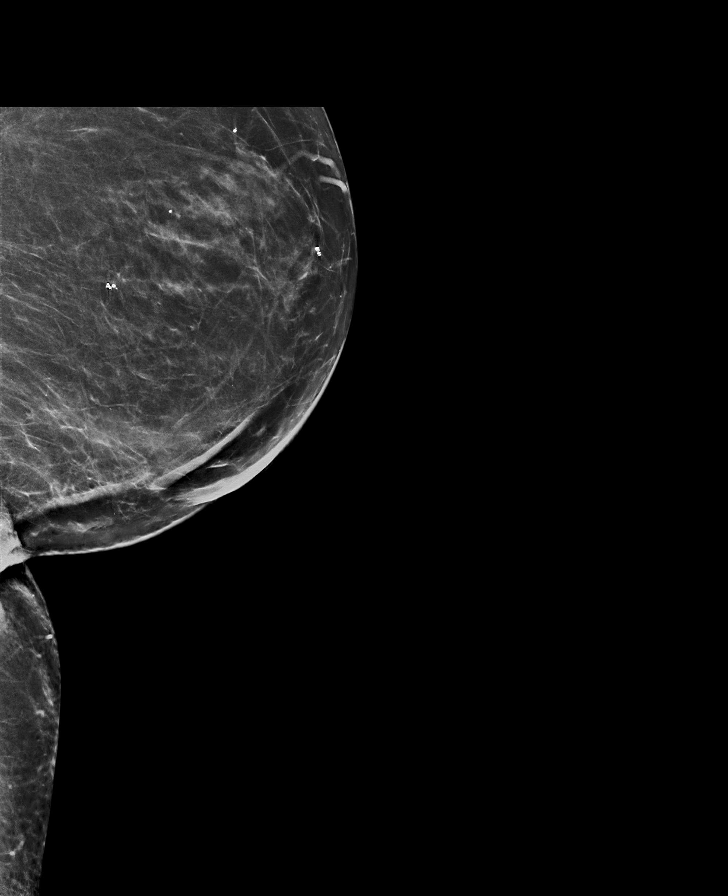

[L MLO tomo · tomo slice 39/77.0]
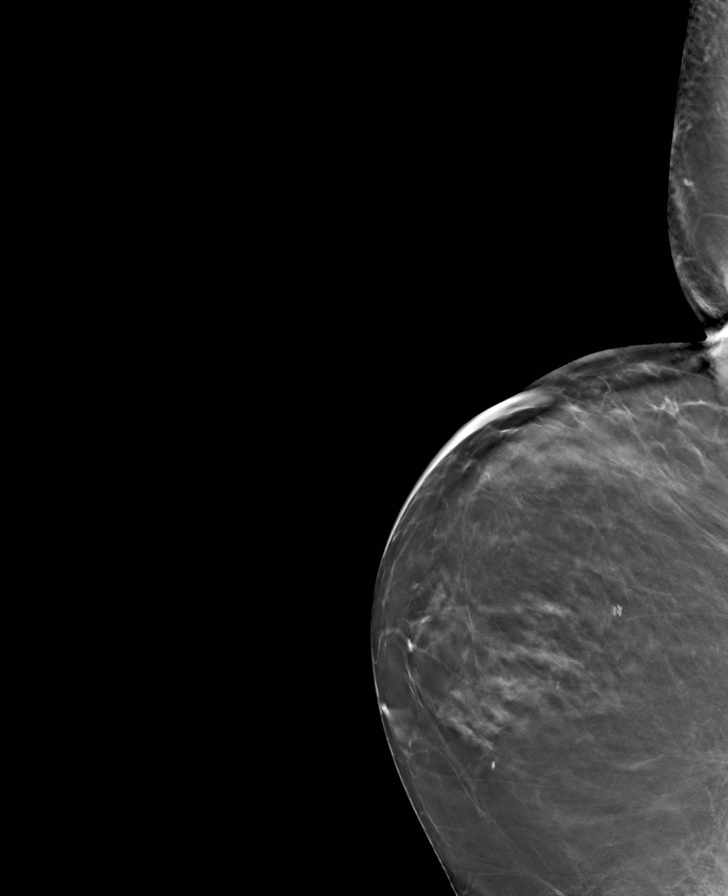

[8 of 32 positions shown; findings below may reference images not displayed]

ACR Breast Density Category b: There are scattered areas of
fibroglandular density.
FINDINGS: Stable postsurgical changes are demonstrated in the left breast. No
suspicious mammographic findings are identified in either breast.

Mammographic images were processed with CAD.
IMPRESSION: 1. No mammographic evidence of malignancy in either breast.
2. Stable left breast posttreatment changes.

RECOMMENDATION:
Diagnostic mammogram is suggested in 1 year. (Code:[LJ])

I have discussed the findings and recommendations with the patient.
Results were also provided in writing at the conclusion of the
visit. If applicable, a reminder letter will be sent to the patient
regarding the next appointment.

BI-RADS CATEGORY  2: Benign.

## 2017-10-30 ENCOUNTER — Other Ambulatory Visit: Payer: Self-pay | Admitting: Oncology

## 2017-12-08 ENCOUNTER — Ambulatory Visit: Payer: Medicare HMO | Admitting: Oncology

## 2017-12-22 ENCOUNTER — Ambulatory Visit: Payer: Medicare HMO | Admitting: Oncology

## 2017-12-26 NOTE — Progress Notes (Signed)
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  Heterozygote factor V 5 Leiden.   INTERVAL HISTORY: Patient returns to clinic today for routine six-month follow-up.  She continues to tolerate letrozole well without significant side effects.  She currently feels well and is asymptomatic. She has no neurologic complaints. She denies any recent fevers or illnesses. She denies any pain. She denies any chest pain, cough, hemoptysis, or shortness of breath. She denies any nausea, vomiting, constipation or diarrhea. She has no melena or hematochezia. She has no urinary complaints.  Patient feels at her baseline offers no specific complaints today.  REVIEW OF SYSTEMS:   Review of Systems  Constitutional: Negative.  Negative for fever, malaise/fatigue and weight loss.  Respiratory: Negative.  Negative for cough and shortness of breath.   Cardiovascular: Negative.  Negative for chest pain and leg swelling.  Gastrointestinal: Negative.  Negative for abdominal pain, blood in stool and melena.  Genitourinary: Negative.  Negative for hematuria.  Musculoskeletal: Negative.  Negative for back pain.  Skin: Negative.  Negative for rash.  Neurological: Negative.  Negative for focal weakness, weakness and headaches.  Endo/Heme/Allergies: Does not bruise/bleed easily.  Psychiatric/Behavioral: Negative.  The patient is not nervous/anxious.     As per HPI. Otherwise, a complete review of systems is negative.  PAST MEDICAL HISTORY: Past Medical History:  Diagnosis Date  . Breast cancer ([place]) 08/2013   ER positive adenocarcinoma of Left Breast. with rad tx  . Hypercholesterolemia   . Hypertension   .  Personal history of chemotherapy    1 round  . Personal history of radiation therapy   . Skin cancer    squamous cell  . Squamous cell carcinoma of skin    status post exicision    PAST SURGICAL HISTORY: Past Surgical History:  Procedure Laterality Date  . BREAST BIOPSY Left 09/04/2013   negative stereo bx  . BREAST BIOPSY Left 08/25/2013   postive [place] core bx  . BREAST LUMPECTOMY Left 08/2017  . BREAST LUMPECTOMY W/ NEEDLE LOCALIZATION Left 2015  . GANGLION CYST EXCISION      FAMILY HISTORY Family History  Problem Relation Age of Onset  . Cancer Father        colon  . Breast cancer Neg Hx        ADVANCED DIRECTIVES:    HEALTH MAINTENANCE: Social History   Tobacco Use  . Smoking status: Former Research scientist (life sciences)  . Smokeless tobacco: Never Used  Substance Use Topics  . Alcohol use: Yes    Comment: social  . Drug use: No     Colonoscopy:  PAP:  Bone density:  Lipid panel:  Allergies  Allergen Reactions  . Metoprolol Shortness Of Breath  . Other Other (See Comments)    Sodium pentothal  Causes blood clots    Current Outpatient Medications  Medication Sig Dispense Refill  . aspirin EC 81 MG tablet Take 81 mg by mouth daily.     Marland Kitchen buPROPion (WELLBUTRIN SR) 150 MG 12 hr tablet Take 150 mg by mouth daily.     . busPIRone (BUSPAR) 10 MG tablet Take 10 mg by mouth daily.     . citalopram (CELEXA) 20 MG tablet Take 20 mg by mouth  daily.     . diltiazem (TIAZAC) 240 MG 24 hr capsule Take 240 mg by mouth daily.     Marland Kitchen letrozole (FEMARA) 2.5 MG tablet TAKE ONE TABLET BY MOUTH DAILY 90 tablet 0  . pravastatin (PRAVACHOL) 40 MG tablet Take 40 mg by mouth daily.     Marland Kitchen triamterene-hydrochlorothiazide (MAXZIDE-25) 37.5-25 MG per tablet Take 1 tablet by mouth daily.     . vitamin B-12 (CYANOCOBALAMIN) 1000 MCG tablet Take 1,000 mcg by mouth daily.     Marland Kitchen warfarin (COUMADIN) 2 MG tablet Take 2 mg by mouth one time only at 6 PM.     . Cholecalciferol (VITAMIN D3) 2000 units capsule  Take by mouth.    Marland Kitchen HYDROcodone-acetaminophen (NORCO/VICODIN) 5-325 MG tablet Take by mouth.    Marland Kitchen LORazepam (ATIVAN) 1 MG tablet Take 1 mg by mouth as needed.     . rivaroxaban (XARELTO) 20 MG TABS tablet Take 20 mg by mouth daily with supper.     No current facility-administered medications for this visit.     OBJECTIVE: Vitals:   12/28/17 1147  BP: (!) 146/99  Pulse: (!) 104  Resp: 18  Temp: (!) 95.8 F (35.4 C)     Body mass index is 34.12 kg/m.    ECOG FS:0 - Asymptomatic  General: Well-developed, well-nourished, no acute distress. Eyes: Pink conjunctiva, anicteric sclera. HEENT: Normocephalic, moist mucous membranes. Breast: Patient requested exam be deferred today. Lungs: Clear to auscultation bilaterally. Heart: Regular rate and rhythm. No rubs, murmurs, or gallops. Abdomen: Soft, nontender, nondistended. No organomegaly noted, normoactive bowel sounds. Musculoskeletal: No edema, cyanosis, or clubbing. Neuro: Alert, answering all questions appropriately. Cranial nerves grossly intact. Skin: No rashes or petechiae noted. Psych: Normal affect.  LAB RESULTS:  Lab Results  Component Value Date   NA 132 (L) 11/16/2013   K 3.3 (L) 11/16/2013   CL 92 (L) 11/16/2013   CO2 25 11/16/2013   GLUCOSE 168 (H) 11/16/2013   BUN 14 11/16/2013   CREATININE 1.19 11/16/2013   CALCIUM 9.0 11/16/2013   PROT 7.6 11/16/2013   ALBUMIN 3.1 (L) 11/16/2013   AST 34 11/16/2013   ALT 42 11/16/2013   ALKPHOS 80 11/16/2013   BILITOT 1.0 11/16/2013   GFRNONAA 48 (L) 11/16/2013   GFRAA 55 (L) 11/16/2013    Lab Results  Component Value Date   WBC 7.7 02/20/2014   NEUTROABS 5.9 02/20/2014   HGB 15.0 02/20/2014   HCT 44.8 02/20/2014   MCV 84 02/20/2014   PLT 208 02/20/2014     STUDIES: No results found.  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  Heterozygote factor V 5 Leiden.  PLAN:    1.  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 She received one cycle of Taxotere and Cytoxan, but secondary to significant toxicity this was discontinued.  Continue letrozole for a minimum of 5 years through December 2020.  Given her high risk, will consider additional 2 to 5 years of treatment.  Her most recent mammogram on August 31, 2017 was reported as BI-RADS 2.  Repeat in June 2020.  Return to clinic in 6 months for routine evaluation.  2. Heterozygote factor V 5 Leiden: Patient was diagnosed with PE and DVT at an outside hospital. She completed 6 months of treatment  with Xarelto, but given her increased risk she elected to stay on anticoagulation lifelong.  Because of financial concerns, patient is on Xarelto through approximately October of each year and then she switches to Coumadin until she is "out of the donut hole".  Continue treatment per primary care physician.    3. Osteopenia: Bone mineral density on August 31, 2017 reported T score of -2.1 which is unchanged from April 2018.  Continue calcium and vitamin D supplementation.  Repeat in 1 year.    Patient expressed understanding and was in agreement with this plan. She also understands that She can call clinic at any time with any questions, concerns, or complaints.   Breast cancer   Staging form: Breast, AJCC 7th Edition     Clinical stage from 09/22/2014: Stage IA (T1b, N0, M0) - Signed by Lloyd Huger, MD on 09/22/2014   Lloyd Huger, MD   12/29/2017 4:40 PM

## 2017-12-28 ENCOUNTER — Other Ambulatory Visit: Payer: Self-pay

## 2017-12-28 ENCOUNTER — Inpatient Hospital Stay: Payer: Medicare HMO | Attending: Oncology | Admitting: Oncology

## 2017-12-28 VITALS — BP 146/99 | HR 104 | Temp 95.8°F | Resp 18 | Wt 211.4 lb

## 2017-12-28 DIAGNOSIS — D6851 Activated protein C resistance: Secondary | ICD-10-CM

## 2017-12-28 DIAGNOSIS — Z85828 Personal history of other malignant neoplasm of skin: Secondary | ICD-10-CM

## 2017-12-28 DIAGNOSIS — Z79811 Long term (current) use of aromatase inhibitors: Secondary | ICD-10-CM

## 2017-12-28 DIAGNOSIS — M8580 Other specified disorders of bone density and structure, unspecified site: Secondary | ICD-10-CM | POA: Diagnosis not present

## 2017-12-28 DIAGNOSIS — C50512 Malignant neoplasm of lower-outer quadrant of left female breast: Secondary | ICD-10-CM

## 2017-12-28 DIAGNOSIS — Z7901 Long term (current) use of anticoagulants: Secondary | ICD-10-CM

## 2017-12-28 DIAGNOSIS — Z87891 Personal history of nicotine dependence: Secondary | ICD-10-CM

## 2017-12-28 DIAGNOSIS — Z79899 Other long term (current) drug therapy: Secondary | ICD-10-CM

## 2017-12-28 DIAGNOSIS — Z7982 Long term (current) use of aspirin: Secondary | ICD-10-CM

## 2017-12-28 DIAGNOSIS — Z9221 Personal history of antineoplastic chemotherapy: Secondary | ICD-10-CM | POA: Diagnosis not present

## 2017-12-28 DIAGNOSIS — Z923 Personal history of irradiation: Secondary | ICD-10-CM | POA: Diagnosis not present

## 2017-12-28 DIAGNOSIS — Z800 Family history of malignant neoplasm of digestive organs: Secondary | ICD-10-CM

## 2017-12-28 DIAGNOSIS — Z170 Estrogen receptor positive status [ER+]: Secondary | ICD-10-CM | POA: Diagnosis not present

## 2017-12-28 DIAGNOSIS — I10 Essential (primary) hypertension: Secondary | ICD-10-CM | POA: Diagnosis not present

## 2017-12-28 NOTE — Progress Notes (Signed)
Here for follow up. Per pt overall feels " fine " no c/o. SEE med sheet -taking coumadin until Jan -then will go back to Xarelto -hit donut hole per pt

## 2018-01-28 ENCOUNTER — Other Ambulatory Visit: Payer: Self-pay | Admitting: Oncology

## 2018-06-21 DIAGNOSIS — E118 Type 2 diabetes mellitus with unspecified complications: Secondary | ICD-10-CM | POA: Insufficient documentation

## 2018-06-29 ENCOUNTER — Inpatient Hospital Stay: Payer: Medicare HMO | Admitting: Oncology

## 2018-07-27 ENCOUNTER — Other Ambulatory Visit: Payer: Self-pay | Admitting: Oncology

## 2018-08-04 ENCOUNTER — Emergency Department
Admission: EM | Admit: 2018-08-04 | Discharge: 2018-08-04 | Disposition: A | Discharge status: Home / Self Care | Payer: Medicare HMO | Source: Home / Self Care | Attending: Emergency Medicine | Admitting: Emergency Medicine

## 2018-08-04 ENCOUNTER — Emergency Department: Payer: Medicare HMO

## 2018-08-04 ENCOUNTER — Other Ambulatory Visit: Payer: Self-pay

## 2018-08-04 ENCOUNTER — Encounter: Payer: Self-pay | Admitting: Emergency Medicine

## 2018-08-04 DIAGNOSIS — Z853 Personal history of malignant neoplasm of breast: Secondary | ICD-10-CM | POA: Insufficient documentation

## 2018-08-04 DIAGNOSIS — I10 Essential (primary) hypertension: Secondary | ICD-10-CM | POA: Insufficient documentation

## 2018-08-04 DIAGNOSIS — Z79899 Other long term (current) drug therapy: Secondary | ICD-10-CM | POA: Insufficient documentation

## 2018-08-04 DIAGNOSIS — Z7982 Long term (current) use of aspirin: Secondary | ICD-10-CM | POA: Insufficient documentation

## 2018-08-04 DIAGNOSIS — N390 Urinary tract infection, site not specified: Secondary | ICD-10-CM

## 2018-08-04 DIAGNOSIS — R1013 Epigastric pain: Secondary | ICD-10-CM | POA: Insufficient documentation

## 2018-08-04 DIAGNOSIS — R1011 Right upper quadrant pain: Secondary | ICD-10-CM

## 2018-08-04 DIAGNOSIS — Z85828 Personal history of other malignant neoplasm of skin: Secondary | ICD-10-CM | POA: Insufficient documentation

## 2018-08-04 DIAGNOSIS — K2971 Gastritis, unspecified, with bleeding: Secondary | ICD-10-CM | POA: Diagnosis not present

## 2018-08-04 DIAGNOSIS — Z87891 Personal history of nicotine dependence: Secondary | ICD-10-CM | POA: Insufficient documentation

## 2018-08-04 DIAGNOSIS — Z7901 Long term (current) use of anticoagulants: Secondary | ICD-10-CM | POA: Insufficient documentation

## 2018-08-04 DIAGNOSIS — J189 Pneumonia, unspecified organism: Secondary | ICD-10-CM

## 2018-08-04 LAB — CBC WITH DIFFERENTIAL/PLATELET
Abs Immature Granulocytes: 0.06 10*3/uL (ref 0.00–0.07)
Basophils Absolute: 0.1 10*3/uL (ref 0.0–0.1)
Basophils Relative: 1 %
Eosinophils Absolute: 0.2 10*3/uL (ref 0.0–0.5)
Eosinophils Relative: 3 %
HCT: 44 % (ref 36.0–46.0)
Hemoglobin: 15 g/dL (ref 12.0–15.0)
Immature Granulocytes: 1 %
Lymphocytes Relative: 12 %
Lymphs Abs: 1.2 10*3/uL (ref 0.7–4.0)
MCH: 28.8 pg (ref 26.0–34.0)
MCHC: 34.1 g/dL (ref 30.0–36.0)
MCV: 84.6 fL (ref 80.0–100.0)
Monocytes Absolute: 0.5 10*3/uL (ref 0.1–1.0)
Monocytes Relative: 5 %
Neutro Abs: 7.7 10*3/uL (ref 1.7–7.7)
Neutrophils Relative %: 78 %
Platelets: 223 10*3/uL (ref 150–400)
RBC: 5.2 MIL/uL — ABNORMAL HIGH (ref 3.87–5.11)
RDW: 12.9 % (ref 11.5–15.5)
WBC: 9.7 10*3/uL (ref 4.0–10.5)
nRBC: 0 % (ref 0.0–0.2)

## 2018-08-04 LAB — URINALYSIS, COMPLETE (UACMP) WITH MICROSCOPIC
Bilirubin Urine: NEGATIVE
Glucose, UA: 50 mg/dL — AB
Ketones, ur: NEGATIVE mg/dL
Nitrite: NEGATIVE
Protein, ur: NEGATIVE mg/dL
Specific Gravity, Urine: 1.014 (ref 1.005–1.030)
pH: 6 (ref 5.0–8.0)

## 2018-08-04 LAB — CBC
HCT: 43.9 % (ref 36.0–46.0)
Hemoglobin: 15 g/dL (ref 12.0–15.0)
MCH: 28.8 pg (ref 26.0–34.0)
MCHC: 34.2 g/dL (ref 30.0–36.0)
MCV: 84.3 fL (ref 80.0–100.0)
Platelets: 214 10*3/uL (ref 150–400)
RBC: 5.21 MIL/uL — ABNORMAL HIGH (ref 3.87–5.11)
RDW: 12.9 % (ref 11.5–15.5)
WBC: 9.7 10*3/uL (ref 4.0–10.5)
nRBC: 0 % (ref 0.0–0.2)

## 2018-08-04 LAB — COMPREHENSIVE METABOLIC PANEL
ALT: 32 U/L (ref 0–44)
AST: 34 U/L (ref 15–41)
Albumin: 4 g/dL (ref 3.5–5.0)
Alkaline Phosphatase: 82 U/L (ref 38–126)
Anion gap: 11 (ref 5–15)
BUN: 17 mg/dL (ref 8–23)
CO2: 24 mmol/L (ref 22–32)
Calcium: 9.8 mg/dL (ref 8.9–10.3)
Chloride: 100 mmol/L (ref 98–111)
Creatinine, Ser: 0.82 mg/dL (ref 0.44–1.00)
GFR calc Af Amer: >60 mL/min (ref >60)
GFR calc non Af Amer: >60 mL/min (ref >60)
Glucose, Bld: 204 mg/dL — ABNORMAL HIGH (ref 70–99)
Potassium: 3.3 mmol/L — ABNORMAL LOW (ref 3.5–5.1)
Sodium: 135 mmol/L (ref 135–145)
Total Bilirubin: 0.7 mg/dL (ref 0.3–1.2)
Total Protein: 7.9 g/dL (ref 6.5–8.1)

## 2018-08-04 LAB — LIPASE, BLOOD: Lipase: 40 U/L (ref 11–51)

## 2018-08-04 IMAGING — CT CT ABDOMEN AND PELVIS WITH CONTRAST
2 of 5 series · 15 of 46 positions shown, 17 images · IV contrast (APPLIED)
Comparison: None.

CLINICAL DATA: Abdominal pain, radiating toward back

EXAM:
CT ABDOMEN AND PELVIS WITH CONTRAST
TECHNIQUE: Multidetector CT imaging of the abdomen and pelvis was performed
using the standard protocol following bolus administration of
intravenous contrast. Oral contrast was also administered.
CONTRAST:  100mL OMNIPAQUE IOHEXOL 300 MG/ML  SOLN

[Series 2: axial st · axial · 0.81mm/px · z∈[-507,-62]mm · 12 of 101 slices shown, 14 images]
[im 6/101  soft-tissue]
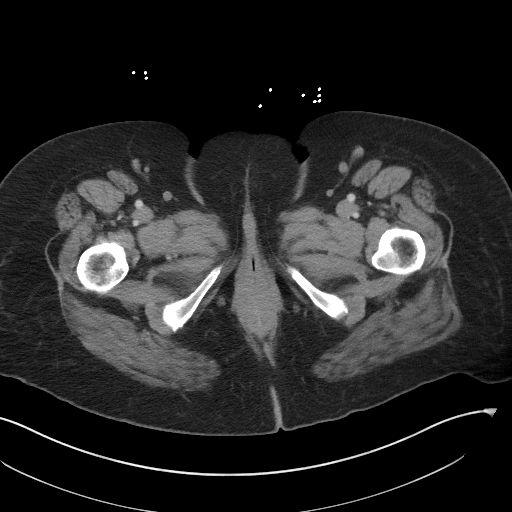
[im 6/101  bone]
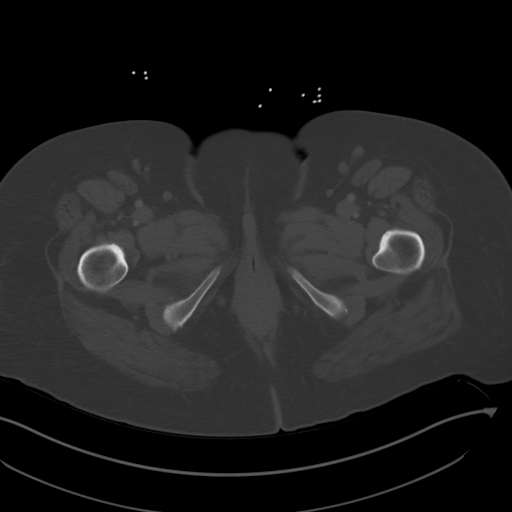
[im 17/101  soft-tissue]
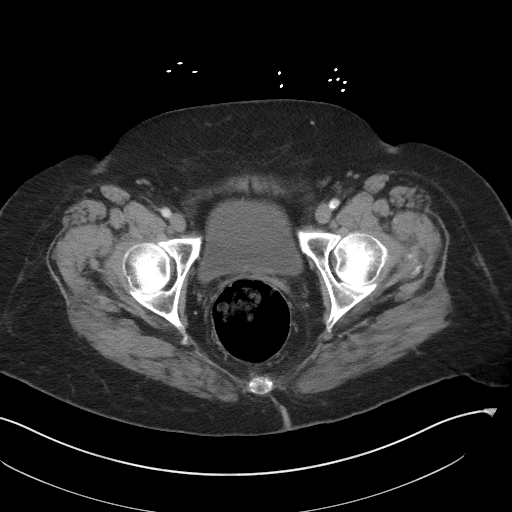
[im 23/101  soft-tissue]
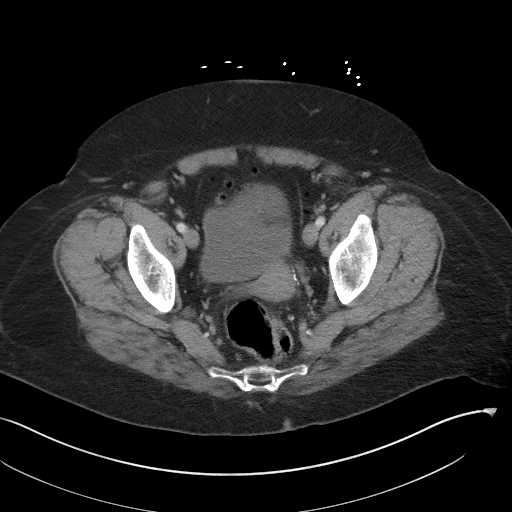
[im 28/101  soft-tissue]
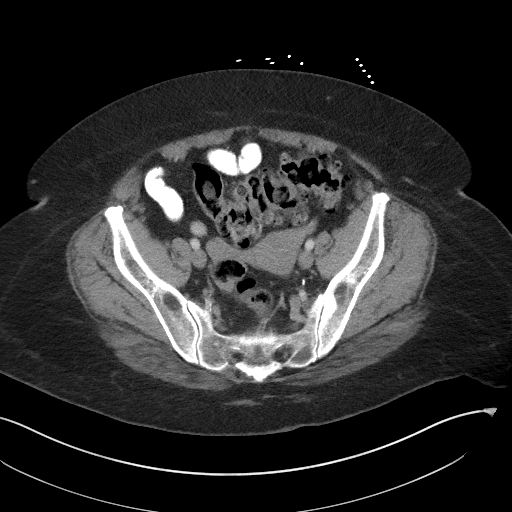
[im 39/101  soft-tissue]
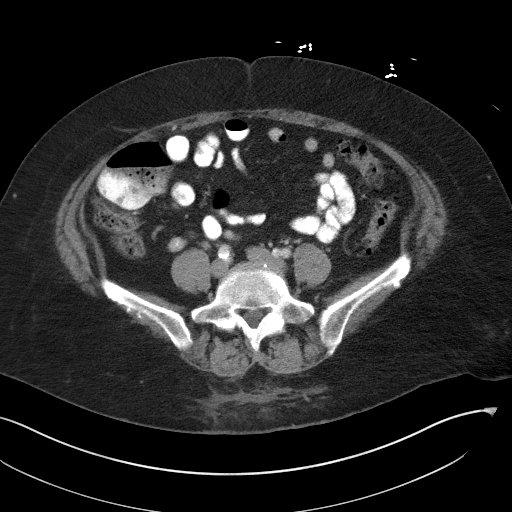
[im 45/101  soft-tissue]
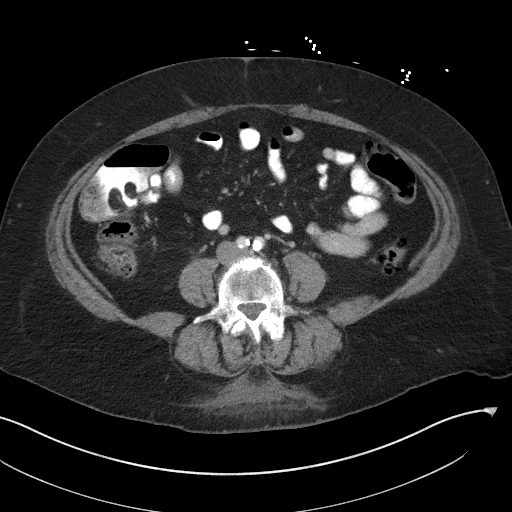
[im 56/101  soft-tissue]
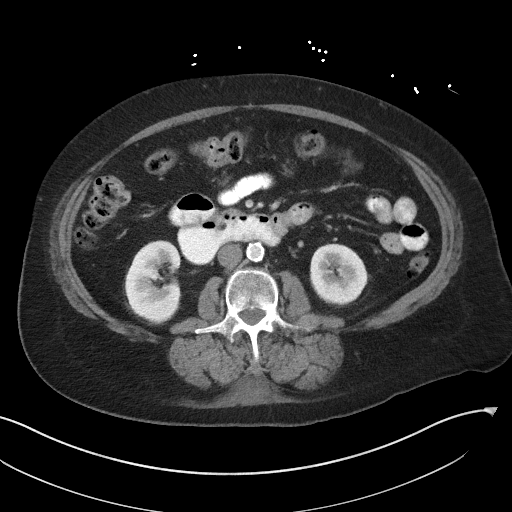
[im 62/101  soft-tissue]
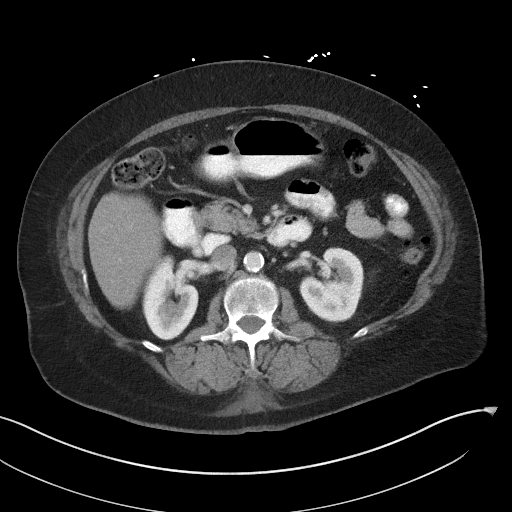
[im 73/101  soft-tissue]
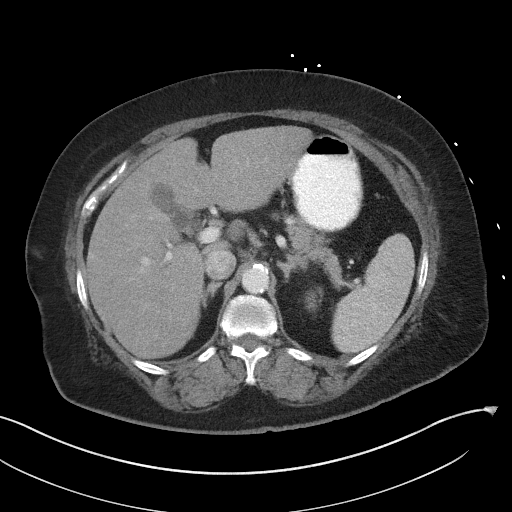
[im 73/101  bone]
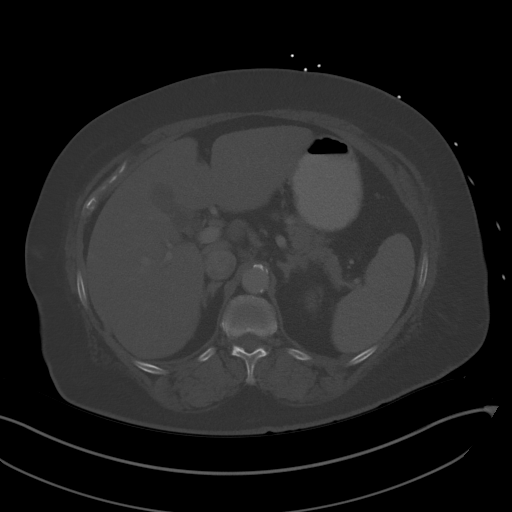
[im 78/101  soft-tissue]
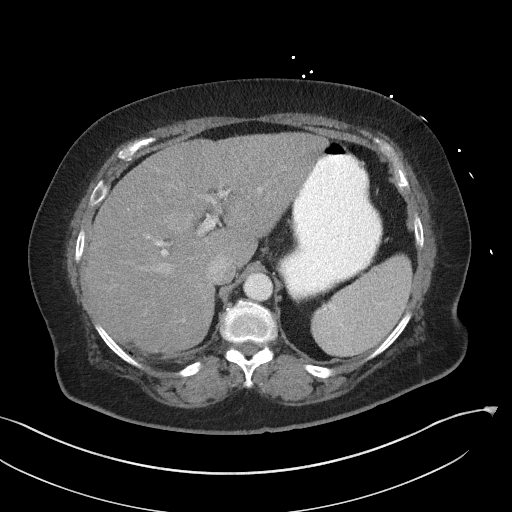
[im 84/101  soft-tissue]
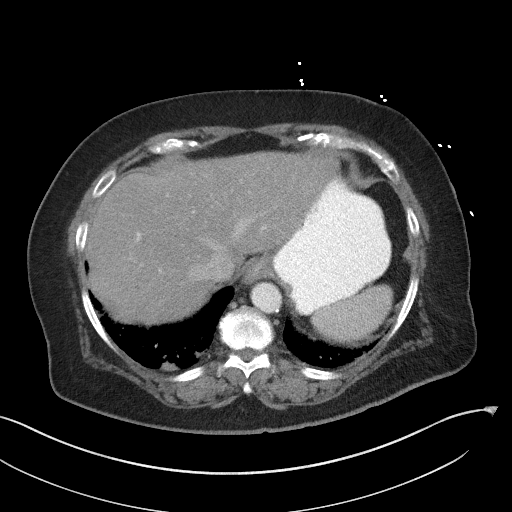
[im 95/101  soft-tissue]
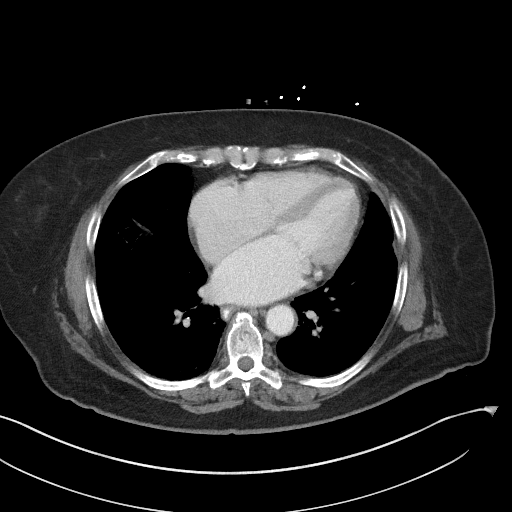

[Series 5: coronal st · coronal · 0.84mm/px · 3 of 104 slices shown]
[im 35/104  soft-tissue]
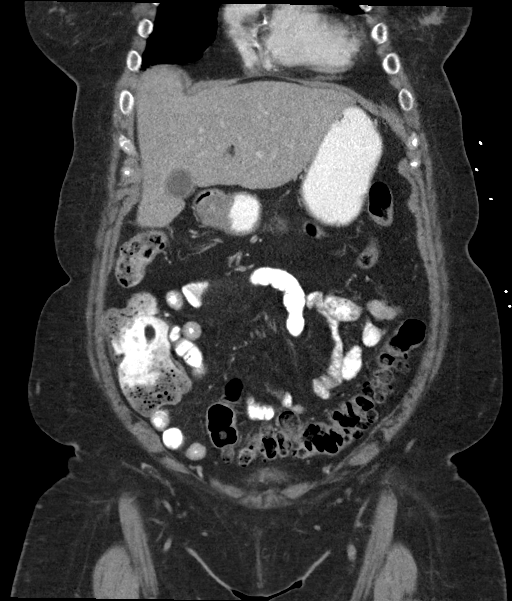
[im 46/104  soft-tissue]
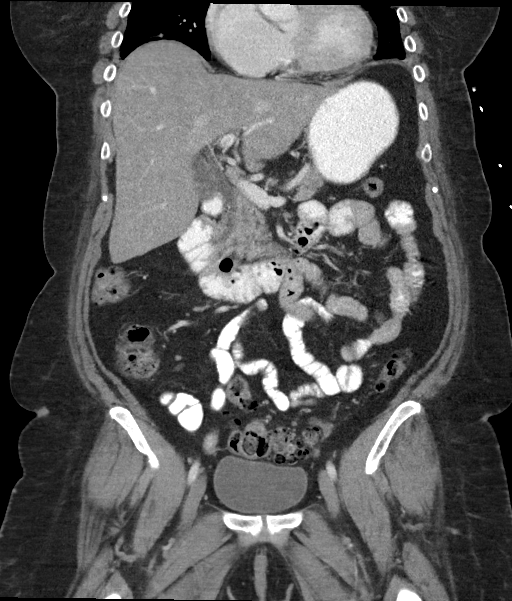
[im 58/104  soft-tissue]
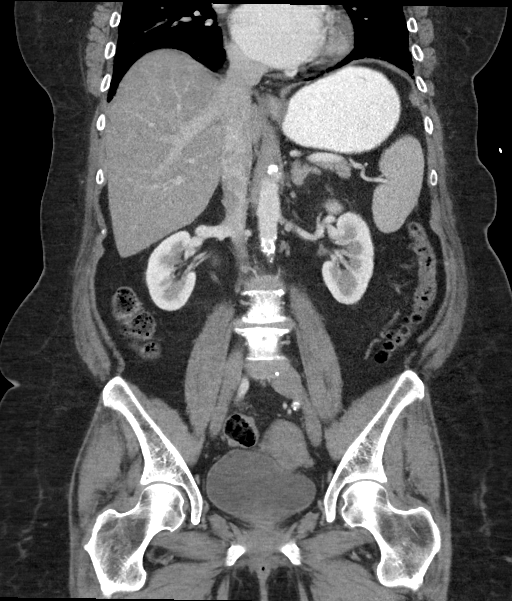

[15 of 46 positions shown; findings below may reference images not displayed]

FINDINGS: Lower chest: There is atelectatic change in each lung base. There is
a small area of apparent pneumonia in the posterior segment right
lower lobe. There are foci of coronary artery calcification. There
is oral contrast in the distal esophagus, likely indicative of a
degree of spontaneous gastroesophageal reflux.

Hepatobiliary: There is hepatic steatosis. No focal liver lesion is
appreciable. There appear to be gallstones, potentially intermingled
with sludge, within the gallbladder. No appreciable gallbladder wall
thickening evident. There is no evident biliary duct dilatation.

Pancreas: There is no pancreatic mass or inflammatory focus.

Spleen: No splenic lesions are evident.

Adrenals/Urinary Tract: Adrenals bilaterally appear unremarkable.
Kidneys bilaterally show no evident mass or hydronephrosis on either
side. There is no appreciable renal or ureteral calculus on either
side. Urinary bladder is midline with wall thickness within normal
limits.

Stomach/Bowel: There are sigmoid and descending colonic diverticula
without demonstrable diverticulitis. There is no appreciable bowel
wall or mesenteric thickening. No evident bowel obstruction.
Terminal ileum appears normal. There is mild lipomatous infiltration
of the ileocecal valve. There is no appreciable free air or portal
venous air. No findings suggesting bowel ischemia.

Vascular/Lymphatic: There is aortic and bilateral iliac artery
atherosclerosis. There is moderate calcification at the origin of
the celiac artery. The superior mesenteric artery appears patent.
There is no evident adenopathy in the abdomen or pelvis.

Reproductive: Uterus is anteverted.  No pelvic mass evident.

Other: Appendix appears normal. There is no abscess or ascites in
the abdomen or pelvis. There is a minimal ventral hernia containing
only fat. There is soft tissue opacification posterior to the
paraspinous muscles of the lumbar region slightly deep to the
subcutaneous regions, likely due to a degree of panniculitis. There
is also subcutaneous edema in this area.

Musculoskeletal: There is degenerative change in the lower thoracic
and lumbar regions. There is moderate spinal stenosis at L3-4 due to
disc protrusion and bony hypertrophy no intramuscular lesions are
evident.
IMPRESSION: 1.  Small focus of apparent pneumonia posterior right lung base.

2. Descending colonic and sigmoid diverticulosis without frank
diverticulitis. No bowel obstruction. No abscess in the abdomen
pelvis. Appendix appears normal.

3. No evident renal or ureteral calculus. No hydronephrosis. Urinary
bladder wall thickness normal.

4. Extensive aortic and iliac artery atherosclerosis. There appears
to be hemodynamically significant narrowing at the origin of the
left common iliac artery based on the degree of calcification
present in this area. Foci of coronary artery calcification also
noted.

5. Apparent cholelithiasis, likely intermingled with sludge. No
gallbladder wall thickening evident by CT.

6.  Hepatic steatosis.

7.  Moderate spinal stenosis at L3-4, multifactorial.

8. Probable panniculitis in the fat posterior to the paraspinous
muscles of the lumbar region with mild subcutaneous soft tissue
edema in this area.

## 2018-08-04 IMAGING — US ULTRASOUND ABDOMEN LIMITED
1 series · 14 of 25 positions shown · non-contrast
Comparison: CT from earlier in the same day.

CLINICAL DATA: Right upper quadrant pain and gallstones on recent
CT

EXAM:
ULTRASOUND ABDOMEN LIMITED RIGHT UPPER QUADRANT

[Series 1: ultrasound abdomen limited · 14 of 76 slices shown]
[im 1/76]
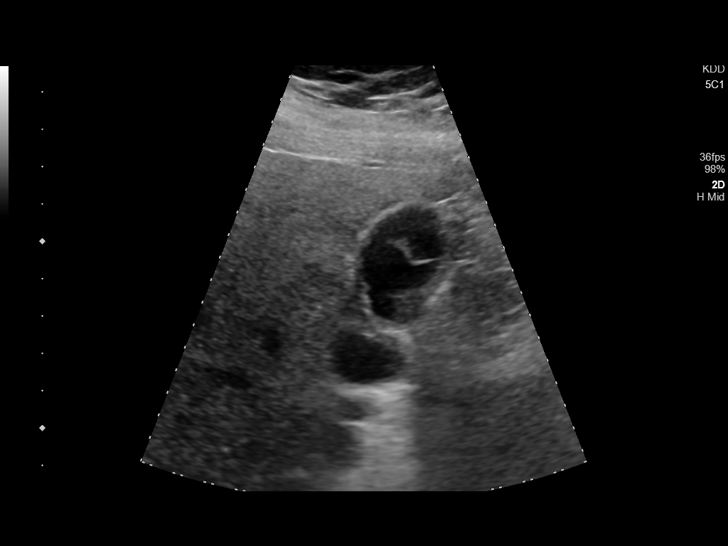
[im 7/76]
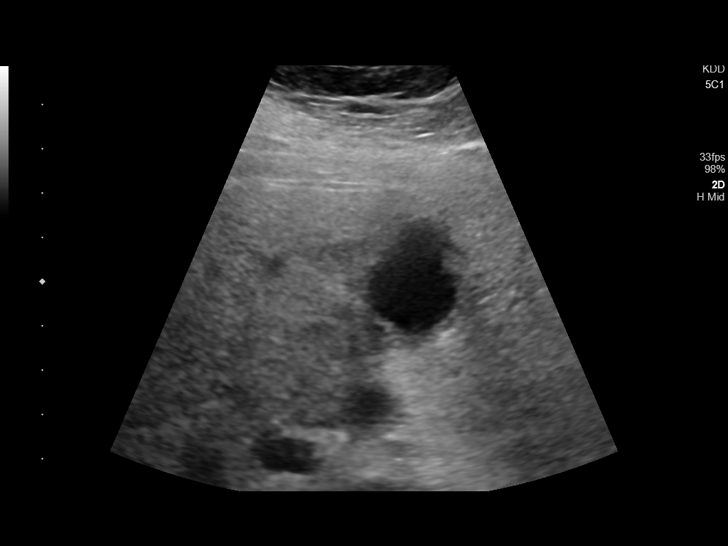
[im 13/76]
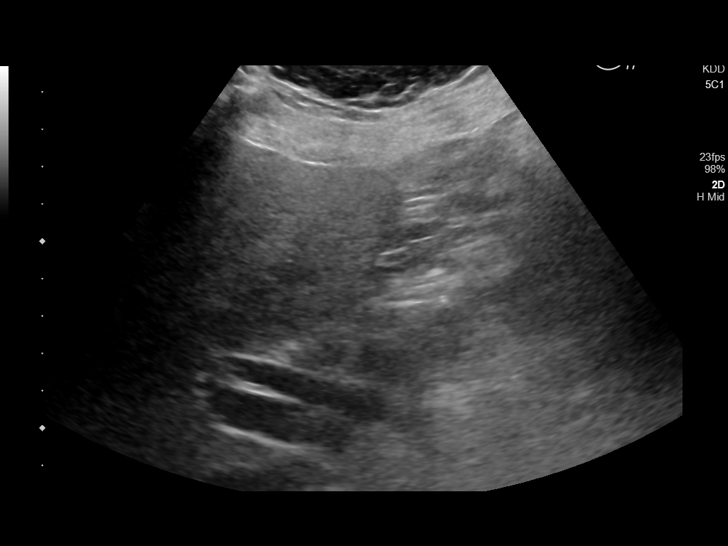
[im 19/76]
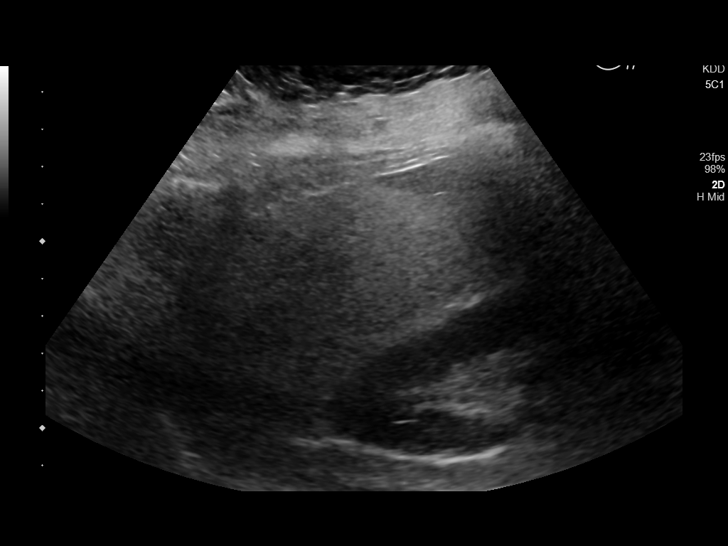
[im 26/76]
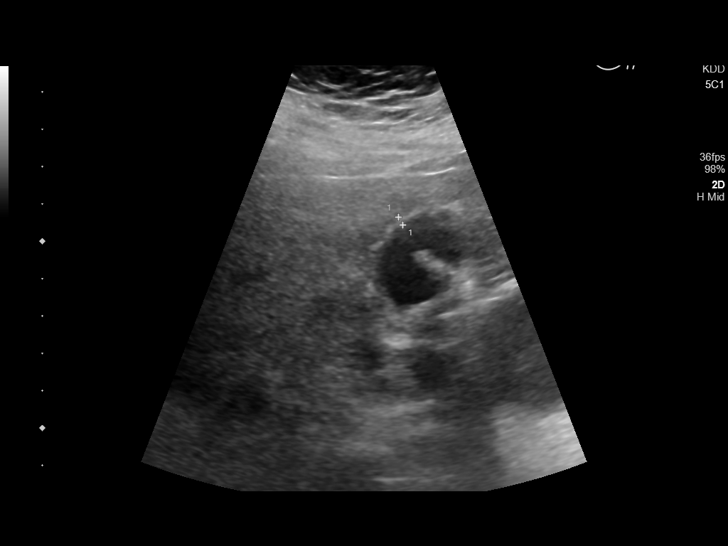
[im 29/76]
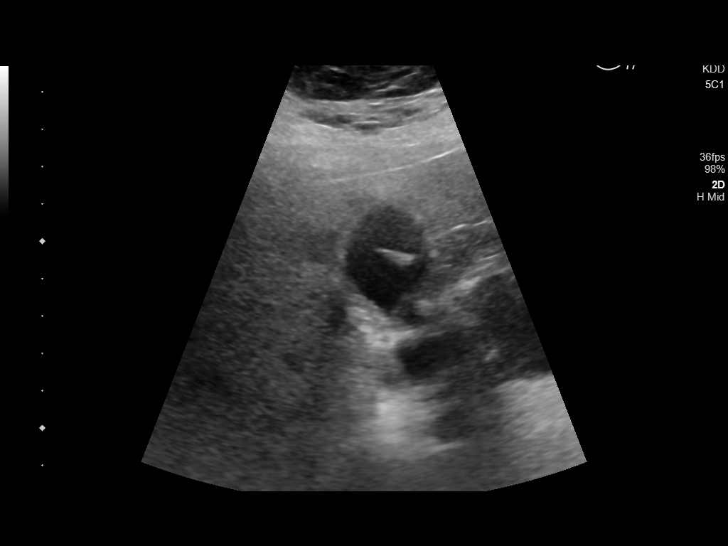
[im 35/76]
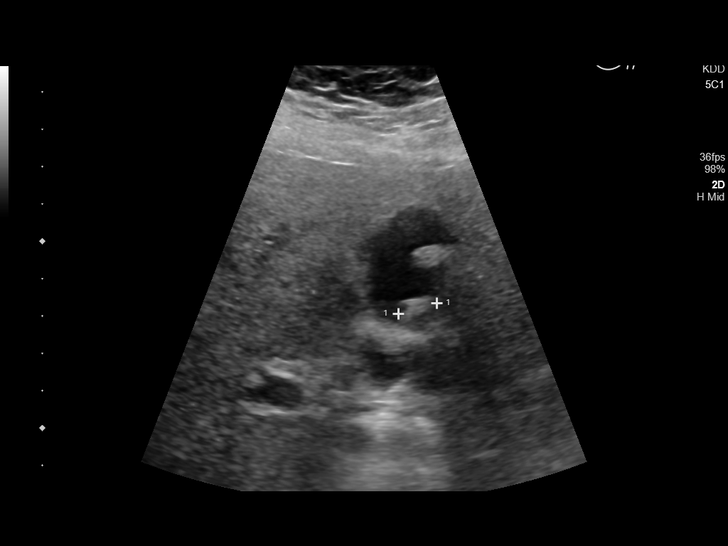
[im 41/76]
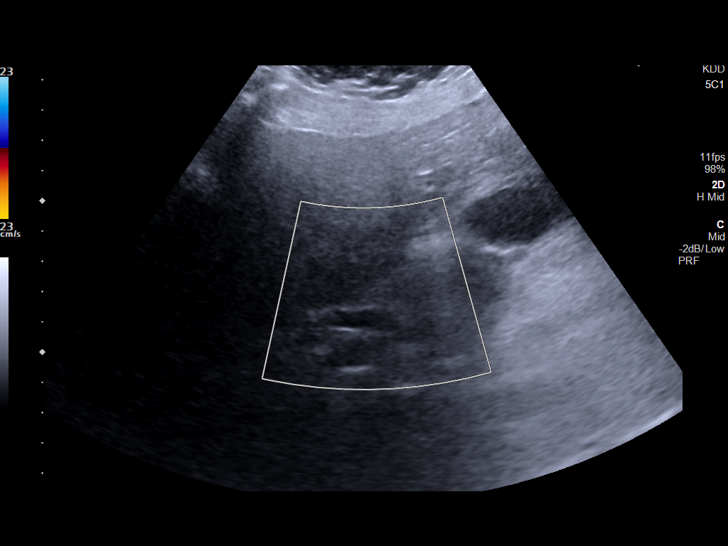
[im 47/76]
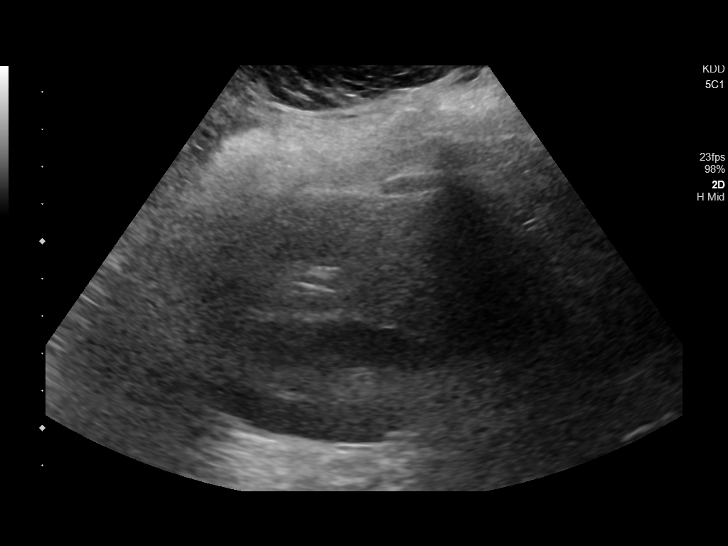
[im 51/76]
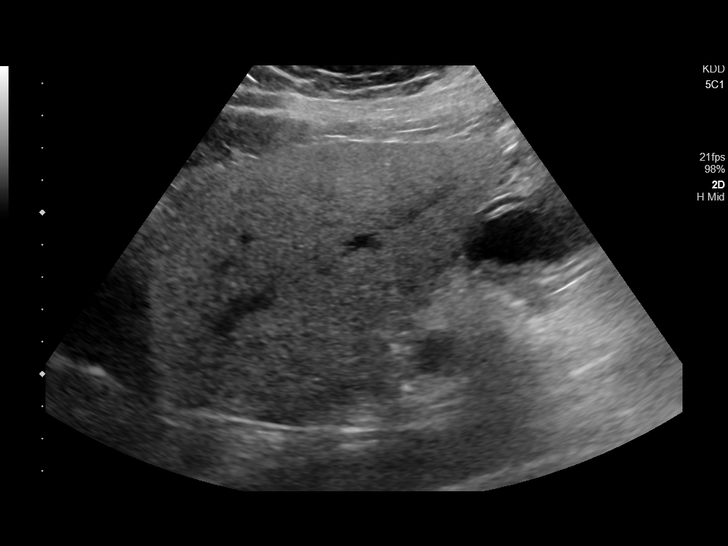
[im 57/76]
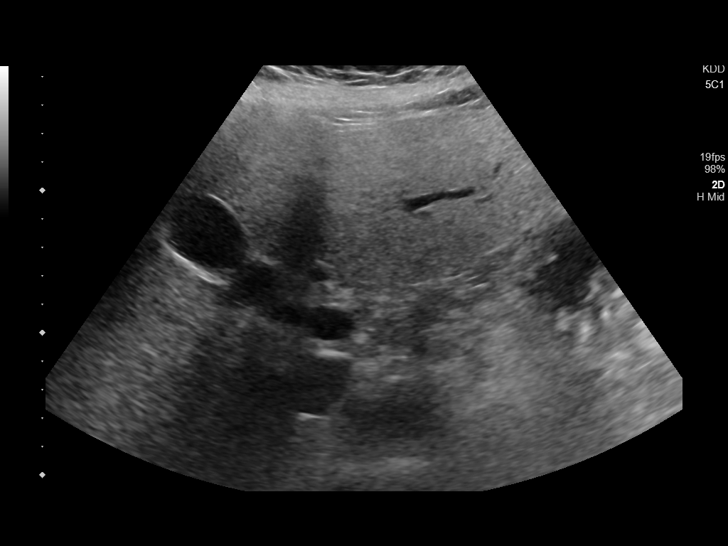
[im 63/76]
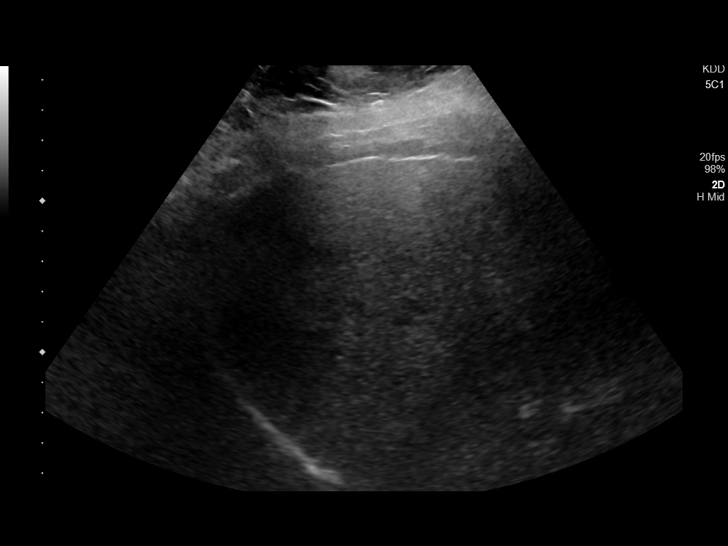
[im 69/76]
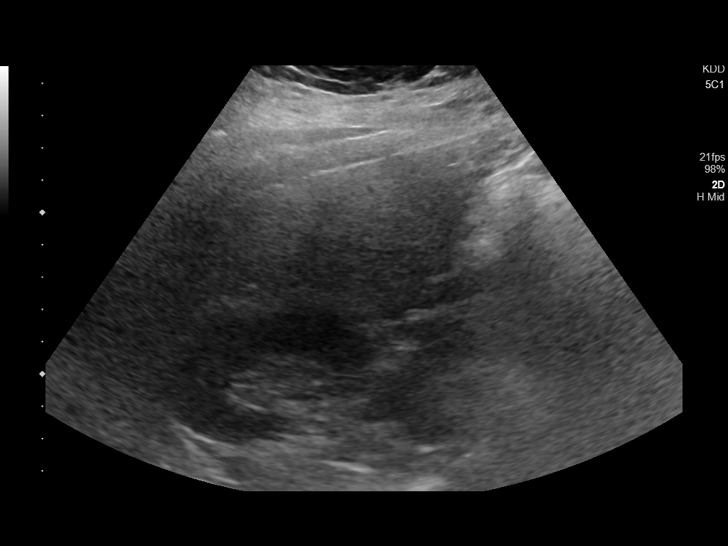
[im 76/76]
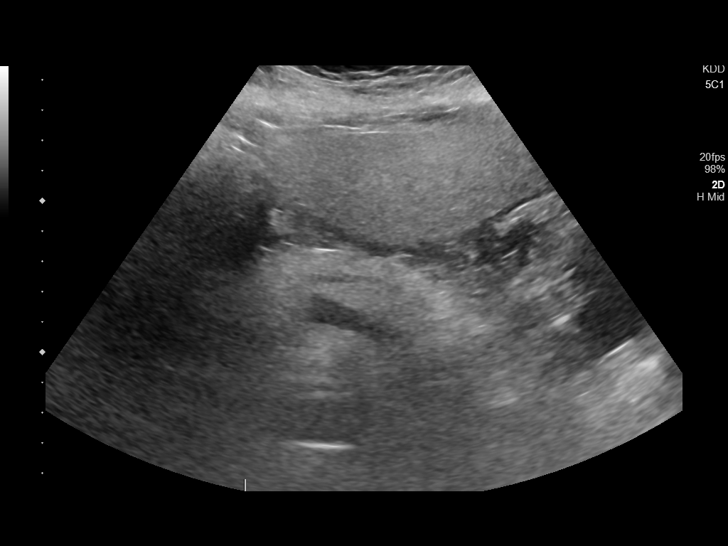

[14 of 25 positions shown; findings below may reference images not displayed]

FINDINGS: Gallbladder:

Gallbladder is well distended with multiple stones within. No wall
thickening or pericholecystic fluid is noted. Negative sonographic
Murphy's sign is noted.

Common bile duct:

Diameter: 6.2 mm.

Liver:

Increased in echogenicity consistent with fatty infiltration. No
focal mass is seen. Portal vein is patent on color Doppler imaging
with normal direction of blood flow towards the liver.
IMPRESSION: Fatty liver.

Cholelithiasis without complicating factors.

## 2018-08-04 MED ORDER — DICYCLOMINE HCL 10 MG/5ML PO SOLN
10.0000 mg | Freq: Once | ORAL | Status: AC
  Administered 2018-08-04: 10 mg via ORAL
  Filled 2018-08-04: qty 5

## 2018-08-04 MED ORDER — CEPHALEXIN 500 MG PO CAPS: 500.0000 mg | ORAL_CAPSULE | Freq: Four times a day (QID) | ORAL | 0 refills | Status: AC

## 2018-08-04 MED ORDER — MORPHINE SULFATE (PF) 4 MG/ML IV SOLN
INTRAVENOUS | Status: AC
  Filled 2018-08-04: qty 1

## 2018-08-04 MED ORDER — HYDROCODONE-ACETAMINOPHEN 5-325 MG PO TABS: 1.0000 | ORAL_TABLET | Freq: Four times a day (QID) | ORAL | 0 refills | Status: DC | PRN

## 2018-08-04 MED ORDER — ONDANSETRON HCL 4 MG/2ML IJ SOLN
4.0000 mg | Freq: Once | INTRAMUSCULAR | Status: AC
  Administered 2018-08-04: 4 mg via INTRAVENOUS
  Filled 2018-08-04: qty 2

## 2018-08-04 MED ORDER — IOHEXOL 300 MG/ML  SOLN
100.0000 mL | Freq: Once | INTRAMUSCULAR | Status: AC | PRN
  Administered 2018-08-04: 10:00:00 100 mL via INTRAVENOUS

## 2018-08-04 MED ORDER — MORPHINE SULFATE (PF) 4 MG/ML IV SOLN
4.0000 mg | Freq: Once | INTRAVENOUS | Status: AC
  Administered 2018-08-04: 4 mg via INTRAVENOUS

## 2018-08-04 MED ORDER — ESOMEPRAZOLE MAGNESIUM 40 MG PO CPDR: 40.0000 mg | DELAYED_RELEASE_CAPSULE | Freq: Every day | ORAL | 1 refills | Status: DC

## 2018-08-04 MED ORDER — LIDOCAINE VISCOUS HCL 2 % MT SOLN
15.0000 mL | Freq: Once | OROMUCOSAL | Status: AC
  Administered 2018-08-04: 07:00:00 15 mL via ORAL
  Filled 2018-08-04: qty 15

## 2018-08-04 MED ORDER — MORPHINE SULFATE (PF) 2 MG/ML IV SOLN
2.0000 mg | Freq: Once | INTRAVENOUS | Status: AC
  Administered 2018-08-04: 08:00:00 2 mg via INTRAVENOUS
  Filled 2018-08-04: qty 1

## 2018-08-04 MED ORDER — ONDANSETRON HCL 4 MG/2ML IJ SOLN
4.0000 mg | Freq: Once | INTRAMUSCULAR | Status: AC
  Administered 2018-08-04: 09:00:00 4 mg via INTRAVENOUS

## 2018-08-04 MED ORDER — ALUM & MAG HYDROXIDE-SIMETH 200-200-20 MG/5ML PO SUSP
30.0000 mL | Freq: Once | ORAL | Status: AC
  Administered 2018-08-04: 30 mL via ORAL
  Filled 2018-08-04: qty 30

## 2018-08-04 MED ORDER — ONDANSETRON HCL 4 MG/2ML IJ SOLN
INTRAMUSCULAR | Status: AC
  Filled 2018-08-04: qty 2

## 2018-08-04 MED ORDER — IOHEXOL 240 MG/ML SOLN
50.0000 mL | Freq: Once | INTRAMUSCULAR | Status: AC | PRN
  Administered 2018-08-04: 50 mL via ORAL

## 2018-08-04 NOTE — Discharge Instructions (Signed)
Please return for worse pain, any shortness of breath, fever or vomiting.  Please follow-up with Dr. Ouida Sills your regular doctor, call him today to arrange follow-up in the next few days.  I will give you some Keflex to take as directed for your UTI and mild pneumonia.  Have your doctor keep an eye on your gallbladder as well.  He can refer you to the surgeon again if you need surgery.  I will also give you some Nexium to take 1 a day to see if it helps with the epigastric pain you are having.  If the pain gets very bad you can take Vicodin 1 pill 4 times a day.  Be careful of Vicodin can make you woozy and constipated.  Do not fall and do not drive if you are taking it.

## 2018-08-04 NOTE — ED Notes (Signed)
Patient transported to CT 

## 2018-08-04 NOTE — ED Triage Notes (Signed)
Patient ambulatory to triage with steady gait, without difficulty or distress noted; pt reports mid upper abd pain radiating around into back since last Friday with no accomp symptoms; no hx of same

## 2018-08-04 NOTE — ED Notes (Signed)
ED Provider at bedside. 

## 2018-08-04 NOTE — ED Notes (Signed)
Pt ambulatory to room 15 to be placed on card monitor for EKG and further eval by EDT Thedore Mins; report called to care nurse Philis Pique, RN

## 2018-08-04 NOTE — ED Notes (Signed)
Pt reports bilateral upper abdominal pain with radiation into her back since last Friday. Pt denies c/o N/V/D, no CP or SHOB, and denies previous abdominal surgeries.

## 2018-08-04 NOTE — ED Provider Notes (Signed)
[facility] Emergency Department Provider Note   ____________________________________________   First MD Initiated Contact with Patient 08/04/18 [phone]     (approximate)  I have reviewed the triage vital signs and the nursing notes.   HISTORY  Chief Complaint Abdominal Pain    HPI Stacy Reese is a 73 y.o. female patient reports epigastric pain radiating around to both sides of the abdomen since this past Friday.  No nausea vomiting or diarrhea no chest pain not short of breath no fever, no change with food no change with position or walking just hurts when you push on it.   Pain is moderate in intensity just kind of pain achy in nature.      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   Hypercholesterolemia    Hypertension    Personal history of chemotherapy    1 round   Personal history of radiation therapy    Skin cancer    squamous cell   Squamous cell carcinoma of skin    status post exicision    Patient Active Problem List   Diagnosis Date Noted   Heterozygous factor V Leiden mutation ([place]) 06/02/2016   Pulmonary embolism ([place]) 12/04/2015   Primary cancer of lower outer quadrant of left female breast ([place]) 09/22/2014    Past Surgical History:  Procedure Laterality Date   BREAST BIOPSY Left 09/04/2013   negative stereo bx   BREAST BIOPSY Left 08/25/2013   postive [place] core bx   BREAST LUMPECTOMY Left 08/2017   BREAST LUMPECTOMY W/ NEEDLE LOCALIZATION Left 2015   GANGLION CYST EXCISION      Prior to Admission medications   Medication Sig Start Date End Date Taking? Authorizing Provider  aspirin EC 81 MG tablet Take 81 mg by mouth daily.    Yes [provider]  buPROPion (WELLBUTRIN SR) 150 MG 12 hr tablet Take 150 mg by mouth daily.  02/12/14  Yes [provider]  busPIRone (BUSPAR) 10 MG tablet Take 10 mg by mouth  daily.    Yes [provider]  citalopram (CELEXA) 20 MG tablet Take 20 mg by mouth daily.    Yes [provider]  diltiazem (TIAZAC) 240 MG 24 hr capsule Take 240 mg by mouth daily.  07/13/14  Yes [provider]  letrozole ([place]) 2.5 MG tablet TAKE ONE TABLET BY MOUTH DAILY 07/27/18  Yes Lloyd Huger, MD  LORazepam (ATIVAN) 1 MG tablet Take 1 mg by mouth as needed.    Yes [provider]  pravastatin (PRAVACHOL) 40 MG tablet Take 40 mg by mouth daily.    Yes [provider]  triamterene-hydrochlorothiazide (MAXZIDE-25) 37.5-25 MG per tablet Take 1 tablet by mouth daily.    Yes [provider]  warfarin (COUMADIN) 2 MG tablet Take 2 mg by mouth daily.  12/08/17 12/08/18 Yes [provider]  cephALEXin (KEFLEX) 500 MG capsule Take 1 capsule (500 mg total) by mouth 4 (four) times daily for 10 days. 08/04/18 08/14/18  Nena Polio, MD  esomeprazole (NEXIUM) 40 MG capsule Take 1 capsule (40 mg total) by mouth daily. 08/04/18 08/04/19  Nena Polio, MD  HYDROcodone-acetaminophen (NORCO/VICODIN) 5-325 MG tablet Take 1 tablet by mouth every 6 (six) hours as needed for moderate pain. 08/04/18   Nena Polio, MD    Allergies Metoprolol and Other  Family History  Problem Relation  Age of Onset   Cancer Father        colon   Breast cancer Neg Hx     Social History Social History   Tobacco Use   Smoking status: Former Smoker   Smokeless tobacco: Never Used  Substance Use Topics   Alcohol use: Yes    Comment: social   Drug use: No    Review of Systems  Constitutional: No fever/chills Eyes: No visual changes. ENT: No sore throat. Cardiovascular: Denies chest pain. Respiratory: Denies shortness of breath. Gastrointestinal: See HPI Genitourinary: Negative for dysuria. Musculoskeletal: Negative for back pain. Skin: Negative for rash. Neurological: Negative for headaches, focal weakness    ____________________________________________   PHYSICAL EXAM:  VITAL SIGNS: ED Triage Vitals  Enc Vitals Group     BP 08/04/18 0622 (!) 155/101     Pulse Rate 08/04/18 0622 (!) 114     Resp 08/04/18 0622 (!) 23     Temp 08/04/18 0622 98.7 F (37.1 C)     Temp Source 08/04/18 0622 Oral     SpO2 08/04/18 0622 98 %     Weight 08/04/18 0615 200 lb (90.7 kg)     Height 08/04/18 0615 5\' 5"  (1.651 m)     Head Circumference --      Peak Flow --      Pain Score 08/04/18 0615 8     Pain Loc --      Pain Edu? --      Excl. in [place]? --     Constitutional: Alert and oriented. Well appearing and in no acute distress. Eyes: Conjunctivae are normal. PERRL. EOMI. Head: Atraumatic. Nose: No congestion/rhinnorhea. Mouth/Throat: Mucous membranes are moist.  Oropharynx non-erythematous. Neck: No stridor.   Cardiovascular: Normal rate, regular rhythm. Grossly normal heart sounds.  Good peripheral circulation. Respiratory: Normal respiratory effort.  No retractions. Lungs CTAB. Gastrointestinal: Soft and nontender except for tender to palpation in the epigastric area.. No distention. No abdominal bruits. No CVA tenderness. Musculoskeletal: No lower extremity tenderness nor edema.   Neurologic:  Normal speech and language. No gross focal neurologic deficits are appreciated. Skin:  Skin is warm, dry and intact. No rash noted. Psychiatric: Mood and affect are normal. Speech and behavior are normal.  ____________________________________________   LABS (all labs ordered are listed, but only abnormal results are displayed)  Labs Reviewed  COMPREHENSIVE METABOLIC PANEL - Abnormal; Notable for the following components:      Result Value   Potassium 3.3 (*)    Glucose, Bld 204 (*)    All other components within normal limits  CBC - Abnormal; Notable for the following components:   RBC 5.21 (*)    All other components within normal limits  URINALYSIS, COMPLETE (UACMP) WITH MICROSCOPIC -  Abnormal; Notable for the following components:   Color, Urine YELLOW (*)    APPearance HAZY (*)    Glucose, UA 50 (*)    Hgb urine dipstick SMALL (*)    Leukocytes,Ua LARGE (*)    Bacteria, UA MANY (*)    Non Squamous Epithelial PRESENT (*)    All other components within normal limits  CBC WITH DIFFERENTIAL/PLATELET - Abnormal; Notable for the following components:   RBC 5.20 (*)    All other components within normal limits  LIPASE, BLOOD   ____________________________________________  EKG  EKG read interpreted by me shows atrial fibrillation at a rate of 107 normal axis nonspecific ST-T wave changes ____________________________________________  RADIOLOGY  ED MD interpretation:  Official radiology  report(s): Ct Abdomen Pelvis W Contrast  Result Date: 08/04/2018 CLINICAL DATA:  Abdominal pain, radiating toward back EXAM: CT ABDOMEN AND PELVIS WITH CONTRAST TECHNIQUE: Multidetector CT imaging of the abdomen and pelvis was performed using the standard protocol following bolus administration of intravenous contrast. Oral contrast was also administered. CONTRAST:  175mL OMNIPAQUE IOHEXOL 300 MG/ML  SOLN COMPARISON:  None. FINDINGS: Lower chest: There is atelectatic change in each lung base. There is a small area of apparent pneumonia in the posterior segment right lower lobe. There are foci of coronary artery calcification. There is oral contrast in the distal esophagus, likely indicative of a degree of spontaneous gastroesophageal reflux. Hepatobiliary: There is hepatic steatosis. No focal liver lesion is appreciable. There appear to be gallstones, potentially intermingled with sludge, within the gallbladder. No appreciable gallbladder wall thickening evident. There is no evident biliary duct dilatation. Pancreas: There is no pancreatic mass or inflammatory focus. Spleen: No splenic lesions are evident. Adrenals/Urinary Tract: Adrenals bilaterally appear unremarkable. Kidneys bilaterally  show no evident mass or hydronephrosis on either side. There is no appreciable renal or ureteral calculus on either side. Urinary bladder is midline with wall thickness within normal limits. Stomach/Bowel: There are sigmoid and descending colonic diverticula without demonstrable diverticulitis. There is no appreciable bowel wall or mesenteric thickening. No evident bowel obstruction. Terminal ileum appears normal. There is mild lipomatous infiltration of the ileocecal valve. There is no appreciable free air or portal venous air. No findings suggesting bowel ischemia. Vascular/Lymphatic: There is aortic and bilateral iliac artery atherosclerosis. There is moderate calcification at the origin of the celiac artery. The superior mesenteric artery appears patent. There is no evident adenopathy in the abdomen or pelvis. Reproductive: Uterus is anteverted.  No pelvic mass evident. Other: Appendix appears normal. There is no abscess or ascites in the abdomen or pelvis. There is a minimal ventral hernia containing only fat. There is soft tissue opacification posterior to the paraspinous muscles of the lumbar region slightly deep to the subcutaneous regions, likely due to a degree of panniculitis. There is also subcutaneous edema in this area. Musculoskeletal: There is degenerative change in the lower thoracic and lumbar regions. There is moderate spinal stenosis at L3-4 due to disc protrusion and bony hypertrophy no intramuscular lesions are evident. IMPRESSION: 1.  Small focus of apparent pneumonia posterior right lung base. 2. Descending colonic and sigmoid diverticulosis without frank diverticulitis. No bowel obstruction. No abscess in the abdomen pelvis. Appendix appears normal. 3. No evident renal or ureteral calculus. No hydronephrosis. Urinary bladder wall thickness normal. 4. Extensive aortic and iliac artery atherosclerosis. There appears to be hemodynamically significant narrowing at the origin of the left common  iliac artery based on the degree of calcification present in this area. Foci of coronary artery calcification also noted. 5. Apparent cholelithiasis, likely intermingled with sludge. No gallbladder wall thickening evident by CT. 6.  Hepatic steatosis. 7.  Moderate spinal stenosis at L3-4, multifactorial. 8. 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 Electronically Signed   By: Lowella Grip III M.D.   On: 08/04/2018 10:21   US Abdomen Limited Ruq  Result Date: 08/04/2018 CLINICAL DATA:  Right upper quadrant pain and gallstones on recent CT EXAM: ULTRASOUND ABDOMEN LIMITED RIGHT UPPER QUADRANT COMPARISON:  CT from earlier in the same day. FINDINGS: Gallbladder: Gallbladder is well distended with multiple stones within. No wall thickening or pericholecystic fluid is noted. Negative  sonographic Murphy's sign is noted. Common bile duct: Diameter: 6.2 mm. Liver: Increased in echogenicity consistent with fatty infiltration. No focal mass is seen. Portal vein is patent on color Doppler imaging with normal direction of blood flow towards the liver. IMPRESSION: Fatty liver. Cholelithiasis without complicating factors. Electronically Signed   By: Inez Catalina M.D.   On: 08/04/2018 11:36    ____________________________________________   PROCEDURES  Procedure(s) performed (including Critical Care):  Procedures   ____________________________________________   INITIAL IMPRESSION / ASSESSMENT AND PLAN / ED COURSE  Labs show nothing that would be explaining the abdominal pain which is not in the CVA area and not suprapubically.  GI cocktail did not help we will CT this lady to make sure that her age she has no other explanation for the pain.   CT scan was done does not appear to show a cause for her epigastric pain.  Surgery also saw the patient and evaluated her does not think that the pain she is  having is worrisome for ischemic bowel at this point.  Patient is going to have close follow-up and will return if worse.  We will treat her for her pneumonia and UTI first.          ____________________________________________   FINAL CLINICAL IMPRESSION(S) / ED DIAGNOSES  Final diagnoses:  Epigastric pain  Community acquired pneumonia of right lower lobe of lung ([place])  Urinary tract infection with hematuria, site unspecified     ED Discharge Orders         Ordered    cephALEXin (KEFLEX) 500 MG capsule  4 times daily     08/04/18 1226    HYDROcodone-acetaminophen (NORCO/VICODIN) 5-325 MG tablet  Every 6 hours PRN     08/04/18 1226    esomeprazole (NEXIUM) 40 MG capsule  Daily     08/04/18 1227           Note:  This document was prepared using Dragon voice recognition software and may include unintentional dictation errors.    Nena Polio, MD 08/04/18 [phone]

## 2018-08-04 NOTE — Consult Note (Signed)
Subjective:   CC: biliary colic  HPI:  Stacy Reese is a 73 y.o. female who is consulted by [facility] for evaluation of above cc.  Symptoms were first noted 6 days ago. Pain is sharp, episodic, located in RLQ radiating around toward the flank, occurring more at night.  Associated with nothing specific, exacerbated by nothing specific.      Denies rashes, nausea, vomiting.  Tolerating diet.  She states pain has improved now since being treated in ED.  Workup showed sludge in GB, and incidental RLL pneumonia.  No respiratory issues, including SOB, cough, wheeze.  Past Medical History:  has a past medical history of Breast cancer ([place]) (08/2013), Hypercholesterolemia, Hypertension, Personal history of chemotherapy, Personal history of radiation therapy, Skin cancer, and Squamous cell carcinoma of skin.  Past Surgical History:  has a past surgical history that includes Ganglion cyst excision; Breast lumpectomy w/ needle localization (Left, 2015); Breast biopsy (Left, 09/04/2013); Breast biopsy (Left, 08/25/2013); and Breast lumpectomy (Left, 08/2017).  Family History: family history includes Cancer in her father.  Social History:  reports that she has quit smoking. She has never used smokeless tobacco. She reports current alcohol use. She reports that she does not use drugs.  Current Medications: (Not in a hospital admission)   Allergies:  Allergies as of 08/04/2018 - Review Complete 08/04/2018  Allergen Reaction Noted  . Metoprolol Shortness Of Breath 07/11/2014  . Other Other (See Comments) 07/11/2014    ROS:  General: Denies weight loss, weight gain, fatigue, fevers, chills, and night sweats. Eyes: Denies blurry vision, double vision, eye pain, itchy eyes, and tearing. Ears: Denies hearing loss, earache, and ringing in ears. Nose: Denies sinus pain, congestion, infections, runny nose, and nosebleeds. Mouth/throat: Denies hoarseness, sore throat, bleeding gums, and difficulty  swallowing. Heart: Denies chest pain, palpitations, racing heart, irregular heartbeat, leg pain or swelling, and decreased activity tolerance. Respiratory: Denies breathing difficulty, shortness of breath, wheezing, cough, and sputum. GI: Denies change in appetite, heartburn, nausea, vomiting, constipation, diarrhea, and blood in stool. GU: Denies difficulty urinating, pain with urinating, urgency, frequency, blood in urine. Musculoskeletal: Denies joint stiffness, pain, swelling, muscle weakness. Skin: Denies rash, itching, mass, tumors, sores, and boils Neurologic: Denies headache, fainting, dizziness, seizures, numbness, and tingling. Psychiatric: Denies depression, anxiety, difficulty sleeping, and memory loss. Endocrine: Denies heat or cold intolerance, and increased thirst or urination. Blood/lymph: Denies easy bruising, easy bruising, and swollen glands     Objective:     BP (!) 139/95 (BP Location: Left Arm)   Pulse 96   Temp 97.9 F (36.6 C) (Oral)   Resp 18   Ht 5\' 5"  (1.651 m)   Wt 90.7 kg   SpO2 97%   BMI 33.28 kg/m    Constitutional :  alert, cooperative, appears stated age and no distress  Lymphatics/Throat:  no asymmetry, masses, or scars  Respiratory:  clear to auscultation bilaterally  Cardiovascular:  irregularly irregular rhythm  Gastrointestinal: soft, non-tender; bowel sounds normal; no masses,  no organomegaly.   Musculoskeletal: Steady gait and movement  Skin: Cool and moist, no rash.  Psychiatric: Normal affect, non-agitated, not confused       LABS:  CMP Latest Ref Rng & Units 08/04/2018 11/16/2013 11/09/2013  Glucose 70 - 99 mg/dL 204(H) 168(H) 145(H)  BUN 8 - 23 mg/dL 17 14 10   Creatinine 0.44 - 1.00 mg/dL 0.82 1.19 1.09  Sodium 135 - 145 mmol/L 135 132(L) 139  Potassium 3.5 - 5.1 mmol/L 3.3(L) 3.3(L) 3.2(L)  Chloride 98 - 111 mmol/L 100 92(L) 101  CO2 22 - 32 mmol/L 24 25 28   Calcium 8.9 - 10.3 mg/dL 9.8 9.0 8.6  Total Protein 6.5 - 8.1 g/dL  7.9 7.6 7.5  Total Bilirubin 0.3 - 1.2 mg/dL 0.7 1.0 0.7  Alkaline Phos 38 - 126 U/L 82 80 97  AST 15 - 41 U/L 34 34 26  ALT 0 - 44 U/L 32 42 41   CBC Latest Ref Rng & Units 08/04/2018 08/04/2018 02/20/2014  WBC 4.0 - 10.5 K/uL 9.7 9.7 7.7  Hemoglobin 12.0 - 15.0 g/dL 15.0 15.0 15.0  Hematocrit 36.0 - 46.0 % 44.0 43.9 44.8  Platelets 150 - 400 K/uL 223 214 208     RADS: Study Result   CLINICAL DATA:  Abdominal pain, radiating toward back  EXAM: CT ABDOMEN AND PELVIS WITH CONTRAST  TECHNIQUE: Multidetector CT imaging of the abdomen and pelvis was performed using the standard protocol following bolus administration of intravenous contrast. Oral contrast was also administered.  CONTRAST:  145mL OMNIPAQUE IOHEXOL 300 MG/ML  SOLN  COMPARISON:  None.  FINDINGS: Lower chest: There is atelectatic change in each lung base. There is a small area of apparent pneumonia in the posterior segment right lower lobe. There are foci of coronary artery calcification. There is oral contrast in the distal esophagus, likely indicative of a degree of spontaneous gastroesophageal reflux.  Hepatobiliary: There is hepatic steatosis. No focal liver lesion is appreciable. There appear to be gallstones, potentially intermingled with sludge, within the gallbladder. No appreciable gallbladder wall thickening evident. There is no evident biliary duct dilatation.  Pancreas: There is no pancreatic mass or inflammatory focus.  Spleen: No splenic lesions are evident.  Adrenals/Urinary Tract: Adrenals bilaterally appear unremarkable. Kidneys bilaterally show no evident mass or hydronephrosis on either side. There is no appreciable renal or ureteral calculus on either side. Urinary bladder is midline with wall thickness within normal limits.  Stomach/Bowel: There are sigmoid and descending colonic diverticula without demonstrable diverticulitis. There is no appreciable bowel wall or mesenteric  thickening. No evident bowel obstruction. Terminal ileum appears normal. There is mild lipomatous infiltration of the ileocecal valve. There is no appreciable free air or portal venous air. No findings suggesting bowel ischemia.  Vascular/Lymphatic: There is aortic and bilateral iliac artery atherosclerosis. There is moderate calcification at the origin of the celiac artery. The superior mesenteric artery appears patent. There is no evident adenopathy in the abdomen or pelvis.  Reproductive: Uterus is anteverted.  No pelvic mass evident.  Other: Appendix appears normal. There is no abscess or ascites in the abdomen or pelvis. There is a minimal ventral hernia containing only fat. There is soft tissue opacification posterior to the paraspinous muscles of the lumbar region slightly deep to the subcutaneous regions, likely due to a degree of panniculitis. There is also subcutaneous edema in this area.  Musculoskeletal: There is degenerative change in the lower thoracic and lumbar regions. There is moderate spinal stenosis at L3-4 due to disc protrusion and bony hypertrophy no intramuscular lesions are evident.  IMPRESSION: 1.  Small focus of apparent pneumonia posterior right lung base.  2. Descending colonic and sigmoid diverticulosis without frank diverticulitis. No bowel obstruction. No abscess in the abdomen pelvis. Appendix appears normal.  3. No evident renal or ureteral calculus. No hydronephrosis. Urinary bladder wall thickness normal.  4. Extensive aortic and iliac artery atherosclerosis. There appears to be hemodynamically significant narrowing at the origin of the left common iliac artery based  on the degree of calcification present in this area. Foci of coronary artery calcification also noted.  5. Apparent cholelithiasis, likely intermingled with sludge. No gallbladder wall thickening evident by CT.  6.  Hepatic steatosis.  7.  Moderate spinal  stenosis at L3-4, multifactorial.  8. 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   Electronically Signed   By: Lowella Grip III M.D.   On: 08/04/2018 10:21   CLINICAL DATA:  Right upper quadrant pain and gallstones on recent CT  EXAM: ULTRASOUND ABDOMEN LIMITED RIGHT UPPER QUADRANT  COMPARISON:  CT from earlier in the same day.  FINDINGS: Gallbladder:  Gallbladder is well distended with multiple stones within. No wall thickening or pericholecystic fluid is noted. Negative sonographic Murphy's sign is noted.  Common bile duct:  Diameter: 6.2 mm.  Liver:  Increased in echogenicity consistent with fatty infiltration. No focal mass is seen. Portal vein is patent on color Doppler imaging with normal direction of blood flow towards the liver.  IMPRESSION: Fatty liver.  Cholelithiasis without complicating factors.   Electronically Signed   By: Inez Catalina M.D.   On: 08/04/2018 11:36  Assessment:      RUQ pain, sludge noted in GB, but no signs of cholecystitis on imaging, wbc normal, pain has now resolved.  Incidental RLL pneumonia  On coumadin for hx of afib, DVT, PE, factor V leiden  Plan:     Right upper quadrant pain could potentially be biliary colic.  Since no obvious signs of acute cholecystitis at this time, no admission is warranted.  Is also comfortable going home on outpatient antibiotic regimen.  I explained to her that she is a poor candidate for any surgical intervention at this time due to her recently diagnosed pneumonia, as well as her strong history of DVT PEs due to A. fib and factor V Leiden.  I explained to her that hopefully the antibiotics and continued symptomatic management of her biliary colic will be enough to keep the pain and symptoms under control.  I stated that if the pain persist despite pain control, we will  need to discuss risks of surgery in more detail, and also potentially think about IVC filter placement to minimize any clotting issues perioperatively.  Outpatient cholecystostomy tube placement may also need to be considered.  I left her my contact information and asked her to follow-up with me regarding any recurrent issues.  Otherwise I deferred further care for her pneumonia and antibiotic regimen along with her medical communities back to Dr. Ouida Sills.  Patient verbalized understanding.  Plan discussed with ER provider Dr. Cinda Quest he is also in agreement.

## 2018-08-05 ENCOUNTER — Other Ambulatory Visit: Payer: Self-pay

## 2018-08-05 ENCOUNTER — Inpatient Hospital Stay
Admission: EM | Admit: 2018-08-05 | Discharge: 2018-08-08 | DRG: 377 | Disposition: A | Discharge status: Home / Self Care | Payer: Medicare HMO | Attending: Internal Medicine | Admitting: Internal Medicine

## 2018-08-05 ENCOUNTER — Inpatient Hospital Stay: Payer: Medicare HMO | Admitting: Anesthesiology

## 2018-08-05 ENCOUNTER — Encounter
Admission: EM | Disposition: A | Discharge status: Home / Self Care | Payer: Self-pay | Source: Home / Self Care | Attending: Internal Medicine

## 2018-08-05 ENCOUNTER — Emergency Department: Payer: Medicare HMO

## 2018-08-05 ENCOUNTER — Encounter: Payer: Self-pay | Admitting: Emergency Medicine

## 2018-08-05 DIAGNOSIS — K921 Melena: Secondary | ICD-10-CM

## 2018-08-05 DIAGNOSIS — K5730 Diverticulosis of large intestine without perforation or abscess without bleeding: Secondary | ICD-10-CM | POA: Diagnosis present

## 2018-08-05 DIAGNOSIS — Z79811 Long term (current) use of aromatase inhibitors: Secondary | ICD-10-CM

## 2018-08-05 DIAGNOSIS — E876 Hypokalemia: Secondary | ICD-10-CM | POA: Diagnosis present

## 2018-08-05 DIAGNOSIS — R1013 Epigastric pain: Secondary | ICD-10-CM | POA: Diagnosis present

## 2018-08-05 DIAGNOSIS — E7800 Pure hypercholesterolemia, unspecified: Secondary | ICD-10-CM | POA: Diagnosis present

## 2018-08-05 DIAGNOSIS — K2971 Gastritis, unspecified, with bleeding: Principal | ICD-10-CM | POA: Diagnosis present

## 2018-08-05 DIAGNOSIS — Z800 Family history of malignant neoplasm of digestive organs: Secondary | ICD-10-CM

## 2018-08-05 DIAGNOSIS — D6851 Activated protein C resistance: Secondary | ICD-10-CM | POA: Diagnosis present

## 2018-08-05 DIAGNOSIS — Z888 Allergy status to other drugs, medicaments and biological substances status: Secondary | ICD-10-CM

## 2018-08-05 DIAGNOSIS — Z20828 Contact with and (suspected) exposure to other viral communicable diseases: Secondary | ICD-10-CM | POA: Diagnosis present

## 2018-08-05 DIAGNOSIS — I700 Atherosclerosis of aorta: Secondary | ICD-10-CM | POA: Diagnosis present

## 2018-08-05 DIAGNOSIS — Z7982 Long term (current) use of aspirin: Secondary | ICD-10-CM

## 2018-08-05 DIAGNOSIS — I708 Atherosclerosis of other arteries: Secondary | ICD-10-CM | POA: Diagnosis present

## 2018-08-05 DIAGNOSIS — Z86711 Personal history of pulmonary embolism: Secondary | ICD-10-CM

## 2018-08-05 DIAGNOSIS — I4891 Unspecified atrial fibrillation: Secondary | ICD-10-CM | POA: Diagnosis present

## 2018-08-05 DIAGNOSIS — Z170 Estrogen receptor positive status [ER+]: Secondary | ICD-10-CM

## 2018-08-05 DIAGNOSIS — J189 Pneumonia, unspecified organism: Secondary | ICD-10-CM | POA: Diagnosis present

## 2018-08-05 DIAGNOSIS — M48061 Spinal stenosis, lumbar region without neurogenic claudication: Secondary | ICD-10-CM | POA: Diagnosis present

## 2018-08-05 DIAGNOSIS — F329 Major depressive disorder, single episode, unspecified: Secondary | ICD-10-CM | POA: Diagnosis present

## 2018-08-05 DIAGNOSIS — N3001 Acute cystitis with hematuria: Secondary | ICD-10-CM | POA: Diagnosis present

## 2018-08-05 DIAGNOSIS — E871 Hypo-osmolality and hyponatremia: Secondary | ICD-10-CM | POA: Diagnosis present

## 2018-08-05 DIAGNOSIS — C50512 Malignant neoplasm of lower-outer quadrant of left female breast: Secondary | ICD-10-CM | POA: Diagnosis present

## 2018-08-05 DIAGNOSIS — Z85828 Personal history of other malignant neoplasm of skin: Secondary | ICD-10-CM | POA: Diagnosis not present

## 2018-08-05 DIAGNOSIS — Z7901 Long term (current) use of anticoagulants: Secondary | ICD-10-CM

## 2018-08-05 DIAGNOSIS — I10 Essential (primary) hypertension: Secondary | ICD-10-CM | POA: Diagnosis present

## 2018-08-05 DIAGNOSIS — F419 Anxiety disorder, unspecified: Secondary | ICD-10-CM | POA: Diagnosis present

## 2018-08-05 DIAGNOSIS — Z923 Personal history of irradiation: Secondary | ICD-10-CM

## 2018-08-05 DIAGNOSIS — Z9221 Personal history of antineoplastic chemotherapy: Secondary | ICD-10-CM

## 2018-08-05 DIAGNOSIS — Z86718 Personal history of other venous thrombosis and embolism: Secondary | ICD-10-CM | POA: Diagnosis not present

## 2018-08-05 DIAGNOSIS — Z66 Do not resuscitate: Secondary | ICD-10-CM | POA: Diagnosis present

## 2018-08-05 DIAGNOSIS — K922 Gastrointestinal hemorrhage, unspecified: Secondary | ICD-10-CM

## 2018-08-05 DIAGNOSIS — K8020 Calculus of gallbladder without cholecystitis without obstruction: Secondary | ICD-10-CM | POA: Diagnosis present

## 2018-08-05 DIAGNOSIS — Z79899 Other long term (current) drug therapy: Secondary | ICD-10-CM

## 2018-08-05 DIAGNOSIS — Z87891 Personal history of nicotine dependence: Secondary | ICD-10-CM

## 2018-08-05 HISTORY — DX: Acute embolism and thrombosis of unspecified deep veins of unspecified lower extremity: I82.409

## 2018-08-05 HISTORY — PX: ESOPHAGOGASTRODUODENOSCOPY (EGD) WITH PROPOFOL: SHX5813

## 2018-08-05 HISTORY — DX: Unspecified atrial fibrillation: I48.91

## 2018-08-05 LAB — CBC WITH DIFFERENTIAL/PLATELET
Abs Immature Granulocytes: 0.05 10*3/uL (ref 0.00–0.07)
Basophils Absolute: 0.1 10*3/uL (ref 0.0–0.1)
Basophils Relative: 0 %
Eosinophils Absolute: 0.1 10*3/uL (ref 0.0–0.5)
Eosinophils Relative: 1 %
HCT: 40.6 % (ref 36.0–46.0)
Hemoglobin: 13.9 g/dL (ref 12.0–15.0)
Immature Granulocytes: 0 %
Lymphocytes Relative: 7 %
Lymphs Abs: 0.9 10*3/uL (ref 0.7–4.0)
MCH: 28.4 pg (ref 26.0–34.0)
MCHC: 34.2 g/dL (ref 30.0–36.0)
MCV: 83 fL (ref 80.0–100.0)
Monocytes Absolute: 0.6 10*3/uL (ref 0.1–1.0)
Monocytes Relative: 5 %
Neutro Abs: 11.3 10*3/uL — ABNORMAL HIGH (ref 1.7–7.7)
Neutrophils Relative %: 87 %
Platelets: 214 10*3/uL (ref 150–400)
RBC: 4.89 MIL/uL (ref 3.87–5.11)
RDW: 12.7 % (ref 11.5–15.5)
WBC: 13 10*3/uL — ABNORMAL HIGH (ref 4.0–10.5)
nRBC: 0 % (ref 0.0–0.2)

## 2018-08-05 LAB — COMPREHENSIVE METABOLIC PANEL
ALT: 77 U/L — ABNORMAL HIGH (ref 0–44)
AST: 55 U/L — ABNORMAL HIGH (ref 15–41)
Albumin: 3.9 g/dL (ref 3.5–5.0)
Alkaline Phosphatase: 84 U/L (ref 38–126)
Anion gap: 11 (ref 5–15)
BUN: 11 mg/dL (ref 8–23)
CO2: 26 mmol/L (ref 22–32)
Calcium: 8.8 mg/dL — ABNORMAL LOW (ref 8.9–10.3)
Chloride: 92 mmol/L — ABNORMAL LOW (ref 98–111)
Creatinine, Ser: 0.73 mg/dL (ref 0.44–1.00)
GFR calc Af Amer: >60 mL/min (ref >60)
GFR calc non Af Amer: >60 mL/min (ref >60)
Glucose, Bld: 185 mg/dL — ABNORMAL HIGH (ref 70–99)
Potassium: 3.1 mmol/L — ABNORMAL LOW (ref 3.5–5.1)
Sodium: 129 mmol/L — ABNORMAL LOW (ref 135–145)
Total Bilirubin: 1.1 mg/dL (ref 0.3–1.2)
Total Protein: 7.3 g/dL (ref 6.5–8.1)

## 2018-08-05 LAB — PROTIME-INR
INR: 2 — ABNORMAL HIGH (ref 0.8–1.2)
Prothrombin Time: 22.7 seconds — ABNORMAL HIGH (ref 11.4–15.2)

## 2018-08-05 LAB — SARS CORONAVIRUS 2 BY RT PCR (HOSPITAL ORDER, PERFORMED IN ~~LOC~~ HOSPITAL LAB): SARS Coronavirus 2: NEGATIVE

## 2018-08-05 LAB — TYPE AND SCREEN
ABO/RH(D): A POS
Antibody Screen: NEGATIVE

## 2018-08-05 LAB — LIPASE, BLOOD: Lipase: 58 U/L — ABNORMAL HIGH (ref 11–51)

## 2018-08-05 LAB — LACTIC ACID, PLASMA
Lactic Acid, Venous: 2.3 mmol/L (ref 0.5–1.9)
Lactic Acid, Venous: 0.9 mmol/L (ref 0.5–1.9)

## 2018-08-05 LAB — TROPONIN I: Troponin I: <0.03 ng/mL (ref <0.03)

## 2018-08-05 LAB — HEMOGLOBIN: Hemoglobin: 13.6 g/dL (ref 12.0–15.0)

## 2018-08-05 IMAGING — DX PORTABLE CHEST - 1 VIEW
1 series · 1 of 1 positions shown · non-contrast
Comparison: CT Abdomen and Pelvis [DATE] and earlier.

CLINICAL DATA: 72-year-old female with possible pneumonia in the
right costophrenic angle on CT Abdomen and Pelvis yesterday.
Persistent abdominal and back pain.

EXAM:
PORTABLE CHEST 1 VIEW

[chest ap]
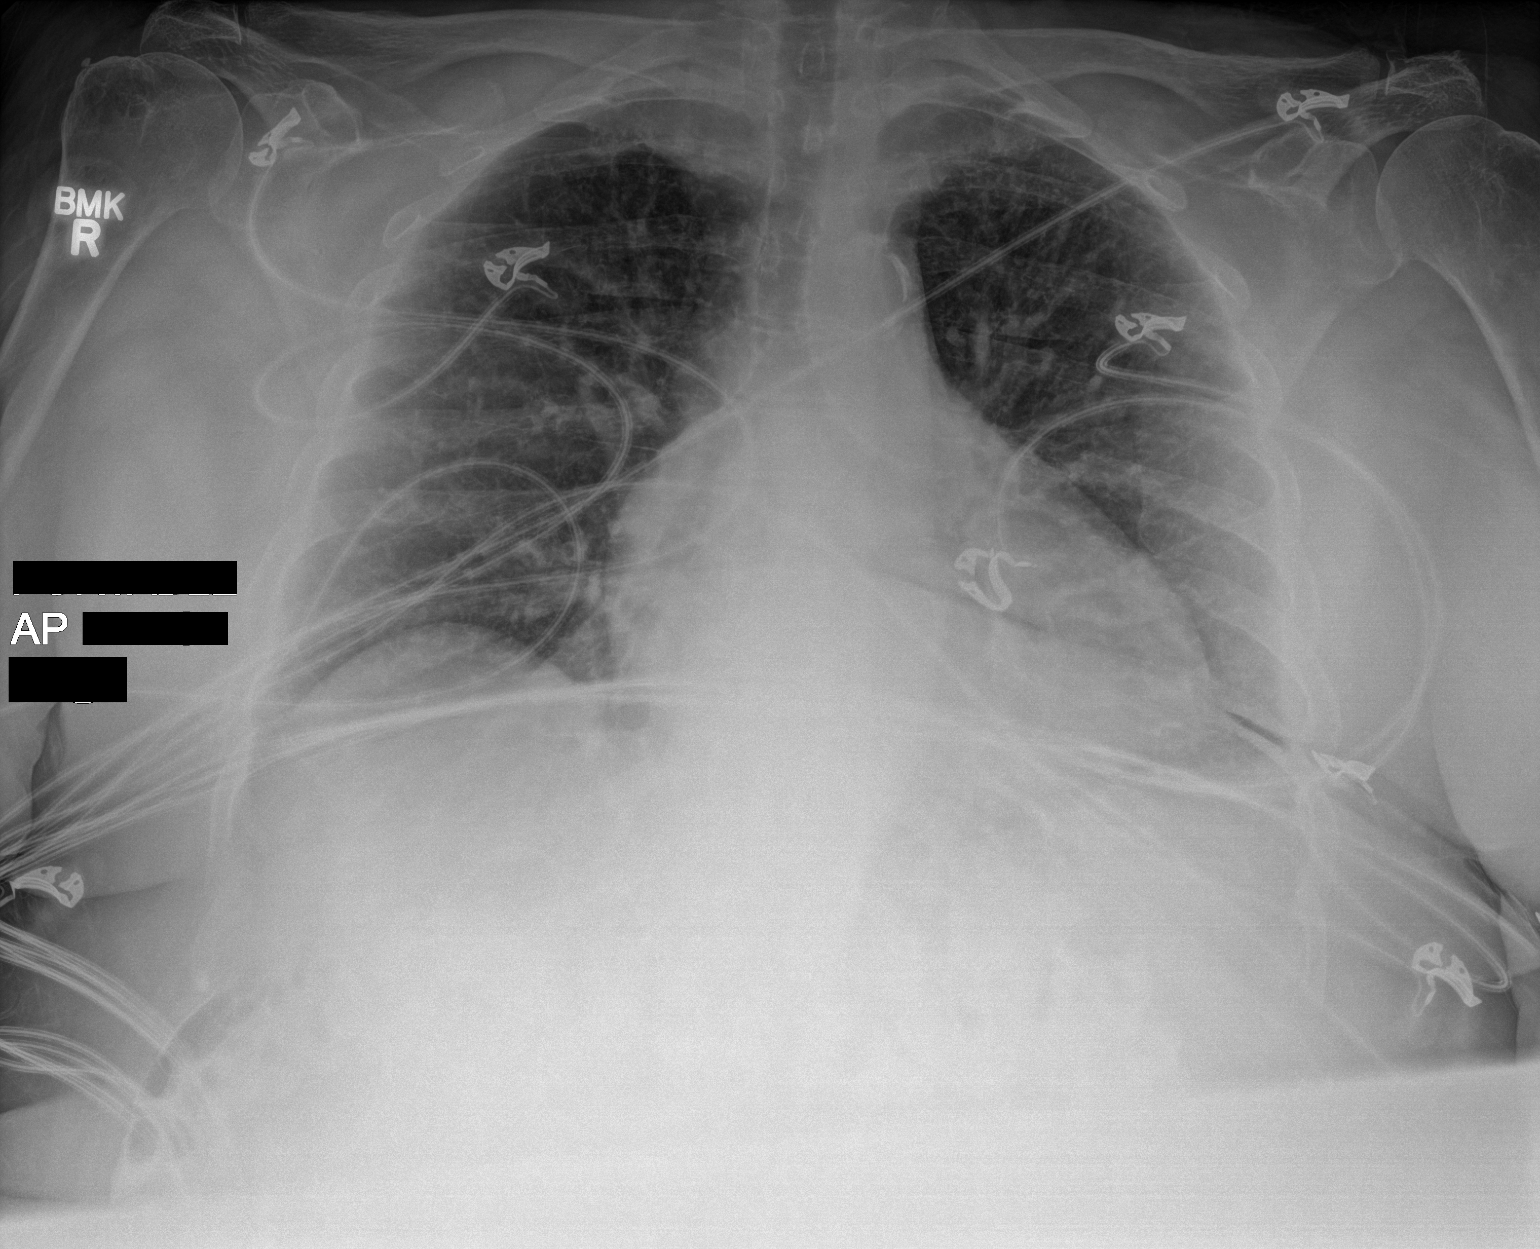

[1 of 1 positions shown; findings below may reference images not displayed]

FINDINGS: Portable AP upright view at [6F] hours. Stable mild cardiomegaly.
Other mediastinal contours are within normal limits. Visualized
tracheal air column is within normal limits. Allowing for portable
technique the lungs are clear. No pneumothorax or pleural effusion.

In the visible upper abdomen there is oral contrast in nondilated
transverse colon. No pneumoperitoneum identified.
IMPRESSION: 1. No acute cardiopulmonary abnormality by portable x-ray. Suspect
right costophrenic angle opacity seen yesterday was atelectasis.
2. Oral contrast from yesterday in nondilated transverse colon.

## 2018-08-05 SURGERY — ESOPHAGOGASTRODUODENOSCOPY (EGD) WITH PROPOFOL
Anesthesia: General

## 2018-08-05 MED ORDER — ONDANSETRON HCL 4 MG PO TABS: 4.0000 mg | ORAL_TABLET | Freq: Four times a day (QID) | ORAL | Status: DC | PRN

## 2018-08-05 MED ORDER — ONDANSETRON HCL 4 MG/2ML IJ SOLN: 4.0000 mg | Freq: Four times a day (QID) | INTRAMUSCULAR | Status: DC | PRN

## 2018-08-05 MED ORDER — PROPOFOL 500 MG/50ML IV EMUL
INTRAVENOUS | Status: DC | PRN
  Administered 2018-08-05: 150 ug/kg/min via INTRAVENOUS

## 2018-08-05 MED ORDER — POTASSIUM CHLORIDE IN NACL 20-0.9 MEQ/L-% IV SOLN
INTRAVENOUS | Status: DC
  Administered 2018-08-05 – 2018-08-07 (×5): via INTRAVENOUS
  Filled 2018-08-05 (×7): qty 1000

## 2018-08-05 MED ORDER — SODIUM CHLORIDE 0.9 % IV SOLN
INTRAVENOUS | Status: DC | PRN
  Administered 2018-08-05: 14:00:00 via INTRAVENOUS

## 2018-08-05 MED ORDER — LETROZOLE 2.5 MG PO TABS
2.5000 mg | ORAL_TABLET | Freq: Every day | ORAL | Status: DC
  Administered 2018-08-06 – 2018-08-08 (×3): 2.5 mg via ORAL
  Filled 2018-08-05 (×4): qty 1

## 2018-08-05 MED ORDER — DILTIAZEM HCL 25 MG/5ML IV SOLN
10.0000 mg | Freq: Once | INTRAVENOUS | Status: AC
  Administered 2018-08-05: 10 mg via INTRAVENOUS
  Filled 2018-08-05: qty 5

## 2018-08-05 MED ORDER — DILTIAZEM HCL ER COATED BEADS 120 MG PO CP24
240.0000 mg | ORAL_CAPSULE | Freq: Every day | ORAL | Status: DC
  Administered 2018-08-06 – 2018-08-08 (×3): 240 mg via ORAL
  Filled 2018-08-05 (×3): qty 2

## 2018-08-05 MED ORDER — BUPROPION HCL ER (SR) 150 MG PO TB12
150.0000 mg | ORAL_TABLET | Freq: Every day | ORAL | Status: DC
  Administered 2018-08-06 – 2018-08-08 (×3): 150 mg via ORAL
  Filled 2018-08-05 (×3): qty 1

## 2018-08-05 MED ORDER — BUSPIRONE HCL 10 MG PO TABS
10.0000 mg | ORAL_TABLET | Freq: Every day | ORAL | Status: DC
  Administered 2018-08-06 – 2018-08-08 (×3): 10 mg via ORAL
  Filled 2018-08-05 (×4): qty 1

## 2018-08-05 MED ORDER — ACETAMINOPHEN 325 MG PO TABS
650.0000 mg | ORAL_TABLET | Freq: Four times a day (QID) | ORAL | Status: DC | PRN
  Administered 2018-08-05 – 2018-08-06 (×2): 650 mg via ORAL
  Filled 2018-08-05 (×3): qty 2

## 2018-08-05 MED ORDER — PRAVASTATIN SODIUM 40 MG PO TABS
40.0000 mg | ORAL_TABLET | Freq: Every day | ORAL | Status: DC
  Administered 2018-08-06 – 2018-08-08 (×3): 40 mg via ORAL
  Filled 2018-08-05 (×3): qty 1

## 2018-08-05 MED ORDER — SODIUM CHLORIDE 0.9 % IV BOLUS
1000.0000 mL | Freq: Once | INTRAVENOUS | Status: AC
  Administered 2018-08-05: 1000 mL via INTRAVENOUS

## 2018-08-05 MED ORDER — CITALOPRAM HYDROBROMIDE 20 MG PO TABS
20.0000 mg | ORAL_TABLET | Freq: Every day | ORAL | Status: DC
  Administered 2018-08-06 – 2018-08-08 (×3): 20 mg via ORAL
  Filled 2018-08-05 (×4): qty 1

## 2018-08-05 MED ORDER — PANTOPRAZOLE SODIUM 40 MG IV SOLR
40.0000 mg | Freq: Once | INTRAVENOUS | Status: AC
  Administered 2018-08-05: 40 mg via INTRAVENOUS
  Filled 2018-08-05: qty 40

## 2018-08-05 MED ORDER — LORAZEPAM 1 MG PO TABS: 1.0000 mg | ORAL_TABLET | Freq: Two times a day (BID) | ORAL | Status: DC | PRN

## 2018-08-05 MED ORDER — AZITHROMYCIN 250 MG PO TABS
250.0000 mg | ORAL_TABLET | Freq: Every day | ORAL | Status: DC
  Administered 2018-08-06 – 2018-08-08 (×3): 250 mg via ORAL
  Filled 2018-08-05 (×3): qty 1

## 2018-08-05 MED ORDER — LIDOCAINE HCL (CARDIAC) PF 100 MG/5ML IV SOSY
PREFILLED_SYRINGE | INTRAVENOUS | Status: DC | PRN
  Administered 2018-08-05: 100 mg via INTRAVENOUS

## 2018-08-05 MED ORDER — DILTIAZEM HCL 25 MG/5ML IV SOLN: 10.0000 mg | INTRAVENOUS | Status: DC | PRN

## 2018-08-05 MED ORDER — SODIUM CHLORIDE 0.9 % IV SOLN
2.0000 g | INTRAVENOUS | Status: DC
  Administered 2018-08-06 – 2018-08-07 (×2): 2 g via INTRAVENOUS
  Filled 2018-08-05 (×2): qty 2

## 2018-08-05 MED ORDER — SODIUM CHLORIDE 0.9 % IV SOLN
1.0000 g | Freq: Once | INTRAVENOUS | Status: AC
  Administered 2018-08-05: 1 g via INTRAVENOUS
  Filled 2018-08-05: qty 10

## 2018-08-05 MED ORDER — ACETAMINOPHEN 650 MG RE SUPP: 650.0000 mg | Freq: Four times a day (QID) | RECTAL | Status: DC | PRN

## 2018-08-05 MED ORDER — PHYTONADIONE 5 MG PO TABS
5.0000 mg | ORAL_TABLET | Freq: Once | ORAL | Status: AC
  Administered 2018-08-05: 5 mg via ORAL
  Filled 2018-08-05: qty 1

## 2018-08-05 MED ORDER — PROPOFOL 10 MG/ML IV BOLUS
INTRAVENOUS | Status: DC | PRN
  Administered 2018-08-05: 60 mg via INTRAVENOUS

## 2018-08-05 MED ORDER — DILTIAZEM HCL 25 MG/5ML IV SOLN
INTRAVENOUS | Status: AC
  Filled 2018-08-05: qty 5

## 2018-08-05 MED ORDER — MAGNESIUM SULFATE 2 GM/50ML IV SOLN
2.0000 g | Freq: Once | INTRAVENOUS | Status: AC
  Administered 2018-08-05: 2 g via INTRAVENOUS
  Filled 2018-08-05: qty 50

## 2018-08-05 MED ORDER — SODIUM CHLORIDE 0.9 % IV SOLN
8.0000 mg/h | INTRAVENOUS | Status: DC
  Administered 2018-08-05 – 2018-08-06 (×3): 8 mg/h via INTRAVENOUS
  Filled 2018-08-05 (×3): qty 80

## 2018-08-05 MED ORDER — HYDROCODONE-ACETAMINOPHEN 5-325 MG PO TABS
1.0000 | ORAL_TABLET | Freq: Four times a day (QID) | ORAL | Status: DC | PRN
  Administered 2018-08-05 – 2018-08-08 (×4): 1 via ORAL
  Filled 2018-08-05 (×4): qty 1

## 2018-08-05 MED ORDER — SODIUM CHLORIDE 0.9 % IV SOLN
500.0000 mg | Freq: Once | INTRAVENOUS | Status: AC
  Administered 2018-08-05: 500 mg via INTRAVENOUS
  Filled 2018-08-05: qty 500

## 2018-08-05 NOTE — ED Provider Notes (Signed)
[facility] Emergency Department Provider Note   ____________________________________________   First MD Initiated Contact with Patient 08/05/18 [phone]     (approximate)  I have reviewed the triage vital signs and the nursing notes.   HISTORY  Chief Complaint Abdominal Pain    HPI Stacy Reese is a 73 y.o. female who returns to the ED from home with persistent epigastric/right upper quadrant abdominal pain, nausea and vomiting.  Patient was seen in the ED yesterday for same.  Had ultrasound, CT scan and surgery consult and diagnosed with cholelithiasis.  Her understanding is that she would be a poor candidate for surgery.  Patient does take warfarin for atrial fibrillation.  States pain worsened overnight and she had a stool that appeared black and tarry.  No history of GI bleed.  Denies fever, cough, chest pain, shortness of breath, dysuria, diarrhea.  Denies recent travel, trauma or exposure to persons diagnosed with coronavirus.        Past Medical History:  Diagnosis Date  . 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 Hypercholesterolemia   . Hypertension   . Personal history of chemotherapy    1 round  . Personal history of radiation therapy   . Skin cancer    squamous cell  . Squamous cell carcinoma of skin    status post exicision    Patient Active Problem List   Diagnosis Date Noted  . Heterozygous factor V Leiden mutation ([place]) 06/02/2016  . Pulmonary embolism ([place]) 12/04/2015  . Primary cancer of lower outer quadrant of left female breast ([place]) 09/22/2014    Past Surgical History:  Procedure Laterality Date  . BREAST BIOPSY Left 09/04/2013   negative stereo bx  . BREAST BIOPSY Left 08/25/2013   postive [place] core bx  . BREAST LUMPECTOMY Left 08/2017  . BREAST LUMPECTOMY W/ NEEDLE LOCALIZATION Left 2015  . GANGLION CYST EXCISION      Prior to Admission medications   Medication Sig Start  Date End Date Taking? Authorizing Provider  aspirin EC 81 MG tablet Take 81 mg by mouth daily.     [provider]  buPROPion (WELLBUTRIN SR) 150 MG 12 hr tablet Take 150 mg by mouth daily.  02/12/14   [provider]  busPIRone (BUSPAR) 10 MG tablet Take 10 mg by mouth daily.     [provider]  cephALEXin (KEFLEX) 500 MG capsule Take 1 capsule (500 mg total) by mouth 4 (four) times daily for 10 days. 08/04/18 08/14/18  Nena Polio, MD  citalopram (CELEXA) 20 MG tablet Take 20 mg by mouth daily.     [provider]  diltiazem (TIAZAC) 240 MG 24 hr capsule Take 240 mg by mouth daily.  07/13/14   [provider]  esomeprazole (NEXIUM) 40 MG capsule Take 1 capsule (40 mg total) by mouth daily. 08/04/18 08/04/19  Nena Polio, MD  HYDROcodone-acetaminophen (NORCO/VICODIN) 5-325 MG tablet Take 1 tablet by mouth every 6 (six) hours as needed for moderate pain. 08/04/18   Nena Polio, MD  letrozole [facility]) 2.5 MG tablet TAKE ONE TABLET BY MOUTH DAILY 07/27/18   Lloyd Huger, MD  LORazepam (ATIVAN) 1 MG tablet Take 1 mg by mouth as needed.     [provider]  pravastatin (PRAVACHOL) 40 MG tablet Take 40 mg by mouth daily.     [provider]  triamterene-hydrochlorothiazide (MAXZIDE-25) 37.5-25 MG per tablet Take  1 tablet by mouth daily.     [provider]  warfarin (COUMADIN) 2 MG tablet Take 2 mg by mouth daily.  12/08/17 12/08/18  [provider]    Allergies Metoprolol and Other  Family History  Problem Relation Age of Onset  . Cancer Father        colon  . Breast cancer Neg Hx     Social History Social History   Tobacco Use  . Smoking status: Former Research scientist (life sciences)  . Smokeless tobacco: Never Used  Substance Use Topics  . Alcohol use: Yes    Comment: social  . Drug use: No    Review of Systems  Constitutional: No fever/chills Eyes: No visual changes. ENT: No sore throat. Cardiovascular: Denies  chest pain. Respiratory: Denies shortness of breath. Gastrointestinal: Positive for abdominal pain, nausea and vomiting.  No diarrhea.  No constipation.  Positive for black stool. Genitourinary: Negative for dysuria. Musculoskeletal: Negative for back pain. Skin: Negative for rash. Neurological: Negative for headaches, focal weakness or numbness.   ____________________________________________   PHYSICAL EXAM:  VITAL SIGNS: ED Triage Vitals  Enc Vitals Group     BP 08/05/18 0521 (!) 139/94     Pulse Rate 08/05/18 0521 (!) 135     Resp 08/05/18 0521 18     Temp 08/05/18 0521 98.3 F (36.8 C)     Temp Source 08/05/18 0521 Oral     SpO2 08/05/18 0521 98 %     Weight 08/05/18 0513 200 lb (90.7 kg)     Height 08/05/18 0513 5\' 5"  (1.651 m)     Head Circumference --      Peak Flow --      Pain Score 08/05/18 0513 7     Pain Loc --      Pain Edu? --      Excl. in [place]? --     Constitutional: Alert and oriented. Well appearing and in mild acute distress. Eyes: Conjunctivae are normal. PERRL. EOMI. Head: Atraumatic. Nose: No congestion/rhinnorhea. Mouth/Throat: Mucous membranes are moist.  Oropharynx non-erythematous. Neck: No stridor.   Cardiovascular: Tachycardic rate, irregular rhythm. Grossly normal heart sounds.  Good peripheral circulation. Respiratory: Normal respiratory effort.  No retractions. Lungs CTAB. Gastrointestinal: Soft and mildly tender to palpation epigastrium without rebound or guarding. No distention. No abdominal bruits. No CVA tenderness. Musculoskeletal: No lower extremity tenderness nor edema.  No joint effusions. Neurologic:  Normal speech and language. No gross focal neurologic deficits are appreciated. No gait instability. Skin:  Skin is warm, dry and intact. No rash noted. Psychiatric: Mood and affect are normal. Speech and behavior are normal.  ____________________________________________   LABS (all labs ordered are listed, but only abnormal  results are displayed)  Labs Reviewed  CBC WITH DIFFERENTIAL/PLATELET - Abnormal; Notable for the following components:      Result Value   WBC 13.0 (*)    Neutro Abs 11.3 (*)    All other components within normal limits  COMPREHENSIVE METABOLIC PANEL - Abnormal; Notable for the following components:   Sodium 129 (*)    Potassium 3.1 (*)    Chloride 92 (*)    Glucose, Bld 185 (*)    Calcium 8.8 (*)    AST 55 (*)    ALT 77 (*)    All other components within normal limits  LIPASE, BLOOD - Abnormal; Notable for the following components:   Lipase 58 (*)    All other components within normal limits  PROTIME-INR - Abnormal; Notable  for the following components:   Prothrombin Time 22.7 (*)    INR 2.0 (*)    All other components within normal limits  LACTIC ACID, PLASMA - Abnormal; Notable for the following components:   Lactic Acid, Venous 2.3 (*)    All other components within normal limits  SARS CORONAVIRUS 2 (HOSPITAL ORDER, Finger LAB)  CULTURE, BLOOD (ROUTINE X 2)  CULTURE, BLOOD (ROUTINE X 2)  TROPONIN I  LACTIC ACID, PLASMA  TYPE AND SCREEN   ____________________________________________  EKG  ED ECG REPORT I, Luzelena Heeg J, the attending physician, personally viewed and interpreted this ECG.   Date: 08/05/2018  EKG Time: 0520  Rate: 121  Rhythm: atrial fibrillation, rate 121  Axis: Normal  Intervals: QTc 501  ST&T Change: Nonspecific  ____________________________________________  RADIOLOGY  ED MD interpretation: Reviewed CT and ultrasound from 08/04/2018  Official radiology report(s): Ct Abdomen Pelvis W Contrast  Result Date: 08/04/2018 CLINICAL DATA:  Abdominal pain, radiating toward back EXAM: CT ABDOMEN AND PELVIS WITH CONTRAST TECHNIQUE: Multidetector CT imaging of the abdomen and pelvis was performed using the standard protocol following bolus administration of intravenous contrast. Oral contrast was also administered. CONTRAST:   160mL OMNIPAQUE IOHEXOL 300 MG/ML  SOLN COMPARISON:  None. FINDINGS: Lower chest: There is atelectatic change in each lung base. There is a small area of apparent pneumonia in the posterior segment right lower lobe. There are foci of coronary artery calcification. There is oral contrast in the distal esophagus, likely indicative of a degree of spontaneous gastroesophageal reflux. Hepatobiliary: There is hepatic steatosis. No focal liver lesion is appreciable. There appear to be gallstones, potentially intermingled with sludge, within the gallbladder. No appreciable gallbladder wall thickening evident. There is no evident biliary duct dilatation. Pancreas: There is no pancreatic mass or inflammatory focus. Spleen: No splenic lesions are evident. Adrenals/Urinary Tract: Adrenals bilaterally appear unremarkable. Kidneys bilaterally show no evident mass or hydronephrosis on either side. There is no appreciable renal or ureteral calculus on either side. Urinary bladder is midline with wall thickness within normal limits. Stomach/Bowel: There are sigmoid and descending colonic diverticula without demonstrable diverticulitis. There is no appreciable bowel wall or mesenteric thickening. No evident bowel obstruction. Terminal ileum appears normal. There is mild lipomatous infiltration of the ileocecal valve. There is no appreciable free air or portal venous air. No findings suggesting bowel ischemia. Vascular/Lymphatic: There is aortic and bilateral iliac artery atherosclerosis. There is moderate calcification at the origin of the celiac artery. The superior mesenteric artery appears patent. There is no evident adenopathy in the abdomen or pelvis. Reproductive: Uterus is anteverted.  No pelvic mass evident. Other: Appendix appears normal. There is no abscess or ascites in the abdomen or pelvis. There is a minimal ventral hernia containing only fat. There is soft tissue opacification posterior to the paraspinous muscles of  the lumbar region slightly deep to the subcutaneous regions, likely due to a degree of panniculitis. There is also subcutaneous edema in this area. Musculoskeletal: There is degenerative change in the lower thoracic and lumbar regions. There is moderate spinal stenosis at L3-4 due to disc protrusion and bony hypertrophy no intramuscular lesions are evident. IMPRESSION: 1.  Small focus of apparent pneumonia posterior right lung base. 2. Descending colonic and sigmoid diverticulosis without frank diverticulitis. No bowel obstruction. No abscess in the abdomen pelvis. Appendix appears normal. 3. No evident renal or ureteral calculus. No hydronephrosis. Urinary bladder wall thickness normal. 4. Extensive aortic and iliac artery atherosclerosis. There appears  to be hemodynamically significant narrowing at the origin of the left common iliac artery based on the degree of calcification present in this area. Foci of coronary artery calcification also noted. 5. Apparent cholelithiasis, likely intermingled with sludge. No gallbladder wall thickening evident by CT. 6.  Hepatic steatosis. 7.  Moderate spinal stenosis at L3-4, multifactorial. 8. 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 Electronically Signed   By: Lowella Grip III M.D.   On: 08/04/2018 10:21   Dg Chest Port 1 View  Result Date: 08/05/2018 CLINICAL DATA:  73 year old female with possible pneumonia in the right costophrenic angle on CT Abdomen and Pelvis yesterday. Persistent abdominal and back pain. EXAM: PORTABLE CHEST 1 VIEW COMPARISON:  CT Abdomen and Pelvis 08/04/2018 and earlier. FINDINGS: Portable AP upright view at 0548 hours. Stable mild cardiomegaly. Other mediastinal contours are within normal limits. Visualized tracheal air column is within normal limits. Allowing for portable technique the lungs are clear. No pneumothorax or pleural  effusion. In the visible upper abdomen there is oral contrast in nondilated transverse colon. No pneumoperitoneum identified. IMPRESSION: 1. No acute cardiopulmonary abnormality by portable x-ray. Suspect right costophrenic angle opacity seen yesterday was atelectasis. 2. Oral contrast from yesterday in nondilated transverse colon. Electronically Signed   By: Genevie Ann M.D.   On: 08/05/2018 06:27   US Abdomen Limited Ruq  Result Date: 08/04/2018 CLINICAL DATA:  Right upper quadrant pain and gallstones on recent CT EXAM: ULTRASOUND ABDOMEN LIMITED RIGHT UPPER QUADRANT COMPARISON:  CT from earlier in the same day. FINDINGS: Gallbladder: Gallbladder is well distended with multiple stones within. No wall thickening or pericholecystic fluid is noted. Negative sonographic Murphy's sign is noted. Common bile duct: Diameter: 6.2 mm. Liver: Increased in echogenicity consistent with fatty infiltration. No focal mass is seen. Portal vein is patent on color Doppler imaging with normal direction of blood flow towards the liver. IMPRESSION: Fatty liver. Cholelithiasis without complicating factors. Electronically Signed   By: Inez Catalina M.D.   On: 08/04/2018 11:36    ____________________________________________   PROCEDURES  Procedure(s) performed (including Critical Care):  Procedures   Rectal exam: External exam unremarkable.  Dark green stool obtained on gloved finger which is faintly heme positive.  CRITICAL CARE Performed by: Paulette Blanch   Total critical care time: 30 minutes  Critical care time was exclusive of separately billable procedures and treating other patients.  Critical care was necessary to treat or prevent imminent or life-threatening deterioration.  Critical care was time spent personally by me on the following activities: development of treatment plan with patient and/or surrogate as well as nursing, discussions with consultants, evaluation of patient's response to treatment,  examination of patient, obtaining history from patient or surrogate, ordering and performing treatments and interventions, ordering and review of laboratory studies, ordering and review of radiographic studies, pulse oximetry and re-evaluation of patient's condition. ____________________________________________   INITIAL IMPRESSION / ASSESSMENT AND PLAN / ED COURSE  As part of my medical decision making, I reviewed the following data within the [place] notes reviewed and incorporated, Labs reviewed, EKG interpreted, Old chart reviewed, Radiograph reviewed and Notes from prior ED visits     Stacy Reese was evaluated in Emergency Department on 08/05/2018 for the symptoms described in the history of present illness. She was evaluated in the context of the global COVID-19 pandemic, which necessitated consideration that the patient might  be at risk for infection with the SARS-CoV-2 virus that causes COVID-19. Institutional protocols and algorithms that pertain to the evaluation of patients at risk for COVID-19 are in a state of rapid change based on information released by regulatory bodies including the CDC and federal and state organizations. These policies and algorithms were followed during the patient's care in the ED.   73 year old female with atrial fibrillation on warfarin who returns to the ED for continued epigastric pain and now black stools. Differential diagnosis includes, but is not limited to, biliary disease (biliary colic, acute cholecystitis, cholangitis, choledocholithiasis, etc), intrathoracic causes for epigastric abdominal pain including ACS, gastritis, duodenitis, pancreatitis, small bowel or large bowel obstruction, abdominal aortic aneurysm, hernia, and ulcer(s).  Will repeat lab work and obtain chest x-ray.  Do not feel repeat imaging with ultrasound and CT scan warranted.  IV Cardizem given for atrial fibrillation with rapid ventricular rate.  Protonix  bolus and drip initiated.  Anticipate hospitalization.  Clinical Course as of Aug 04 704  Fri Aug 05, 2018  0648 Notify Dr. Lysle Pearl from general surgery who evaluated patient in the emergency department yesterday.  He will follow in consult.  Do not suspect evaded lactic acid stemming from ischemic bowel; more than likely secondary to atrial fibrillation with RVR.  Heart rate currently bouncing between 92-110 after Cardizem bolus.   [JS]  2820 Spoke with Dr. Marcille Blanco from hospitalist services to evaluate patient in the emergency department for admission.   [JS]    Clinical Course User Index [JS] Paulette Blanch, MD     ____________________________________________   FINAL CLINICAL IMPRESSION(S) / ED DIAGNOSES  Final diagnoses:  Epigastric pain  Atrial fibrillation with rapid ventricular response (HCC)  Gastrointestinal hemorrhage, unspecified gastrointestinal hemorrhage type  Calculus of gallbladder without cholecystitis without obstruction     ED Discharge Orders    None       Note:  This document was prepared using Dragon voice recognition software and may include unintentional dictation errors.   Paulette Blanch, MD 08/05/18 [phone]

## 2018-08-05 NOTE — ED Notes (Signed)
Pt ambulatory to room 19 to be placed on card monitor for EKG and further eval by EDT Mayra; care nurse Charleen Kirks notified

## 2018-08-05 NOTE — Anesthesia Postprocedure Evaluation (Signed)
Anesthesia Post Note  Patient: Stacy Reese  Procedure(s) Performed: ESOPHAGOGASTRODUODENOSCOPY (EGD) WITH PROPOFOL (N/A )  Patient location during evaluation: PACU Anesthesia Type: General Level of consciousness: awake and alert and oriented Pain management: pain level controlled Vital Signs Assessment: post-procedure vital signs reviewed and stable Respiratory status: spontaneous breathing Cardiovascular status: blood pressure returned to baseline Anesthetic complications: no     Last Vitals:  Vitals:   08/05/18 1502 08/05/18 1537  BP: (!) 147/86 (!) 150/88  Pulse:  100  Resp:  20  Temp:    SpO2:  99%    Last Pain:  Vitals:   08/05/18 1427  TempSrc:   PainSc: 0-No pain                 Emmagene Ortner

## 2018-08-05 NOTE — ED Notes (Signed)
ED TO INPATIENT HANDOFF REPORT  ED Nurse Name and Phone #:  Levada Dy [place] Name/Age/Gender Stacy Reese 73 y.o. female Room/Bed: ED26A/ED26A  Code Status   Code Status: Not on file  Home/SNF/Other Home Patient oriented to: self, place, time and situation Is this baseline? Yes   Triage Complete: Triage complete  Chief Complaint Abdominal and back pain  Triage Note Patient ambulatory to triage with steady gait, without difficulty or distress noted; pt reports seen yesterday for upper abd/back pain; pain persists with N/V and black stools; st was dx with gallbladder issues   Allergies Allergies  Allergen Reactions  . Metoprolol Shortness Of Breath  . Other Other (See Comments)    Sodium pentothal  Causes blood clots    Level of Care/Admitting Diagnosis ED Disposition    ED Disposition Condition [facility]: [place] [[id number]]  Level of Care: Telemetry [5]  Covid Evaluation: N/A  Diagnosis: Upper GI bleed [[id number]]  Admitting Physician: Loletha Grayer [[id number]]  Attending Physician: Loletha Grayer [phone]  Estimated length of stay: past midnight tomorrow  Certification:: I certify this patient will need inpatient services for at least 2 midnights  PT Class (Do Not Modify): Inpatient [101]  PT Acc Code (Do Not Modify): Private [1]       B Medical/Surgery History Past Medical History:  Diagnosis Date  . 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 Hypercholesterolemia   . Hypertension   . Personal history of chemotherapy    1 round  . Personal history of radiation therapy   . Skin cancer    squamous cell  . Squamous cell carcinoma of skin    status post exicision   Past Surgical History:  Procedure Laterality Date  . BREAST BIOPSY Left 09/04/2013   negative stereo bx  . BREAST BIOPSY Left 08/25/2013   postive [place] core bx  . BREAST LUMPECTOMY Left 08/2017  .  BREAST LUMPECTOMY W/ NEEDLE LOCALIZATION Left 2015  . GANGLION CYST EXCISION       A IV Location/Drains/Wounds Patient Lines/Drains/Airways Status   Active Line/Drains/Airways    Name:   Placement date:   Placement time:   Site:   Days:   Peripheral IV 08/05/18 Right Forearm   08/05/18    0557    Forearm   less than 1   Peripheral IV 08/05/18 Right Antecubital   08/05/18    0612    Antecubital   less than 1          Intake/Output Last 24 hours No intake or output data in the 24 hours ending 08/05/18 1941  Labs/Imaging Results for orders placed or performed during the hospital encounter of 08/05/18 (from the past 48 hour(s))  CBC with Differential     Status: Abnormal   Collection Time: 08/05/18  5:59 AM  Result Value Ref Range   WBC 13.0 (H) 4.0 - 10.5 K/uL   RBC 4.89 3.87 - 5.11 MIL/uL   Hemoglobin 13.9 12.0 - 15.0 g/dL   HCT 40.6 36.0 - 46.0 %   MCV 83.0 80.0 - 100.0 fL   MCH 28.4 26.0 - 34.0 pg   MCHC 34.2 30.0 - 36.0 g/dL   RDW 12.7 11.5 - 15.5 %   Platelets 214 150 - 400 K/uL   nRBC 0.0 0.0 - 0.2 %   Neutrophils Relative % 87 %   Neutro Abs 11.3 (H) 1.7 -  7.7 K/uL   Lymphocytes Relative 7 %   Lymphs Abs 0.9 0.7 - 4.0 K/uL   Monocytes Relative 5 %   Monocytes Absolute 0.6 0.1 - 1.0 K/uL   Eosinophils Relative 1 %   Eosinophils Absolute 0.1 0.0 - 0.5 K/uL   Basophils Relative 0 %   Basophils Absolute 0.1 0.0 - 0.1 K/uL   Immature Granulocytes 0 %   Abs Immature Granulocytes 0.05 0.00 - 0.07 K/uL    Comment: Performed at [facility], [address] [place], [place] [address]  Comprehensive metabolic panel     Status: Abnormal   Collection Time: 08/05/18  5:59 AM  Result Value Ref Range   Sodium 129 (L) 135 - 145 mmol/L   Potassium 3.1 (L) 3.5 - 5.1 mmol/L   Chloride 92 (L) 98 - 111 mmol/L   CO2 26 22 - 32 mmol/L   Glucose, Bld 185 (H) 70 - 99 mg/dL   BUN 11 8 - 23 mg/dL   Creatinine, Ser 0.73 0.44 - 1.00 mg/dL   Calcium 8.8 (L) 8.9 - 10.3 mg/dL    Total Protein 7.3 6.5 - 8.1 g/dL   Albumin 3.9 3.5 - 5.0 g/dL   AST 55 (H) 15 - 41 U/L   ALT 77 (H) 0 - 44 U/L   Alkaline Phosphatase 84 38 - 126 U/L   Total Bilirubin 1.1 0.3 - 1.2 mg/dL   GFR calc non Af Amer >60 >60 mL/min   GFR calc Af Amer >60 >60 mL/min   Anion gap 11 5 - 15    Comment: Performed at [facility], [address] [place], [place] [address]  Lipase, blood     Status: Abnormal   Collection Time: 08/05/18  5:59 AM  Result Value Ref Range   Lipase 58 (H) 11 - 51 U/L    Comment: Performed at [facility], [address] [place], [place] [address]  Troponin I - Once     Status: None   Collection Time: 08/05/18  5:59 AM  Result Value Ref Range   Troponin I <0.03 <0.03 ng/mL    Comment: Performed at [facility], [address] [place], [place] [address]  Type and screen [place]     Status: None   Collection Time: 08/05/18  5:59 AM  Result Value Ref Range   ABO/RH(D) A POS    Antibody Screen NEG    Sample Expiration      08/08/2018,2359 Performed at [facility], [address] [place], [place] [address]   Protime-INR     Status: Abnormal   Collection Time: 08/05/18  5:59 AM  Result Value Ref Range   Prothrombin Time 22.7 (H) 11.4 - 15.2 seconds   INR 2.0 (H) 0.8 - 1.2    Comment: (NOTE) INR goal varies based on device and disease states. Performed at [facility], [address] [place], [place] [address]   Lactic acid, plasma     Status: Abnormal   Collection Time: 08/05/18  5:59 AM  Result Value Ref Range   Lactic Acid, Venous 2.3 (HH) 0.5 - 1.9 mmol/L    Comment: CRITICAL RESULT CALLED TO, READ BACK BY AND VERIFIED WITH ALIVIA TAYLOR AT 8206 08/05/2018.PMF Performed at [facility], [address], [place], [place] [address]   SARS Coronavirus 2 (CEPHEID - Performed in [facility] hospital lab), [facility] Order     Status: None   Collection Time: 08/05/18  6:15  AM   Result Value Ref Range   SARS Coronavirus 2 NEGATIVE NEGATIVE    Comment: (NOTE) If result is NEGATIVE SARS-CoV-2 target nucleic acids are NOT DETECTED. The SARS-CoV-2 RNA is generally detectable in upper and lower  respiratory specimens during the acute phase of infection. The lowest  concentration of SARS-CoV-2 viral copies this assay can detect is 250  copies / mL. A negative result does not preclude SARS-CoV-2 infection  and should not be used as the sole basis for treatment or other  patient management decisions.  A negative result may occur with  improper specimen collection / handling, submission of specimen other  than nasopharyngeal swab, presence of viral mutation(s) within the  areas targeted by this assay, and inadequate number of viral copies  (<250 copies / mL). A negative result must be combined with clinical  observations, patient history, and epidemiological information. If result is POSITIVE SARS-CoV-2 target nucleic acids are DETECTED. The SARS-CoV-2 RNA is generally detectable in upper and lower  respiratory specimens dur ing the acute phase of infection.  Positive  results are indicative of active infection with SARS-CoV-2.  Clinical  correlation with patient history and other diagnostic information is  necessary to determine patient infection status.  Positive results do  not rule out bacterial infection or co-infection with other viruses. If result is PRESUMPTIVE POSTIVE SARS-CoV-2 nucleic acids MAY BE PRESENT.   A presumptive positive result was obtained on the submitted specimen  and confirmed on repeat testing.  While 2019 novel coronavirus  (SARS-CoV-2) nucleic acids may be present in the submitted sample  additional confirmatory testing may be necessary for epidemiological  and / or clinical management purposes  to differentiate between  SARS-CoV-2 and other Sarbecovirus currently known to infect humans.  If clinically indicated additional testing with  an alternate test  methodology [phone]) is advised. The SARS-CoV-2 RNA is generally  detectable in upper and lower respiratory sp ecimens during the acute  phase of infection. The expected result is Negative. Fact Sheet for Patients:  StrictlyIdeas.no Fact Sheet for Healthcare Providers: BankingDealers.co.za This test is not yet approved or cleared by the [place] FDA and has been authorized for detection and/or diagnosis of SARS-CoV-2 by FDA under an Emergency Use Authorization (EUA).  This EUA will remain in effect (meaning this test can be used) for the duration of the COVID-19 declaration under Section 564(b)(1) of the Act, 21 U.S.C. section 360bbb-3(b)(1), unless the authorization is terminated or revoked sooner. Performed at [facility], [place], [place] [address]    Ct Abdomen Pelvis W Contrast  Result Date: 08/04/2018 CLINICAL DATA:  Abdominal pain, radiating toward back EXAM: CT ABDOMEN AND PELVIS WITH CONTRAST TECHNIQUE: Multidetector CT imaging of the abdomen and pelvis was performed using the standard protocol following bolus administration of intravenous contrast. Oral contrast was also administered. CONTRAST:  173mL OMNIPAQUE IOHEXOL 300 MG/ML  SOLN COMPARISON:  None. FINDINGS: Lower chest: There is atelectatic change in each lung base. There is a small area of apparent pneumonia in the posterior segment right lower lobe. There are foci of coronary artery calcification. There is oral contrast in the distal esophagus, likely indicative of a degree of spontaneous gastroesophageal reflux. Hepatobiliary: There is hepatic steatosis. No focal liver lesion is appreciable. There appear to be gallstones, potentially intermingled with sludge, within the gallbladder. No appreciable gallbladder wall thickening evident. There is no evident biliary duct dilatation. Pancreas: There is no pancreatic mass or  inflammatory focus. Spleen: No  splenic lesions are evident. Adrenals/Urinary Tract: Adrenals bilaterally appear unremarkable. Kidneys bilaterally show no evident mass or hydronephrosis on either side. There is no appreciable renal or ureteral calculus on either side. Urinary bladder is midline with wall thickness within normal limits. Stomach/Bowel: There are sigmoid and descending colonic diverticula without demonstrable diverticulitis. There is no appreciable bowel wall or mesenteric thickening. No evident bowel obstruction. Terminal ileum appears normal. There is mild lipomatous infiltration of the ileocecal valve. There is no appreciable free air or portal venous air. No findings suggesting bowel ischemia. Vascular/Lymphatic: There is aortic and bilateral iliac artery atherosclerosis. There is moderate calcification at the origin of the celiac artery. The superior mesenteric artery appears patent. There is no evident adenopathy in the abdomen or pelvis. Reproductive: Uterus is anteverted.  No pelvic mass evident. Other: Appendix appears normal. There is no abscess or ascites in the abdomen or pelvis. There is a minimal ventral hernia containing only fat. There is soft tissue opacification posterior to the paraspinous muscles of the lumbar region slightly deep to the subcutaneous regions, likely due to a degree of panniculitis. There is also subcutaneous edema in this area. Musculoskeletal: There is degenerative change in the lower thoracic and lumbar regions. There is moderate spinal stenosis at L3-4 due to disc protrusion and bony hypertrophy no intramuscular lesions are evident. IMPRESSION: 1.  Small focus of apparent pneumonia posterior right lung base. 2. Descending colonic and sigmoid diverticulosis without frank diverticulitis. No bowel obstruction. No abscess in the abdomen pelvis. Appendix appears normal. 3. No evident renal or ureteral calculus. No hydronephrosis. Urinary bladder wall thickness normal.  4. Extensive aortic and iliac artery atherosclerosis. There appears to be hemodynamically significant narrowing at the origin of the left common iliac artery based on the degree of calcification present in this area. Foci of coronary artery calcification also noted. 5. Apparent cholelithiasis, likely intermingled with sludge. No gallbladder wall thickening evident by CT. 6.  Hepatic steatosis. 7.  Moderate spinal stenosis at L3-4, multifactorial. 8. 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 Electronically Signed   By: Lowella Grip III M.D.   On: 08/04/2018 10:21   Dg Chest Port 1 View  Result Date: 08/05/2018 CLINICAL DATA:  73 year old female with possible pneumonia in the right costophrenic angle on CT Abdomen and Pelvis yesterday. Persistent abdominal and back pain. EXAM: PORTABLE CHEST 1 VIEW COMPARISON:  CT Abdomen and Pelvis 08/04/2018 and earlier. FINDINGS: Portable AP upright view at 0548 hours. Stable mild cardiomegaly. Other mediastinal contours are within normal limits. Visualized tracheal air column is within normal limits. Allowing for portable technique the lungs are clear. No pneumothorax or pleural effusion. In the visible upper abdomen there is oral contrast in nondilated transverse colon. No pneumoperitoneum identified. IMPRESSION: 1. No acute cardiopulmonary abnormality by portable x-ray. Suspect right costophrenic angle opacity seen yesterday was atelectasis. 2. Oral contrast from yesterday in nondilated transverse colon. Electronically Signed   By: Genevie Ann M.D.   On: 08/05/2018 06:27   US Abdomen Limited Ruq  Result Date: 08/04/2018 CLINICAL DATA:  Right upper quadrant pain and gallstones on recent CT EXAM: ULTRASOUND ABDOMEN LIMITED RIGHT UPPER QUADRANT COMPARISON:  CT from earlier in the same day. FINDINGS: Gallbladder: Gallbladder is well distended with multiple stones within.  No wall thickening or pericholecystic fluid is noted. Negative sonographic Murphy's sign is noted. Common bile duct: Diameter: 6.2 mm. Liver: Increased in echogenicity  consistent with fatty infiltration. No focal mass is seen. Portal vein is patent on color Doppler imaging with normal direction of blood flow towards the liver. IMPRESSION: Fatty liver. Cholelithiasis without complicating factors. Electronically Signed   By: Inez Catalina M.D.   On: 08/04/2018 11:36    Pending Labs Unresulted Labs (From admission, onward)    Start     Ordered   08/05/18 0720  Urine Culture  Once,   STAT    Question:  Patient immune status  Answer:  Normal   08/05/18 0719   08/05/18 0547  Lactic acid, plasma  Now then every 2 hours,   STAT     08/05/18 0546   08/05/18 0547  Culture, blood (routine x 2)  BLOOD CULTURE X 2,   STAT     08/05/18 0547          Vitals/Pain Today's Vitals   08/05/18 0513 08/05/18 0521 08/05/18 0530  BP:  (!) 139/94 (!) 157/100  Pulse:  (!) 135 (!) 122  Resp:  18 15  Temp:  98.3 F (36.8 C)   TempSrc:  Oral   SpO2:  98% 96%  Weight: 90.7 kg    Height: 5\' 5"  (1.651 m)    PainSc: 7       Isolation Precautions No active isolations  Medications Medications  pantoprazole (PROTONIX) 80 mg in sodium chloride 0.9 % 250 mL (0.32 mg/mL) infusion (8 mg/hr Intravenous New Bag/Given 08/05/18 0742)  phytonadione (VITAMIN K) tablet 5 mg (has no administration in time range)  HYDROcodone-acetaminophen (NORCO/VICODIN) 5-325 MG per tablet 1 tablet (has no administration in time range)  letrozole [facility]) tablet 2.5 mg (has no administration in time range)  diltiazem (TIAZAC) 24 hr capsule 240 mg (has no administration in time range)  pravastatin (PRAVACHOL) tablet 40 mg (has no administration in time range)  buPROPion (WELLBUTRIN SR) 12 hr tablet 150 mg (has no administration in time range)  busPIRone (BUSPAR) tablet 10 mg (has no administration in time range)  citalopram (CELEXA)  tablet 20 mg (has no administration in time range)  LORazepam (ATIVAN) tablet 1 mg (has no administration in time range)  diltiazem (CARDIZEM) injection 10 mg (10 mg Intravenous Given 08/05/18 0627)  pantoprazole (PROTONIX) injection 40 mg (40 mg Intravenous Given 08/05/18 0629)  cefTRIAXone (ROCEPHIN) 1 g in sodium chloride 0.9 % 100 mL IVPB (0 g Intravenous Stopped 08/05/18 0720)  azithromycin (ZITHROMAX) 500 mg in sodium chloride 0.9 % 250 mL IVPB (0 mg Intravenous Stopped 08/05/18 0735)  sodium chloride 0.9 % bolus 1,000 mL (1,000 mLs Intravenous New Bag/Given 08/05/18 0739)    Mobility walks Low fall risk   Focused Assessments Cardiac Assessment Handoff:    Lab Results  Component Value Date   TROPONINI <0.03 08/05/2018   No results found for: DDIMER Does the Patient currently have chest pain? No     R Recommendations: See Admitting Provider Note  Report given to:   Additional Notes:

## 2018-08-05 NOTE — Consult Note (Signed)
Stacy Lame, MD [facility]  [address], [place], [place] [address] Phone: [phone] Fax : [phone]  Consultation  Referring Provider:     Dr. Leslye Peer Primary Care Physician:  Kirk Ruths, MD Primary Gastroenterologist:  Dr. Vira Agar         Reason for Consultation:     GI bleed with epigastric pain  Date of Admission:  08/05/2018 Date of Consultation:  08/05/2018         HPI:   Stacy Reese is a 73 y.o. female who has been having abdominal pain for the past week.  She reports that it is in the epigastric area.  She also has had some nausea.  The patient also states that she had some black stools but reports them not to be soft and only started after she started to take Pepto-Bismol.  Does report that she took some Advil x1 prior to admission and has been on a daily aspirin.  The patient was found to have a elevated white cell count at 13.0 with a hemoglobin of 15.0 on admission that was 13.9 this morning and 13.6 around noon.  She reports that she has had no further black stools.  The patient's abdominal pain is not associate with eating or drinking and she states it is worse with movement.  Patient was also noted to have a bump in her liver enzymes today from yesterday.  The patient's labs yesterday showed an AST of 34 with an ALT of 32 and this morning the AST went up to 55 with the ALT at 77.  The patient's bilirubin and alkaline phosphatase were normal during this time.  The patient had a CT scan of the abdomen that showed a foci of pneumonia in the right posterior lung with some diverticulosis without any diverticulitis.  There was extensive aortic and iliac artery arthrosclerotic changes.  There is also moderate spinal stenosis and gallstones seen with some sludge in the gallbladder but no gallbladder wall thickening.  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   DVT (deep venous thrombosis) ([facility])    Hypercholesterolemia    Hypertension    Personal history of chemotherapy    1 round   Personal history of radiation therapy    Skin cancer    squamous cell   Squamous cell carcinoma of skin    status post exicision    Past Surgical History:  Procedure Laterality Date   BREAST BIOPSY Left 09/04/2013   negative stereo bx   BREAST BIOPSY Left 08/25/2013   postive [place] core bx   BREAST LUMPECTOMY Left 08/2017   BREAST LUMPECTOMY W/ NEEDLE LOCALIZATION Left 2015   GANGLION CYST EXCISION      Prior to Admission medications   Medication Sig Start Date End Date Taking? Authorizing Provider  aspirin EC 81 MG tablet Take 81 mg by mouth daily.    Yes [provider]  buPROPion (WELLBUTRIN SR) 150 MG 12 hr tablet Take 150 mg by mouth daily.  02/12/14  Yes [provider]  busPIRone (BUSPAR) 10 MG tablet Take 10 mg by mouth daily.    Yes [provider]  cephALEXin (KEFLEX) 500 MG capsule Take 1 capsule (500 mg total) by  mouth 4 (four) times daily for 10 days. 08/04/18 08/14/18 Yes Nena Polio, MD  citalopram (CELEXA) 20 MG tablet Take 20 mg by mouth daily.    Yes [provider]  diltiazem (TIAZAC) 240 MG 24 hr capsule Take 240 mg by mouth daily.  07/13/14  Yes [provider]  esomeprazole (NEXIUM) 40 MG capsule Take 1 capsule (40 mg total) by mouth daily. 08/04/18 08/04/19 Yes Nena Polio, MD  HYDROcodone-acetaminophen (NORCO/VICODIN) 5-325 MG tablet Take 1 tablet by mouth every 6 (six) hours as needed for moderate pain. 08/04/18  Yes Nena Polio, MD  letrozole [facility]) 2.5 MG tablet TAKE ONE TABLET BY MOUTH DAILY 07/27/18  Yes Lloyd Huger, MD  LORazepam (ATIVAN) 1 MG tablet Take 1 mg by mouth as needed.    Yes [provider]  pravastatin  (PRAVACHOL) 40 MG tablet Take 40 mg by mouth daily.    Yes [provider]  triamterene-hydrochlorothiazide (MAXZIDE-25) 37.5-25 MG per tablet Take 1 tablet by mouth daily.    Yes [provider]  warfarin (COUMADIN) 2 MG tablet Take 2 mg by mouth daily.  12/08/17 12/08/18 Yes [provider]    Family History  Problem Relation Age of Onset   Cancer Father        colon   CAD Father    Hypertension Father    Hyperlipidemia Father    CAD Mother    Breast cancer Neg Hx      Social History   Tobacco Use   Smoking status: Former Smoker   Smokeless tobacco: Never Used  Substance Use Topics   Alcohol use: Yes    Comment: social   Drug use: No    Allergies as of 08/05/2018 - Review Complete 08/05/2018  Allergen Reaction Noted   Metoprolol Shortness Of Breath 07/11/2014   Other Other (See Comments) 07/11/2014    Review of Systems:    All systems reviewed and negative except where noted in HPI.   Physical Exam:  Vital signs in last 24 hours: Temp:  [97.3 F (36.3 C)-98.6 F (37 C)] 97.3 F (36.3 C) (05/15 1333) Pulse Rate:  [91-135] 99 (05/15 1333) Resp:  [14-20] 20 (05/15 1333) BP: (133-157)/(74-100) 144/88 (05/15 1417) SpO2:  [96 %-98 %] 97 % (05/15 1333) Weight:  [90.7 kg-95.1 kg] 95.1 kg (05/15 1000) Last BM Date: 08/05/18 General:   Pleasant, cooperative in NAD Head:  Normocephalic and atraumatic. Eyes:   No icterus.   Conjunctiva pink. PERRLA. Ears:  Normal auditory acuity. Neck:  Supple; no masses or thyroidomegaly Lungs: Respirations even and unlabored. Lungs clear to auscultation bilaterally.   No wheezes, crackles, or rhonchi.  Heart:  Regular rate and rhythm;  Without murmur, clicks, rubs or gallops Abdomen:  Soft, nondistended, positive tenderness to one finger palpation while flexing the abdominal wall muscles.. Normal bowel sounds. No appreciable masses or hepatomegaly.  No rebound or guarding.  Rectal:  Not  performed. Msk:  Symmetrical without gross deformities.   Extremities:  Without edema, cyanosis or clubbing. Neurologic:  Alert and oriented x3;  grossly normal neurologically. Skin:  Intact without significant lesions or rashes. Cervical Nodes:  No significant cervical adenopathy. Psych:  Alert and cooperative. Normal affect.  LAB RESULTS: Recent Labs    08/04/18 0628 08/05/18 0559 08/05/18 1249  WBC 9.7   9.7 13.0*  --   HGB 15.0   15.0 13.9 13.6  HCT 44.0   43.9 40.6  --   PLT 223  214 214  --    BMET Recent Labs    08/04/18 0628 08/05/18 0559  NA 135 129*  K 3.3* 3.1*  CL 100 92*  CO2 24 26  GLUCOSE 204* 185*  BUN 17 11  CREATININE 0.82 0.73  CALCIUM 9.8 8.8*   LFT Recent Labs    08/05/18 0559  PROT 7.3  ALBUMIN 3.9  AST 55*  ALT 77*  ALKPHOS 84  BILITOT 1.1   PT/INR Recent Labs    08/05/18 0559  LABPROT 22.7*  INR 2.0*    STUDIES: Ct Abdomen Pelvis W Contrast  Result Date: 08/04/2018 CLINICAL DATA:  Abdominal pain, radiating toward back EXAM: CT ABDOMEN AND PELVIS WITH CONTRAST TECHNIQUE: Multidetector CT imaging of the abdomen and pelvis was performed using the standard protocol following bolus administration of intravenous contrast. Oral contrast was also administered. CONTRAST:  13mL OMNIPAQUE IOHEXOL 300 MG/ML  SOLN COMPARISON:  None. FINDINGS: Lower chest: There is atelectatic change in each lung base. There is a small area of apparent pneumonia in the posterior segment right lower lobe. There are foci of coronary artery calcification. There is oral contrast in the distal esophagus, likely indicative of a degree of spontaneous gastroesophageal reflux. Hepatobiliary: There is hepatic steatosis. No focal liver lesion is appreciable. There appear to be gallstones, potentially intermingled with sludge, within the gallbladder. No appreciable gallbladder wall thickening evident. There is no evident biliary duct dilatation. Pancreas: There is no pancreatic  mass or inflammatory focus. Spleen: No splenic lesions are evident. Adrenals/Urinary Tract: Adrenals bilaterally appear unremarkable. Kidneys bilaterally show no evident mass or hydronephrosis on either side. There is no appreciable renal or ureteral calculus on either side. Urinary bladder is midline with wall thickness within normal limits. Stomach/Bowel: There are sigmoid and descending colonic diverticula without demonstrable diverticulitis. There is no appreciable bowel wall or mesenteric thickening. No evident bowel obstruction. Terminal ileum appears normal. There is mild lipomatous infiltration of the ileocecal valve. There is no appreciable free air or portal venous air. No findings suggesting bowel ischemia. Vascular/Lymphatic: There is aortic and bilateral iliac artery atherosclerosis. There is moderate calcification at the origin of the celiac artery. The superior mesenteric artery appears patent. There is no evident adenopathy in the abdomen or pelvis. Reproductive: Uterus is anteverted.  No pelvic mass evident. Other: Appendix appears normal. There is no abscess or ascites in the abdomen or pelvis. There is a minimal ventral hernia containing only fat. There is soft tissue opacification posterior to the paraspinous muscles of the lumbar region slightly deep to the subcutaneous regions, likely due to a degree of panniculitis. There is also subcutaneous edema in this area. Musculoskeletal: There is degenerative change in the lower thoracic and lumbar regions. There is moderate spinal stenosis at L3-4 due to disc protrusion and bony hypertrophy no intramuscular lesions are evident. IMPRESSION: 1.  Small focus of apparent pneumonia posterior right lung base. 2. Descending colonic and sigmoid diverticulosis without frank diverticulitis. No bowel obstruction. No abscess in the abdomen pelvis. Appendix appears normal. 3. No evident renal or ureteral calculus. No hydronephrosis. Urinary bladder wall thickness  normal. 4. Extensive aortic and iliac artery atherosclerosis. There appears to be hemodynamically significant narrowing at the origin of the left common iliac artery based on the degree of calcification present in this area. Foci of coronary artery calcification also noted. 5. Apparent cholelithiasis, likely intermingled with sludge. No gallbladder wall thickening evident by CT. 6.  Hepatic steatosis. 7.  Moderate spinal stenosis at L3-4, multifactorial.  8. 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 Electronically Signed   By: Lowella Grip III M.D.   On: 08/04/2018 10:21   Dg Chest Port 1 View  Result Date: 08/05/2018 CLINICAL DATA:  73 year old female with possible pneumonia in the right costophrenic angle on CT Abdomen and Pelvis yesterday. Persistent abdominal and back pain. EXAM: PORTABLE CHEST 1 VIEW COMPARISON:  CT Abdomen and Pelvis 08/04/2018 and earlier. FINDINGS: Portable AP upright view at 0548 hours. Stable mild cardiomegaly. Other mediastinal contours are within normal limits. Visualized tracheal air column is within normal limits. Allowing for portable technique the lungs are clear. No pneumothorax or pleural effusion. In the visible upper abdomen there is oral contrast in nondilated transverse colon. No pneumoperitoneum identified. IMPRESSION: 1. No acute cardiopulmonary abnormality by portable x-ray. Suspect right costophrenic angle opacity seen yesterday was atelectasis. 2. Oral contrast from yesterday in nondilated transverse colon. Electronically Signed   By: Genevie Ann M.D.   On: 08/05/2018 06:27   US Abdomen Limited Ruq  Result Date: 08/04/2018 CLINICAL DATA:  Right upper quadrant pain and gallstones on recent CT EXAM: ULTRASOUND ABDOMEN LIMITED RIGHT UPPER QUADRANT COMPARISON:  CT from earlier in the same day. FINDINGS: Gallbladder: Gallbladder is well distended with multiple stones  within. No wall thickening or pericholecystic fluid is noted. Negative sonographic Murphy's sign is noted. Common bile duct: Diameter: 6.2 mm. Liver: Increased in echogenicity consistent with fatty infiltration. No focal mass is seen. Portal vein is patent on color Doppler imaging with normal direction of blood flow towards the liver. IMPRESSION: Fatty liver. Cholelithiasis without complicating factors. Electronically Signed   By: Inez Catalina M.D.   On: 08/04/2018 11:36      Impression / Plan:   Assessment: Active Problems:   Upper GI bleed   Melena   Stacy Reese is a 73 y.o. y/o female with abdominal pain that appears to be musculoskeletal and clearly reproducible with one finger palpation reproducing the pain while flexing the abdominal wall muscles.  The patient also had black stools with a recent history of taking Pepto-Bismol which was likely the cause of the black stools.  The patient had taken Coumadin last night and was given some vitamin K on admission.  Plan:  The patient will be set up for an upper endoscopy to rule out any peptic ulcer disease although the black stools are likely due to her Pepto-Bismol use since she denies any black tarry stools they were just black in color but normal consistency.  The abdominal exam is consistent with the patient having musculoskeletal pain.  The patient reports that her history includes chronic back pain and abdominal muscular pain is commonly seen in patients who have chronic back pain.  The patient has been explained the plan and agrees with it.  Thank you for involving me in the care of this patient.      LOS: 0 days   Stacy Lame, MD  08/05/2018, 2:25 PM    Note: This dictation was prepared with Dragon dictation along with smaller phrase technology. Any transcriptional errors that result from this process are unintentional.

## 2018-08-05 NOTE — ED Notes (Signed)
Report given to Dana, RN

## 2018-08-05 NOTE — H&P (Signed)
[facility] [place] NAME: Stacy Reese    MR#:  [id number]  DATE OF BIRTH:  06-Mar-1946  DATE OF ADMISSION:  08/05/2018  PRIMARY CARE PHYSICIAN: Kirk Ruths, MD   REQUESTING/REFERRING PHYSICIAN: Dr Lurline Hare  CHIEF COMPLAINT:   Chief Complaint  Patient presents with  . Abdominal Pain    HISTORY OF PRESENT ILLNESS:  Stacy Reese  is a 73 y.o. female presents back to the emergency room today.  For the past week she has been having bad stomach pain she points in her epigastric area.  She has been having black stools about 2 times per day.  Had some nausea vomiting today.  She states the pain is 8 out of 10 in intensity which wraps around to her back but not the center of her back.  She feels hot.  She feels horrible.  She feels weak.  In the ER, she was found to be hyponatremic and in rapid atrial fibrillation.  Hemoglobin did come down from yesterday from 15 down to 13.9.  ER physician placed the patient on a Protonix drip.  Hospitalist services were contacted for further evaluation.  COVID-19 test was negative.  PAST MEDICAL HISTORY:   Past Medical History:  Diagnosis Date  . Atrial fibrillation ([place])   . 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 DVT (deep venous thrombosis) ([place])   . Hypercholesterolemia   . Hypertension   . Personal history of chemotherapy    1 round  . Personal history of radiation therapy   . Skin cancer    squamous cell  . Squamous cell carcinoma of skin    status post exicision    PAST SURGICAL HISTORY:   Past Surgical History:  Procedure Laterality Date  . BREAST BIOPSY Left 09/04/2013   negative stereo bx  . BREAST BIOPSY Left 08/25/2013   postive [place] core bx  . BREAST LUMPECTOMY Left 08/2017  . BREAST LUMPECTOMY W/ NEEDLE LOCALIZATION Left 2015  . GANGLION CYST EXCISION      SOCIAL HISTORY:   Social History   Tobacco Use  . Smoking  status: Former Research scientist (life sciences)  . Smokeless tobacco: Never Used  Substance Use Topics  . Alcohol use: Yes    Comment: social    FAMILY HISTORY:   Family History  Problem Relation Age of Onset  . Cancer Father        colon  . CAD Father   . Hypertension Father   . Hyperlipidemia Father   . CAD Mother   . Breast cancer Neg Hx     DRUG ALLERGIES:   Allergies  Allergen Reactions  . Metoprolol Shortness Of Breath  . Other Other (See Comments)    Sodium pentothal  Causes blood clots    REVIEW OF SYSTEMS:  CONSTITUTIONAL: No fever, but feels hot.  Positive for fatigue.  EYES: Wears glasses.  Positive for fuzzy vision.Marland Kitchen  EARS, NOSE, AND THROAT: No tinnitus or ear pain. No sore throat RESPIRATORY: No cough, shortness of breath, wheezing or hemoptysis.  CARDIOVASCULAR: No chest pain, orthopnea, edema.  GASTROINTESTINAL: Positive for nausea, vomiting and epigastric abdominal pain.  Positive for black stool GENITOURINARY: No dysuria, hematuria.  ENDOCRINE: No polyuria, nocturia,  HEMATOLOGY: No anemia, easy bruising or bleeding SKIN: No rash or lesion. MUSCULOSKELETAL: No joint pain or arthritis.   NEUROLOGIC: No tingling, numbness, weakness.  PSYCHIATRY: Positive for anxiety and depression  MEDICATIONS  AT HOME:   Prior to Admission medications   Medication Sig Start Date End Date Taking? Authorizing Provider  aspirin EC 81 MG tablet Take 81 mg by mouth daily.     [provider]  buPROPion (WELLBUTRIN SR) 150 MG 12 hr tablet Take 150 mg by mouth daily.  02/12/14   [provider]  busPIRone (BUSPAR) 10 MG tablet Take 10 mg by mouth daily.     [provider]  cephALEXin (KEFLEX) 500 MG capsule Take 1 capsule (500 mg total) by mouth 4 (four) times daily for 10 days. 08/04/18 08/14/18  Nena Polio, MD  citalopram (CELEXA) 20 MG tablet Take 20 mg by mouth daily.     [provider]  diltiazem (TIAZAC) 240 MG 24 hr capsule Take 240 mg by mouth daily.   07/13/14   [provider]  esomeprazole (NEXIUM) 40 MG capsule Take 1 capsule (40 mg total) by mouth daily. 08/04/18 08/04/19  Nena Polio, MD  HYDROcodone-acetaminophen (NORCO/VICODIN) 5-325 MG tablet Take 1 tablet by mouth every 6 (six) hours as needed for moderate pain. 08/04/18   Nena Polio, MD  letrozole [facility]) 2.5 MG tablet TAKE ONE TABLET BY MOUTH DAILY 07/27/18   Lloyd Huger, MD  LORazepam (ATIVAN) 1 MG tablet Take 1 mg by mouth as needed.     [provider]  pravastatin (PRAVACHOL) 40 MG tablet Take 40 mg by mouth daily.     [provider]  triamterene-hydrochlorothiazide (MAXZIDE-25) 37.5-25 MG per tablet Take 1 tablet by mouth daily.     [provider]  warfarin (COUMADIN) 2 MG tablet Take 2 mg by mouth daily.  12/08/17 12/08/18  [provider]      VITAL SIGNS:  Blood pressure (!) 157/100, pulse (!) 122, temperature 98.3 F (36.8 C), temperature source Oral, resp. rate 15, height 5\' 5"  (1.651 m), weight 90.7 kg, SpO2 96 %.  PHYSICAL EXAMINATION:  GENERAL:  73 y.o.-year-old patient lying in the bed with no acute distress.  EYES: Pupils equal, round, reactive to light and accommodation. No scleral icterus. Extraocular muscles intact.  HEENT: Head atraumatic, normocephalic. Oropharynx and nasopharynx clear.  NECK:  Supple, no jugular venous distention. No thyroid enlargement, no tenderness.  LUNGS: Decreased breath sounds bilateral bases, no wheezing, rales,rhonchi or crepitation. No use of accessory muscles of respiration.  CARDIOVASCULAR: S1, S2 irregular regular tachycardic. No murmurs, rubs, or gallops.  ABDOMEN: Soft, epigastric tenderness, nondistended. Bowel sounds present. No organomegaly or mass.  EXTREMITIES: Trace pedal edema, cyanosis, or clubbing.  NEUROLOGIC: Cranial nerves II through XII are intact. Muscle strength 5/5 in all extremities. Sensation intact. Gait not checked.  PSYCHIATRIC: The patient is  alert and oriented x 3.  SKIN: No rash, lesion, or ulcer.   LABORATORY PANEL:   CBC Recent Labs  Lab 08/05/18 0559  WBC 13.0*  HGB 13.9  HCT 40.6  PLT 214   ------------------------------------------------------------------------------------------------------------------  Chemistries  Recent Labs  Lab 08/05/18 0559  NA 129*  K 3.1*  CL 92*  CO2 26  GLUCOSE 185*  BUN 11  CREATININE 0.73  CALCIUM 8.8*  AST 55*  ALT 77*  ALKPHOS 84  BILITOT 1.1   ------------------------------------------------------------------------------------------------------------------  Cardiac Enzymes Recent Labs  Lab 08/05/18 0559  TROPONINI <0.03   ------------------------------------------------------------------------------------------------------------------  RADIOLOGY:  Ct Abdomen Pelvis W Contrast  Result Date: 08/04/2018 CLINICAL DATA:  Abdominal pain, radiating toward back EXAM: CT ABDOMEN AND PELVIS WITH CONTRAST TECHNIQUE: Multidetector CT imaging of the  abdomen and pelvis was performed using the standard protocol following bolus administration of intravenous contrast. Oral contrast was also administered. CONTRAST:  179mL OMNIPAQUE IOHEXOL 300 MG/ML  SOLN COMPARISON:  None. FINDINGS: Lower chest: There is atelectatic change in each lung base. There is a small area of apparent pneumonia in the posterior segment right lower lobe. There are foci of coronary artery calcification. There is oral contrast in the distal esophagus, likely indicative of a degree of spontaneous gastroesophageal reflux. Hepatobiliary: There is hepatic steatosis. No focal liver lesion is appreciable. There appear to be gallstones, potentially intermingled with sludge, within the gallbladder. No appreciable gallbladder wall thickening evident. There is no evident biliary duct dilatation. Pancreas: There is no pancreatic mass or inflammatory focus. Spleen: No splenic lesions are evident. Adrenals/Urinary Tract: Adrenals  bilaterally appear unremarkable. Kidneys bilaterally show no evident mass or hydronephrosis on either side. There is no appreciable renal or ureteral calculus on either side. Urinary bladder is midline with wall thickness within normal limits. Stomach/Bowel: There are sigmoid and descending colonic diverticula without demonstrable diverticulitis. There is no appreciable bowel wall or mesenteric thickening. No evident bowel obstruction. Terminal ileum appears normal. There is mild lipomatous infiltration of the ileocecal valve. There is no appreciable free air or portal venous air. No findings suggesting bowel ischemia. Vascular/Lymphatic: There is aortic and bilateral iliac artery atherosclerosis. There is moderate calcification at the origin of the celiac artery. The superior mesenteric artery appears patent. There is no evident adenopathy in the abdomen or pelvis. Reproductive: Uterus is anteverted.  No pelvic mass evident. Other: Appendix appears normal. There is no abscess or ascites in the abdomen or pelvis. There is a minimal ventral hernia containing only fat. There is soft tissue opacification posterior to the paraspinous muscles of the lumbar region slightly deep to the subcutaneous regions, likely due to a degree of panniculitis. There is also subcutaneous edema in this area. Musculoskeletal: There is degenerative change in the lower thoracic and lumbar regions. There is moderate spinal stenosis at L3-4 due to disc protrusion and bony hypertrophy no intramuscular lesions are evident. IMPRESSION: 1.  Small focus of apparent pneumonia posterior right lung base. 2. Descending colonic and sigmoid diverticulosis without frank diverticulitis. No bowel obstruction. No abscess in the abdomen pelvis. Appendix appears normal. 3. No evident renal or ureteral calculus. No hydronephrosis. Urinary bladder wall thickness normal. 4. Extensive aortic and iliac artery atherosclerosis. There appears to be hemodynamically  significant narrowing at the origin of the left common iliac artery based on the degree of calcification present in this area. Foci of coronary artery calcification also noted. 5. Apparent cholelithiasis, likely intermingled with sludge. No gallbladder wall thickening evident by CT. 6.  Hepatic steatosis. 7.  Moderate spinal stenosis at L3-4, multifactorial. 8. 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 Electronically Signed   By: Lowella Grip III M.D.   On: 08/04/2018 10:21   Dg Chest Port 1 View  Result Date: 08/05/2018 CLINICAL DATA:  73 year old female with possible pneumonia in the right costophrenic angle on CT Abdomen and Pelvis yesterday. Persistent abdominal and back pain. EXAM: PORTABLE CHEST 1 VIEW COMPARISON:  CT Abdomen and Pelvis 08/04/2018 and earlier. FINDINGS: Portable AP upright view at 0548 hours. Stable mild cardiomegaly. Other mediastinal contours are within normal limits. Visualized tracheal air column is within normal limits. Allowing for portable technique the lungs are clear. No pneumothorax or pleural effusion.  In the visible upper abdomen there is oral contrast in nondilated transverse colon. No pneumoperitoneum identified. IMPRESSION: 1. No acute cardiopulmonary abnormality by portable x-ray. Suspect right costophrenic angle opacity seen yesterday was atelectasis. 2. Oral contrast from yesterday in nondilated transverse colon. Electronically Signed   By: Genevie Ann M.D.   On: 08/05/2018 06:27   US Abdomen Limited Ruq  Result Date: 08/04/2018 CLINICAL DATA:  Right upper quadrant pain and gallstones on recent CT EXAM: ULTRASOUND ABDOMEN LIMITED RIGHT UPPER QUADRANT COMPARISON:  CT from earlier in the same day. FINDINGS: Gallbladder: Gallbladder is well distended with multiple stones within. No wall thickening or pericholecystic fluid is noted. Negative sonographic Murphy's sign  is noted. Common bile duct: Diameter: 6.2 mm. Liver: Increased in echogenicity consistent with fatty infiltration. No focal mass is seen. Portal vein is patent on color Doppler imaging with normal direction of blood flow towards the liver. IMPRESSION: Fatty liver. Cholelithiasis without complicating factors. Electronically Signed   By: Inez Catalina M.D.   On: 08/04/2018 11:36    EKG:   Atrial fibrillation 121 bpm  IMPRESSION AND PLAN:   1.  Upper GI bleed with epigastric abdominal pain and melena.  We will give 1 dose of oral vitamin K to reverse Coumadin.  Patient placed on Protonix drip.  Hold warfarin and aspirin.  Continue to monitor serial hemoglobins. 2.  Atrial fibrillation with rapid ventricular response.  Give her oral Cardizem and IV fluids.  Give IV magnesium. 3.  Pneumonia seen on previous CT scan.  Patient not complaining of respiratory symptoms I will continue antibiotics.  COVID-19 testing negative 4.  Hyponatremia and hypokalemia.  Normal saline with potassium IV fluid supplementation 5.  Acute cystitis.  Send off urine culture.  Rocephin would cover. 6.  Breast cancer on Femara treatment 7.  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  Holding anticoagulation currently 8.  Hypertension.  Hold Maxide and continue Cardizem orally.  All the records are reviewed and case discussed with ED provider. Management plans discussed with the patient, and she is in agreement.  CODE STATUS: DNR  TOTAL TIME TAKING CARE OF THIS PATIENT: 50 minutes.    Loletha Grayer M.D on 08/05/2018 at 7:55 AM  Between 7am to 6pm - Pager - [phone]  After 6pm call admission pager [phone]  [facility]  [phone]  CC: Primary care physician; Kirk Ruths, MD

## 2018-08-05 NOTE — Transfer of Care (Signed)
Immediate Anesthesia Transfer of Care Note  Patient: Stacy Reese  Procedure(s) Performed: ESOPHAGOGASTRODUODENOSCOPY (EGD) WITH PROPOFOL (N/A )  Patient Location: PACU  Anesthesia Type:General  Level of Consciousness: drowsy  Airway & Oxygen Therapy: Patient Spontanous Breathing and Patient connected to nasal cannula oxygen  Post-op Assessment: Report given to RN and Post -op Vital signs reviewed and stable  Post vital signs: Reviewed and stable  Last Vitals:  Vitals Value Taken Time  BP    Temp    Pulse 37 08/05/2018  2:17 PM  Resp 22 08/05/2018  2:17 PM  SpO2 98 % 08/05/2018  2:17 PM  Vitals shown include unvalidated device data.  Last Pain:  Vitals:   08/05/18 1333  TempSrc: Tympanic  PainSc: 7          Complications: No apparent anesthesia complications

## 2018-08-05 NOTE — ED Notes (Signed)
2.3 lactic acid, provider will be notified.

## 2018-08-05 NOTE — Anesthesia Preprocedure Evaluation (Signed)
Anesthesia Evaluation  Patient identified by MRN, date of birth, ID band Patient awake    Reviewed: Allergy & Precautions, NPO status , Patient's Chart, lab work & pertinent test results  Airway Mallampati: III       Dental   Pulmonary former smoker,    Pulmonary exam normal        Cardiovascular hypertension, Pt. 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 No date: DVT (deep venous thrombosis) ([facility]) No date: Hypercholesterolemia No date: Hypertension No date: Personal history of chemotherapy     Comment:  1 round No date: Personal history of radiation therapy No date: Skin cancer     Comment:  squamous cell No date: Squamous cell carcinoma of skin     Comment:  status post exicision  Reproductive/Obstetrics                             Anesthesia Physical Anesthesia Plan  ASA: III  Anesthesia Plan: General   Post-op Pain Management:    Induction: Intravenous  PONV Risk Score and Plan:   Airway Management Planned: Nasal Cannula  Additional Equipment:   Intra-op Plan:   Post-operative Plan:   Informed Consent: I have reviewed the patients History and Physical, chart, labs and discussed the procedure including the risks, benefits and alternatives for the proposed anesthesia with the patient or authorized representative who has indicated his/her understanding and acceptance.     Dental  advisory given  Plan Discussed with: CRNA and Surgeon  Anesthesia Plan Comments:         Anesthesia Quick Evaluation

## 2018-08-05 NOTE — Progress Notes (Signed)
UPDATE:  Pt readmitted for afib, RVR, main complaint of melena now.  Still complains focal epigastric pain that is reproducible with palpation.  She was admitted under the hospitalist for further work-up including a GI consultation and EGD.  Report shows gastritis.  Recommended to primary, if abdominal pain continues despite the antibiotic use, cholecystostomy tube placement may need to be considered by IR.  She is still a poor candidate for any surgery.  I also recommended an outpatient vascular surgery follow-up regarding her incidental atherosclerosis noted in her aorta and left common iliac.  General surgery will sign off at this point.  Please call us back with any new concerns.

## 2018-08-05 NOTE — Anesthesia Procedure Notes (Signed)
Performed by: Beau Vanduzer, CRNA Pre-anesthesia Checklist: Patient identified, Emergency Drugs available, Suction available, Patient being monitored and Timeout performed Patient Re-evaluated:Patient Re-evaluated prior to induction Oxygen Delivery Method: Nasal cannula Induction Type: IV induction       

## 2018-08-05 NOTE — Op Note (Addendum)
[facility] Patient Name: Stacy Reese Procedure Date: 08/05/2018 2:04 PM MRN: [id number] Account #: [id number] Date of Birth: 07/21/1945 Admit Type: Inpatient Age: 73 Room: [facility] ENDO ROOM 4 Gender: Female Note Status: Finalized Procedure:            Upper GI endoscopy Indications:          Melena Providers:            Lucilla Lame MD, MD Referring MD:         Ocie Cornfield. Ouida Sills MD, MD (Referring MD) Medicines:            Propofol per Anesthesia Complications:        No immediate complications. Procedure:            Pre-Anesthesia Assessment:                       - Prior to the procedure, a History and Physical was                        performed, and patient medications and allergies were                        reviewed. The patient's tolerance of previous                        anesthesia was also reviewed. The risks and benefits of                        the procedure and the sedation options and risks were                        discussed with the patient. All questions were                        answered, and informed consent was obtained. Prior                        Anticoagulants: The patient has taken Coumadin                        (warfarin), last dose was day of procedure. ASA Grade                        Assessment: II - A patient with mild systemic disease.                        After reviewing the risks and benefits, the patient was                        deemed in satisfactory condition to undergo the                        procedure.                       After obtaining informed consent, the endoscope was                        passed under direct vision. Throughout the procedure,  the patient's blood pressure, pulse, and oxygen                        saturations were monitored continuously. The Endoscope                        was introduced through the mouth, and advanced to the   second part of duodenum. The upper GI endoscopy was                        accomplished without difficulty. The patient tolerated                        the procedure well. Findings:      The Z-line was irregular.      Diffuse mild inflammation characterized by erythema was found in the       gastric antrum.      The examined duodenum was normal. Impression:           - Z-line irregular.                       - Gastritis.                       - Normal examined duodenum.                       - No specimens collected. Recommendation:       - Return patient to hospital ward for ongoing care.                       - Advance diet as tolerated.                       - Continue present medications.                       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                       - Abdominal pain likely muscular with reprodcution with                        flexion of abdominal wall muscles Procedure Code(s):    --- Professional ---                       [phone], Esophagogastroduodenoscopy, flexible, transoral;                        diagnostic, including collection of specimen(s) by                        brushing or washing, when performed (separate procedure) Diagnosis Code(s):    --- Professional ---                       K92.1, Melena (includes Hematochezia)                       K29.70, Gastritis, unspecified, without bleeding CPT copyright 2019 American Medical Association. All rights reserved. The codes documented in this report are preliminary and upon coder review may  be revised to meet current compliance requirements. Lucilla Lame MD, MD 08/05/2018 2:20:46 PM This report has been signed electronically. Number of Addenda: 0 Note Initiated On: 08/05/2018 2:04 PM      [facility]

## 2018-08-05 NOTE — ED Triage Notes (Signed)
Patient ambulatory to triage with steady gait, without difficulty or distress noted; pt reports seen yesterday for upper abd/back pain; pain persists with N/V and black stools; st was dx with gallbladder issues

## 2018-08-05 NOTE — ED Notes (Signed)
Attempted to call to see if accepting RN had any questions, she is unable to come to the phone, will call me back in 5 minutes.

## 2018-08-05 NOTE — Anesthesia Post-op Follow-up Note (Signed)
Anesthesia QCDR form completed.        

## 2018-08-06 LAB — CBC
HCT: 38 % (ref 36.0–46.0)
Hemoglobin: 12.7 g/dL (ref 12.0–15.0)
MCH: 28.5 pg (ref 26.0–34.0)
MCHC: 33.4 g/dL (ref 30.0–36.0)
MCV: 85.4 fL (ref 80.0–100.0)
Platelets: 172 10*3/uL (ref 150–400)
RBC: 4.45 MIL/uL (ref 3.87–5.11)
RDW: 12.9 % (ref 11.5–15.5)
WBC: 12.8 10*3/uL — ABNORMAL HIGH (ref 4.0–10.5)
nRBC: 0 % (ref 0.0–0.2)

## 2018-08-06 LAB — BASIC METABOLIC PANEL
Anion gap: 7 (ref 5–15)
BUN: 7 mg/dL — ABNORMAL LOW (ref 8–23)
CO2: 27 mmol/L (ref 22–32)
Calcium: 8.3 mg/dL — ABNORMAL LOW (ref 8.9–10.3)
Chloride: 102 mmol/L (ref 98–111)
Creatinine, Ser: 0.64 mg/dL (ref 0.44–1.00)
GFR calc Af Amer: >60 mL/min (ref >60)
GFR calc non Af Amer: >60 mL/min (ref >60)
Glucose, Bld: 164 mg/dL — ABNORMAL HIGH (ref 70–99)
Potassium: 3.1 mmol/L — ABNORMAL LOW (ref 3.5–5.1)
Sodium: 136 mmol/L (ref 135–145)

## 2018-08-06 LAB — URINE CULTURE
Culture: 40000 — AB
Special Requests: NORMAL

## 2018-08-06 LAB — HIV ANTIBODY (ROUTINE TESTING W REFLEX): HIV Screen 4th Generation wRfx: NONREACTIVE

## 2018-08-06 LAB — PROTIME-INR
INR: 2.4 — ABNORMAL HIGH (ref 0.8–1.2)
Prothrombin Time: 25.8 seconds — ABNORMAL HIGH (ref 11.4–15.2)

## 2018-08-06 MED ORDER — SENNA 8.6 MG PO TABS
1.0000 | ORAL_TABLET | Freq: Every day | ORAL | Status: DC
  Administered 2018-08-06: 8.6 mg via ORAL
  Filled 2018-08-06: qty 1

## 2018-08-06 MED ORDER — SIMETHICONE 40 MG/0.6ML PO SUSP
80.0000 mg | Freq: Four times a day (QID) | ORAL | Status: DC
  Administered 2018-08-06 (×2): 80 mg via ORAL
  Filled 2018-08-06 (×18): qty 1.2

## 2018-08-06 MED ORDER — DOCUSATE SODIUM 100 MG PO CAPS
200.0000 mg | ORAL_CAPSULE | Freq: Two times a day (BID) | ORAL | Status: DC
  Administered 2018-08-06: 200 mg via ORAL
  Filled 2018-08-06: qty 2

## 2018-08-06 MED ORDER — PANTOPRAZOLE SODIUM 40 MG IV SOLR
40.0000 mg | Freq: Two times a day (BID) | INTRAVENOUS | Status: DC
  Administered 2018-08-06 – 2018-08-08 (×5): 40 mg via INTRAVENOUS
  Filled 2018-08-06 (×5): qty 40

## 2018-08-06 MED ORDER — POTASSIUM CHLORIDE CRYS ER 20 MEQ PO TBCR
40.0000 meq | EXTENDED_RELEASE_TABLET | ORAL | Status: AC
  Administered 2018-08-06 (×2): 40 meq via ORAL
  Filled 2018-08-06 (×2): qty 2

## 2018-08-06 MED ORDER — POLYETHYLENE GLYCOL 3350 17 G PO PACK
17.0000 g | PACK | Freq: Every day | ORAL | Status: DC
  Administered 2018-08-06: 17 g via ORAL
  Filled 2018-08-06: qty 1

## 2018-08-06 NOTE — Progress Notes (Signed)
Advanced care plan.  Patient requested to readdress her CODE STATUS  Purpose of the Encounter: CODE STATUS  Parties in Attendance: Patient herself  Patient's Decision Capacity: Intact  Subjective/Patient's story: Stacy Reese  is a 73 y.o. female presents back to the emergency room today.  For the past week she has been having bad stomach pain she points in her epigastric area.  She has been having black stools about 2 times per day.  Had some nausea vomiting today.  She states the pain is 8 out of 10 in intensity which wraps around to her back but not the center of her back.  She feels hot.  She feels horrible.  She feels weak.  In the ER, she was found to be hyponatremic and in rapid atrial fibrillation   Objective/Medical story Patient was written to be DNR.  Nurse called me to discuss patient's CODE STATUS per patient request.  I explained her what DNR entails.  Also asked if she had a living will or healthcare power of attorney   Goals of care determination:  Patient states that she would like to be a limited code and does not want to be placed on a ventilator but would like everything else to be done.   CODE STATUS: Limited code as per patient's request   Time spent discussing advanced care planning: 16 minutes

## 2018-08-06 NOTE — Plan of Care (Signed)
  Problem: Clinical Measurements: Goal: Ability to maintain clinical measurements within normal limits will improve Outcome: Progressing   Problem: Elimination: Goal: Will not experience complications related to urinary retention Outcome: Progressing   Problem: Pain Managment: Goal: General experience of comfort will improve Outcome: Progressing   Problem: Safety: Goal: Ability to remain free from injury will improve Outcome: Progressing   

## 2018-08-06 NOTE — Progress Notes (Signed)
[facility]                                                                                                                                                                                  Patient Demographics   Lamoine Fredricksen, is a 73 y.o. female, DOB - 12-07-1945, HWE:[id number]  Admit date - 08/05/2018   Admitting Physician Loletha Grayer, MD  Outpatient Primary MD for the patient is Kirk Ruths, MD   LOS - 1  Subjective: Patient admitted with abdominal pain states the pain is improved.     Review of Systems:   CONSTITUTIONAL: No documented fever. No fatigue, weakness. No weight gain, no weight loss.  EYES: No blurry or double vision.  ENT: No tinnitus. No postnasal drip. No redness of the oropharynx.  RESPIRATORY: No cough, no wheeze, no hemoptysis. No dyspnea.  CARDIOVASCULAR: No chest pain. No orthopnea. No palpitations. No syncope.  GASTROINTESTINAL: No nausea, no vomiting or diarrhea.  Positive abdominal pain. No melena or hematochezia.  GENITOURINARY: No dysuria or hematuria.  ENDOCRINE: No polyuria or nocturia. No heat or cold intolerance.  HEMATOLOGY: No anemia. No bruising. No bleeding.  INTEGUMENTARY: No rashes. No lesions.  MUSCULOSKELETAL: No arthritis. No swelling. No gout.  NEUROLOGIC: No numbness, tingling, or ataxia. No seizure-type activity.  PSYCHIATRIC: No anxiety. No insomnia. No ADD.    Vitals:   Vitals:   08/05/18 1537 08/05/18 1946 08/06/18 0406 08/06/18 0820  BP: (!) 150/88 (!) 145/90 132/90 133/76  Pulse: 100 (!) 104 (!) 101 (!) 119  Resp: 20 17 18    Temp:  98.6 F (37 C) 99.1 F (37.3 C) 98.4 F (36.9 C)  TempSrc:  Oral Oral Oral  SpO2: 99% 96% 93% 94%  Weight:      Height:        Wt Readings from Last 3 Encounters:  08/05/18 95.1 kg  08/04/18 90.7 kg  12/28/17 95.9 kg     Intake/Output Summary (Last 24 hours) at 08/06/2018 1402 Last data filed at 08/06/2018 0300 Gross per 24 hour   Intake 1000 ml  Output -  Net 1000 ml    Physical Exam:   GENERAL: Pleasant-appearing in no apparent distress.  HEAD, EYES, EARS, NOSE AND THROAT: Atraumatic, normocephalic. Extraocular muscles are intact. Pupils equal and reactive to light. Sclerae anicteric. No conjunctival injection. No oro-pharyngeal erythema.  NECK: Supple. There is no jugular venous distention. No bruits, no lymphadenopathy, no thyromegaly.  HEART: Regular rate and rhythm,. No murmurs, no rubs, no clicks.  LUNGS: Clear to auscultation bilaterally. No rales or rhonchi. No wheezes.  ABDOMEN: Soft, flat, nontender, nondistended. Has good bowel sounds. No  hepatosplenomegaly appreciated.  EXTREMITIES: No evidence of any cyanosis, clubbing, or peripheral edema.  +2 pedal and radial pulses bilaterally.  NEUROLOGIC: The patient is alert, awake, and oriented x3 with no focal motor or sensory deficits appreciated bilaterally.  SKIN: Moist and warm with no rashes appreciated.  Psych: Not anxious, depressed LN: No inguinal LN enlargement    Antibiotics   Anti-infectives (From admission, onward)   Start     Dose/Rate Route Frequency Ordered Stop   08/06/18 1000  azithromycin (ZITHROMAX) tablet 250 mg     250 mg Oral Daily 08/05/18 0806     08/06/18 0800  cefTRIAXone (ROCEPHIN) 2 g in sodium chloride 0.9 % 100 mL IVPB     2 g 200 mL/hr over 30 Minutes Intravenous Every 24 hours 08/05/18 0806     08/05/18 0600  cefTRIAXone (ROCEPHIN) 1 g in sodium chloride 0.9 % 100 mL IVPB     1 g 200 mL/hr over 30 Minutes Intravenous  Once 08/05/18 0547 08/05/18 0720   08/05/18 0600  azithromycin (ZITHROMAX) 500 mg in sodium chloride 0.9 % 250 mL IVPB     500 mg 250 mL/hr over 60 Minutes Intravenous  Once 08/05/18 0547 08/05/18 0735      Medications   Scheduled Meds: . azithromycin  250 mg Oral Daily  . buPROPion  150 mg Oral Daily  . busPIRone  10 mg Oral Daily  . citalopram  20 mg Oral Daily  . diltiazem  240 mg Oral Daily   . docusate sodium  200 mg Oral BID  . letrozole  2.5 mg Oral Daily  . pantoprazole (PROTONIX) IV  40 mg Intravenous Q12H  . polyethylene glycol  17 g Oral Daily  . potassium chloride  40 mEq Oral Q4H  . pravastatin  40 mg Oral Daily  . senna  1 tablet Oral Daily  . simethicone  80 mg Oral QID   Continuous Infusions: . 0.9 % NaCl with KCl 20 mEq / L 75 mL/hr at 08/06/18 0408  . cefTRIAXone (ROCEPHIN)  IV 2 g (08/06/18 0816)   PRN Meds:.acetaminophen **OR** acetaminophen, diltiazem, HYDROcodone-acetaminophen, LORazepam, ondansetron **OR** ondansetron (ZOFRAN) IV   Data Review:   Micro Results Recent Results (from the past 240 hour(s))  Culture, blood (routine x 2)     Status: None (Preliminary result)   Collection Time: 08/05/18  5:59 AM  Result Value Ref Range Status   Specimen Description BLOOD BLOOD RIGHT FOREARM  Final   Special Requests   Final    BOTTLES DRAWN AEROBIC AND ANAEROBIC Blood Culture results may not be optimal due to an excessive volume of blood received in culture bottles   Culture   Final    NO GROWTH 1 DAY Performed at [facility], [address] [place], [place] [address]    Report Status PENDING  Incomplete  SARS Coronavirus 2 (CEPHEID - Performed in [facility] hospital lab), Hosp Order     Status: None   Collection Time: 08/05/18  6:15 AM  Result Value Ref Range Status   SARS Coronavirus 2 NEGATIVE NEGATIVE Final    Comment: (NOTE) If result is NEGATIVE SARS-CoV-2 target nucleic acids are NOT DETECTED. The SARS-CoV-2 RNA is generally detectable in upper and lower  respiratory specimens during the acute phase of infection. The lowest  concentration of SARS-CoV-2 viral copies this assay can detect is 250  copies / mL. A negative result does not preclude SARS-CoV-2 infection  and should not be used as the  sole basis for treatment or other  patient management decisions.  A negative result may occur with  improper specimen collection /  handling, submission of specimen other  than nasopharyngeal swab, presence of viral mutation(s) within the  areas targeted by this assay, and inadequate number of viral copies  (<250 copies / mL). A negative result must be combined with clinical  observations, patient history, and epidemiological information. If result is POSITIVE SARS-CoV-2 target nucleic acids are DETECTED. The SARS-CoV-2 RNA is generally detectable in upper and lower  respiratory specimens dur ing the acute phase of infection.  Positive  results are indicative of active infection with SARS-CoV-2.  Clinical  correlation with patient history and other diagnostic information is  necessary to determine patient infection status.  Positive results do  not rule out bacterial infection or co-infection with other viruses. If result is PRESUMPTIVE POSTIVE SARS-CoV-2 nucleic acids MAY BE PRESENT.   A presumptive positive result was obtained on the submitted specimen  and confirmed on repeat testing.  While 2019 novel coronavirus  (SARS-CoV-2) nucleic acids may be present in the submitted sample  additional confirmatory testing may be necessary for epidemiological  and / or clinical management purposes  to differentiate between  SARS-CoV-2 and other Sarbecovirus currently known to infect humans.  If clinically indicated additional testing with an alternate test  methodology [phone]) is advised. The SARS-CoV-2 RNA is generally  detectable in upper and lower respiratory sp ecimens during the acute  phase of infection. The expected result is Negative. Fact Sheet for Patients:  StrictlyIdeas.no Fact Sheet for Healthcare Providers: BankingDealers.co.za This test is not yet approved or cleared by the [place] FDA and has been authorized for detection and/or diagnosis of SARS-CoV-2 by FDA under an Emergency Use Authorization (EUA).  This EUA will remain in effect (meaning this  test can be used) for the duration of the COVID-19 declaration under Section 564(b)(1) of the Act, 21 U.S.C. section 360bbb-3(b)(1), unless the authorization is terminated or revoked sooner. Performed at [facility], [address] [place], [place] [address]   Culture, blood (routine x 2)     Status: None (Preliminary result)   Collection Time: 08/05/18  6:16 AM  Result Value Ref Range Status   Specimen Description BLOOD RIGHT ANTECUBITAL  Final   Special Requests   Final    BOTTLES DRAWN AEROBIC AND ANAEROBIC Blood Culture results may not be optimal due to an excessive volume of blood received in culture bottles   Culture   Final    NO GROWTH 1 DAY Performed at [facility], [address], [place], [place] [address]    Report Status PENDING  Incomplete  Urine Culture     Status: Abnormal   Collection Time: 08/05/18  7:29 AM  Result Value Ref Range Status   Specimen Description   Final    URINE, CLEAN CATCH Performed at [facility], [address], [place], [place] [address]    Special Requests   Final    Normal Performed at [facility], [address] [place], [place] [address]    Culture (A)  Final    40,000 COLONIES/mL MULTIPLE SPECIES PRESENT, SUGGEST RECOLLECTION   Report Status 08/06/2018 FINAL  Final    Radiology Reports Ct Abdomen Pelvis W Contrast  Result Date: 08/04/2018 CLINICAL DATA:  Abdominal pain, radiating toward back EXAM: CT ABDOMEN AND PELVIS WITH CONTRAST TECHNIQUE: Multidetector CT imaging of the abdomen and pelvis was performed using the standard protocol  following bolus administration of intravenous contrast. Oral contrast was also administered. CONTRAST:  19mL OMNIPAQUE IOHEXOL 300 MG/ML  SOLN COMPARISON:  None. FINDINGS: Lower chest: There is atelectatic change in each lung base. There is a small area of apparent pneumonia in the posterior segment right lower lobe. There are foci of coronary artery  calcification. There is oral contrast in the distal esophagus, likely indicative of a degree of spontaneous gastroesophageal reflux. Hepatobiliary: There is hepatic steatosis. No focal liver lesion is appreciable. There appear to be gallstones, potentially intermingled with sludge, within the gallbladder. No appreciable gallbladder wall thickening evident. There is no evident biliary duct dilatation. Pancreas: There is no pancreatic mass or inflammatory focus. Spleen: No splenic lesions are evident. Adrenals/Urinary Tract: Adrenals bilaterally appear unremarkable. Kidneys bilaterally show no evident mass or hydronephrosis on either side. There is no appreciable renal or ureteral calculus on either side. Urinary bladder is midline with wall thickness within normal limits. Stomach/Bowel: There are sigmoid and descending colonic diverticula without demonstrable diverticulitis. There is no appreciable bowel wall or mesenteric thickening. No evident bowel obstruction. Terminal ileum appears normal. There is mild lipomatous infiltration of the ileocecal valve. There is no appreciable free air or portal venous air. No findings suggesting bowel ischemia. Vascular/Lymphatic: There is aortic and bilateral iliac artery atherosclerosis. There is moderate calcification at the origin of the celiac artery. The superior mesenteric artery appears patent. There is no evident adenopathy in the abdomen or pelvis. Reproductive: Uterus is anteverted.  No pelvic mass evident. Other: Appendix appears normal. There is no abscess or ascites in the abdomen or pelvis. There is a minimal ventral hernia containing only fat. There is soft tissue opacification posterior to the paraspinous muscles of the lumbar region slightly deep to the subcutaneous regions, likely due to a degree of panniculitis. There is also subcutaneous edema in this area. Musculoskeletal: There is degenerative change in the lower thoracic and lumbar regions. There is  moderate spinal stenosis at L3-4 due to disc protrusion and bony hypertrophy no intramuscular lesions are evident. IMPRESSION: 1.  Small focus of apparent pneumonia posterior right lung base. 2. Descending colonic and sigmoid diverticulosis without frank diverticulitis. No bowel obstruction. No abscess in the abdomen pelvis. Appendix appears normal. 3. No evident renal or ureteral calculus. No hydronephrosis. Urinary bladder wall thickness normal. 4. Extensive aortic and iliac artery atherosclerosis. There appears to be hemodynamically significant narrowing at the origin of the left common iliac artery based on the degree of calcification present in this area. Foci of coronary artery calcification also noted. 5. Apparent cholelithiasis, likely intermingled with sludge. No gallbladder wall thickening evident by CT. 6.  Hepatic steatosis. 7.  Moderate spinal stenosis at L3-4, multifactorial. 8. 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 Electronically Signed   By: Lowella Grip III M.D.   On: 08/04/2018 10:21   Dg Chest Port 1 View  Result Date: 08/05/2018 CLINICAL DATA:  73 year old female with possible pneumonia in the right costophrenic angle on CT Abdomen and Pelvis yesterday. Persistent abdominal and back pain. EXAM: PORTABLE CHEST 1 VIEW COMPARISON:  CT Abdomen and Pelvis 08/04/2018 and earlier. FINDINGS: Portable AP upright view at 0548 hours. Stable mild cardiomegaly. Other mediastinal contours are within normal limits. Visualized tracheal air column is within normal limits. Allowing for portable technique the lungs are clear. No pneumothorax or pleural effusion. In the visible upper abdomen there is oral contrast  in nondilated transverse colon. No pneumoperitoneum identified. IMPRESSION: 1. No acute cardiopulmonary abnormality by portable x-ray. Suspect right costophrenic angle opacity seen yesterday  was atelectasis. 2. Oral contrast from yesterday in nondilated transverse colon. Electronically Signed   By: Genevie Ann M.D.   On: 08/05/2018 06:27   US Abdomen Limited Ruq  Result Date: 08/04/2018 CLINICAL DATA:  Right upper quadrant pain and gallstones on recent CT EXAM: ULTRASOUND ABDOMEN LIMITED RIGHT UPPER QUADRANT COMPARISON:  CT from earlier in the same day. FINDINGS: Gallbladder: Gallbladder is well distended with multiple stones within. No wall thickening or pericholecystic fluid is noted. Negative sonographic Murphy's sign is noted. Common bile duct: Diameter: 6.2 mm. Liver: Increased in echogenicity consistent with fatty infiltration. No focal mass is seen. Portal vein is patent on color Doppler imaging with normal direction of blood flow towards the liver. IMPRESSION: Fatty liver. Cholelithiasis without complicating factors. Electronically Signed   By: Inez Catalina M.D.   On: 08/04/2018 11:36     CBC Recent Labs  Lab 08/04/18 0628 08/05/18 0559 08/05/18 1249 08/06/18 0530  WBC 9.7  9.7 13.0*  --  12.8*  HGB 15.0  15.0 13.9 13.6 12.7  HCT 44.0  43.9 40.6  --  38.0  PLT 223  214 214  --  172  MCV 84.6  84.3 83.0  --  85.4  MCH 28.8  28.8 28.4  --  28.5  MCHC 34.1  34.2 34.2  --  33.4  RDW 12.9  12.9 12.7  --  12.9  LYMPHSABS 1.2 0.9  --   --   MONOABS 0.5 0.6  --   --   EOSABS 0.2 0.1  --   --   BASOSABS 0.1 0.1  --   --     Chemistries  Recent Labs  Lab 08/04/18 0628 08/05/18 0559 08/06/18 0530  NA 135 129* 136  K 3.3* 3.1* 3.1*  CL 100 92* 102  CO2 24 26 27   GLUCOSE 204* 185* 164*  BUN 17 11 7*  CREATININE 0.82 0.73 0.64  CALCIUM 9.8 8.8* 8.3*  AST 34 55*  --   ALT 32 77*  --   ALKPHOS 82 84  --   BILITOT 0.7 1.1  --    ------------------------------------------------------------------------------------------------------------------ estimated creatinine clearance is 74.6 mL/min (by C-G formula based on SCr of 0.64  mg/dL). ------------------------------------------------------------------------------------------------------------------ No results for input(s): HGBA1C in the last 72 hours. ------------------------------------------------------------------------------------------------------------------ No results for input(s): CHOL, HDL, LDLCALC, TRIG, CHOLHDL, LDLDIRECT in the last 72 hours. ------------------------------------------------------------------------------------------------------------------ No results for input(s): TSH, T4TOTAL, T3FREE, THYROIDAB in the last 72 hours.  Invalid input(s): FREET3 ------------------------------------------------------------------------------------------------------------------ No results for input(s): VITAMINB12, FOLATE, FERRITIN, TIBC, IRON, RETICCTPCT in the last 72 hours.  Coagulation profile Recent Labs  Lab 08/05/18 0559 08/06/18 0530  INR 2.0* 2.4*    No results for input(s): DDIMER in the last 72 hours.  Cardiac Enzymes Recent Labs  Lab 08/05/18 0559  TROPONINI <0.03   ------------------------------------------------------------------------------------------------------------------ Invalid input(s): POCBNP    Assessment & Plan   1.  Upper GI bleed with epigastric abdominal pain and melena.    Due to gastritis change Protonix to IV twice daily discontinue IV Protonix, abdominal pain likely due to gastritis.  Appreciate surgery input 2.  Atrial fibrillation with rapid ventricular response.    Heart rate is now continue current therapy improved 3.  Pneumonia seen on previous CT scan.    Continue IV antibiotics 4.  Hyponatremia and hypokalemia.  Normal saline with potassium IV  fluid supplementation 5.  Acute cystitis.    Urine cultures pending 6.  Breast cancer on Femara treatment 7.  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  Holding anticoagulation currently 8.  Hypertension.  Hold Maxide and continue Cardizem orally.     Code Status Orders   (From admission, onward)         Start     Ordered   08/06/18 1008  Limited resuscitation (code)  Continuous    Question Answer Comment  In the event of cardiac or respiratory ARREST: Initiate Code Blue, Call Rapid Response Yes   In the event of cardiac or respiratory ARREST: Perform CPR Yes   In the event of cardiac or respiratory ARREST: Perform Intubation/Mechanical Ventilation No   In the event of cardiac or respiratory ARREST: Use NIPPV/BiPAp only if indicated Yes   In the event of cardiac or respiratory ARREST: Administer ACLS medications if indicated Yes   In the event of cardiac or respiratory ARREST: Perform Defibrillation or Cardioversion if indicated Yes   Comments Nurse may pronounce      08/06/18 1008        Code Status History    Date Active Date Inactive Code Status Order ID Comments User Context   08/05/2018 0805 08/06/2018 1008 DNR [id number]  Loletha Grayer, MD ED           Consults surgery and GI  DVT Prophylaxis SCDs  Lab Results  Component Value Date   PLT 172 08/06/2018     Time Spent in minutes 35 minutes  Greater than 50% of time spent in care coordination and counseling patient regarding the condition and plan of care.   Dustin Flock M.D on 08/06/2018 at 2:02 PM  Between 7am to 6pm - Pager - [phone]  After 6pm go to [email or web address] - Proofreader  [employer]   Office  [phone]

## 2018-08-07 LAB — CBC
HCT: 38 % (ref 36.0–46.0)
Hemoglobin: 12.4 g/dL (ref 12.0–15.0)
MCH: 29.1 pg (ref 26.0–34.0)
MCHC: 32.6 g/dL (ref 30.0–36.0)
MCV: 89.2 fL (ref 80.0–100.0)
Platelets: 161 10*3/uL (ref 150–400)
RBC: 4.26 MIL/uL (ref 3.87–5.11)
RDW: 13.2 % (ref 11.5–15.5)
WBC: 8.4 10*3/uL (ref 4.0–10.5)
nRBC: 0 % (ref 0.0–0.2)

## 2018-08-07 LAB — BASIC METABOLIC PANEL
Anion gap: 7 (ref 5–15)
BUN: 8 mg/dL (ref 8–23)
CO2: 24 mmol/L (ref 22–32)
Calcium: 8.5 mg/dL — ABNORMAL LOW (ref 8.9–10.3)
Chloride: 109 mmol/L (ref 98–111)
Creatinine, Ser: 0.64 mg/dL (ref 0.44–1.00)
GFR calc Af Amer: >60 mL/min (ref >60)
GFR calc non Af Amer: >60 mL/min (ref >60)
Glucose, Bld: 125 mg/dL — ABNORMAL HIGH (ref 70–99)
Potassium: 4.1 mmol/L (ref 3.5–5.1)
Sodium: 140 mmol/L (ref 135–145)

## 2018-08-07 NOTE — Progress Notes (Signed)
I will sign off.  Please call me if any further GI concerns or questions.  We would like to thank you for the opportunity to participate in the care of Stacy Reese.   Dr Jonathon Bellows MD,MRCP [facility]) Gastroenterology/Hepatology Pager: [phone]

## 2018-08-07 NOTE — Progress Notes (Signed)
[place] at [facility]                                                                                                                                                                                  Patient Demographics   Stacy Reese, is a 73 y.o. female, DOB - [birth year], ELF:[id number]  Admit date - 08/05/2018   Admitting Physician Loletha Grayer, MD  Outpatient Primary MD for the patient is Kirk Ruths, MD   LOS - 2  Subjective: She has abdominal pain is resolved     Review of Systems:   CONSTITUTIONAL: No documented fever. No fatigue, weakness. No weight gain, no weight loss.  EYES: No blurry or double vision.  ENT: No tinnitus. No postnasal drip. No redness of the oropharynx.  RESPIRATORY: No cough, no wheeze, no hemoptysis. No dyspnea.  CARDIOVASCULAR: No chest pain. No orthopnea. No palpitations. No syncope.  GASTROINTESTINAL: No nausea, no vomiting or diarrhea.  Abdominal pain resolved. No melena or hematochezia.  GENITOURINARY: No dysuria or hematuria.  ENDOCRINE: No polyuria or nocturia. No heat or cold intolerance.  HEMATOLOGY: No anemia. No bruising. No bleeding.  INTEGUMENTARY: No rashes. No lesions.  MUSCULOSKELETAL: No arthritis. No swelling. No gout.  NEUROLOGIC: No numbness, tingling, or ataxia. No seizure-type activity.  PSYCHIATRIC: No anxiety. No insomnia. No ADD.    Vitals:   Vitals:   08/06/18 1726 08/06/18 1932 08/07/18 0814 08/07/18 1304  BP: 118/83 99/75 127/85 107/75  Pulse: 97 96 86 87  Resp: 18 18 18 18   Temp: 98.6 F (37 C) 99.8 F (37.7 C) 98 F (36.7 C) 98.4 F (36.9 C)  TempSrc: Oral Oral Oral Oral  SpO2: 93% 98% 97% 97%  Weight:      Height:        Wt Readings from Last 3 Encounters:  08/05/18 95.1 kg  08/04/18 90.7 kg  12/28/17 95.9 kg     Intake/Output Summary (Last 24 hours) at 08/07/2018 1350 Last data filed at 08/07/2018 1300 Gross per 24 hour  Intake 1299.5 ml  Output 300 ml  Net  999.5 ml    Physical Exam:   GENERAL: Pleasant-appearing in no apparent distress.  HEAD, EYES, EARS, NOSE AND THROAT: Atraumatic, normocephalic. Extraocular muscles are intact. Pupils equal and reactive to light. Sclerae anicteric. No conjunctival injection. No oro-pharyngeal erythema.  NECK: Supple. There is no jugular venous distention. No bruits, no lymphadenopathy, no thyromegaly.  HEART: Regular rate and rhythm,. No murmurs, no rubs, no clicks.  LUNGS: Clear to auscultation bilaterally. No rales or rhonchi. No wheezes.  ABDOMEN: Soft, flat, nontender, nondistended. Has good bowel sounds. No hepatosplenomegaly appreciated.  EXTREMITIES: No  evidence of any cyanosis, clubbing, or peripheral edema.  +2 pedal and radial pulses bilaterally.  NEUROLOGIC: The patient is alert, awake, and oriented x3 with no focal motor or sensory deficits appreciated bilaterally.  SKIN: Moist and warm with no rashes appreciated.  Psych: Not anxious, depressed LN: No inguinal LN enlargement    Antibiotics   Anti-infectives (From admission, onward)   Start     Dose/Rate Route Frequency Ordered Stop   08/06/18 1000  azithromycin (ZITHROMAX) tablet 250 mg     250 mg Oral Daily 08/05/18 0806     08/06/18 0800  cefTRIAXone (ROCEPHIN) 2 g in sodium chloride 0.9 % 100 mL IVPB  Status:  Discontinued     2 g 200 mL/hr over 30 Minutes Intravenous Every 24 hours 08/05/18 0806 08/07/18 0941   08/05/18 0600  cefTRIAXone (ROCEPHIN) 1 g in sodium chloride 0.9 % 100 mL IVPB     1 g 200 mL/hr over 30 Minutes Intravenous  Once 08/05/18 0547 08/05/18 0720   08/05/18 0600  azithromycin (ZITHROMAX) 500 mg in sodium chloride 0.9 % 250 mL IVPB     500 mg 250 mL/hr over 60 Minutes Intravenous  Once 08/05/18 0547 08/05/18 0735      Medications   Scheduled Meds: . azithromycin  250 mg Oral Daily  . buPROPion  150 mg Oral Daily  . busPIRone  10 mg Oral Daily  . citalopram  20 mg Oral Daily  . diltiazem  240 mg Oral Daily   . docusate sodium  200 mg Oral BID  . letrozole  2.5 mg Oral Daily  . pantoprazole (PROTONIX) IV  40 mg Intravenous Q12H  . polyethylene glycol  17 g Oral Daily  . pravastatin  40 mg Oral Daily  . senna  1 tablet Oral Daily  . simethicone  80 mg Oral QID   Continuous Infusions: . 0.9 % NaCl with KCl 20 mEq / L 75 mL/hr at 08/07/18 0620   PRN Meds:.acetaminophen **OR** acetaminophen, diltiazem, HYDROcodone-acetaminophen, LORazepam, ondansetron **OR** ondansetron (ZOFRAN) IV   Data Review:   Micro Results Recent Results (from the past 240 hour(s))  Culture, blood (routine x 2)     Status: None (Preliminary result)   Collection Time: 08/05/18  5:59 AM  Result Value Ref Range Status   Specimen Description BLOOD BLOOD RIGHT FOREARM  Final   Special Requests   Final    BOTTLES DRAWN AEROBIC AND ANAEROBIC Blood Culture results may not be optimal due to an excessive volume of blood received in culture bottles   Culture   Final    NO GROWTH 2 DAYS Performed at [facility], [address] [place], [place] [address]    Report Status PENDING  Incomplete  SARS Coronavirus 2 (CEPHEID - Performed in [place] hospital lab), Hosp Order     Status: None   Collection Time: 08/05/18  6:15 AM  Result Value Ref Range Status   SARS Coronavirus 2 NEGATIVE NEGATIVE Final    Comment: (NOTE) If result is NEGATIVE SARS-CoV-2 target nucleic acids are NOT DETECTED. The SARS-CoV-2 RNA is generally detectable in upper and lower  respiratory specimens during the acute phase of infection. The lowest  concentration of SARS-CoV-2 viral copies this assay can detect is 250  copies / mL. A negative result does not preclude SARS-CoV-2 infection  and should not be used as the sole basis for treatment or other  patient management decisions.  A negative result may occur with  improper specimen  collection / handling, submission of specimen other  than nasopharyngeal swab, presence of viral  mutation(s) within the  areas targeted by this assay, and inadequate number of viral copies  (<250 copies / mL). A negative result must be combined with clinical  observations, patient history, and epidemiological information. If result is POSITIVE SARS-CoV-2 target nucleic acids are DETECTED. The SARS-CoV-2 RNA is generally detectable in upper and lower  respiratory specimens dur ing the acute phase of infection.  Positive  results are indicative of active infection with SARS-CoV-2.  Clinical  correlation with patient history and other diagnostic information is  necessary to determine patient infection status.  Positive results do  not rule out bacterial infection or co-infection with other viruses. If result is PRESUMPTIVE POSTIVE SARS-CoV-2 nucleic acids MAY BE PRESENT.   A presumptive positive result was obtained on the submitted specimen  and confirmed on repeat testing.  While 2019 novel coronavirus  (SARS-CoV-2) nucleic acids may be present in the submitted sample  additional confirmatory testing may be necessary for epidemiological  and / or clinical management purposes  to differentiate between  SARS-CoV-2 and other Sarbecovirus currently known to infect humans.  If clinically indicated additional testing with an alternate test  methodology [phone]) is advised. The SARS-CoV-2 RNA is generally  detectable in upper and lower respiratory sp ecimens during the acute  phase of infection. The expected result is Negative. Fact Sheet for Patients:  StrictlyIdeas.no Fact Sheet for Healthcare Providers: BankingDealers.co.za This test is not yet approved or cleared by the [place] FDA and has been authorized for detection and/or diagnosis of SARS-CoV-2 by FDA under an Emergency Use Authorization (EUA).  This EUA will remain in effect (meaning this test can be used) for the duration of the COVID-19 declaration under Section 564(b)(1)  of the Act, 21 U.S.C. section 360bbb-3(b)(1), unless the authorization is terminated or revoked sooner. Performed at [facility], [address] [place], [place] [address]   Culture, blood (routine x 2)     Status: None (Preliminary result)   Collection Time: 08/05/18  6:16 AM  Result Value Ref Range Status   Specimen Description BLOOD RIGHT ANTECUBITAL  Final   Special Requests   Final    BOTTLES DRAWN AEROBIC AND ANAEROBIC Blood Culture results may not be optimal due to an excessive volume of blood received in culture bottles   Culture   Final    NO GROWTH 2 DAYS Performed at [facility], [address], [place], [place] [address]    Report Status PENDING  Incomplete  Urine Culture     Status: Abnormal   Collection Time: 08/05/18  7:29 AM  Result Value Ref Range Status   Specimen Description   Final    URINE, CLEAN CATCH Performed at [facility], [address] [place], [place] [address]    Special Requests   Final    Normal Performed at [facility], [address] [place], [place] [address]    Culture (A)  Final    40,000 COLONIES/mL MULTIPLE SPECIES PRESENT, SUGGEST RECOLLECTION   Report Status 08/06/2018 FINAL  Final    Radiology Reports Ct Abdomen Pelvis W Contrast  Result Date: 08/04/2018 CLINICAL DATA:  Abdominal pain, radiating toward back EXAM: CT ABDOMEN AND PELVIS WITH CONTRAST TECHNIQUE: Multidetector CT imaging of the abdomen and pelvis was performed using the standard protocol following bolus administration of intravenous contrast. Oral contrast was also administered. CONTRAST:  117mL OMNIPAQUE IOHEXOL 300 MG/ML  SOLN  COMPARISON:  None. FINDINGS: Lower chest: There is atelectatic change in each lung base. There is a small area of apparent pneumonia in the posterior segment right lower lobe. There are foci of coronary artery calcification. There is oral contrast in the distal esophagus, likely indicative of a degree of  spontaneous gastroesophageal reflux. Hepatobiliary: There is hepatic steatosis. No focal liver lesion is appreciable. There appear to be gallstones, potentially intermingled with sludge, within the gallbladder. No appreciable gallbladder wall thickening evident. There is no evident biliary duct dilatation. Pancreas: There is no pancreatic mass or inflammatory focus. Spleen: No splenic lesions are evident. Adrenals/Urinary Tract: Adrenals bilaterally appear unremarkable. Kidneys bilaterally show no evident mass or hydronephrosis on either side. There is no appreciable renal or ureteral calculus on either side. Urinary bladder is midline with wall thickness within normal limits. Stomach/Bowel: There are sigmoid and descending colonic diverticula without demonstrable diverticulitis. There is no appreciable bowel wall or mesenteric thickening. No evident bowel obstruction. Terminal ileum appears normal. There is mild lipomatous infiltration of the ileocecal valve. There is no appreciable free air or portal venous air. No findings suggesting bowel ischemia. Vascular/Lymphatic: There is aortic and bilateral iliac artery atherosclerosis. There is moderate calcification at the origin of the celiac artery. The superior mesenteric artery appears patent. There is no evident adenopathy in the abdomen or pelvis. Reproductive: Uterus is anteverted.  No pelvic mass evident. Other: Appendix appears normal. There is no abscess or ascites in the abdomen or pelvis. There is a minimal ventral hernia containing only fat. There is soft tissue opacification posterior to the paraspinous muscles of the lumbar region slightly deep to the subcutaneous regions, likely due to a degree of panniculitis. There is also subcutaneous edema in this area. Musculoskeletal: There is degenerative change in the lower thoracic and lumbar regions. There is moderate spinal stenosis at L3-4 due to disc protrusion and bony hypertrophy no intramuscular lesions  are evident. IMPRESSION: 1.  Small focus of apparent pneumonia posterior right lung base. 2. Descending colonic and sigmoid diverticulosis without frank diverticulitis. No bowel obstruction. No abscess in the abdomen pelvis. Appendix appears normal. 3. No evident renal or ureteral calculus. No hydronephrosis. Urinary bladder wall thickness normal. 4. Extensive aortic and iliac artery atherosclerosis. There appears to be hemodynamically significant narrowing at the origin of the left common iliac artery based on the degree of calcification present in this area. Foci of coronary artery calcification also noted. 5. Apparent cholelithiasis, likely intermingled with sludge. No gallbladder wall thickening evident by CT. 6.  Hepatic steatosis. 7.  Moderate spinal stenosis at L3-4, multifactorial. 8. 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 Electronically Signed   By: Lowella Grip III M.D.   On: 08/04/2018 10:21   Dg Chest Port 1 View  Result Date: 08/05/2018 CLINICAL DATA:  73 year old female with possible pneumonia in the right costophrenic angle on CT Abdomen and Pelvis yesterday. Persistent abdominal and back pain. EXAM: PORTABLE CHEST 1 VIEW COMPARISON:  CT Abdomen and Pelvis 08/04/2018 and earlier. FINDINGS: Portable AP upright view at 0548 hours. Stable mild cardiomegaly. Other mediastinal contours are within normal limits. Visualized tracheal air column is within normal limits. Allowing for portable technique the lungs are clear. No pneumothorax or pleural effusion. In the visible upper abdomen there is oral contrast in nondilated transverse colon. No pneumoperitoneum identified. IMPRESSION: 1. No acute cardiopulmonary abnormality by portable x-ray. Suspect right costophrenic angle  opacity seen yesterday was atelectasis. 2. Oral contrast from yesterday in nondilated transverse colon. Electronically  Signed   By: Genevie Ann M.D.   On: 08/05/2018 06:27   US Abdomen Limited Ruq  Result Date: 08/04/2018 CLINICAL DATA:  Right upper quadrant pain and gallstones on recent CT EXAM: ULTRASOUND ABDOMEN LIMITED RIGHT UPPER QUADRANT COMPARISON:  CT from earlier in the same day. FINDINGS: Gallbladder: Gallbladder is well distended with multiple stones within. No wall thickening or pericholecystic fluid is noted. Negative sonographic Murphy's sign is noted. Common bile duct: Diameter: 6.2 mm. Liver: Increased in echogenicity consistent with fatty infiltration. No focal mass is seen. Portal vein is patent on color Doppler imaging with normal direction of blood flow towards the liver. IMPRESSION: Fatty liver. Cholelithiasis without complicating factors. Electronically Signed   By: Inez Catalina M.D.   On: 08/04/2018 11:36     CBC Recent Labs  Lab 08/04/18 0628 08/05/18 0559 08/05/18 1249 08/06/18 0530 08/07/18 0532  WBC 9.7  9.7 13.0*  --  12.8* 8.4  HGB 15.0  15.0 13.9 13.6 12.7 12.4  HCT 44.0  43.9 40.6  --  38.0 38.0  PLT 223  214 214  --  172 161  MCV 84.6  84.3 83.0  --  85.4 89.2  MCH 28.8  28.8 28.4  --  28.5 29.1  MCHC 34.1  34.2 34.2  --  33.4 32.6  RDW 12.9  12.9 12.7  --  12.9 13.2  LYMPHSABS 1.2 0.9  --   --   --   MONOABS 0.5 0.6  --   --   --   EOSABS 0.2 0.1  --   --   --   BASOSABS 0.1 0.1  --   --   --     Chemistries  Recent Labs  Lab 08/04/18 0628 08/05/18 0559 08/06/18 0530 08/07/18 0532  NA 135 129* 136 140  K 3.3* 3.1* 3.1* 4.1  CL 100 92* 102 109  CO2 24 26 27 24   GLUCOSE 204* 185* 164* 125*  BUN 17 11 7* 8  CREATININE 0.82 0.73 0.64 0.64  CALCIUM 9.8 8.8* 8.3* 8.5*  AST 34 55*  --   --   ALT 32 77*  --   --   ALKPHOS 82 84  --   --   BILITOT 0.7 1.1  --   --    ------------------------------------------------------------------------------------------------------------------ estimated creatinine clearance is 74.6 mL/min (by C-G formula based on SCr of  0.64 mg/dL). ------------------------------------------------------------------------------------------------------------------ No results for input(s): HGBA1C in the last 72 hours. ------------------------------------------------------------------------------------------------------------------ No results for input(s): CHOL, HDL, LDLCALC, TRIG, CHOLHDL, LDLDIRECT in the last 72 hours. ------------------------------------------------------------------------------------------------------------------ No results for input(s): TSH, T4TOTAL, T3FREE, THYROIDAB in the last 72 hours.  Invalid input(s): FREET3 ------------------------------------------------------------------------------------------------------------------ No results for input(s): VITAMINB12, FOLATE, FERRITIN, TIBC, IRON, RETICCTPCT in the last 72 hours.  Coagulation profile Recent Labs  Lab 08/05/18 0559 08/06/18 0530  INR 2.0* 2.4*    No results for input(s): DDIMER in the last 72 hours.  Cardiac Enzymes Recent Labs  Lab 08/05/18 0559  TROPONINI <0.03   ------------------------------------------------------------------------------------------------------------------ Invalid input(s): POCBNP    Assessment & Plan   1.  Upper GI bleed with epigastric abdominal pain and melena.    Patient's abdominal pain is now resolved likely related to gas and constipation advance diet 2.  Atrial fibrillation with rapid ventricular response.    Heart rate is now normal continue current therapy improved 3.  Pneumonia seen on previous CT scan.  Continue IV antibiotics 4.  Hyponatremia and hypokalemia.    Resolved 5.  Acute cystitis.    Urine cultures pending 6.  Breast cancer on Femara treatment 7.  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  Resume anticoagulation 8.  Hypertension.  Hold Maxide and continue Cardizem orally.     Code Status Orders  (From admission, onward)         Start     Ordered   08/06/18 1008  Limited  resuscitation (code)  Continuous    Question Answer Comment  In the event of cardiac or respiratory ARREST: Initiate Code Blue, Call Rapid Response Yes   In the event of cardiac or respiratory ARREST: Perform CPR Yes   In the event of cardiac or respiratory ARREST: Perform Intubation/Mechanical Ventilation No   In the event of cardiac or respiratory ARREST: Use NIPPV/BiPAp only if indicated Yes   In the event of cardiac or respiratory ARREST: Administer ACLS medications if indicated Yes   In the event of cardiac or respiratory ARREST: Perform Defibrillation or Cardioversion if indicated Yes   Comments Nurse may pronounce      08/06/18 1008        Code Status History    Date Active Date Inactive Code Status Order ID Comments User Context   08/05/2018 0805 08/06/2018 1008 DNR [id number]  Loletha Grayer, MD ED           Consults surgery and GI  DVT Prophylaxis SCDs  Lab Results  Component Value Date   PLT 161 08/07/2018     Time Spent in minutes 35 minutes  Greater than 50% of time spent in care coordination and counseling patient regarding the condition and plan of care.   Dustin Flock M.D on 08/07/2018 at 1:50 PM  Between 7am to 6pm - Pager - [phone]  After 6pm go to [email or web address] - Proofreader  [employer]   Office  [phone]

## 2018-08-07 NOTE — Progress Notes (Signed)
Patient has rested quietly today. Refusing all stool softeners and laxatives as she has had diarrhea since taking laxatives yesterday. Bowel movements have slowed down. No complaints this shift. Patient reports feeling much better. She tolerated solids for lunch and is hoping she will be discharged tomorrow.

## 2018-08-08 ENCOUNTER — Encounter: Payer: Self-pay | Admitting: Gastroenterology

## 2018-08-08 LAB — BASIC METABOLIC PANEL
Anion gap: 8 (ref 5–15)
BUN: 14 mg/dL (ref 8–23)
CO2: 23 mmol/L (ref 22–32)
Calcium: 8.6 mg/dL — ABNORMAL LOW (ref 8.9–10.3)
Chloride: 109 mmol/L (ref 98–111)
Creatinine, Ser: 0.75 mg/dL (ref 0.44–1.00)
GFR calc Af Amer: >60 mL/min (ref >60)
GFR calc non Af Amer: >60 mL/min (ref >60)
Glucose, Bld: 126 mg/dL — ABNORMAL HIGH (ref 70–99)
Potassium: 4.2 mmol/L (ref 3.5–5.1)
Sodium: 140 mmol/L (ref 135–145)

## 2018-08-08 LAB — CBC
HCT: 37.2 % (ref 36.0–46.0)
Hemoglobin: 11.9 g/dL — ABNORMAL LOW (ref 12.0–15.0)
MCH: 28.8 pg (ref 26.0–34.0)
MCHC: 32 g/dL (ref 30.0–36.0)
MCV: 90.1 fL (ref 80.0–100.0)
Platelets: 187 10*3/uL (ref 150–400)
RBC: 4.13 MIL/uL (ref 3.87–5.11)
RDW: 13.4 % (ref 11.5–15.5)
WBC: 8.7 10*3/uL (ref 4.0–10.5)
nRBC: 0 % (ref 0.0–0.2)

## 2018-08-08 LAB — PROTIME-INR
INR: 1.6 — ABNORMAL HIGH (ref 0.8–1.2)
Prothrombin Time: 18.8 seconds — ABNORMAL HIGH (ref 11.4–15.2)

## 2018-08-08 MED ORDER — ALUM & MAG HYDROXIDE-SIMETH 200-200-20 MG/5ML PO SUSP: 30.0000 mL | Freq: Four times a day (QID) | ORAL | 0 refills | Status: DC | PRN

## 2018-08-08 MED ORDER — PANTOPRAZOLE SODIUM 40 MG PO TBEC: 40.0000 mg | DELAYED_RELEASE_TABLET | Freq: Every day | ORAL | 11 refills | Status: AC

## 2018-08-08 MED ORDER — POLYETHYLENE GLYCOL 3350 17 G PO PACK: 17.0000 g | PACK | Freq: Every day | ORAL | 0 refills | Status: AC

## 2018-08-08 MED ORDER — ALUM & MAG HYDROXIDE-SIMETH 200-200-20 MG/5ML PO SUSP: 30.0000 mL | Freq: Four times a day (QID) | ORAL | Status: DC | PRN

## 2018-08-08 NOTE — Discharge Summary (Signed)
[facility], 73 y.o., DOB 02-05-1946, MRN [id number]. Admission date: 08/05/2018 Discharge Date 08/08/2018 Primary MD Kirk Ruths, MD Admitting Physician Loletha Grayer, MD  Admission Diagnosis  Epigastric pain [R10.13] Atrial fibrillation with rapid ventricular response (HCC) [I48.91] Calculus of gallbladder without cholecystitis without obstruction [K80.20] Gastrointestinal hemorrhage, unspecified gastrointestinal hemorrhage type [K92.2]  Discharge Diagnosis   Active Problems: Upper GI bleed due to gastritis Abdominal pain due to gas/constipation Atrial fibrillation with rapid ventricular rate Possible community-acquired pneumonia Hyponatremia Cystitis Breast cancer History of DVT and PE Hypertension       Hospital Course  Dilpreet Faires  is a 73 y.o. female presents back to the emergency room today.  For the past week she has been having bad stomach pain she points in her epigastric area.  She has been having black stools about 2 times per day.  Had some nausea vomiting today.  She states the pain is 8 out of 10 in intensity which wraps around to her back but not the center of her back.  She feels hot.  She feels horrible.  She feels weak.  In the ER, she was found to be hyponatremic and in rapid atrial fibrillation.  Hemoglobin did come down from yesterday from 15 down to 13.9.  ER physician placed the patient on a Protonix drip.  Patient was seen by GI and underwent EGD which showed some gastritis.  Patient also had a CT scan of the abdomen which showed gallstones without cholecystitis.  Was seen by surgery.  She was treated with antiemetics stool softeners and simethicone.  With these treatments her abdominal pain resolved.  She is doing much better hemoglobin is stable.  There is no further evidence of bleed.  Patient's Coumadin has been resumed.  Patient had an abnormal CT scan suggestive of possible small pneumonia  therefore she was treated with antibiotics.            Consults  GI  Significant Tests:  See full reports for all details    Ct Abdomen Pelvis W Contrast  Result Date: 08/04/2018 CLINICAL DATA:  Abdominal pain, radiating toward back EXAM: CT ABDOMEN AND PELVIS WITH CONTRAST TECHNIQUE: Multidetector CT imaging of the abdomen and pelvis was performed using the standard protocol following bolus administration of intravenous contrast. Oral contrast was also administered. CONTRAST:  190mL OMNIPAQUE IOHEXOL 300 MG/ML  SOLN COMPARISON:  None. FINDINGS: Lower chest: There is atelectatic change in each lung base. There is a small area of apparent pneumonia in the posterior segment right lower lobe. There are foci of coronary artery calcification. There is oral contrast in the distal esophagus, likely indicative of a degree of spontaneous gastroesophageal reflux. Hepatobiliary: There is hepatic steatosis. No focal liver lesion is appreciable. There appear to be gallstones, potentially intermingled with sludge, within the gallbladder. No appreciable gallbladder wall thickening evident. There is no evident biliary duct dilatation. Pancreas: There is no pancreatic mass or inflammatory focus. Spleen: No splenic lesions are evident. Adrenals/Urinary Tract: Adrenals bilaterally appear unremarkable. Kidneys bilaterally show no evident mass or hydronephrosis on either side. There is no appreciable renal or ureteral calculus on either side. Urinary bladder is midline with wall thickness within normal limits. Stomach/Bowel: There are sigmoid and descending colonic diverticula without demonstrable diverticulitis. There is no appreciable bowel wall or mesenteric thickening. No evident bowel obstruction. Terminal ileum appears normal. There is mild lipomatous infiltration of the ileocecal valve. There is no appreciable free air  or portal venous air. No findings suggesting bowel ischemia. Vascular/Lymphatic: There is  aortic and bilateral iliac artery atherosclerosis. There is moderate calcification at the origin of the celiac artery. The superior mesenteric artery appears patent. There is no evident adenopathy in the abdomen or pelvis. Reproductive: Uterus is anteverted.  No pelvic mass evident. Other: Appendix appears normal. There is no abscess or ascites in the abdomen or pelvis. There is a minimal ventral hernia containing only fat. There is soft tissue opacification posterior to the paraspinous muscles of the lumbar region slightly deep to the subcutaneous regions, likely due to a degree of panniculitis. There is also subcutaneous edema in this area. Musculoskeletal: There is degenerative change in the lower thoracic and lumbar regions. There is moderate spinal stenosis at L3-4 due to disc protrusion and bony hypertrophy no intramuscular lesions are evident. IMPRESSION: 1.  Small focus of apparent pneumonia posterior right lung base. 2. Descending colonic and sigmoid diverticulosis without frank diverticulitis. No bowel obstruction. No abscess in the abdomen pelvis. Appendix appears normal. 3. No evident renal or ureteral calculus. No hydronephrosis. Urinary bladder wall thickness normal. 4. Extensive aortic and iliac artery atherosclerosis. There appears to be hemodynamically significant narrowing at the origin of the left common iliac artery based on the degree of calcification present in this area. Foci of coronary artery calcification also noted. 5. Apparent cholelithiasis, likely intermingled with sludge. No gallbladder wall thickening evident by CT. 6.  Hepatic steatosis. 7.  Moderate spinal stenosis at L3-4, multifactorial. 8. 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 Electronically Signed   By: Lowella Grip III M.D.   On: 08/04/2018 10:21   Dg Chest Port 1 View  Result Date: 08/05/2018 CLINICAL DATA:   73 year old female with possible pneumonia in the right costophrenic angle on CT Abdomen and Pelvis yesterday. Persistent abdominal and back pain. EXAM: PORTABLE CHEST 1 VIEW COMPARISON:  CT Abdomen and Pelvis 08/04/2018 and earlier. FINDINGS: Portable AP upright view at 0548 hours. Stable mild cardiomegaly. Other mediastinal contours are within normal limits. Visualized tracheal air column is within normal limits. Allowing for portable technique the lungs are clear. No pneumothorax or pleural effusion. In the visible upper abdomen there is oral contrast in nondilated transverse colon. No pneumoperitoneum identified. IMPRESSION: 1. No acute cardiopulmonary abnormality by portable x-ray. Suspect right costophrenic angle opacity seen yesterday was atelectasis. 2. Oral contrast from yesterday in nondilated transverse colon. Electronically Signed   By: Genevie Ann M.D.   On: 08/05/2018 06:27   US Abdomen Limited Ruq  Result Date: 08/04/2018 CLINICAL DATA:  Right upper quadrant pain and gallstones on recent CT EXAM: ULTRASOUND ABDOMEN LIMITED RIGHT UPPER QUADRANT COMPARISON:  CT from earlier in the same day. FINDINGS: Gallbladder: Gallbladder is well distended with multiple stones within. No wall thickening or pericholecystic fluid is noted. Negative sonographic Murphy's sign is noted. Common bile duct: Diameter: 6.2 mm. Liver: Increased in echogenicity consistent with fatty infiltration. No focal mass is seen. Portal vein is patent on color Doppler imaging with normal direction of blood flow towards the liver. IMPRESSION: Fatty liver. Cholelithiasis without complicating factors. Electronically Signed   By: Inez Catalina M.D.   On: 08/04/2018 11:36       Today   Subjective:   Suzy Bouchard patient denies any complaints feeling much better no abdominal pain no melena.  Objective:   Blood pressure (!) 141/89, pulse 87, temperature 97.9  F (36.6 C), temperature source Oral, resp. rate 19, height 5\' 5"  (1.651 m),  weight 99.5 kg, SpO2 98 %.  .  Intake/Output Summary (Last 24 hours) at 08/08/2018 1352 Last data filed at 08/08/2018 1005 Gross per 24 hour  Intake 1296.21 ml  Output -  Net 1296.21 ml    Exam VITAL SIGNS: Blood pressure (!) 141/89, pulse 87, temperature 97.9 F (36.6 C), temperature source Oral, resp. rate 19, height 5\' 5"  (1.651 m), weight 99.5 kg, SpO2 98 %.  GENERAL:  73 y.o.-year-old patient lying in the bed with no acute distress.  EYES: Pupils equal, round, reactive to light and accommodation. No scleral icterus. Extraocular muscles intact.  HEENT: Head atraumatic, normocephalic. Oropharynx and nasopharynx clear.  NECK:  Supple, no jugular venous distention. No thyroid enlargement, no tenderness.  LUNGS: Normal breath sounds bilaterally, no wheezing, rales,rhonchi or crepitation. No use of accessory muscles of respiration.  CARDIOVASCULAR: S1, S2 normal. No murmurs, rubs, or gallops.  ABDOMEN: Soft, nontender, nondistended. Bowel sounds present. No organomegaly or mass.  EXTREMITIES: No pedal edema, cyanosis, or clubbing.  NEUROLOGIC: Cranial nerves II through XII are intact. Muscle strength 5/5 in all extremities. Sensation intact. Gait not checked.  PSYCHIATRIC: The patient is alert and oriented x 3.  SKIN: No obvious rash, lesion, or ulcer.   Data Review     CBC w Diff:  Lab Results  Component Value Date   WBC 8.7 08/08/2018   HGB 11.9 (L) 08/08/2018   HGB 15.0 02/20/2014   HCT 37.2 08/08/2018   HCT 44.8 02/20/2014   PLT 187 08/08/2018   PLT 208 02/20/2014   LYMPHOPCT 7 08/05/2018   LYMPHOPCT 13.7 02/20/2014   MONOPCT 5 08/05/2018   MONOPCT 6.8 02/20/2014   EOSPCT 1 08/05/2018   EOSPCT 1.8 02/20/2014   BASOPCT 0 08/05/2018   BASOPCT 0.9 02/20/2014   CMP:  Lab Results  Component Value Date   NA 140 08/08/2018   NA 132 (L) 11/16/2013   K 4.2 08/08/2018   K 3.3 (L) 11/16/2013   CL 109 08/08/2018   CL 92 (L) 11/16/2013   CO2 23 08/08/2018   CO2 25  11/16/2013   BUN 14 08/08/2018   BUN 14 11/16/2013   CREATININE 0.75 08/08/2018   CREATININE 1.19 11/16/2013   PROT 7.3 08/05/2018   PROT 7.6 11/16/2013   ALBUMIN 3.9 08/05/2018   ALBUMIN 3.1 (L) 11/16/2013   BILITOT 1.1 08/05/2018   BILITOT 1.0 11/16/2013   ALKPHOS 84 08/05/2018   ALKPHOS 80 11/16/2013   AST 55 (H) 08/05/2018   AST 34 11/16/2013   ALT 77 (H) 08/05/2018   ALT 42 11/16/2013  .  Micro Results Recent Results (from the past 240 hour(s))  Culture, blood (routine x 2)     Status: None (Preliminary result)   Collection Time: 08/05/18  5:59 AM  Result Value Ref Range Status   Specimen Description BLOOD BLOOD RIGHT FOREARM  Final   Special Requests   Final    BOTTLES DRAWN AEROBIC AND ANAEROBIC Blood Culture results may not be optimal due to an excessive volume of blood received in culture bottles   Culture   Final    NO GROWTH 3 DAYS Performed at [facility], [address], [place], [place] [address]    Report Status PENDING  Incomplete  SARS Coronavirus 2 (CEPHEID - Performed in [place] hospital lab), Hosp Order     Status: None   Collection Time: 08/05/18  6:15 AM  Result Value Ref Range Status   SARS Coronavirus 2 NEGATIVE NEGATIVE Final    Comment: (NOTE) If result is NEGATIVE SARS-CoV-2 target nucleic acids are NOT DETECTED. The SARS-CoV-2 RNA is generally detectable in upper and lower  respiratory specimens during the acute phase of infection. The lowest  concentration of SARS-CoV-2 viral copies this assay can detect is 250  copies / mL. A negative result does not preclude SARS-CoV-2 infection  and should not be used as the sole basis for treatment or other  patient management decisions.  A negative result may occur with  improper specimen collection / handling, submission of specimen other  than nasopharyngeal swab, presence of viral mutation(s) within the  areas targeted by this assay, and inadequate number of viral copies  (<250 copies  / mL). A negative result must be combined with clinical  observations, patient history, and epidemiological information. If result is POSITIVE SARS-CoV-2 target nucleic acids are DETECTED. The SARS-CoV-2 RNA is generally detectable in upper and lower  respiratory specimens dur ing the acute phase of infection.  Positive  results are indicative of active infection with SARS-CoV-2.  Clinical  correlation with patient history and other diagnostic information is  necessary to determine patient infection status.  Positive results do  not rule out bacterial infection or co-infection with other viruses. If result is PRESUMPTIVE POSTIVE SARS-CoV-2 nucleic acids MAY BE PRESENT.   A presumptive positive result was obtained on the submitted specimen  and confirmed on repeat testing.  While 2019 novel coronavirus  (SARS-CoV-2) nucleic acids may be present in the submitted sample  additional confirmatory testing may be necessary for epidemiological  and / or clinical management purposes  to differentiate between  SARS-CoV-2 and other Sarbecovirus currently known to infect humans.  If clinically indicated additional testing with an alternate test  methodology [id number]) is advised. The SARS-CoV-2 RNA is generally  detectable in upper and lower respiratory sp ecimens during the acute  phase of infection. The expected result is Negative. Fact Sheet for Patients:  StrictlyIdeas.no Fact Sheet for Healthcare Providers: BankingDealers.co.za This test is not yet approved or cleared by the [place] FDA and has been authorized for detection and/or diagnosis of SARS-CoV-2 by FDA under an Emergency Use Authorization (EUA).  This EUA will remain in effect (meaning this test can be used) for the duration of the COVID-19 declaration under Section 564(b)(1) of the Act, 21 U.S.C. section 360bbb-3(b)(1), unless the authorization is terminated or revoked  sooner. Performed at [facility], [address] [place], [place] [address]   Culture, blood (routine x 2)     Status: None (Preliminary result)   Collection Time: 08/05/18  6:16 AM  Result Value Ref Range Status   Specimen Description BLOOD RIGHT ANTECUBITAL  Final   Special Requests   Final    BOTTLES DRAWN AEROBIC AND ANAEROBIC Blood Culture results may not be optimal due to an excessive volume of blood received in culture bottles   Culture   Final    NO GROWTH 3 DAYS Performed at [facility], [address], [place], [place] [address]    Report Status PENDING  Incomplete  Urine Culture     Status: Abnormal   Collection Time: 08/05/18  7:29 AM  Result Value Ref Range Status   Specimen Description   Final    URINE, CLEAN CATCH Performed at [facility], [address] [place],  [address]    Special Requests   Final  Normal Performed at [facility], [address] [place], [place] [address]    Culture (A)  Final    40,000 COLONIES/mL MULTIPLE SPECIES PRESENT, SUGGEST RECOLLECTION   Report Status 08/06/2018 FINAL  Final        Code Status Orders  (From admission, onward)         Start     Ordered   08/06/18 1008  Limited resuscitation (code)  Continuous    Question Answer Comment  In the event of cardiac or respiratory ARREST: Initiate Code Blue, Call Rapid Response Yes   In the event of cardiac or respiratory ARREST: Perform CPR Yes   In the event of cardiac or respiratory ARREST: Perform Intubation/Mechanical Ventilation No   In the event of cardiac or respiratory ARREST: Use NIPPV/BiPAp only if indicated Yes   In the event of cardiac or respiratory ARREST: Administer ACLS medications if indicated Yes   In the event of cardiac or respiratory ARREST: Perform Defibrillation or Cardioversion if indicated Yes   Comments Nurse may pronounce      08/06/18 1008        Code Status History    Date Active Date  Inactive Code Status Order ID Comments User Context   08/05/2018 0805 08/06/2018 1008 DNR [id number]  Loletha Grayer, MD ED          Follow-up Information    Kirk Ruths, MD On 08/16/2018.   Specialty:  Internal Medicine Why:  at [address] Contact information: [place] [address] [phone]        Algernon Huxley, MD On 08/23/2018.   Specialties:  Vascular Surgery, Radiology, Interventional Cardiology Why:  iliac a stenosis at 10am Contact information: [place] [address] [phone]           Discharge Medications   Allergies as of 08/08/2018      Reactions   Metoprolol Shortness Of Breath   Other Other (See Comments)   Sodium pentothal  Causes blood clots      Medication List    TAKE these medications   alum & mag hydroxide-simeth 200-200-20 MG/5ML suspension Commonly known as:  MAALOX/MYLANTA Take 30 mLs by mouth every 6 (six) hours as needed for indigestion or heartburn.   aspirin EC 81 MG tablet Take 81 mg by mouth daily.   buPROPion 150 MG 12 hr tablet Commonly known as:  WELLBUTRIN SR Take 150 mg by mouth daily.   busPIRone 10 MG tablet Commonly known as:  BUSPAR Take 10 mg by mouth daily.   cephALEXin 500 MG capsule Commonly known as:  KEFLEX Take 1 capsule (500 mg total) by mouth 4 (four) times daily for 10 days.   citalopram 20 MG tablet Commonly known as:  CELEXA Take 20 mg by mouth daily.   diltiazem 240 MG 24 hr capsule Commonly known as:  TIAZAC Take 240 mg by mouth daily.   esomeprazole 40 MG capsule Commonly known as:  NexIUM Take 1 capsule (40 mg total) by mouth daily.   HYDROcodone-acetaminophen 5-325 MG tablet Commonly known as:  NORCO/VICODIN Take 1 tablet by mouth every 6 (six) hours as needed for moderate pain.   letrozole 2.5 MG tablet Commonly known as:  FEMARA TAKE ONE TABLET BY MOUTH DAILY   LORazepam 1 MG tablet Commonly known as:  ATIVAN Take 1 mg by mouth as  needed.   pantoprazole 40 MG tablet Commonly known as:  Protonix Take 1 tablet (40 mg total) by  mouth daily.   polyethylene glycol 17 g packet Commonly known as:  MIRALAX / GLYCOLAX Take 17 g by mouth daily for 30 days.   pravastatin 40 MG tablet Commonly known as:  PRAVACHOL Take 40 mg by mouth daily.   triamterene-hydrochlorothiazide 37.5-25 MG tablet Commonly known as:  MAXZIDE-25 Take 1 tablet by mouth daily.   warfarin 5 MG tablet Commonly known as:  COUMADIN Take 5 mg by mouth daily. Takes along with a 2mg  to equal 7mg  daily   warfarin 2 MG tablet Commonly known as:  COUMADIN Take 2 mg by mouth daily.          Total Time in preparing paper work, data evaluation and todays exam - 9 minutes  Dustin Flock M.D on 08/08/2018 at [place]  [phone]

## 2018-08-08 NOTE — Care Management Important Message (Signed)
Important Message  Patient Details  Name: Stacy Reese MRN: [id number] Date of Birth: 02/19/1946   Medicare Important Message Given:  Yes    Elza Rafter, RN 08/08/2018, 10:46 AM

## 2018-08-09 LAB — HIV ANTIBODY (ROUTINE TESTING W REFLEX): HIV Screen 4th Generation wRfx: NONREACTIVE

## 2018-08-10 LAB — CULTURE, BLOOD (ROUTINE X 2)
Culture: NO GROWTH
Culture: NO GROWTH

## 2018-08-16 DIAGNOSIS — I739 Peripheral vascular disease, unspecified: Secondary | ICD-10-CM | POA: Insufficient documentation

## 2018-08-18 ENCOUNTER — Other Ambulatory Visit: Payer: Self-pay | Admitting: Oncology

## 2018-08-18 DIAGNOSIS — Z853 Personal history of malignant neoplasm of breast: Secondary | ICD-10-CM

## 2018-08-23 ENCOUNTER — Ambulatory Visit (INDEPENDENT_AMBULATORY_CARE_PROVIDER_SITE_OTHER): Payer: Medicare HMO | Admitting: Vascular Surgery

## 2018-08-23 ENCOUNTER — Encounter (INDEPENDENT_AMBULATORY_CARE_PROVIDER_SITE_OTHER): Payer: Self-pay | Admitting: Vascular Surgery

## 2018-08-23 ENCOUNTER — Other Ambulatory Visit: Payer: Self-pay

## 2018-08-23 VITALS — BP 129/92 | HR 94 | Resp 16 | Ht 65.0 in | Wt 210.6 lb

## 2018-08-23 DIAGNOSIS — E785 Hyperlipidemia, unspecified: Secondary | ICD-10-CM

## 2018-08-23 DIAGNOSIS — K922 Gastrointestinal hemorrhage, unspecified: Secondary | ICD-10-CM

## 2018-08-23 DIAGNOSIS — D6851 Activated protein C resistance: Secondary | ICD-10-CM | POA: Diagnosis not present

## 2018-08-23 DIAGNOSIS — I70212 Atherosclerosis of native arteries of extremities with intermittent claudication, left leg: Secondary | ICD-10-CM

## 2018-08-23 DIAGNOSIS — Z79899 Other long term (current) drug therapy: Secondary | ICD-10-CM

## 2018-08-23 DIAGNOSIS — I70219 Atherosclerosis of native arteries of extremities with intermittent claudication, unspecified extremity: Secondary | ICD-10-CM | POA: Insufficient documentation

## 2018-08-23 DIAGNOSIS — Z7901 Long term (current) use of anticoagulants: Secondary | ICD-10-CM

## 2018-08-23 NOTE — Assessment & Plan Note (Signed)
lipid control important in reducing the progression of atherosclerotic disease. Continue statin therapy  

## 2018-08-23 NOTE — Patient Instructions (Signed)

## 2018-08-23 NOTE — Assessment & Plan Note (Signed)
CT scan of the abdomen pelvis which I have independently reviewed.  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  The distal aorta is heavily calcific but not clearly stenotic.  This was not a CT angiogram and the thick cuts and the poor contrast bolus make this a difficult study to interpret.   We had a good discussion today about the pathophysiology and natural history of atherosclerotic disease.  She does not have any limb threatening symptoms and does not require any immediate intervention.  We discussed that the decision on whether or not to treat her peripheral arterial disease is based largely on her symptoms.  At current, she does not want to have any intervention performed and I think this is very reasonable.  She wants to think on this a little more.  We really do not have an assessment of her infrainguinal blood flow or ABIs, so I will plan to see her back in 1 to 2 months with noninvasive studies to help determine the degree of disease further.  If she has major changes in the interim, she will call our office.

## 2018-08-23 NOTE — Progress Notes (Signed)
Patient ID: Stacy Reese, female   DOB: 1945/05/22, 73 y.o.   MRN: [id number]  Chief Complaint  Patient presents with  . Follow-up    [facility] 2week follow up    HPI Stacy Reese is a 73 y.o. female.  I am asked to see the patient by Dr. Serita Grit for evaluation of PAD after recent hospitalization for other issues.  The patient was in the hospital with some abdominal pain and GI bleeding issues which quickly resolved without any major intervention.  As part of her hospital evaluation she underwent a CT scan of the abdomen pelvis which I have independently reviewed.  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  The distal aorta is heavily calcific but not clearly stenotic.  This was not a CT angiogram and the thick cuts and the poor contrast bolus make this a difficult study to interpret.   On questioning the patient today, she does describe claudication symptoms worse in the left leg than the right.  This was the same leg she had a DVT in some years ago but had always attributed it to that.  She does have some occasional swelling in that leg.  Going up hills, stairs, or more strenuous activity lead to weakness and heaviness in the left leg causing her have to stop and rest.  The pain generally goes away with rest but comes on with activity.  This has been present for many years with no clear inciting event or causative factor.  It has gradually worsened over time.  No open wounds, ulceration, or infection   Past Medical History:  Diagnosis Date  . Atrial fibrillation ([place])   . 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 DVT (deep venous thrombosis) ([place])   . Hypercholesterolemia   . Hypertension   . Personal history of chemotherapy    1 round  . Personal history of  radiation therapy   . Skin cancer    squamous cell  . Squamous cell carcinoma of skin    status post exicision    Past Surgical History:  Procedure Laterality Date  . BREAST BIOPSY Left 09/04/2013   negative stereo bx  . BREAST BIOPSY Left 08/25/2013   postive [place] core bx  . BREAST LUMPECTOMY Left 08/2017  . BREAST LUMPECTOMY W/ NEEDLE LOCALIZATION Left 2015  . ESOPHAGOGASTRODUODENOSCOPY (EGD) WITH PROPOFOL N/A 08/05/2018   Procedure: ESOPHAGOGASTRODUODENOSCOPY (EGD) WITH PROPOFOL;  Surgeon: Lucilla Lame, MD;  Location: [facility] ENDOSCOPY;  Service: Endoscopy;  Laterality: N/A;  . GANGLION CYST EXCISION      Family History Family History  Problem Relation Age of Onset  . Cancer Father        colon  . CAD Father   . Hypertension Father   . Hyperlipidemia Father   . CAD Mother   . Breast cancer Neg Hx     Social History Social History   Tobacco Use  . Smoking status: Former Research scientist (life sciences)  . Smokeless tobacco: Never Used  Substance Use Topics  . Alcohol use: Yes    Comment: social  . Drug use: No     Allergies  Allergen Reactions  . Metoprolol Shortness Of Breath  . Other Other (See Comments)    Sodium pentothal  Causes blood clots    Current Outpatient Medications  Medication Sig Dispense Refill  . alum & mag hydroxide-simeth (MAALOX/MYLANTA) 200-200-20 MG/5ML suspension Take 30 mLs by mouth every 6 (six) hours as needed for indigestion or heartburn. 355 mL 0  . aspirin EC 81 MG tablet Take 81 mg by mouth daily.     Marland Kitchen buPROPion (WELLBUTRIN SR) 150 MG 12 hr tablet Take 150 mg by mouth daily.     . busPIRone (BUSPAR) 10 MG tablet Take 10 mg by mouth daily.     . citalopram (CELEXA) 20 MG tablet Take 20 mg by mouth daily.     Marland Kitchen diltiazem (TIAZAC) 240 MG 24 hr capsule Take 240 mg by mouth daily.     Marland Kitchen esomeprazole (NEXIUM) 40 MG capsule Take 1 capsule (40 mg total) by mouth daily. 30 capsule 1  . letrozole (FEMARA) 2.5 MG tablet TAKE ONE TABLET BY MOUTH DAILY 90 tablet 0  .  LORazepam (ATIVAN) 1 MG tablet Take 1 mg by mouth as needed.     . pantoprazole (PROTONIX) 40 MG tablet Take 1 tablet (40 mg total) by mouth daily. 30 tablet 11  . polyethylene glycol (MIRALAX / GLYCOLAX) 17 g packet Take 17 g by mouth daily for 30 days. 30 packet 0  . pravastatin (PRAVACHOL) 40 MG tablet Take 40 mg by mouth daily.     Marland Kitchen triamterene-hydrochlorothiazide (MAXZIDE-25) 37.5-25 MG per tablet Take 1 tablet by mouth daily.     Marland Kitchen warfarin (COUMADIN) 2 MG tablet Take 2 mg by mouth daily.     Marland Kitchen warfarin (COUMADIN) 5 MG tablet Take 5 mg by mouth daily. Takes along with a 2mg  to equal 7mg  daily    . HYDROcodone-acetaminophen (NORCO/VICODIN) 5-325 MG tablet Take 1 tablet by mouth every 6 (six) hours as needed for moderate pain. (Patient not taking: Reported on 08/23/2018) 15 tablet 0   No current facility-administered medications for this visit.       REVIEW OF SYSTEMS (Negative unless checked)  Constitutional: [] Weight loss  [] Fever  [] Chills Cardiac: [] Chest pain   [] Chest pressure   [x] Palpitations   [] Shortness of breath when laying flat   [] Shortness of breath at rest   [x] Shortness of breath with exertion. Vascular:  [x] Pain in legs with walking   [] Pain in legs at rest   [] Pain in legs when laying flat   [x] Claudication   [] Pain in feet when walking  [] Pain in feet at rest  [] Pain in feet when laying flat   [x] History of DVT   [] Phlebitis   [] Swelling in legs   [] Varicose veins   [] Non-healing ulcers Pulmonary:   [] Uses home oxygen   [] Productive cough   [] Hemoptysis   [] Wheeze  [] COPD   [] Asthma Neurologic:  [] Dizziness  [] Blackouts   [] Seizures   [] History of stroke   [] History of TIA  [] Aphasia   [] Temporary blindness   [] Dysphagia   [] Weakness or numbness in arms   [] Weakness or numbness in legs Musculoskeletal:  [x] Arthritis   [] Joint swelling   [] Joint pain   [] Low back pain Hematologic:  [] Easy bruising  [] Easy bleeding   [] Hypercoagulable state   [x] Anemic  [] Hepatitis  Gastrointestinal:  [x] Blood in stool   [] Vomiting blood  [] Gastroesophageal reflux/heartburn   [x] Abdominal pain Genitourinary:  [] Chronic kidney disease   [] Difficult urination  [] Frequent urination  [] Burning with urination   [] Hematuria Skin:  [] Rashes   [] Ulcers   [] Wounds Psychological:  [] History of anxiety   []  History of major depression.    Physical Exam BP (!) 129/92 (  BP Location: Right Arm)   Pulse 94   Resp 16   Ht 5\' 5"  (1.651 m)   Wt 210 lb 9.6 oz (95.5 kg)   BMI 35.05 kg/m  Gen:  WD/WN, NAD Head: [place]/AT, No temporalis wasting.  Ear/Nose/Throat: Hearing grossly intact, nares w/o erythema or drainage, oropharynx w/o Erythema/Exudate Eyes: Conjunctiva clear, sclera non-icteric  Neck: trachea midline.  No JVD.  Pulmonary:  Good air movement, respirations not labored, no use of accessory muscles  Cardiac: RRR, no JVD Vascular:  Vessel Right Left  Radial Palpable Palpable                          DP 1+ 1+  PT 2+ 1+   Gastrointestinal:. No masses, surgical incisions, or scars. Musculoskeletal: M/S 5/5 throughout.  Extremities without ischemic changes.  No deformity or atrophy. Mild LE edema. Diffuse varicosities bilaterally Neurologic: Sensation grossly intact in extremities.  Symmetrical.  Speech is fluent. Motor exam as listed above. Psychiatric: Judgment intact, Mood & affect appropriate for pt's clinical situation. Dermatologic: No rashes or ulcers noted.  No cellulitis or open wounds.    Radiology Ct Abdomen Pelvis W Contrast  Result Date: 08/04/2018 CLINICAL DATA:  Abdominal pain, radiating toward back EXAM: CT ABDOMEN AND PELVIS WITH CONTRAST TECHNIQUE: Multidetector CT imaging of the abdomen and pelvis was performed using the standard protocol following bolus administration of intravenous contrast. Oral contrast was also administered. CONTRAST:  131mL OMNIPAQUE IOHEXOL 300 MG/ML  SOLN COMPARISON:  None. FINDINGS: Lower chest: There is atelectatic change in  each lung base. There is a small area of apparent pneumonia in the posterior segment right lower lobe. There are foci of coronary artery calcification. There is oral contrast in the distal esophagus, likely indicative of a degree of spontaneous gastroesophageal reflux. Hepatobiliary: There is hepatic steatosis. No focal liver lesion is appreciable. There appear to be gallstones, potentially intermingled with sludge, within the gallbladder. No appreciable gallbladder wall thickening evident. There is no evident biliary duct dilatation. Pancreas: There is no pancreatic mass or inflammatory focus. Spleen: No splenic lesions are evident. Adrenals/Urinary Tract: Adrenals bilaterally appear unremarkable. Kidneys bilaterally show no evident mass or hydronephrosis on either side. There is no appreciable renal or ureteral calculus on either side. Urinary bladder is midline with wall thickness within normal limits. Stomach/Bowel: There are sigmoid and descending colonic diverticula without demonstrable diverticulitis. There is no appreciable bowel wall or mesenteric thickening. No evident bowel obstruction. Terminal ileum appears normal. There is mild lipomatous infiltration of the ileocecal valve. There is no appreciable free air or portal venous air. No findings suggesting bowel ischemia. Vascular/Lymphatic: There is aortic and bilateral iliac artery atherosclerosis. There is moderate calcification at the origin of the celiac artery. The superior mesenteric artery appears patent. There is no evident adenopathy in the abdomen or pelvis. Reproductive: Uterus is anteverted.  No pelvic mass evident. Other: Appendix appears normal. There is no abscess or ascites in the abdomen or pelvis. There is a minimal ventral hernia containing only fat. There is soft tissue opacification posterior to the paraspinous muscles of the lumbar region slightly deep to the subcutaneous regions, likely due to a degree of panniculitis. There is also  subcutaneous edema in this area. Musculoskeletal: There is degenerative change in the lower thoracic and lumbar regions. There is moderate spinal stenosis at L3-4 due to disc protrusion and bony hypertrophy no intramuscular lesions are evident. IMPRESSION: 1.  Small focus of apparent pneumonia posterior  right lung base. 2. Descending colonic and sigmoid diverticulosis without frank diverticulitis. No bowel obstruction. No abscess in the abdomen pelvis. Appendix appears normal. 3. No evident renal or ureteral calculus. No hydronephrosis. Urinary bladder wall thickness normal. 4. Extensive aortic and iliac artery atherosclerosis. There appears to be hemodynamically significant narrowing at the origin of the left common iliac artery based on the degree of calcification present in this area. Foci of coronary artery calcification also noted. 5. Apparent cholelithiasis, likely intermingled with sludge. No gallbladder wall thickening evident by CT. 6.  Hepatic steatosis. 7.  Moderate spinal stenosis at L3-4, multifactorial. 8. 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 Electronically Signed   By: Lowella Grip III M.D.   On: 08/04/2018 10:21   Dg Chest Port 1 View  Result Date: 08/05/2018 CLINICAL DATA:  73 year old female with possible pneumonia in the right costophrenic angle on CT Abdomen and Pelvis yesterday. Persistent abdominal and back pain. EXAM: PORTABLE CHEST 1 VIEW COMPARISON:  CT Abdomen and Pelvis 08/04/2018 and earlier. FINDINGS: Portable AP upright view at 0548 hours. Stable mild cardiomegaly. Other mediastinal contours are within normal limits. Visualized tracheal air column is within normal limits. Allowing for portable technique the lungs are clear. No pneumothorax or pleural effusion. In the visible upper abdomen there is oral contrast in nondilated transverse colon. No pneumoperitoneum  identified. IMPRESSION: 1. No acute cardiopulmonary abnormality by portable x-ray. Suspect right costophrenic angle opacity seen yesterday was atelectasis. 2. Oral contrast from yesterday in nondilated transverse colon. Electronically Signed   By: Genevie Ann M.D.   On: 08/05/2018 06:27   US Abdomen Limited Ruq  Result Date: 08/04/2018 CLINICAL DATA:  Right upper quadrant pain and gallstones on recent CT EXAM: ULTRASOUND ABDOMEN LIMITED RIGHT UPPER QUADRANT COMPARISON:  CT from earlier in the same day. FINDINGS: Gallbladder: Gallbladder is well distended with multiple stones within. No wall thickening or pericholecystic fluid is noted. Negative sonographic Murphy's sign is noted. Common bile duct: Diameter: 6.2 mm. Liver: Increased in echogenicity consistent with fatty infiltration. No focal mass is seen. Portal vein is patent on color Doppler imaging with normal direction of blood flow towards the liver. IMPRESSION: Fatty liver. Cholelithiasis without complicating factors. Electronically Signed   By: Inez Catalina M.D.   On: 08/04/2018 11:36    Labs Recent Results (from the past 2160 hour(s))  Lipase, blood     Status: None   Collection Time: 08/04/18  6:28 AM  Result Value Ref Range   Lipase 40 11 - 51 U/L    Comment: Performed at [facility], [address] [place], [place] [address]  Comprehensive metabolic panel     Status: Abnormal   Collection Time: 08/04/18  6:28 AM  Result Value Ref Range   Sodium 135 135 - 145 mmol/L   Potassium 3.3 (L) 3.5 - 5.1 mmol/L   Chloride 100 98 - 111 mmol/L   CO2 24 22 - 32 mmol/L   Glucose, Bld 204 (H) 70 - 99 mg/dL   BUN 17 8 - 23 mg/dL   Creatinine, Ser 0.82 0.44 - 1.00 mg/dL   Calcium 9.8 8.9 - 10.3 mg/dL   Total Protein 7.9 6.5 - 8.1 g/dL   Albumin 4.0 3.5 - 5.0 g/dL   AST 34 15 - 41 U/L   ALT 32 0 - 44 U/L   Alkaline Phosphatase 82 38 - 126 U/L  Total Bilirubin 0.7 0.3 - 1.2 mg/dL   GFR calc non Af Amer >60 >60 mL/min   GFR calc Af  Amer >60 >60 mL/min   Anion gap 11 5 - 15    Comment: Performed at [facility], [address] [place], [place] [address]  CBC     Status: Abnormal   Collection Time: 08/04/18  6:28 AM  Result Value Ref Range   WBC 9.7 4.0 - 10.5 K/uL   RBC 5.21 (H) 3.87 - 5.11 MIL/uL   Hemoglobin 15.0 12.0 - 15.0 g/dL   HCT 43.9 36.0 - 46.0 %   MCV 84.3 80.0 - 100.0 fL   MCH 28.8 26.0 - 34.0 pg   MCHC 34.2 30.0 - 36.0 g/dL   RDW 12.9 11.5 - 15.5 %   Platelets 214 150 - 400 K/uL   nRBC 0.0 0.0 - 0.2 %    Comment: Performed at [facility], [address] [place], [place] [address]  CBC with Differential/Platelet     Status: Abnormal   Collection Time: 08/04/18  6:28 AM  Result Value Ref Range   WBC 9.7 4.0 - 10.5 K/uL   RBC 5.20 (H) 3.87 - 5.11 MIL/uL   Hemoglobin 15.0 12.0 - 15.0 g/dL   HCT 44.0 36.0 - 46.0 %   MCV 84.6 80.0 - 100.0 fL   MCH 28.8 26.0 - 34.0 pg   MCHC 34.1 30.0 - 36.0 g/dL   RDW 12.9 11.5 - 15.5 %   Platelets 223 150 - 400 K/uL   nRBC 0.0 0.0 - 0.2 %   Neutrophils Relative % 78 %   Neutro Abs 7.7 1.7 - 7.7 K/uL   Lymphocytes Relative 12 %   Lymphs Abs 1.2 0.7 - 4.0 K/uL   Monocytes Relative 5 %   Monocytes Absolute 0.5 0.1 - 1.0 K/uL   Eosinophils Relative 3 %   Eosinophils Absolute 0.2 0.0 - 0.5 K/uL   Basophils Relative 1 %   Basophils Absolute 0.1 0.0 - 0.1 K/uL   Immature Granulocytes 1 %   Abs Immature Granulocytes 0.06 0.00 - 0.07 K/uL    Comment: Performed at [facility], [address] [place], [place] [address]  Urinalysis, Complete w Microscopic     Status: Abnormal   Collection Time: 08/04/18  6:35 AM  Result Value Ref Range   Color, Urine YELLOW (A) YELLOW   APPearance HAZY (A) CLEAR   Specific Gravity, Urine 1.014 1.005 - 1.030   pH 6.0 5.0 - 8.0   Glucose, UA 50 (A) NEGATIVE mg/dL   Hgb urine dipstick SMALL (A) NEGATIVE   Bilirubin Urine NEGATIVE NEGATIVE   Ketones, ur NEGATIVE NEGATIVE mg/dL   Protein, ur  NEGATIVE NEGATIVE mg/dL   Nitrite NEGATIVE NEGATIVE   Leukocytes,Ua LARGE (A) NEGATIVE   RBC / HPF 6-10 0 - 5 RBC/hpf   WBC, UA 21-50 0 - 5 WBC/hpf   Bacteria, UA MANY (A) NONE SEEN   Squamous Epithelial / LPF 0-5 0 - 5   Non Squamous Epithelial PRESENT (A) NONE SEEN    Comment: Performed at [facility], [address] [place], [place] [address]  CBC with Differential     Status: Abnormal   Collection Time: 08/05/18  5:59 AM  Result Value Ref Range   WBC 13.0 (H) 4.0 - 10.5 K/uL   RBC 4.89 3.87 - 5.11 MIL/uL   Hemoglobin 13.9 12.0 - 15.0 g/dL   HCT 40.6 36.0 - 46.0 %  MCV 83.0 80.0 - 100.0 fL   MCH 28.4 26.0 - 34.0 pg   MCHC 34.2 30.0 - 36.0 g/dL   RDW 12.7 11.5 - 15.5 %   Platelets 214 150 - 400 K/uL   nRBC 0.0 0.0 - 0.2 %   Neutrophils Relative % 87 %   Neutro Abs 11.3 (H) 1.7 - 7.7 K/uL   Lymphocytes Relative 7 %   Lymphs Abs 0.9 0.7 - 4.0 K/uL   Monocytes Relative 5 %   Monocytes Absolute 0.6 0.1 - 1.0 K/uL   Eosinophils Relative 1 %   Eosinophils Absolute 0.1 0.0 - 0.5 K/uL   Basophils Relative 0 %   Basophils Absolute 0.1 0.0 - 0.1 K/uL   Immature Granulocytes 0 %   Abs Immature Granulocytes 0.05 0.00 - 0.07 K/uL    Comment: Performed at [facility], [address] [place], [place] [address]  Comprehensive metabolic panel     Status: Abnormal   Collection Time: 08/05/18  5:59 AM  Result Value Ref Range   Sodium 129 (L) 135 - 145 mmol/L   Potassium 3.1 (L) 3.5 - 5.1 mmol/L   Chloride 92 (L) 98 - 111 mmol/L   CO2 26 22 - 32 mmol/L   Glucose, Bld 185 (H) 70 - 99 mg/dL   BUN 11 8 - 23 mg/dL   Creatinine, Ser 0.73 0.44 - 1.00 mg/dL   Calcium 8.8 (L) 8.9 - 10.3 mg/dL   Total Protein 7.3 6.5 - 8.1 g/dL   Albumin 3.9 3.5 - 5.0 g/dL   AST 55 (H) 15 - 41 U/L   ALT 77 (H) 0 - 44 U/L   Alkaline Phosphatase 84 38 - 126 U/L   Total Bilirubin 1.1 0.3 - 1.2 mg/dL   GFR calc non Af Amer >60 >60 mL/min   GFR calc Af Amer >60 >60 mL/min   Anion gap 11  5 - 15    Comment: Performed at [facility], [address] [place], [place] [address]  Lipase, blood     Status: Abnormal   Collection Time: 08/05/18  5:59 AM  Result Value Ref Range   Lipase 58 (H) 11 - 51 U/L    Comment: Performed at [facility], [address] [place], [place] [address]  Troponin I - Once     Status: None   Collection Time: 08/05/18  5:59 AM  Result Value Ref Range   Troponin I <0.03 <0.03 ng/mL    Comment: Performed at [facility], [address] [place], [place] [address]  Type and screen [place]     Status: None   Collection Time: 08/05/18  5:59 AM  Result Value Ref Range   ABO/RH(D) A POS    Antibody Screen NEG    Sample Expiration      08/08/2018,2359 Performed at [facility], [address] [place], [place] [address]   Protime-INR     Status: Abnormal   Collection Time: 08/05/18  5:59 AM  Result Value Ref Range   Prothrombin Time 22.7 (H) 11.4 - 15.2 seconds   INR 2.0 (H) 0.8 - 1.2    Comment: (NOTE) INR goal varies based on device and disease states. Performed at [facility], [place], [place] [address]   Lactic acid, plasma     Status: Abnormal   Collection Time: 08/05/18  5:59 AM  Result Value Ref Range   Lactic Acid, Venous 2.3 (HH) 0.5 -  1.9 mmol/L    Comment: CRITICAL RESULT CALLED TO, READ BACK BY AND VERIFIED WITH ALIVIA TAYLOR AT 7616 08/05/2018.PMF Performed at [facility], [address] [place], [place] [address]   Culture, blood (routine x 2)     Status: None   Collection Time: 08/05/18  5:59 AM  Result Value Ref Range   Specimen Description BLOOD BLOOD RIGHT FOREARM    Special Requests      BOTTLES DRAWN AEROBIC AND ANAEROBIC Blood Culture results may not be optimal due to an excessive volume of blood received in culture bottles   Culture      NO GROWTH 5 DAYS Performed at [facility], [address],  [place], [place] [address]    Report Status 08/10/2018 FINAL   SARS Coronavirus 2 (CEPHEID - Performed in [place] hospital lab), Hosp Order     Status: None   Collection Time: 08/05/18  6:15 AM  Result Value Ref Range   SARS Coronavirus 2 NEGATIVE NEGATIVE    Comment: (NOTE) If result is NEGATIVE SARS-CoV-2 target nucleic acids are NOT DETECTED. The SARS-CoV-2 RNA is generally detectable in upper and lower  respiratory specimens during the acute phase of infection. The lowest  concentration of SARS-CoV-2 viral copies this assay can detect is 250  copies / mL. A negative result does not preclude SARS-CoV-2 infection  and should not be used as the sole basis for treatment or other  patient management decisions.  A negative result may occur with  improper specimen collection / handling, submission of specimen other  than nasopharyngeal swab, presence of viral mutation(s) within the  areas targeted by this assay, and inadequate number of viral copies  (<250 copies / mL). A negative result must be combined with clinical  observations, patient history, and epidemiological information. If result is POSITIVE SARS-CoV-2 target nucleic acids are DETECTED. The SARS-CoV-2 RNA is generally detectable in upper and lower  respiratory specimens dur ing the acute phase of infection.  Positive  results are indicative of active infection with SARS-CoV-2.  Clinical  correlation with patient history and other diagnostic information is  necessary to determine patient infection status.  Positive results do  not rule out bacterial infection or co-infection with other viruses. If result is PRESUMPTIVE POSTIVE SARS-CoV-2 nucleic acids MAY BE PRESENT.   A presumptive positive result was obtained on the submitted specimen  and confirmed on repeat testing.  While 2019 novel coronavirus  (SARS-CoV-2) nucleic acids may be present in the submitted sample  additional confirmatory testing may be necessary for  epidemiological  and / or clinical management purposes  to differentiate between  SARS-CoV-2 and other Sarbecovirus currently known to infect humans.  If clinically indicated additional testing with an alternate test  methodology [phone]) is advised. The SARS-CoV-2 RNA is generally  detectable in upper and lower respiratory sp ecimens during the acute  phase of infection. The expected result is Negative. Fact Sheet for Patients:  StrictlyIdeas.no Fact Sheet for Healthcare Providers: BankingDealers.co.za This test is not yet approved or cleared by the [place] FDA and has been authorized for detection and/or diagnosis of SARS-CoV-2 by FDA under an Emergency Use Authorization (EUA).  This EUA will remain in effect (meaning this test can be used) for the duration of the COVID-19 declaration under Section 564(b)(1) of the Act, 21 U.S.C. section 360bbb-3(b)(1), unless the authorization is terminated or revoked sooner. Performed at [facility], [address], [place],  [address]   Culture, blood (  routine x 2)     Status: None   Collection Time: 08/05/18  6:16 AM  Result Value Ref Range   Specimen Description BLOOD RIGHT ANTECUBITAL    Special Requests      BOTTLES DRAWN AEROBIC AND ANAEROBIC Blood Culture results may not be optimal due to an excessive volume of blood received in culture bottles   Culture      NO GROWTH 5 DAYS Performed at [facility], [address], [place], [place] [address]    Report Status 08/10/2018 FINAL   Lactic acid, plasma     Status: None   Collection Time: 08/05/18  7:29 AM  Result Value Ref Range   Lactic Acid, Venous 0.9 0.5 - 1.9 mmol/L    Comment: Performed at [facility], [address] [place], [place] [address]  Urine Culture     Status: Abnormal   Collection Time: 08/05/18  7:29 AM  Result Value Ref Range   Specimen Description      URINE, CLEAN CATCH  Performed at [facility], [address], [place], [place] [address]    Special Requests      Normal Performed at [facility], [address] [place], [place] [address]    Culture (A)     40,000 COLONIES/mL MULTIPLE SPECIES PRESENT, SUGGEST RECOLLECTION   Report Status 08/06/2018 FINAL   HIV antibody (Routine Testing)     Status: None   Collection Time: 08/05/18 12:49 PM  Result Value Ref Range   HIV Screen 4th Generation wRfx Non Reactive Non Reactive    Comment: (NOTE) Performed At: [facility] [place], [place] [id number] Rush Farmer MD [place]:[id number]   Hemoglobin     Status: None   Collection Time: 08/05/18 12:49 PM  Result Value Ref Range   Hemoglobin 13.6 12.0 - 15.0 g/dL    Comment: Performed at [facility], [address] [place], [place] [address]  HIV Antibody (routine testing w rflx)     Status: None   Collection Time: 08/06/18  5:30 AM  Result Value Ref Range   HIV Screen 4th Generation wRfx Non Reactive Non Reactive    Comment: (NOTE) Performed At: [facility] [place], [place] [id number] Rush Farmer MD LG:[id number]   Basic metabolic panel     Status: Abnormal   Collection Time: 08/06/18  5:30 AM  Result Value Ref Range   Sodium 136 135 - 145 mmol/L    Comment: RESULTS VERIFIED BY REPEAT TESTING/HKP   Potassium 3.1 (L) 3.5 - 5.1 mmol/L   Chloride 102 98 - 111 mmol/L   CO2 27 22 - 32 mmol/L   Glucose, Bld 164 (H) 70 - 99 mg/dL   BUN 7 (L) 8 - 23 mg/dL   Creatinine, Ser 0.64 0.44 - 1.00 mg/dL   Calcium 8.3 (L) 8.9 - 10.3 mg/dL   GFR calc non Af Amer >60 >60 mL/min   GFR calc Af Amer >60 >60 mL/min   Anion gap 7 5 - 15    Comment: Performed at [facility], [address] [place],  [address]  CBC     Status: Abnormal   Collection Time: 08/06/18  5:30 AM  Result Value Ref Range   WBC 12.8 (H) 4.0 - 10.5 K/uL   RBC 4.45 3.87 - 5.11 MIL/uL   Hemoglobin  12.7 12.0 - 15.0 g/dL   HCT 38.0 36.0 - 46.0 %   MCV 85.4 80.0 - 100.0  fL   MCH 28.5 26.0 - 34.0 pg   MCHC 33.4 30.0 - 36.0 g/dL   RDW 12.9 11.5 - 15.5 %   Platelets 172 150 - 400 K/uL   nRBC 0.0 0.0 - 0.2 %    Comment: Performed at [facility], [address] [place], [place] [address]  Protime-INR     Status: Abnormal   Collection Time: 08/06/18  5:30 AM  Result Value Ref Range   Prothrombin Time 25.8 (H) 11.4 - 15.2 seconds   INR 2.4 (H) 0.8 - 1.2    Comment: (NOTE) INR goal varies based on device and disease states. Performed at [facility], [address] [place], [place] [address]   CBC     Status: None   Collection Time: 08/07/18  5:32 AM  Result Value Ref Range   WBC 8.4 4.0 - 10.5 K/uL   RBC 4.26 3.87 - 5.11 MIL/uL   Hemoglobin 12.4 12.0 - 15.0 g/dL   HCT 38.0 36.0 - 46.0 %   MCV 89.2 80.0 - 100.0 fL   MCH 29.1 26.0 - 34.0 pg   MCHC 32.6 30.0 - 36.0 g/dL   RDW 13.2 11.5 - 15.5 %   Platelets 161 150 - 400 K/uL   nRBC 0.0 0.0 - 0.2 %    Comment: Performed at [facility], [address] [place], [place] [address]  Basic metabolic panel     Status: Abnormal   Collection Time: 08/07/18  5:32 AM  Result Value Ref Range   Sodium 140 135 - 145 mmol/L   Potassium 4.1 3.5 - 5.1 mmol/L   Chloride 109 98 - 111 mmol/L   CO2 24 22 - 32 mmol/L   Glucose, Bld 125 (H) 70 - 99 mg/dL   BUN 8 8 - 23 mg/dL   Creatinine, Ser 0.64 0.44 - 1.00 mg/dL   Calcium 8.5 (L) 8.9 - 10.3 mg/dL   GFR calc non Af Amer >60 >60 mL/min   GFR calc Af Amer >60 >60 mL/min   Anion gap 7 5 - 15    Comment: Performed at [facility], [address] [place], [place] [address]  CBC     Status: Abnormal   Collection Time: 08/08/18  6:21 AM  Result Value Ref Range   WBC 8.7 4.0 - 10.5 K/uL   RBC 4.13 3.87 - 5.11 MIL/uL   Hemoglobin 11.9 (L) 12.0 - 15.0 g/dL   HCT 37.2 36.0 - 46.0 %   MCV 90.1 80.0 - 100.0 fL   MCH 28.8 26.0 - 34.0 pg   MCHC 32.0 30.0 -  36.0 g/dL   RDW 13.4 11.5 - 15.5 %   Platelets 187 150 - 400 K/uL   nRBC 0.0 0.0 - 0.2 %    Comment: Performed at [facility], [address] [place], [place] [address]  Basic metabolic panel     Status: Abnormal   Collection Time: 08/08/18  6:21 AM  Result Value Ref Range   Sodium 140 135 - 145 mmol/L   Potassium 4.2 3.5 - 5.1 mmol/L   Chloride 109 98 - 111 mmol/L   CO2 23 22 - 32 mmol/L   Glucose, Bld 126 (H) 70 - 99 mg/dL   BUN 14 8 - 23 mg/dL   Creatinine, Ser 0.75 0.44 - 1.00 mg/dL   Calcium 8.6 (L) 8.9 - 10.3 mg/dL   GFR calc non Af Amer >60 >60 mL/min   GFR calc Af Amer >  60 >60 mL/min   Anion gap 8 5 - 15    Comment: Performed at [facility], [address], [place] [address]  Protime-INR     Status: Abnormal   Collection Time: 08/08/18  6:21 AM  Result Value Ref Range   Prothrombin Time 18.8 (H) 11.4 - 15.2 seconds   INR 1.6 (H) 0.8 - 1.2    Comment: (NOTE) INR goal varies based on device and disease states. Performed at [facility], [address] [place], [place] [address]     Assessment/Plan:  Heterozygous factor V Leiden mutation [facility]) On anticoagulation, history of DVT  Hyperlipidemia lipid control important in reducing the progression of atherosclerotic disease. Continue statin therapy   Upper GI bleed Resolved without major intervention  Atherosclerosis of native arteries of extremity with intermittent claudication (HCC) CT scan of the abdomen pelvis which I have independently reviewed.  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  The distal aorta is heavily calcific but not clearly stenotic.  This was not a CT angiogram and the thick cuts and the poor contrast bolus make this a difficult study to interpret.   We had a good  discussion today about the pathophysiology and natural history of atherosclerotic disease.  She does not have any limb threatening symptoms and does not require any immediate intervention.  We discussed that the decision on whether or not to treat her peripheral arterial disease is based largely on her symptoms.  At current, she does not want to have any intervention performed and I think this is very reasonable.  She wants to think on this a little more.  We really do not have an assessment of her infrainguinal blood flow or ABIs, so I will plan to see her back in 1 to 2 months with noninvasive studies to help determine the degree of disease further.  If she has major changes in the interim, she will call our office.      Leotis Pain 08/23/2018, 12:24 PM   This note was created with Dragon medical transcription system.  Any errors from dictation are unintentional.

## 2018-08-23 NOTE — Assessment & Plan Note (Signed)
Resolved without major intervention

## 2018-08-23 NOTE — Assessment & Plan Note (Signed)
On anticoagulation, history of DVT

## 2018-08-28 NOTE — Progress Notes (Signed)
[place]  [phone] [phone]) [phone]  ID: Stacy Reese OB: 1945-08-04  MR#: [id number]  TDV#:[id number]  Patient Care Team: Kirk Ruths, MD as PCP - General (Internal Medicine)  I connected with Stacy Reese on 08/31/18 at 10:30 AM EDT by telephone visit and verified that I am speaking with the correct person using two identifiers.   I discussed the limitations, risks, security and privacy concerns of performing an evaluation and management service by telemedicine and the availability of in-person appointments. I also discussed with the patient that there may be a patient responsible charge related to this service. The patient expressed understanding and agreed to proceed.   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  Heterozygote factor V 5 Leiden.   INTERVAL HISTORY: Patient agreed to evaluation by telephone for her routine 43-month follow-up.  She currently feels well and is asymptomatic.  She is tolerating letrozole and Coumadin without significant side effects.  She has no neurologic complaints. She denies any recent fevers or illnesses. She denies any pain. She denies any chest pain, cough, hemoptysis, or shortness of breath. She denies any nausea, vomiting, constipation or diarrhea. She has no melena or hematochezia. She has no urinary complaints.  Patient feels at her baseline offers no specific complaints today.  REVIEW OF SYSTEMS:   Review of Systems  Constitutional: Negative.  Negative for fever, malaise/fatigue and weight loss.  Respiratory: Negative.  Negative for cough and shortness of breath.   Cardiovascular: Negative.  Negative for chest pain and leg swelling.  Gastrointestinal: Negative.  Negative for  abdominal pain, blood in stool and melena.  Genitourinary: Negative.  Negative for hematuria.  Musculoskeletal: Negative.  Negative for back pain.  Skin: Negative.  Negative for rash.  Neurological: Negative.  Negative for focal weakness, weakness and headaches.  Endo/Heme/Allergies: Does not bruise/bleed easily.  Psychiatric/Behavioral: Negative.  The patient is not nervous/anxious.     As per HPI. Otherwise, a complete review of systems is negative.  PAST MEDICAL HISTORY: Past Medical History:  Diagnosis Date  . Atrial fibrillation ([place])   . Breast cancer ([place]) 08/2013   ER positive adenocarcinoma of Left Breast. with rad tx  . DVT (deep venous thrombosis) ([place])   . Hypercholesterolemia   . Hypertension   . Personal history of chemotherapy    1 round  . Personal history of radiation therapy   . Skin cancer    squamous cell  . Squamous cell carcinoma of skin    status post exicision    PAST SURGICAL HISTORY: Past Surgical History:  Procedure Laterality Date  . BREAST BIOPSY Left 09/04/2013   negative stereo bx  . BREAST BIOPSY Left 08/25/2013   postive [place] core bx  . BREAST LUMPECTOMY Left 08/2017  . BREAST LUMPECTOMY W/ NEEDLE LOCALIZATION Left 2015  . ESOPHAGOGASTRODUODENOSCOPY (EGD) WITH PROPOFOL N/A 08/05/2018   Procedure: ESOPHAGOGASTRODUODENOSCOPY (EGD) WITH PROPOFOL;  Surgeon: Lucilla Lame, MD;  Location: [facility] ENDOSCOPY;  Service: Endoscopy;  Laterality: N/A;  . GANGLION CYST EXCISION      FAMILY HISTORY Family History  Problem Relation Age of Onset  . Cancer Father        colon  . CAD Father   . Hypertension Father   . Hyperlipidemia Father   . CAD Mother   .  Breast cancer Neg Hx        ADVANCED DIRECTIVES:    HEALTH MAINTENANCE: Social History   Tobacco Use  . Smoking status: Former Research scientist (life sciences)  . Smokeless tobacco: Never Used  Substance Use Topics  . Alcohol use: Yes    Comment: social  . Drug use: No     Colonoscopy:  PAP:  Bone density:   Lipid panel:  Allergies  Allergen Reactions  . Metoprolol Shortness Of Breath  . Other Other (See Comments)    Sodium pentothal  Causes blood clots    Current Outpatient Medications  Medication Sig Dispense Refill  . alum & mag hydroxide-simeth (MAALOX/MYLANTA) 200-200-20 MG/5ML suspension Take 30 mLs by mouth every 6 (six) hours as needed for indigestion or heartburn. 355 mL 0  . aspirin EC 81 MG tablet Take 81 mg by mouth daily.     Marland Kitchen buPROPion (WELLBUTRIN SR) 150 MG 12 hr tablet Take 150 mg by mouth daily.     . busPIRone (BUSPAR) 10 MG tablet Take 10 mg by mouth daily.     . citalopram (CELEXA) 20 MG tablet Take 20 mg by mouth daily.     Marland Kitchen diltiazem (TIAZAC) 240 MG 24 hr capsule Take 240 mg by mouth daily.     Marland Kitchen esomeprazole (NEXIUM) 40 MG capsule Take 1 capsule (40 mg total) by mouth daily. 30 capsule 1  . letrozole (FEMARA) 2.5 MG tablet TAKE ONE TABLET BY MOUTH DAILY 90 tablet 0  . LORazepam (ATIVAN) 1 MG tablet Take 1 mg by mouth as needed.     . pantoprazole (PROTONIX) 40 MG tablet Take 1 tablet (40 mg total) by mouth daily. 30 tablet 11  . polyethylene glycol (MIRALAX / GLYCOLAX) 17 g packet Take 17 g by mouth daily for 30 days. 30 packet 0  . pravastatin (PRAVACHOL) 40 MG tablet Take 40 mg by mouth daily.     Marland Kitchen triamterene-hydrochlorothiazide (MAXZIDE-25) 37.5-25 MG per tablet Take 1 tablet by mouth daily.     Marland Kitchen warfarin (COUMADIN) 2 MG tablet Take 2 mg by mouth daily.     Marland Kitchen warfarin (COUMADIN) 5 MG tablet Take 5 mg by mouth daily. Takes along with a 2mg  to equal 7mg  daily    . HYDROcodone-acetaminophen (NORCO/VICODIN) 5-325 MG tablet Take 1 tablet by mouth every 6 (six) hours as needed for moderate pain. (Patient not taking: Reported on 08/23/2018) 15 tablet 0   No current facility-administered medications for this visit.     OBJECTIVE: There were no vitals filed for this visit.   There is no height or weight on file to calculate BMI.    ECOG FS:0 - Asymptomatic   LAB  RESULTS:  Lab Results  Component Value Date   NA 140 08/08/2018   K 4.2 08/08/2018   CL 109 08/08/2018   CO2 23 08/08/2018   GLUCOSE 126 (H) 08/08/2018   BUN 14 08/08/2018   CREATININE 0.75 08/08/2018   CALCIUM 8.6 (L) 08/08/2018   PROT 7.3 08/05/2018   ALBUMIN 3.9 08/05/2018   AST 55 (H) 08/05/2018   ALT 77 (H) 08/05/2018   ALKPHOS 84 08/05/2018   BILITOT 1.1 08/05/2018   GFRNONAA >60 08/08/2018   GFRAA >60 08/08/2018    Lab Results  Component Value Date   WBC 8.7 08/08/2018   NEUTROABS 11.3 (H) 08/05/2018   HGB 11.9 (L) 08/08/2018   HCT 37.2 08/08/2018   MCV 90.1 08/08/2018   PLT 187 08/08/2018     STUDIES: Ct  Abdomen Pelvis W Contrast  Result Date: 08/04/2018 CLINICAL DATA:  Abdominal pain, radiating toward back EXAM: CT ABDOMEN AND PELVIS WITH CONTRAST TECHNIQUE: Multidetector CT imaging of the abdomen and pelvis was performed using the standard protocol following bolus administration of intravenous contrast. Oral contrast was also administered. CONTRAST:  131mL OMNIPAQUE IOHEXOL 300 MG/ML  SOLN COMPARISON:  None. FINDINGS: Lower chest: There is atelectatic change in each lung base. There is a small area of apparent pneumonia in the posterior segment right lower lobe. There are foci of coronary artery calcification. There is oral contrast in the distal esophagus, likely indicative of a degree of spontaneous gastroesophageal reflux. Hepatobiliary: There is hepatic steatosis. No focal liver lesion is appreciable. There appear to be gallstones, potentially intermingled with sludge, within the gallbladder. No appreciable gallbladder wall thickening evident. There is no evident biliary duct dilatation. Pancreas: There is no pancreatic mass or inflammatory focus. Spleen: No splenic lesions are evident. Adrenals/Urinary Tract: Adrenals bilaterally appear unremarkable. Kidneys bilaterally show no evident mass or hydronephrosis on either side. There is no appreciable renal or ureteral  calculus on either side. Urinary bladder is midline with wall thickness within normal limits. Stomach/Bowel: There are sigmoid and descending colonic diverticula without demonstrable diverticulitis. There is no appreciable bowel wall or mesenteric thickening. No evident bowel obstruction. Terminal ileum appears normal. There is mild lipomatous infiltration of the ileocecal valve. There is no appreciable free air or portal venous air. No findings suggesting bowel ischemia. Vascular/Lymphatic: There is aortic and bilateral iliac artery atherosclerosis. There is moderate calcification at the origin of the celiac artery. The superior mesenteric artery appears patent. There is no evident adenopathy in the abdomen or pelvis. Reproductive: Uterus is anteverted.  No pelvic mass evident. Other: Appendix appears normal. There is no abscess or ascites in the abdomen or pelvis. There is a minimal ventral hernia containing only fat. There is soft tissue opacification posterior to the paraspinous muscles of the lumbar region slightly deep to the subcutaneous regions, likely due to a degree of panniculitis. There is also subcutaneous edema in this area. Musculoskeletal: There is degenerative change in the lower thoracic and lumbar regions. There is moderate spinal stenosis at L3-4 due to disc protrusion and bony hypertrophy no intramuscular lesions are evident. IMPRESSION: 1.  Small focus of apparent pneumonia posterior right lung base. 2. Descending colonic and sigmoid diverticulosis without frank diverticulitis. No bowel obstruction. No abscess in the abdomen pelvis. Appendix appears normal. 3. No evident renal or ureteral calculus. No hydronephrosis. Urinary bladder wall thickness normal. 4. Extensive aortic and iliac artery atherosclerosis. There appears to be hemodynamically significant narrowing at the origin of the left common iliac artery based on the degree of calcification present in this area. Foci of coronary artery  calcification also noted. 5. Apparent cholelithiasis, likely intermingled with sludge. No gallbladder wall thickening evident by CT. 6.  Hepatic steatosis. 7.  Moderate spinal stenosis at L3-4, multifactorial. 8. 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 Electronically Signed   By: Lowella Grip III M.D.   On: 08/04/2018 10:21   Dg Chest Port 1 View  Result Date: 08/05/2018 CLINICAL DATA:  73 year old female with possible pneumonia in the right costophrenic angle on CT Abdomen and Pelvis yesterday. Persistent abdominal and back pain. EXAM: PORTABLE CHEST 1 VIEW COMPARISON:  CT Abdomen and Pelvis 08/04/2018 and earlier. FINDINGS: Portable AP upright view at 0548 hours. Stable mild  cardiomegaly. Other mediastinal contours are within normal limits. Visualized tracheal air column is within normal limits. Allowing for portable technique the lungs are clear. No pneumothorax or pleural effusion. In the visible upper abdomen there is oral contrast in nondilated transverse colon. No pneumoperitoneum identified. IMPRESSION: 1. No acute cardiopulmonary abnormality by portable x-ray. Suspect right costophrenic angle opacity seen yesterday was atelectasis. 2. Oral contrast from yesterday in nondilated transverse colon. Electronically Signed   By: Genevie Ann M.D.   On: 08/05/2018 06:27   US Abdomen Limited Ruq  Result Date: 08/04/2018 CLINICAL DATA:  Right upper quadrant pain and gallstones on recent CT EXAM: ULTRASOUND ABDOMEN LIMITED RIGHT UPPER QUADRANT COMPARISON:  CT from earlier in the same day. FINDINGS: Gallbladder: Gallbladder is well distended with multiple stones within. No wall thickening or pericholecystic fluid is noted. Negative sonographic Murphy's sign is noted. Common bile duct: Diameter: 6.2 mm. Liver: Increased in echogenicity consistent with fatty infiltration. No focal mass is seen. Portal vein is  patent on color Doppler imaging with normal direction of blood flow towards the liver. IMPRESSION: Fatty liver. Cholelithiasis without complicating factors. Electronically Signed   By: Inez Catalina M.D.   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  Heterozygote factor V 5 Leiden.  PLAN:    1. 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 She received one cycle of Taxotere and Cytoxan, but secondary to significant toxicity this was discontinued.  Patient will complete 5 years of letrozole in December 2020, but given her risk of recurrence, she is agreed to extend treatment and additional 2 years.  Her most recent mammogram on August 31, 2017 was reported as BI-RADS 2.  She has a mammogram scheduled on September 13, 2018.  Return to clinic in 6 months for routine evaluation.    2. Heterozygote factor V 5 Leiden: Patient was diagnosed with PE and DVT at an outside hospital. She completed 6 months of treatment with Xarelto, but given her increased risk she elected to stay on anticoagulation lifelong.  She is now on Coumadin and INR is managed by her primary care physician.  Goal INR is 2.0-3.0.     3. Osteopenia: Bone mineral density on August 31, 2017 reported T score of -2.1 which is unchanged from April 2018.  Continue calcium and vitamin D supplementation.  Repeat on September 13, 2018 along with mammogram.  I provided 15 minutes of non face-to-face telephone visit time during this encounter, and > 50% was spent counseling as documented under my assessment & plan.   Patient expressed understanding and was in agreement with this plan. She also understands that She can call clinic at any time with any questions, concerns, or complaints.   Breast cancer   Staging form: Breast,  AJCC 7th Edition     Clinical stage from 09/22/2014: Stage IA (T1b, N0, M0) - Signed by Lloyd Huger, MD on 09/22/2014   Lloyd Huger, MD   08/31/2018 6:39 AM

## 2018-08-29 ENCOUNTER — Other Ambulatory Visit: Payer: Self-pay

## 2018-08-30 ENCOUNTER — Inpatient Hospital Stay: Payer: Medicare HMO | Attending: Oncology | Admitting: Oncology

## 2018-08-30 DIAGNOSIS — Z9221 Personal history of antineoplastic chemotherapy: Secondary | ICD-10-CM

## 2018-08-30 DIAGNOSIS — D6851 Activated protein C resistance: Secondary | ICD-10-CM | POA: Diagnosis not present

## 2018-08-30 DIAGNOSIS — Z170 Estrogen receptor positive status [ER+]: Secondary | ICD-10-CM | POA: Diagnosis not present

## 2018-08-30 DIAGNOSIS — C50512 Malignant neoplasm of lower-outer quadrant of left female breast: Secondary | ICD-10-CM

## 2018-08-30 DIAGNOSIS — Z86718 Personal history of other venous thrombosis and embolism: Secondary | ICD-10-CM

## 2018-08-30 DIAGNOSIS — M8580 Other specified disorders of bone density and structure, unspecified site: Secondary | ICD-10-CM

## 2018-08-30 DIAGNOSIS — Z7982 Long term (current) use of aspirin: Secondary | ICD-10-CM

## 2018-08-30 DIAGNOSIS — Z7901 Long term (current) use of anticoagulants: Secondary | ICD-10-CM

## 2018-08-30 DIAGNOSIS — Z79899 Other long term (current) drug therapy: Secondary | ICD-10-CM

## 2018-08-30 DIAGNOSIS — Z86711 Personal history of pulmonary embolism: Secondary | ICD-10-CM

## 2018-08-30 NOTE — Progress Notes (Signed)
Patient denies any concerns today.  

## 2018-09-13 ENCOUNTER — Other Ambulatory Visit: Payer: Self-pay | Admitting: Oncology

## 2018-09-13 ENCOUNTER — Ambulatory Visit
Admission: RE | Admit: 2018-09-13 | Discharge: 2018-09-13 | Disposition: A | Discharge status: Home / Self Care | Payer: Medicare HMO | Source: Ambulatory Visit | Attending: Oncology | Admitting: Oncology

## 2018-09-13 ENCOUNTER — Other Ambulatory Visit: Payer: Self-pay

## 2018-09-13 DIAGNOSIS — Z853 Personal history of malignant neoplasm of breast: Secondary | ICD-10-CM | POA: Insufficient documentation

## 2018-09-13 DIAGNOSIS — N63 Unspecified lump in unspecified breast: Secondary | ICD-10-CM

## 2018-09-13 IMAGING — US ULTRASOUND LEFT BREAST LIMITED
1 series · 6 of 6 positions shown · non-contrast
Comparison: Previous exam(s).

CLINICAL DATA: 72-year-old female with a palpable area of concern
in the far medial left breast. History left breast cancer post
lumpectomy [JE].

EXAM:
DIGITAL DIAGNOSTIC BILATERAL MAMMOGRAM WITH CAD AND TOMO
LEFT BREAST ULTRASOUND

[Series 1: ultrasound left breast limited · 0.05mm/px · 6 of 6 slices shown]
[im 1/6]
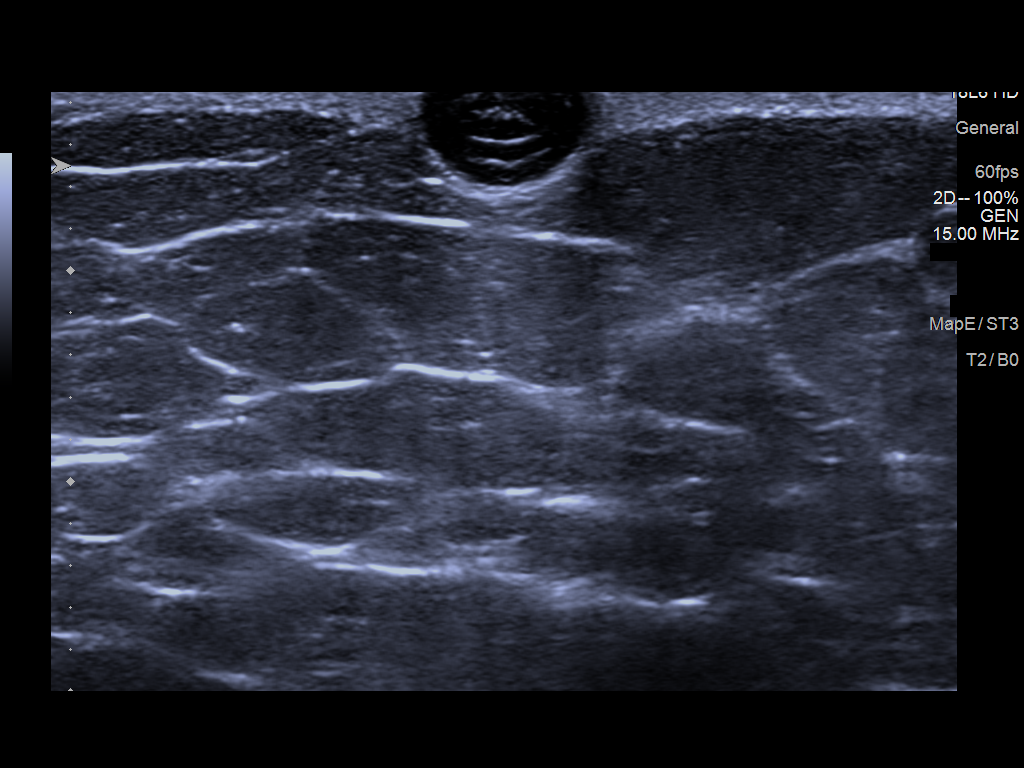
[im 2/6]
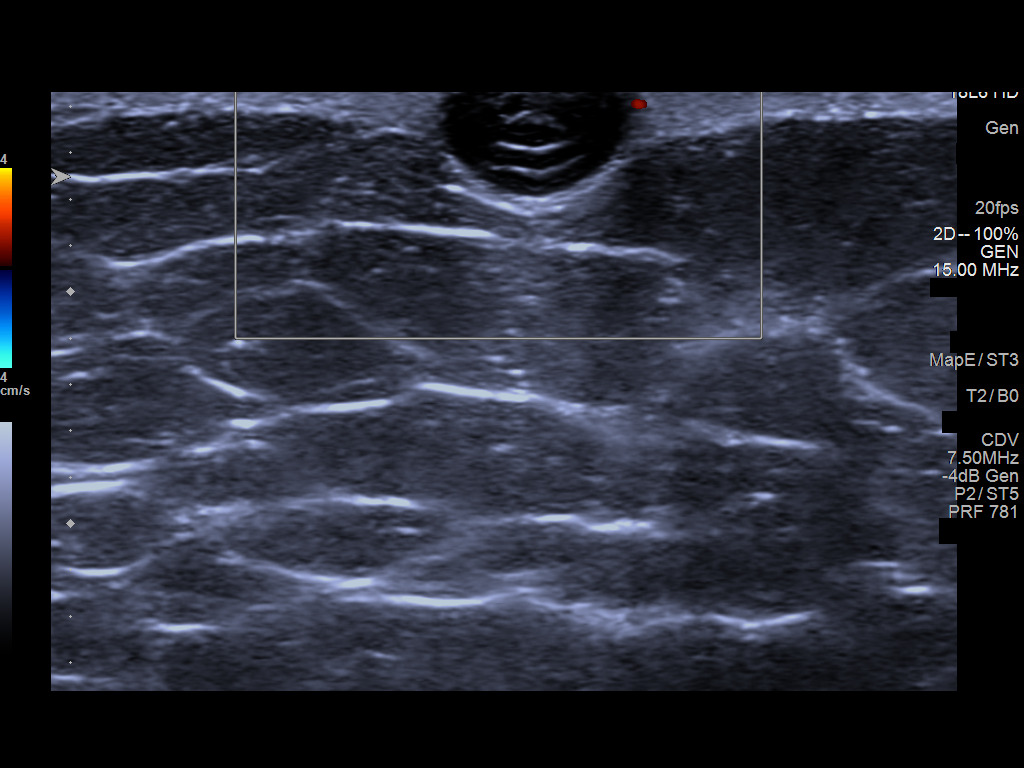
[im 3/6]
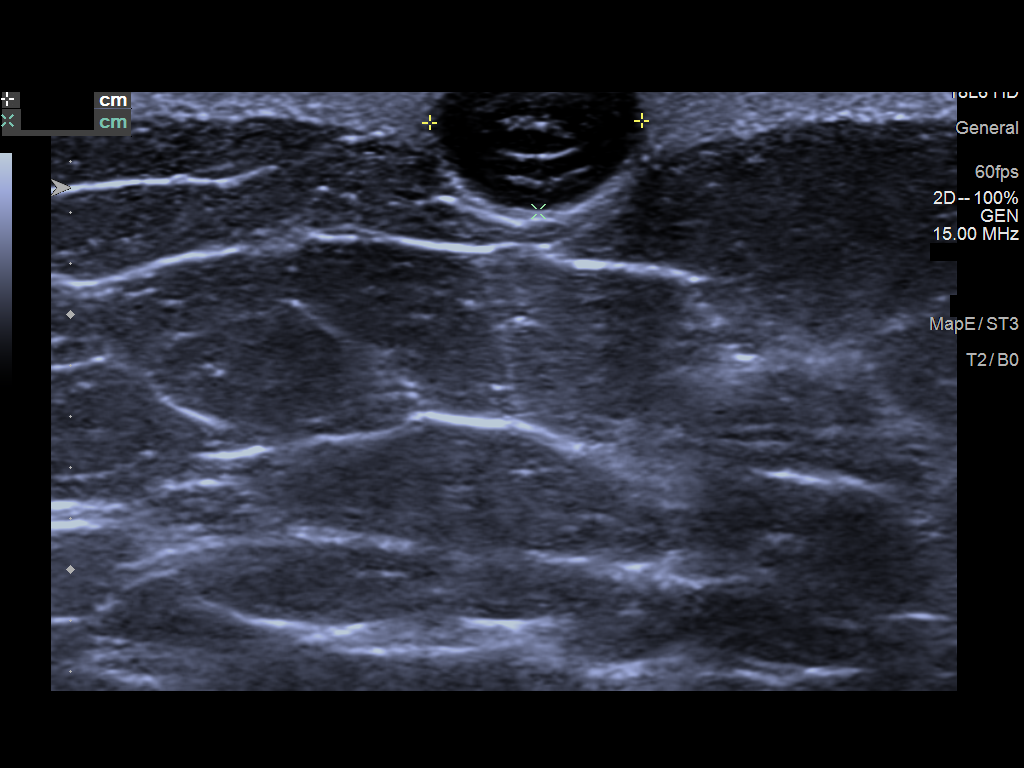
[im 4/6]
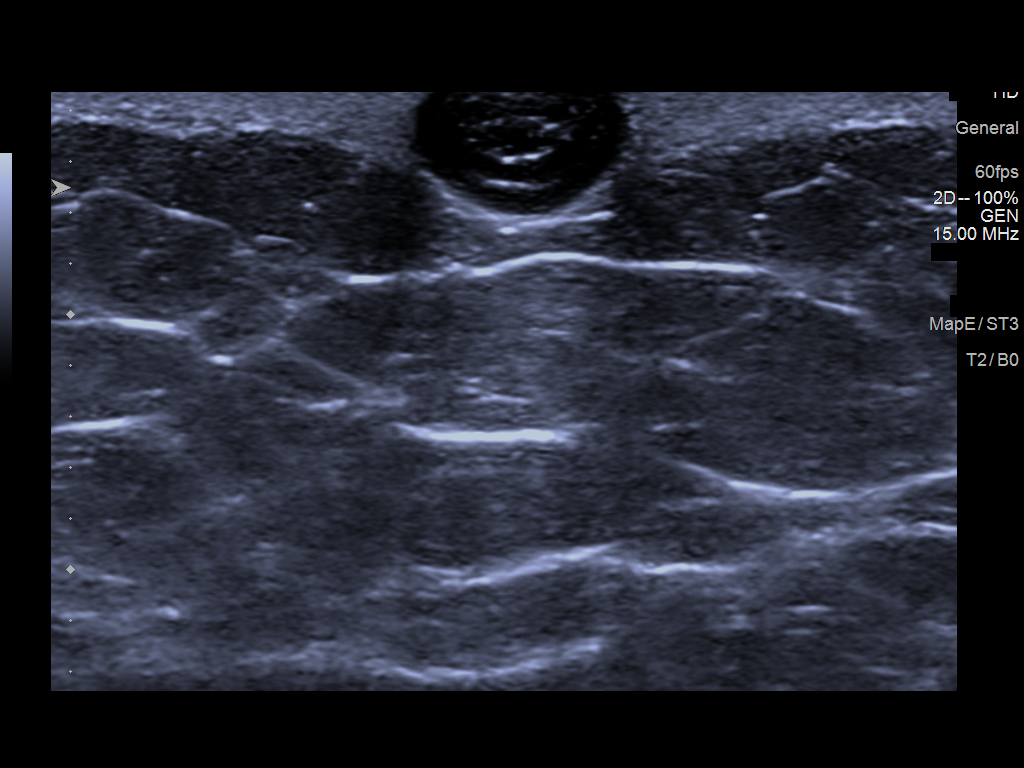
[im 5/6]
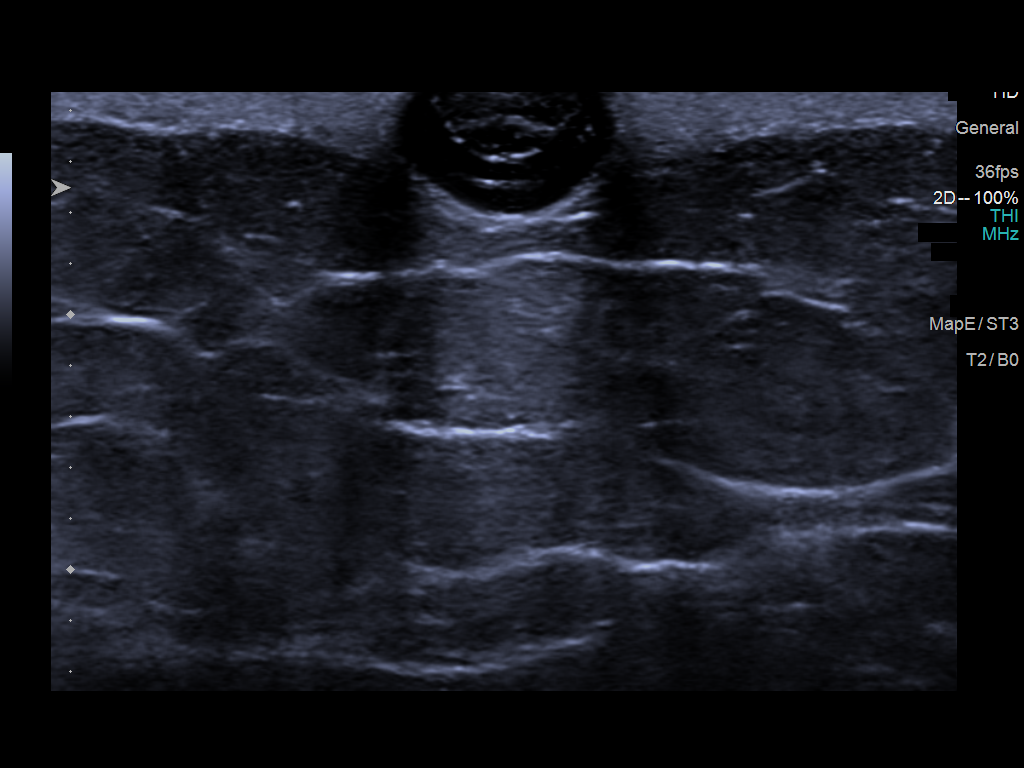
[im 6/6]
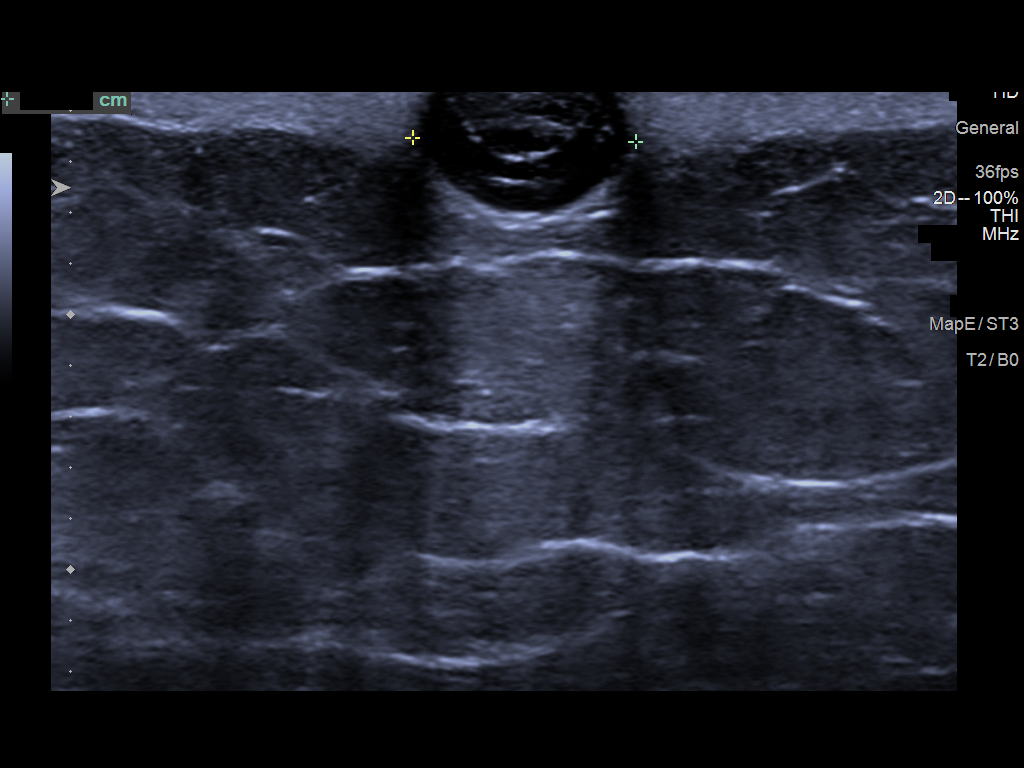

[6 of 6 positions shown; findings below may reference images not displayed]

ACR Breast Density Category b: There are scattered areas of
fibroglandular density.
FINDINGS: No suspicious masses or calcifications are seen in the right breast.
An initially questioned asymmetry seen in the right breast resolves
on the additional imaging compatible with overlapping fibroglandular
tissue. Stable lumpectomy changes in the left breast. Spot
compression tangential tomograms were performed over the palpable
area of concern in the far medial left breast demonstrating an oval
well-circumscribed mass subjacent to the skin measuring
approximately 0.8 cm.

Mammographic images were processed with CAD.

Physical examination of the far inner left breast reveals a firm
mass within the skin. There is a central pore compatible with a
benign sebaceous cyst.

Targeted ultrasound of the left breast was performed. There is an
oval circumscribed hypoechoic mass within the skin at the [DATE]
position 12 cm from nipple measuring 0.8 x 0.6 x 0.9 cm. Findings
are most compatible with a benign sebaceous cyst. This corresponds
with mammography findings.
IMPRESSION: Sebaceous cyst at site of palpable concern in the far medial left
breast. There is no mammographic evidence of malignancy.

RECOMMENDATION:
Screening mammogram in one year.(Code:[JE])

I have discussed the findings and recommendations with the patient.
Results were also provided in writing at the conclusion of the
visit. If applicable, a reminder letter will be sent to the patient
regarding the next appointment.

BI-RADS CATEGORY  2: Benign.

## 2018-09-13 IMAGING — MG DIGITAL DIAGNOSTIC BILATERAL MAMMOGRAM WITH TOMO AND CAD
8 of 14 series · 8 of 40 positions shown · non-contrast
Comparison: Previous exam(s).

CLINICAL DATA: 72-year-old female with a palpable area of concern
in the far medial left breast. History left breast cancer post
lumpectomy [JE].

EXAM:
DIGITAL DIAGNOSTIC BILATERAL MAMMOGRAM WITH CAD AND TOMO
LEFT BREAST ULTRASOUND

[L CC]
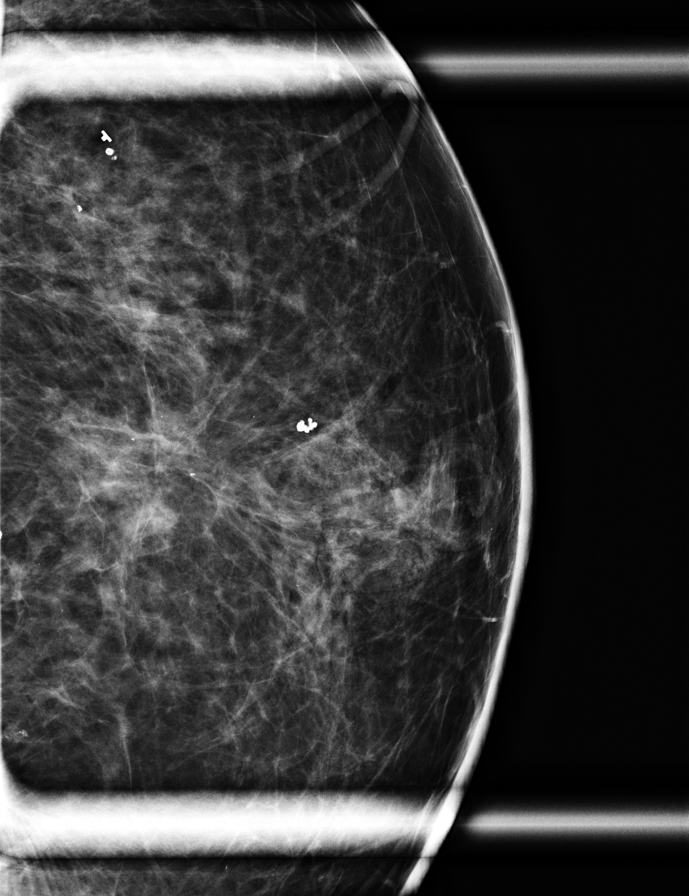

[R CC synth-2D (1 of 2)]
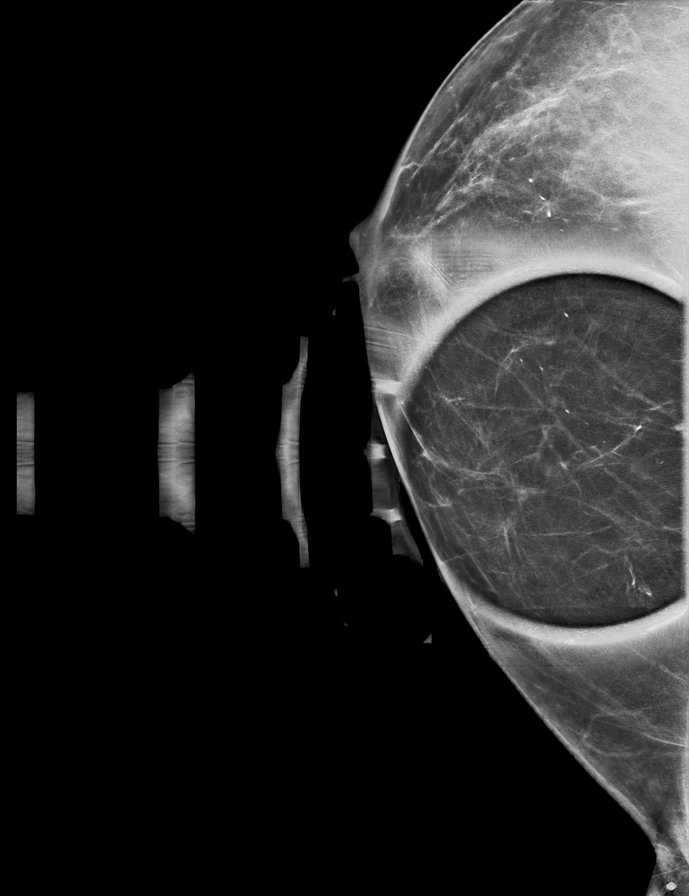

[R CC synth-2D (2 of 2)]
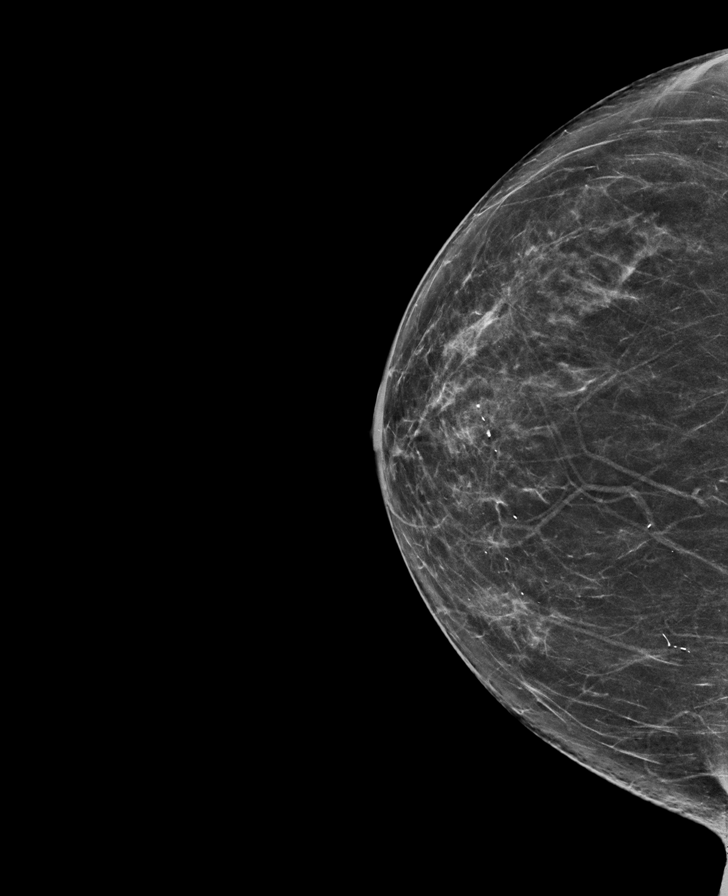

[L MLO synth-2D]
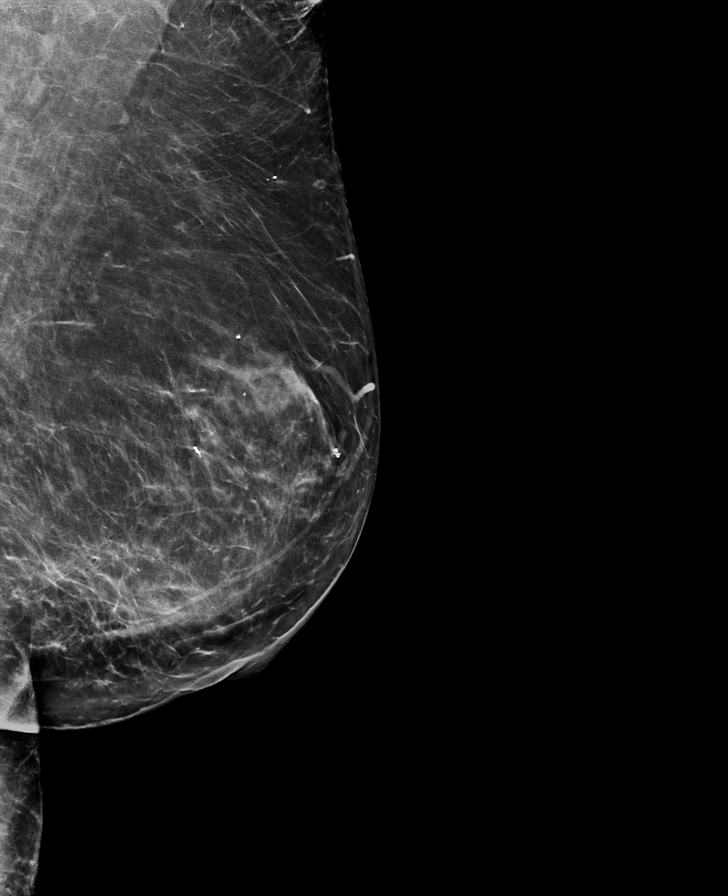

[R MLO synth-2D]
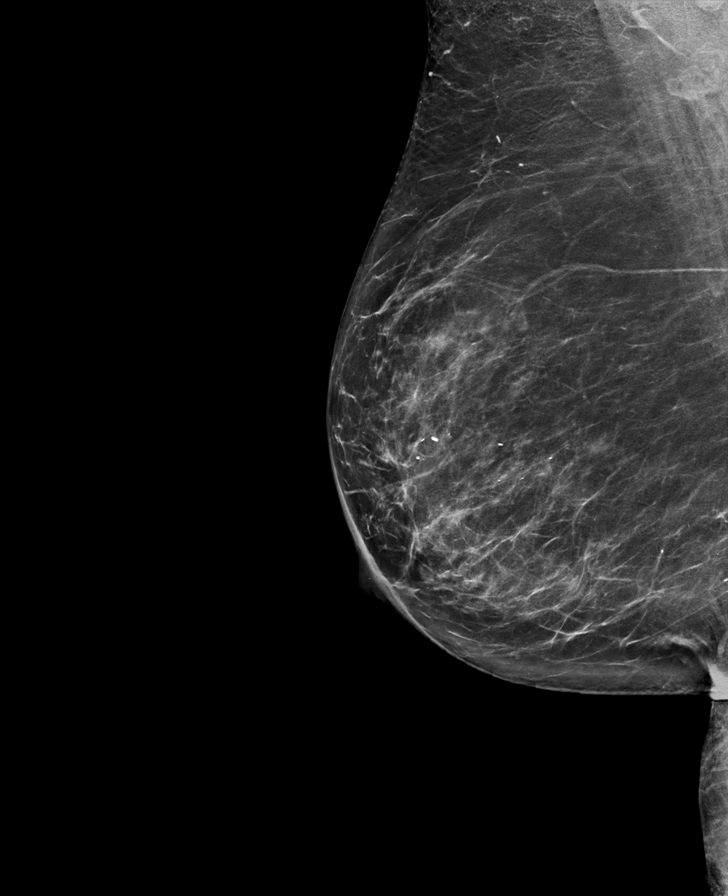

[R ML synth-2D]
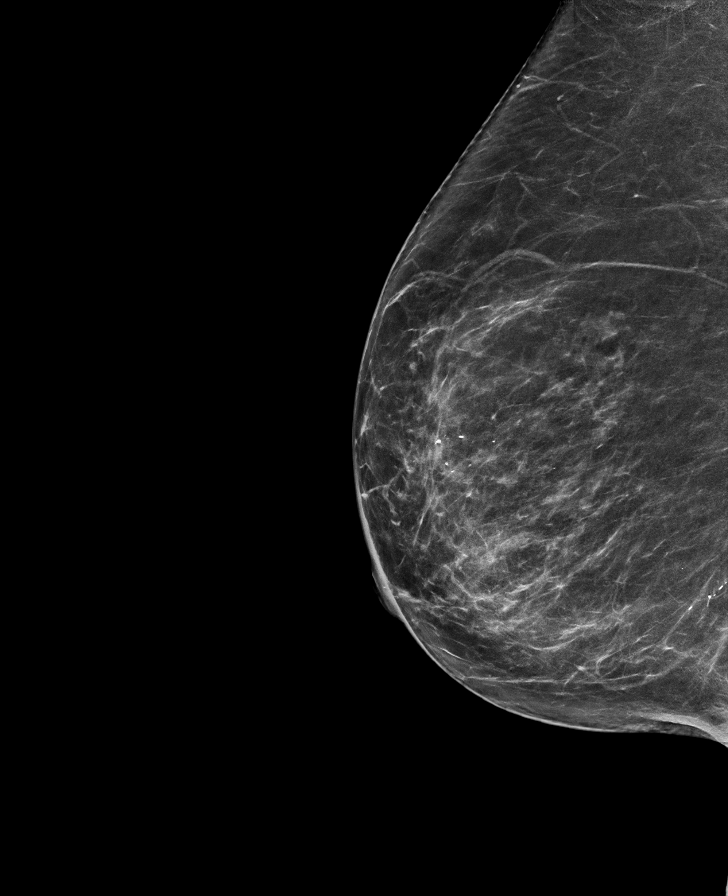

[L CC synth-2D]
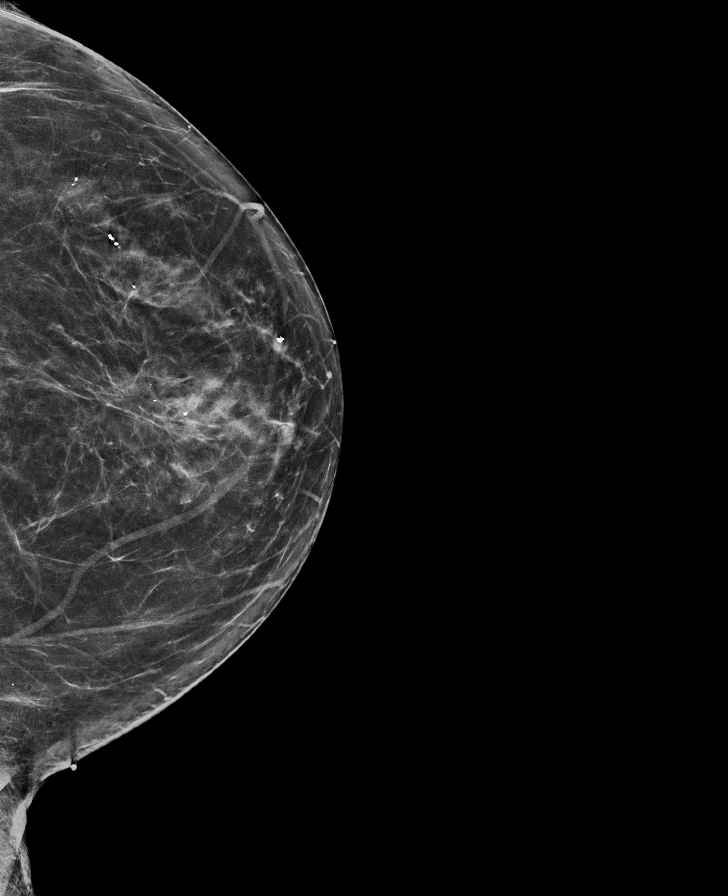

[R MLO tomo · tomo slice 42/83.0]
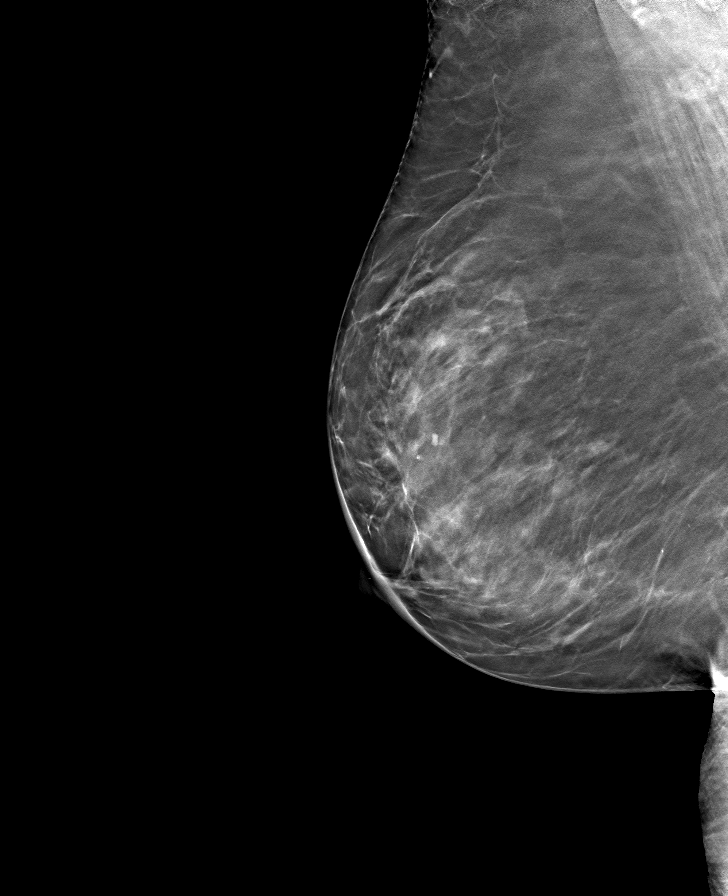

[8 of 40 positions shown; findings below may reference images not displayed]

ACR Breast Density Category b: There are scattered areas of
fibroglandular density.
FINDINGS: No suspicious masses or calcifications are seen in the right breast.
An initially questioned asymmetry seen in the right breast resolves
on the additional imaging compatible with overlapping fibroglandular
tissue. Stable lumpectomy changes in the left breast. Spot
compression tangential tomograms were performed over the palpable
area of concern in the far medial left breast demonstrating an oval
well-circumscribed mass subjacent to the skin measuring
approximately 0.8 cm.

Mammographic images were processed with CAD.

Physical examination of the far inner left breast reveals a firm
mass within the skin. There is a central pore compatible with a
benign sebaceous cyst.

Targeted ultrasound of the left breast was performed. There is an
oval circumscribed hypoechoic mass within the skin at the [DATE]
position 12 cm from nipple measuring 0.8 x 0.6 x 0.9 cm. Findings
are most compatible with a benign sebaceous cyst. This corresponds
with mammography findings.
IMPRESSION: Sebaceous cyst at site of palpable concern in the far medial left
breast. There is no mammographic evidence of malignancy.

RECOMMENDATION:
Screening mammogram in one year.(Code:[JE])

I have discussed the findings and recommendations with the patient.
Results were also provided in writing at the conclusion of the
visit. If applicable, a reminder letter will be sent to the patient
regarding the next appointment.

BI-RADS CATEGORY  2: Benign.

## 2018-10-04 ENCOUNTER — Other Ambulatory Visit: Payer: Self-pay

## 2018-10-04 ENCOUNTER — Ambulatory Visit
Admission: RE | Admit: 2018-10-04 | Discharge: 2018-10-04 | Disposition: A | Discharge status: Home / Self Care | Payer: Medicare HMO | Source: Ambulatory Visit | Attending: Oncology | Admitting: Oncology

## 2018-10-04 DIAGNOSIS — M8589 Other specified disorders of bone density and structure, multiple sites: Secondary | ICD-10-CM | POA: Diagnosis not present

## 2018-10-04 DIAGNOSIS — C50512 Malignant neoplasm of lower-outer quadrant of left female breast: Secondary | ICD-10-CM | POA: Insufficient documentation

## 2018-10-29 ENCOUNTER — Other Ambulatory Visit: Payer: Self-pay | Admitting: Oncology

## 2018-11-15 ENCOUNTER — Other Ambulatory Visit: Payer: Self-pay

## 2018-11-15 ENCOUNTER — Ambulatory Visit (INDEPENDENT_AMBULATORY_CARE_PROVIDER_SITE_OTHER): Payer: Medicare HMO | Admitting: Vascular Surgery

## 2018-11-15 ENCOUNTER — Encounter (INDEPENDENT_AMBULATORY_CARE_PROVIDER_SITE_OTHER): Payer: Self-pay | Admitting: Vascular Surgery

## 2018-11-15 ENCOUNTER — Ambulatory Visit (INDEPENDENT_AMBULATORY_CARE_PROVIDER_SITE_OTHER): Payer: Medicare HMO

## 2018-11-15 VITALS — BP 152/87 | HR 93 | Resp 16 | Ht 65.0 in | Wt 211.0 lb

## 2018-11-15 DIAGNOSIS — I70212 Atherosclerosis of native arteries of extremities with intermittent claudication, left leg: Secondary | ICD-10-CM

## 2018-11-15 DIAGNOSIS — K922 Gastrointestinal hemorrhage, unspecified: Secondary | ICD-10-CM

## 2018-11-15 DIAGNOSIS — D6851 Activated protein C resistance: Secondary | ICD-10-CM | POA: Diagnosis not present

## 2018-11-15 DIAGNOSIS — E785 Hyperlipidemia, unspecified: Secondary | ICD-10-CM

## 2018-11-15 NOTE — Progress Notes (Signed)
MRN : [id number]  Stacy Reese is a 73 y.o. (01/11/1946) female who presents with chief complaint of  Chief Complaint  Patient presents with  . Follow-up    ultrasound follow up  .  History of Present Illness: Patient returns today in follow up of her PAD.  She is doing well with only mild claudication symptoms at current.  A previous CT scan showed some aortoiliac disease.  Today she is studied with lower extremity duplex which shows no hemodynamically significant stenosis in the infra inguinal region in either lower extremity.  Her ABIs are in the normal range at 1.3 on the right and 1.2 on the left.  Current Outpatient Medications  Medication Sig Dispense Refill  . alum & mag hydroxide-simeth (MAALOX/MYLANTA) 200-200-20 MG/5ML suspension Take 30 mLs by mouth every 6 (six) hours as needed for indigestion or heartburn. 355 mL 0  . aspirin EC 81 MG tablet Take 81 mg by mouth daily.     Marland Kitchen buPROPion (WELLBUTRIN SR) 150 MG 12 hr tablet Take 150 mg by mouth daily.     . busPIRone (BUSPAR) 10 MG tablet Take 10 mg by mouth daily.     . citalopram (CELEXA) 20 MG tablet Take 20 mg by mouth daily.     Marland Kitchen diltiazem (TIAZAC) 240 MG 24 hr capsule Take 240 mg by mouth daily.     Marland Kitchen esomeprazole (NEXIUM) 40 MG capsule Take 1 capsule (40 mg total) by mouth daily. 30 capsule 1  . letrozole (FEMARA) 2.5 MG tablet TAKE ONE TABLET BY MOUTH DAILY 90 tablet 3  . LORazepam (ATIVAN) 1 MG tablet Take 1 mg by mouth as needed.     . pantoprazole (PROTONIX) 40 MG tablet Take 1 tablet (40 mg total) by mouth daily. 30 tablet 11  . pravastatin (PRAVACHOL) 40 MG tablet Take 40 mg by mouth daily.     Marland Kitchen triamterene-hydrochlorothiazide (MAXZIDE-25) 37.5-25 MG per tablet Take 1 tablet by mouth daily.     Marland Kitchen warfarin (COUMADIN) 2 MG tablet Take 2 mg by mouth daily.     Marland Kitchen warfarin (COUMADIN) 5 MG tablet Take 5 mg by mouth daily. Takes along with a 2mg  to equal 7mg  daily    . HYDROcodone-acetaminophen (NORCO/VICODIN) 5-325 MG  tablet Take 1 tablet by mouth every 6 (six) hours as needed for moderate pain. (Patient not taking: Reported on 08/23/2018) 15 tablet 0   No current facility-administered medications for this visit.     Past Medical History:  Diagnosis Date  . Atrial fibrillation ([place])   . 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 DVT (deep venous thrombosis) ([place])   . Hypercholesterolemia   . Hypertension   . Personal history of chemotherapy    1 round  . Personal history of radiation therapy   . Skin cancer    squamous cell  . Squamous cell carcinoma of skin    status post exicision    Past Surgical History:  Procedure Laterality Date  . BREAST BIOPSY Left 09/04/2013   negative stereo bx  . BREAST BIOPSY Left 08/25/2013   postive [place] core bx  . BREAST LUMPECTOMY Left 08/2017  . BREAST LUMPECTOMY W/ NEEDLE LOCALIZATION Left 2015  . ESOPHAGOGASTRODUODENOSCOPY (EGD) WITH PROPOFOL N/A 08/05/2018   Procedure: ESOPHAGOGASTRODUODENOSCOPY (EGD) WITH PROPOFOL;  Surgeon: Lucilla Lame, MD;  [facility] END[facility];  Service: Endoscopy;  Laterality: N/A;  . GANGLION CYST EXCISION  Social History Social History   Tobacco Use  . Smoking status: Former Research scientist (life sciences)  . Smokeless tobacco: Never Used  Substance Use Topics  . Alcohol use: Yes    Comment: social  . Drug use: No    Family History Family History  Problem Relation Age of Onset  . Cancer Father        colon  . CAD Father   . Hypertension Father   . Hyperlipidemia Father   . CAD Mother   . Breast cancer Neg Hx     Allergies  Allergen Reactions  . Metoprolol Shortness Of Breath  . Other Other (See Comments)    Sodium pentothal  Causes blood clots    REVIEW OF SYSTEMS (Negative unless checked)  Constitutional: [] ?Weight loss  [] ?Fever  [] ?Chills Cardiac: [] ?Chest pain   [] ?Chest pressure   [x] ?Palpitations   [] ?Shortness of breath when laying flat   [] ?Shortness of breath at rest    [x] ?Shortness of breath with exertion. Vascular:  [x] ?Pain in legs with walking   [] ?Pain in legs at rest   [] ?Pain in legs when laying flat   [x] ?Claudication   [] ?Pain in feet when walking  [] ?Pain in feet at rest  [] ?Pain in feet when laying flat   [x] ?History of DVT   [] ?Phlebitis   [] ?Swelling in legs   [] ?Varicose veins   [] ?Non-healing ulcers Pulmonary:   [] ?Uses home oxygen   [] ?Productive cough   [] ?Hemoptysis   [] ?Wheeze  [] ?COPD   [] ?Asthma Neurologic:  [] ?Dizziness  [] ?Blackouts   [] ?Seizures   [] ?History of stroke   [] ?History of TIA  [] ?Aphasia   [] ?Temporary blindness   [] ?Dysphagia   [] ?Weakness or numbness in arms   [] ?Weakness or numbness in legs Musculoskeletal:  [x] ?Arthritis   [] ?Joint swelling   [] ?Joint pain   [] ?Low back pain Hematologic:  [] ?Easy bruising  [] ?Easy bleeding   [] ?Hypercoagulable state   [x] ?Anemic  [] ?Hepatitis Gastrointestinal:  [x] ?Blood in stool   [] ?Vomiting blood  [] ?Gastroesophageal reflux/heartburn   [x] ?Abdominal pain Genitourinary:  [] ?Chronic kidney disease   [] ?Difficult urination  [] ?Frequent urination  [] ?Burning with urination   [] ?Hematuria Skin:  [] ?Rashes   [] ?Ulcers   [] ?Wounds Psychological:  [] ?History of anxiety   [] ? History of major depression.   Physical Examination  BP (!) 152/87 (BP Location: Right Arm)   Pulse 93   Resp 16   Ht 5\' 5"  (1.651 m)   Wt 211 lb (95.7 kg)   BMI 35.11 kg/m  Gen:  WD/WN, NAD Head: [place]/AT, No temporalis wasting. Ear/Nose/Throat: Hearing grossly intact, nares w/o erythema or drainage Eyes: Conjunctiva clear. Sclera non-icteric Neck: Supple.  Trachea midline Pulmonary:  Good air movement, no use of accessory muscles.  Cardiac: RRR, no JVD Vascular:  Vessel Right Left  Radial Palpable Palpable                          PT Palpable Palpable  DP Palpable Palpable   Gastrointestinal: soft, non-tender/non-distended. No guarding/reflex.  Musculoskeletal: M/S 5/5 throughout.  No deformity or  atrophy.  No lower extremity edema. Neurologic: Sensation grossly intact in extremities.  Symmetrical.  Speech is fluent.  Psychiatric: Judgment intact, Mood & affect appropriate for pt's clinical situation. Dermatologic: No rashes or ulcers noted.  No cellulitis or open wounds.       Labs No results found for this or any previous visit (from the past 2160 hour(s)).  Radiology No results found.  Assessment/Plan Heterozygous factor V Leiden  mutation ([place]) On anticoagulation, history of DVT  Hyperlipidemia lipid control important in reducing the progression of atherosclerotic disease. Continue statin therapy   Upper GI bleed Resolved without major intervention  Atherosclerosis of native arteries of extremity with intermittent claudication (HCC)  A previous CT scan showed some aortoiliac disease.  Today she is studied with lower extremity duplex which shows no hemodynamically significant stenosis in the infra inguinal region in either lower extremity.  Her ABIs are in the normal range at 1.3 on the right and 1.2 on the left.  She is on a statin agent and anticoagulation.  No current worrisome symptoms.  At this point, I will transition her to a once a year follow-up unless worsening symptoms develop in the interim.    Leotis Pain, MD  11/15/2018 11:37 AM    This note was created with Dragon medical transcription system.  Any errors from dictation are purely unintentional

## 2018-11-15 NOTE — Patient Instructions (Signed)
Peripheral Vascular Disease  Peripheral vascular disease (PVD) is a disease of the blood vessels that are not part of your heart and brain. A simple term for PVD is poor circulation. In most cases, PVD narrows the blood vessels that carry blood from your heart to the rest of your body. This can reduce the supply of blood to your arms, legs, and internal organs, like your stomach or kidneys. However, PVD most often affects a person's lower legs and feet. Without treatment, PVD tends to get worse. PVD can also lead to acute ischemic limb. This is when an arm or leg suddenly cannot get enough blood. This is a medical emergency. Follow these instructions at home: Lifestyle  Do not use any products that contain nicotine or tobacco, such as cigarettes and e-cigarettes. If you need help quitting, ask your doctor.  Lose weight if you are overweight. Or, stay at a healthy weight as told by your doctor.  Eat a diet that is low in fat and cholesterol. If you need help, ask your doctor.  Exercise regularly. Ask your doctor for activities that are right for you. General instructions  Take over-the-counter and prescription medicines only as told by your doctor.  Take good care of your feet: ? Wear comfortable shoes that fit well. ? Check your feet often for any cuts or sores.  Keep all follow-up visits as told by your doctor This is important. Contact a doctor if:  You have cramps in your legs when you walk.  You have leg pain when you are at rest.  You have coldness in a leg or foot.  Your skin changes.  You are unable to get or have an erection (erectile dysfunction).  You have cuts or sores on your feet that do not heal. Get help right away if:  Your arm or leg turns cold, numb, and blue.  Your arms or legs become red, warm, swollen, painful, or numb.  You have chest pain.  You have trouble breathing.  You suddenly have weakness in your face, arm, or leg.  You become very  confused or you cannot speak.  You suddenly have a very bad headache.  You suddenly cannot see. Summary  Peripheral vascular disease (PVD) is a disease of the blood vessels.  A simple term for PVD is poor circulation. Without treatment, PVD tends to get worse.  Treatment may include exercise, low fat and low cholesterol diet, and quitting smoking. This information is not intended to replace advice given to you by your health care provider. Make sure you discuss any questions you have with your health care provider. Document Released: 06/03/2009 Document Revised: 02/19/2017 Document Reviewed: 04/16/2016 Elsevier Patient Education  2020 Elsevier Inc.  

## 2018-11-15 NOTE — Assessment & Plan Note (Signed)
A previous CT scan showed some aortoiliac disease.  Today she is studied with lower extremity duplex which shows no hemodynamically significant stenosis in the infra inguinal region in either lower extremity.  Her ABIs are in the normal range at 1.3 on the right and 1.2 on the left.  She is on a statin agent and anticoagulation.  No current worrisome symptoms.  At this point, I will transition her to a once a year follow-up unless worsening symptoms develop in the interim.

## 2019-02-06 ENCOUNTER — Other Ambulatory Visit: Payer: Self-pay

## 2019-02-06 DIAGNOSIS — Z20822 Contact with and (suspected) exposure to covid-19: Secondary | ICD-10-CM

## 2019-02-08 LAB — NOVEL CORONAVIRUS, NAA: SARS-CoV-2, NAA: NOT DETECTED

## 2019-02-26 NOTE — Progress Notes (Deleted)
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  Heterozygote factor V 5 Leiden.   INTERVAL HISTORY: Patient agreed to evaluation by telephone for her routine 53-month follow-up.  She currently feels well and is asymptomatic.  She is tolerating letrozole and Coumadin without significant side effects.  She has no neurologic complaints. She denies any recent fevers or illnesses. She denies any pain. She denies any chest pain, cough, hemoptysis, or shortness of breath. She denies any nausea, vomiting, constipation or diarrhea. She has no melena or hematochezia. She has no urinary complaints.  Patient feels at her baseline offers no specific complaints today.  REVIEW OF SYSTEMS:   Review of Systems  Constitutional: Negative.  Negative for fever, malaise/fatigue and weight loss.  Respiratory: Negative.  Negative for cough and shortness of breath.   Cardiovascular: Negative.  Negative for chest pain and leg swelling.  Gastrointestinal: Negative.  Negative for abdominal pain, blood in stool and melena.  Genitourinary: Negative.  Negative for hematuria.  Musculoskeletal: Negative.  Negative for back pain.  Skin: Negative.  Negative for rash.  Neurological: Negative.  Negative for focal weakness, weakness and headaches.  Endo/Heme/Allergies: Does not bruise/bleed easily.  Psychiatric/Behavioral: Negative.  The patient is not nervous/anxious.     As per HPI. Otherwise, a complete review of systems is negative.  PAST MEDICAL HISTORY: Past Medical History:  Diagnosis Date  . Atrial fibrillation ([place])   . 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 DVT  (deep venous thrombosis) ([place])   . Hypercholesterolemia   . Hypertension   . Personal history of chemotherapy    1 round  . Personal history of radiation therapy   . Skin cancer    squamous cell  . Squamous cell carcinoma of skin    status post exicision    PAST SURGICAL HISTORY: Past Surgical History:  Procedure Laterality Date  . BREAST BIOPSY Left 09/04/2013   negative stereo bx  . BREAST BIOPSY Left 08/25/2013   postive [place] core bx  . BREAST LUMPECTOMY Left 08/2017  . BREAST LUMPECTOMY W/ NEEDLE LOCALIZATION Left 2015  . ESOPHAGOGASTRODUODENOSCOPY (EGD) WITH PROPOFOL N/A 08/05/2018   Procedure: ESOPHAGOGASTRODUODENOSCOPY (EGD) WITH PROPOFOL;  Surgeon: Lucilla Lame, MD;  Location[facility] ENDOSCOPY;  Service: Endoscopy;  Laterality: N/A;  . GANGLION CYST EXCISION      FAMILY HISTORY Family History  Problem Relation Age of Onset  . Cancer Father        colon  . CAD Father   . Hypertension Father   . Hyperlipidemia Father   . CAD Mother   . Breast cancer Neg Hx        ADVANCED DIRECTIVES:    HEALTH MAINTENANCE: Social History   Tobacco Use  . Smoking status: Former Research scientist (life sciences)  . Smokeless tobacco: Never Used  Substance Use Topics  . Alcohol use: Yes    Comment: social  . Drug use: No     Colonoscopy:  PAP:  Bone density:  Lipid panel:  Allergies  Allergen Reactions  . Metoprolol Shortness Of Breath  . Other Other (See Comments)    Sodium pentothal  Causes blood clots    Current Outpatient Medications  Medication Sig Dispense Refill  . alum & mag hydroxide-simeth (MAALOX/MYLANTA) 200-200-20 MG/5ML suspension Take 30 mLs by mouth every 6 (six) hours as needed for indigestion or heartburn. 355 mL 0  . aspirin EC 81 MG tablet Take 81 mg by mouth daily.     Marland Kitchen buPROPion (WELLBUTRIN SR) 150 MG 12 hr tablet Take 150 mg by mouth daily.     . busPIRone (BUSPAR) 10 MG tablet Take 10 mg by mouth daily.     . citalopram (CELEXA) 20 MG tablet Take 20 mg by mouth daily.      Marland Kitchen diltiazem (TIAZAC) 240 MG 24 hr capsule Take 240 mg by mouth daily.     Marland Kitchen esomeprazole (NEXIUM) 40 MG capsule Take 1 capsule (40 mg total) by mouth daily. 30 capsule 1  . HYDROcodone-acetaminophen (NORCO/VICODIN) 5-325 MG tablet Take 1 tablet by mouth every 6 (six) hours as needed for moderate pain. (Patient not taking: Reported on 08/23/2018) 15 tablet 0  . letrozole (FEMARA) 2.5 MG tablet TAKE ONE TABLET BY MOUTH DAILY 90 tablet 3  . LORazepam (ATIVAN) 1 MG tablet Take 1 mg by mouth as needed.     . pantoprazole (PROTONIX) 40 MG tablet Take 1 tablet (40 mg total) by mouth daily. 30 tablet 11  . pravastatin (PRAVACHOL) 40 MG tablet Take 40 mg by mouth daily.     Marland Kitchen triamterene-hydrochlorothiazide (MAXZIDE-25) 37.5-25 MG per tablet Take 1 tablet by mouth daily.     Marland Kitchen warfarin (COUMADIN) 2 MG tablet Take 2 mg by mouth daily.     Marland Kitchen warfarin (COUMADIN) 5 MG tablet Take 5 mg by mouth daily. Takes along with a 2mg  to equal 7mg  daily     No current facility-administered medications for this visit.     OBJECTIVE: There were no vitals filed for this visit.   There is no height or weight on file to calculate BMI.    ECOG FS:0 - Asymptomatic   LAB RESULTS:  Lab Results  Component Value Date   NA 140 08/08/2018   K 4.2 08/08/2018   CL 109 08/08/2018   CO2 23 08/08/2018   GLUCOSE 126 (H) 08/08/2018   BUN 14 08/08/2018   CREATININE 0.75 08/08/2018   CALCIUM 8.6 (L) 08/08/2018   PROT 7.3 08/05/2018   ALBUMIN 3.9 08/05/2018   AST 55 (H) 08/05/2018   ALT 77 (H) 08/05/2018   ALKPHOS 84 08/05/2018   BILITOT 1.1 08/05/2018   GFRNONAA >60 08/08/2018   GFRAA >60 08/08/2018    Lab Results  Component Value Date   WBC 8.7 08/08/2018   NEUTROABS 11.3 (H) 08/05/2018   HGB 11.9 (L) 08/08/2018   HCT 37.2 08/08/2018   MCV 90.1 08/08/2018   PLT 187 08/08/2018     STUDIES: No results found.  ASSESSMENT: Pathologic stage Ia ER positive adenocarcinoma of the lower outer quadrant of the left  breast, Oncotype DX 28.  Heterozygote factor V 5 Leiden.  PLAN:    1. 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 She received one cycle of Taxotere and Cytoxan, but secondary to significant toxicity this was discontinued.  Patient will complete 5 years of letrozole in December 2020, but given her risk of recurrence, she is agreed to extend treatment and additional 2 years.  Her most recent mammogram on August 31, 2017 was reported as BI-RADS 2.  She  has a mammogram scheduled on September 13, 2018.  Return to clinic in 6 months for routine evaluation.    2. Heterozygote factor V 5 Leiden: Patient was diagnosed with PE and DVT at an outside hospital. She completed 6 months of treatment with Xarelto, but given her increased risk she elected to stay on anticoagulation lifelong.  She is now on Coumadin and INR is managed by her primary care physician.  Goal INR is 2.0-3.0.     3. Osteopenia: Bone mineral density on August 31, 2017 reported T score of -2.1 which is unchanged from April 2018.  Continue calcium and vitamin D supplementation.  Repeat on September 13, 2018 along with mammogram.   Patient expressed understanding and was in agreement with this plan. She also understands that She can call clinic at any time with any questions, concerns, or complaints.   Breast cancer   Staging form: Breast, AJCC 7th Edition     Clinical stage from 09/22/2014: Stage IA (T1b, N0, M0) - Signed by Lloyd Huger, MD on 09/22/2014   Lloyd Huger, MD   02/26/2019 8:12 AM

## 2019-03-03 ENCOUNTER — Inpatient Hospital Stay: Payer: Medicare HMO | Admitting: Oncology

## 2019-04-28 NOTE — Progress Notes (Signed)
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  Heterozygote factor V 5 Leiden.   INTERVAL HISTORY: Patient returns to clinic today for routine 19-month evaluation.  She continues to feel well and remains asymptomatic.  She is tolerating letrozole and Coumadin without significant side effects. She has no neurologic complaints. She denies any recent fevers or illnesses. She denies any chest pain, cough, hemoptysis, or shortness of breath. She denies any nausea, vomiting, constipation or diarrhea. She has no melena or hematochezia. She has no urinary complaints.  Patient offers no specific complaints today.  REVIEW OF SYSTEMS:   Review of Systems  Constitutional: Negative.  Negative for fever, malaise/fatigue and weight loss.  Respiratory: Negative.  Negative for cough and shortness of breath.   Cardiovascular: Negative.  Negative for chest pain and leg swelling.  Gastrointestinal: Negative.  Negative for abdominal pain, blood in stool and melena.  Genitourinary: Negative.  Negative for hematuria.  Musculoskeletal: Negative.  Negative for back pain.  Skin: Negative.  Negative for rash.  Neurological: Negative.  Negative for focal weakness, weakness and headaches.  Endo/Heme/Allergies: Does not bruise/bleed easily.  Psychiatric/Behavioral: Negative.  The patient is not nervous/anxious.     As per HPI. Otherwise, a complete review of systems is negative.  PAST MEDICAL HISTORY: Past Medical History:  Diagnosis Date  . Atrial fibrillation ([place])   . Breast cancer ([place]) 08/2013   ER positive adenocarcinoma of Left Breast. with rad tx  . DVT (deep venous thrombosis) ([place])   .  Hypercholesterolemia   . Hypertension   . Personal history of chemotherapy    1 round  . Personal history of radiation therapy   . Skin cancer    squamous cell  . Squamous cell carcinoma of skin    status post exicision    PAST SURGICAL HISTORY: Past Surgical History:  Procedure Laterality Date  . BREAST BIOPSY Left 09/04/2013   negative stereo bx  . BREAST BIOPSY Left 08/25/2013   postive [place] core bx  . BREAST LUMPECTOMY Left 08/2017  . BREAST LUMPECTOMY W/ NEEDLE LOCALIZATION Left 2015  . ESOPHAGOGASTRODUODENOSCOPY (EGD) WITH PROPOFOL N/A 08/05/2018   Procedure: ESOPHAGOGASTRODUODENOSCOPY (EGD) WITH PROPOFOL;  Surgeon: Lucilla Lame, MD;  [facility] ENDOSCOPY;  Service: Endoscopy;  Laterality: N/A;  . GANGLION CYST EXCISION      FAMILY HISTORY Family History  Problem Relation Age of Onset  . Cancer Father        colon  . CAD Father   . Hypertension Father   . Hyperlipidemia Father   . CAD Mother   . Breast cancer Neg Hx        ADVANCED DIRECTIVES:    HEALTH MAINTENANCE: Social History   Tobacco Use  . Smoking status: Former Research scientist (life sciences)  . Smokeless tobacco: Never Used  Substance Use Topics  . Alcohol use: Yes    Comment: social  . Drug use: No     Colonoscopy:  PAP:  Bone density:  Lipid panel:  Allergies  Allergen Reactions  . Metoprolol Shortness Of Breath  . Other Other (See Comments)    Sodium pentothal  Causes blood clots    Current Outpatient Medications  Medication Sig Dispense Refill  . alum & mag  hydroxide-simeth (MAALOX/MYLANTA) 200-200-20 MG/5ML suspension Take 30 mLs by mouth every 6 (six) hours as needed for indigestion or heartburn. 355 mL 0  . aspirin EC 81 MG tablet Take 81 mg by mouth daily.     Marland Kitchen buPROPion (WELLBUTRIN SR) 150 MG 12 hr tablet Take 150 mg by mouth daily.     . busPIRone (BUSPAR) 10 MG tablet Take 10 mg by mouth daily.     . citalopram (CELEXA) 20 MG tablet Take 20 mg by mouth daily.     Marland Kitchen diltiazem (TIAZAC) 240 MG 24  hr capsule Take 240 mg by mouth daily.     Marland Kitchen letrozole (FEMARA) 2.5 MG tablet TAKE ONE TABLET BY MOUTH DAILY 90 tablet 3  . LORazepam (ATIVAN) 1 MG tablet Take 1 mg by mouth as needed.     . pantoprazole (PROTONIX) 40 MG tablet Take 1 tablet (40 mg total) by mouth daily. 30 tablet 11  . pravastatin (PRAVACHOL) 40 MG tablet Take 40 mg by mouth daily.     Marland Kitchen triamterene-hydrochlorothiazide (MAXZIDE-25) 37.5-25 MG per tablet Take 1 tablet by mouth daily.     Marland Kitchen warfarin (COUMADIN) 5 MG tablet Take 5 mg by mouth daily. Takes along with a 2mg  to equal 7mg  daily    . warfarin (COUMADIN) 2 MG tablet Take 2 mg by mouth daily.      No current facility-administered medications for this visit.    OBJECTIVE: Vitals:   05/05/19 1059  BP: (!) 166/118  Pulse: 91  Temp: (!) 97.1 F (36.2 C)     Body mass index is 34.9 kg/m.    ECOG FS:0 - Asymptomatic  General: Well-developed, well-nourished, no acute distress. Eyes: Pink conjunctiva, anicteric sclera. HEENT: Normocephalic, moist mucous membranes. Breast: Exam deferred today. Lungs: No audible wheezing or coughing. Heart: Regular rate and rhythm. Abdomen: Soft, nontender, no obvious distention. Musculoskeletal: No edema, cyanosis, or clubbing. Neuro: Alert, answering all questions appropriately. Cranial nerves grossly intact. Skin: No rashes or petechiae noted. Psych: Normal affect.   LAB RESULTS:  Lab Results  Component Value Date   NA 140 08/08/2018   K 4.2 08/08/2018   CL 109 08/08/2018   CO2 23 08/08/2018   GLUCOSE 126 (H) 08/08/2018   BUN 14 08/08/2018   CREATININE 0.75 08/08/2018   CALCIUM 8.6 (L) 08/08/2018   PROT 7.3 08/05/2018   ALBUMIN 3.9 08/05/2018   AST 55 (H) 08/05/2018   ALT 77 (H) 08/05/2018   ALKPHOS 84 08/05/2018   BILITOT 1.1 08/05/2018   GFRNONAA >60 08/08/2018   GFRAA >60 08/08/2018    Lab Results  Component Value Date   WBC 8.7 08/08/2018   NEUTROABS 11.3 (H) 08/05/2018   HGB 11.9 (L) 08/08/2018   HCT  37.2 08/08/2018   MCV 90.1 08/08/2018   PLT 187 08/08/2018     STUDIES: No results found.  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  Heterozygote factor V 5 Leiden.  PLAN:    1. 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 She received one cycle of Taxotere and Cytoxan, but secondary to significant toxicity this was discontinued.  Continue letrozole for a total of 7 years completing in December 2022.  Her most recent mammogram on September 13, 2018 was reported as BI-RADS 2.  Will repeat in  July 2021 along with bone mineral density.  Return to clinic 1 to 2 days after her mammogram to discuss the results.  2. Heterozygote factor V 5 Leiden: Patient was diagnosed with PE and DVT at an outside hospital. She completed 6 months of treatment with Xarelto, but given her increased risk she elected to stay on anticoagulation lifelong.  She is now on Coumadin and INR is managed by her primary care physician.  Goal INR is 2.0-3.0.     3. Osteopenia: Patient's most recent bone mineral density on October 04, 2018 reported T score of -2.3. This is slightly worse than 1 year prior where the T score was reported -2.1.  We discussed the possibility of initiating Fosamax if her next bone mineral density in July 2021 continues to get worse.  Patient admits she has poor compliance with calcium and vitamin D supplementation, but has agreed to improve her dietary intake.     Patient expressed understanding and was in agreement with this plan. She also understands that She can call clinic at any time with any questions, concerns, or complaints.   Breast cancer   Staging form: Breast, AJCC 7th Edition     Clinical stage from 09/22/2014: Stage IA (T1b, N0, M0) - Signed by Lloyd Huger, MD on  09/22/2014   Lloyd Huger, MD   05/06/2019 8:05 AM

## 2019-05-04 ENCOUNTER — Encounter: Payer: Self-pay | Admitting: Oncology

## 2019-05-04 NOTE — Progress Notes (Signed)
Patient reports she is having increased difficulty with memory.

## 2019-05-05 ENCOUNTER — Other Ambulatory Visit: Payer: Self-pay

## 2019-05-05 ENCOUNTER — Inpatient Hospital Stay: Payer: Medicare HMO | Attending: Oncology | Admitting: Oncology

## 2019-05-05 VITALS — BP 166/118 | HR 91 | Temp 97.1°F | Wt 209.7 lb

## 2019-05-05 DIAGNOSIS — Z8249 Family history of ischemic heart disease and other diseases of the circulatory system: Secondary | ICD-10-CM | POA: Diagnosis not present

## 2019-05-05 DIAGNOSIS — Z800 Family history of malignant neoplasm of digestive organs: Secondary | ICD-10-CM | POA: Insufficient documentation

## 2019-05-05 DIAGNOSIS — M8580 Other specified disorders of bone density and structure, unspecified site: Secondary | ICD-10-CM | POA: Diagnosis not present

## 2019-05-05 DIAGNOSIS — Z7982 Long term (current) use of aspirin: Secondary | ICD-10-CM | POA: Diagnosis not present

## 2019-05-05 DIAGNOSIS — Z7901 Long term (current) use of anticoagulants: Secondary | ICD-10-CM | POA: Insufficient documentation

## 2019-05-05 DIAGNOSIS — Z170 Estrogen receptor positive status [ER+]: Secondary | ICD-10-CM | POA: Diagnosis not present

## 2019-05-05 DIAGNOSIS — E7800 Pure hypercholesterolemia, unspecified: Secondary | ICD-10-CM | POA: Diagnosis not present

## 2019-05-05 DIAGNOSIS — Z87891 Personal history of nicotine dependence: Secondary | ICD-10-CM | POA: Diagnosis not present

## 2019-05-05 DIAGNOSIS — Z8349 Family history of other endocrine, nutritional and metabolic diseases: Secondary | ICD-10-CM | POA: Diagnosis not present

## 2019-05-05 DIAGNOSIS — Z86718 Personal history of other venous thrombosis and embolism: Secondary | ICD-10-CM | POA: Insufficient documentation

## 2019-05-05 DIAGNOSIS — Z9221 Personal history of antineoplastic chemotherapy: Secondary | ICD-10-CM | POA: Insufficient documentation

## 2019-05-05 DIAGNOSIS — Z85828 Personal history of other malignant neoplasm of skin: Secondary | ICD-10-CM | POA: Insufficient documentation

## 2019-05-05 DIAGNOSIS — Z79811 Long term (current) use of aromatase inhibitors: Secondary | ICD-10-CM | POA: Insufficient documentation

## 2019-05-05 DIAGNOSIS — C50512 Malignant neoplasm of lower-outer quadrant of left female breast: Secondary | ICD-10-CM

## 2019-05-05 DIAGNOSIS — Z923 Personal history of irradiation: Secondary | ICD-10-CM | POA: Insufficient documentation

## 2019-05-05 DIAGNOSIS — Z79899 Other long term (current) drug therapy: Secondary | ICD-10-CM | POA: Insufficient documentation

## 2019-05-05 DIAGNOSIS — I4891 Unspecified atrial fibrillation: Secondary | ICD-10-CM | POA: Diagnosis not present

## 2019-05-05 DIAGNOSIS — I10 Essential (primary) hypertension: Secondary | ICD-10-CM | POA: Insufficient documentation

## 2019-06-29 ENCOUNTER — Ambulatory Visit: Payer: Medicare HMO | Admitting: Dermatology

## 2019-06-29 ENCOUNTER — Encounter: Payer: Self-pay | Admitting: Dermatology

## 2019-06-29 ENCOUNTER — Other Ambulatory Visit: Payer: Self-pay

## 2019-06-29 DIAGNOSIS — C44729 Squamous cell carcinoma of skin of left lower limb, including hip: Secondary | ICD-10-CM

## 2019-06-29 DIAGNOSIS — Z85828 Personal history of other malignant neoplasm of skin: Secondary | ICD-10-CM | POA: Diagnosis not present

## 2019-06-29 DIAGNOSIS — L578 Other skin changes due to chronic exposure to nonionizing radiation: Secondary | ICD-10-CM | POA: Diagnosis not present

## 2019-06-29 DIAGNOSIS — L820 Inflamed seborrheic keratosis: Secondary | ICD-10-CM

## 2019-06-29 DIAGNOSIS — D492 Neoplasm of unspecified behavior of bone, soft tissue, and skin: Secondary | ICD-10-CM

## 2019-06-29 NOTE — Patient Instructions (Signed)

## 2019-06-29 NOTE — Progress Notes (Signed)
Follow-Up Visit   Subjective  Stacy Reese is a 74 y.o. female who presents for the following: growth (L posterior leg growth recently appeared).   The following portions of the chart were reviewed this encounter and updated as appropriate: Tobacco  Allergies  Meds  Problems  Med Hx  Surg Hx  Fam Hx      Review of Systems: No other skin or systemic complaints.  Objective  Well appearing patient in no apparent distress; mood and affect are within normal limits.  A focused examination was performed including chest, axillae, abdomen, back, and buttocks and L leg,R Leg . Relevant physical exam findings are noted in the Assessment and Plan.  Objective  Right calf (2): Erythematous keratotic or waxy stuck-on papule or plaque.   Objective  Left calf: 1.0 cm hyperkeratotic nodule   Assessment & Plan   Actinic Damage - diffuse scaly erythematous macules with underlying dyspigmentation - Recommend daily broad spectrum sunscreen SPF 30+ to sun-exposed areas, reapply every 2 hours as needed.  - Call for new or changing lesions. Varicose Veins - Dilated blue, purple or red veins at the lower extremities - Reassured - These can be treated by sclerotherapy (a procedure to inject a medicine into the veins to make them disappear) if desired, but the treatment is not covered by insurance  Inflamed seborrheic keratosis (2) Right calf  Destruction of lesion - Right calf Complexity: simple   Destruction method: cryotherapy   Informed consent: discussed and consent obtained   Timeout:  patient name, date of birth, surgical site, and procedure verified Lesion destroyed using liquid nitrogen: Yes   Region frozen until ice ball extended beyond lesion: Yes   Outcome: patient tolerated procedure well with no complications   Post-procedure details: wound care instructions given    Neoplasm of skin Left calf  Epidermal / dermal shaving  Lesion length (cm):  1 Lesion width (cm):   1 Margin per side (cm):  0.2 Total excision diameter (cm):  1.4 Informed consent: discussed and consent obtained   Timeout: patient name, date of birth, surgical site, and procedure verified   Procedure prep:  Patient was prepped and draped in usual sterile fashion Prep type:  Isopropyl alcohol Anesthesia: the lesion was anesthetized in a standard fashion   Anesthetic:  1% lidocaine w/ epinephrine 1-100,000 buffered w/ 8.4% NaHCO3 Hemostasis achieved with: pressure, aluminum chloride and electrodesiccation   Outcome: patient tolerated procedure well   Post-procedure details: sterile dressing applied and wound care instructions given   Dressing type: bandage and petrolatum    Destruction of lesion Complexity: extensive   Destruction method: electrodesiccation and curettage   Informed consent: discussed and consent obtained   Timeout:  patient name, date of birth, surgical site, and procedure verified Procedure prep:  Patient was prepped and draped in usual sterile fashion Prep type:  Isopropyl alcohol Anesthesia: the lesion was anesthetized in a standard fashion   Anesthetic:  1% lidocaine w/ epinephrine 1-100,000 buffered w/ 8.4% NaHCO3 Curettage performed in three different directions: Yes   Electrodesiccation performed over the curetted area: Yes   Lesion length (cm):  1 Lesion width (cm):  1 Margin per side (cm):  0.2 Final wound size (cm):  1.4 Hemostasis achieved with:  pressure, aluminum chloride and electrodesiccation Outcome: patient tolerated procedure well with no complications   Post-procedure details: sterile dressing applied and wound care instructions given   Dressing type: bandage and petrolatum    Specimen 1 - Surgical pathology Differential  Diagnosis:  Check Margins: No  Return in about 3 months (around 09/28/2019) for hx of SCC.

## 2019-07-03 ENCOUNTER — Telehealth: Payer: Self-pay

## 2019-07-03 NOTE — Telephone Encounter (Signed)
Copy pathology results mailed to pt

## 2019-07-03 NOTE — Telephone Encounter (Signed)
Discussed biopsy results with pt  °

## 2019-07-07 ENCOUNTER — Other Ambulatory Visit: Payer: Self-pay | Admitting: Orthopedic Surgery

## 2019-07-10 ENCOUNTER — Other Ambulatory Visit: Payer: Self-pay

## 2019-07-10 ENCOUNTER — Other Ambulatory Visit
Admission: RE | Admit: 2019-07-10 | Discharge: 2019-07-10 | Disposition: A | Discharge status: Home / Self Care | Payer: Medicare HMO | Source: Ambulatory Visit | Attending: Orthopedic Surgery | Admitting: Orthopedic Surgery

## 2019-07-10 DIAGNOSIS — Z20822 Contact with and (suspected) exposure to covid-19: Secondary | ICD-10-CM | POA: Insufficient documentation

## 2019-07-10 DIAGNOSIS — Z01812 Encounter for preprocedural laboratory examination: Secondary | ICD-10-CM | POA: Insufficient documentation

## 2019-07-11 ENCOUNTER — Ambulatory Visit
Admission: RE | Admit: 2019-07-11 | Discharge: 2019-07-11 | Disposition: A | Discharge status: Home / Self Care | Payer: Medicare HMO | Attending: Orthopedic Surgery | Admitting: Orthopedic Surgery

## 2019-07-11 ENCOUNTER — Ambulatory Visit: Payer: Medicare HMO | Admitting: Certified Registered"

## 2019-07-11 ENCOUNTER — Other Ambulatory Visit: Payer: Self-pay

## 2019-07-11 ENCOUNTER — Encounter
Admission: RE | Disposition: A | Discharge status: Home / Self Care | Payer: Self-pay | Source: Home / Self Care | Attending: Orthopedic Surgery

## 2019-07-11 ENCOUNTER — Ambulatory Visit: Payer: Medicare HMO

## 2019-07-11 ENCOUNTER — Encounter: Payer: Self-pay | Admitting: Orthopedic Surgery

## 2019-07-11 DIAGNOSIS — S52572A Other intraarticular fracture of lower end of left radius, initial encounter for closed fracture: Secondary | ICD-10-CM | POA: Diagnosis present

## 2019-07-11 DIAGNOSIS — Z853 Personal history of malignant neoplasm of breast: Secondary | ICD-10-CM | POA: Diagnosis not present

## 2019-07-11 DIAGNOSIS — E7800 Pure hypercholesterolemia, unspecified: Secondary | ICD-10-CM | POA: Insufficient documentation

## 2019-07-11 DIAGNOSIS — Z79899 Other long term (current) drug therapy: Secondary | ICD-10-CM | POA: Diagnosis not present

## 2019-07-11 DIAGNOSIS — Z86718 Personal history of other venous thrombosis and embolism: Secondary | ICD-10-CM | POA: Insufficient documentation

## 2019-07-11 DIAGNOSIS — E1151 Type 2 diabetes mellitus with diabetic peripheral angiopathy without gangrene: Secondary | ICD-10-CM | POA: Insufficient documentation

## 2019-07-11 DIAGNOSIS — Z9889 Other specified postprocedural states: Secondary | ICD-10-CM

## 2019-07-11 DIAGNOSIS — Z87891 Personal history of nicotine dependence: Secondary | ICD-10-CM | POA: Insufficient documentation

## 2019-07-11 DIAGNOSIS — E669 Obesity, unspecified: Secondary | ICD-10-CM | POA: Diagnosis not present

## 2019-07-11 DIAGNOSIS — Z8781 Personal history of (healed) traumatic fracture: Secondary | ICD-10-CM

## 2019-07-11 DIAGNOSIS — Z7901 Long term (current) use of anticoagulants: Secondary | ICD-10-CM | POA: Diagnosis not present

## 2019-07-11 DIAGNOSIS — Z86711 Personal history of pulmonary embolism: Secondary | ICD-10-CM | POA: Insufficient documentation

## 2019-07-11 DIAGNOSIS — Z79811 Long term (current) use of aromatase inhibitors: Secondary | ICD-10-CM | POA: Diagnosis not present

## 2019-07-11 DIAGNOSIS — W19XXXA Unspecified fall, initial encounter: Secondary | ICD-10-CM | POA: Diagnosis not present

## 2019-07-11 DIAGNOSIS — Z7982 Long term (current) use of aspirin: Secondary | ICD-10-CM | POA: Diagnosis not present

## 2019-07-11 DIAGNOSIS — Z888 Allergy status to other drugs, medicaments and biological substances status: Secondary | ICD-10-CM | POA: Insufficient documentation

## 2019-07-11 DIAGNOSIS — S52612A Displaced fracture of left ulna styloid process, initial encounter for closed fracture: Secondary | ICD-10-CM | POA: Diagnosis not present

## 2019-07-11 DIAGNOSIS — I4891 Unspecified atrial fibrillation: Secondary | ICD-10-CM | POA: Insufficient documentation

## 2019-07-11 DIAGNOSIS — Z01810 Encounter for preprocedural cardiovascular examination: Secondary | ICD-10-CM

## 2019-07-11 DIAGNOSIS — Z6834 Body mass index (BMI) 34.0-34.9, adult: Secondary | ICD-10-CM | POA: Insufficient documentation

## 2019-07-11 DIAGNOSIS — I10 Essential (primary) hypertension: Secondary | ICD-10-CM | POA: Insufficient documentation

## 2019-07-11 HISTORY — PX: OPEN REDUCTION INTERNAL FIXATION (ORIF) DISTAL RADIAL FRACTURE: SHX5989

## 2019-07-11 LAB — PROTIME-INR
INR: 1.3 — ABNORMAL HIGH (ref 0.8–1.2)
Prothrombin Time: 15.9 seconds — ABNORMAL HIGH (ref 11.4–15.2)

## 2019-07-11 LAB — CBC
HCT: 41.9 % (ref 36.0–46.0)
Hemoglobin: 14.5 g/dL (ref 12.0–15.0)
MCH: 28.7 pg (ref 26.0–34.0)
MCHC: 34.6 g/dL (ref 30.0–36.0)
MCV: 82.8 fL (ref 80.0–100.0)
Platelets: 236 10*3/uL (ref 150–400)
RBC: 5.06 MIL/uL (ref 3.87–5.11)
RDW: 13.8 % (ref 11.5–15.5)
WBC: 7.6 10*3/uL (ref 4.0–10.5)
nRBC: 0 % (ref 0.0–0.2)

## 2019-07-11 LAB — BASIC METABOLIC PANEL
Anion gap: 11 (ref 5–15)
BUN: 13 mg/dL (ref 8–23)
CO2: 24 mmol/L (ref 22–32)
Calcium: 9.2 mg/dL (ref 8.9–10.3)
Chloride: 103 mmol/L (ref 98–111)
Creatinine, Ser: 0.78 mg/dL (ref 0.44–1.00)
GFR calc Af Amer: >60 mL/min (ref >60)
GFR calc non Af Amer: >60 mL/min (ref >60)
Glucose, Bld: 147 mg/dL — ABNORMAL HIGH (ref 70–99)
Potassium: 3.4 mmol/L — ABNORMAL LOW (ref 3.5–5.1)
Sodium: 138 mmol/L (ref 135–145)

## 2019-07-11 LAB — SARS CORONAVIRUS 2 (TAT 6-24 HRS): SARS Coronavirus 2: NEGATIVE

## 2019-07-11 IMAGING — DX DG WRIST 2V*L*
2 series · 2 of 2 positions shown · non-contrast
Comparison: None.

CLINICAL DATA: Status post open reduction internal fixation of left
wrist fracture.

EXAM:
LEFT WRIST - 2 VIEW

[wrist ap]
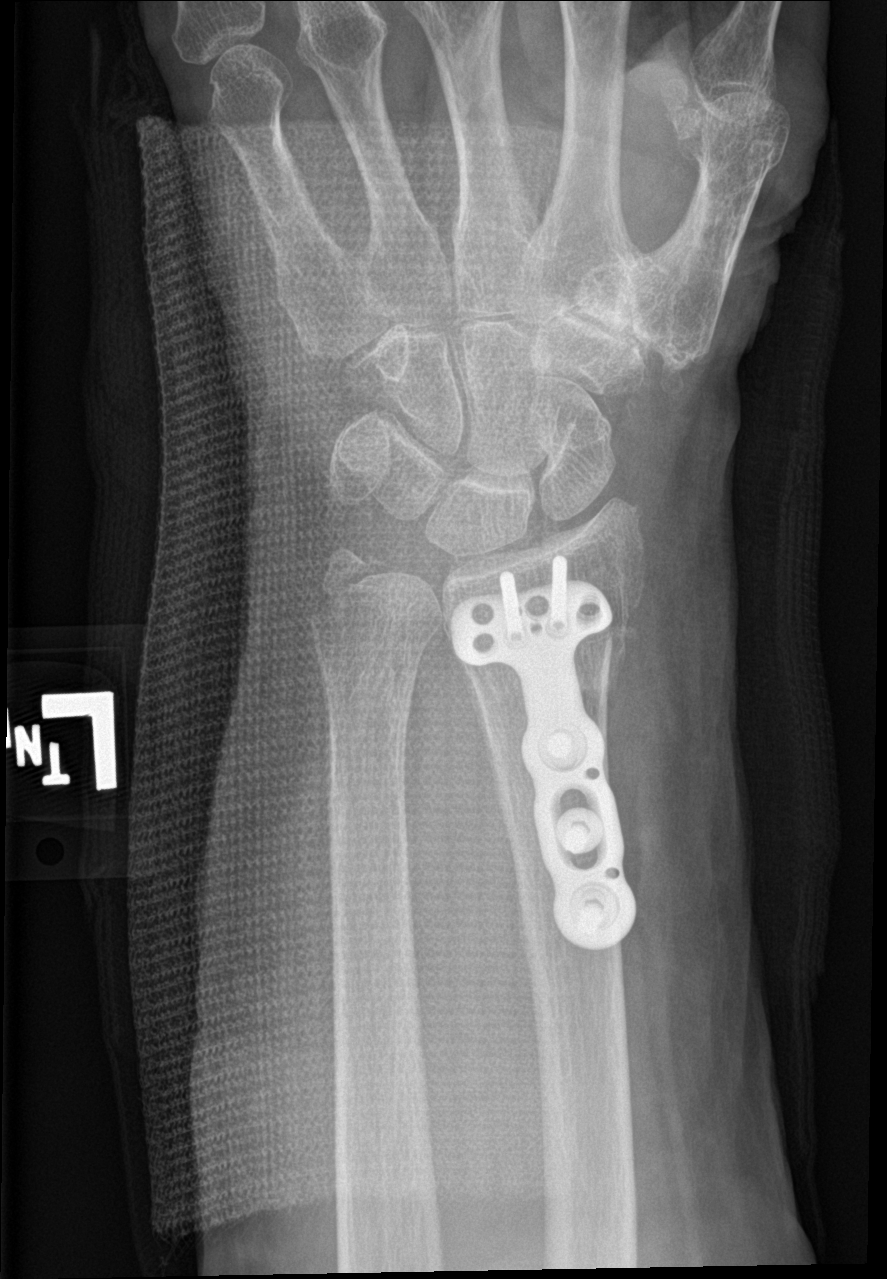

[wrist lat]
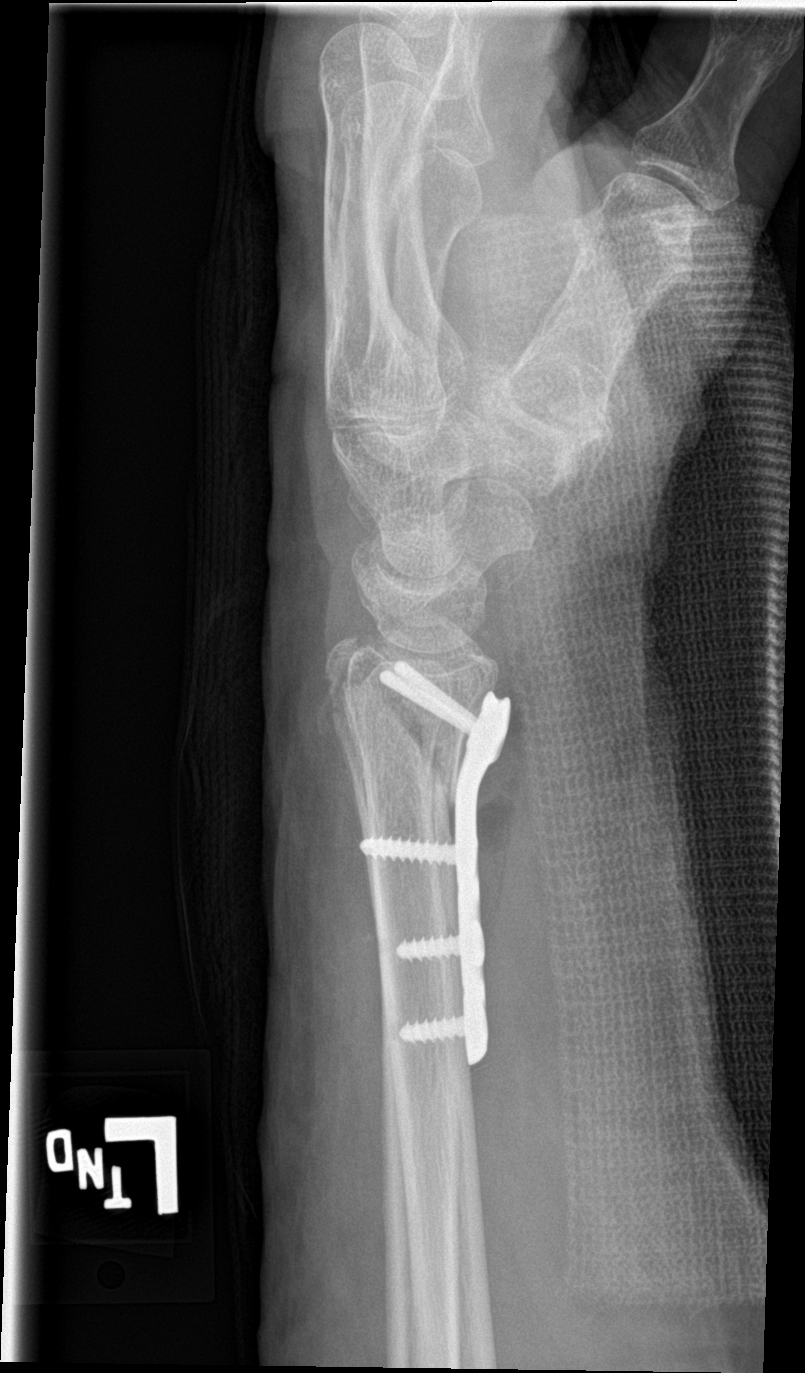

[2 of 2 positions shown; findings below may reference images not displayed]

FINDINGS: The left wrist has been splinted and immobilized. Status post
surgical internal fixation of distal left radius. Good alignment of
fracture components is noted. Mildly displaced ulnar styloid
fracture is noted.
IMPRESSION: Status post surgical internal fixation of distal left radial
fracture. Mildly displaced ulnar styloid fracture is noted.

## 2019-07-11 SURGERY — OPEN REDUCTION INTERNAL FIXATION (ORIF) DISTAL RADIUS FRACTURE
Anesthesia: General | Site: Wrist | Laterality: Left

## 2019-07-11 MED ORDER — PROPOFOL 10 MG/ML IV BOLUS
INTRAVENOUS | Status: DC | PRN
  Administered 2019-07-11: 20 mg via INTRAVENOUS
  Administered 2019-07-11: 160 mg via INTRAVENOUS

## 2019-07-11 MED ORDER — FENTANYL CITRATE (PF) 100 MCG/2ML IJ SOLN
INTRAMUSCULAR | Status: DC | PRN
  Administered 2019-07-11: 25 ug via INTRAVENOUS
  Administered 2019-07-11: 50 ug via INTRAVENOUS
  Administered 2019-07-11: 25 ug via INTRAVENOUS

## 2019-07-11 MED ORDER — DEXAMETHASONE SODIUM PHOSPHATE 10 MG/ML IJ SOLN
INTRAMUSCULAR | Status: AC
  Filled 2019-07-11: qty 1

## 2019-07-11 MED ORDER — PHENYLEPHRINE HCL (PRESSORS) 10 MG/ML IV SOLN
INTRAVENOUS | Status: AC
  Filled 2019-07-11: qty 1

## 2019-07-11 MED ORDER — ACETAMINOPHEN 10 MG/ML IV SOLN
INTRAVENOUS | Status: AC
  Filled 2019-07-11: qty 100

## 2019-07-11 MED ORDER — METOCLOPRAMIDE HCL 10 MG PO TABS: 5.0000 mg | ORAL_TABLET | Freq: Three times a day (TID) | ORAL | Status: DC | PRN

## 2019-07-11 MED ORDER — CEFAZOLIN SODIUM-DEXTROSE 2-4 GM/100ML-% IV SOLN
INTRAVENOUS | Status: AC
  Filled 2019-07-11: qty 100

## 2019-07-11 MED ORDER — OXYCODONE HCL 5 MG PO TABS
5.0000 mg | ORAL_TABLET | Freq: Once | ORAL | Status: AC | PRN
  Administered 2019-07-11: 5 mg via ORAL

## 2019-07-11 MED ORDER — ONDANSETRON HCL 4 MG/2ML IJ SOLN
INTRAMUSCULAR | Status: AC
  Filled 2019-07-11: qty 2

## 2019-07-11 MED ORDER — HYDROCODONE-ACETAMINOPHEN 5-325 MG PO TABS: 1.0000 | ORAL_TABLET | Freq: Four times a day (QID) | ORAL | 0 refills | Status: DC | PRN

## 2019-07-11 MED ORDER — DILTIAZEM HCL ER COATED BEADS 240 MG PO CP24
240.0000 mg | ORAL_CAPSULE | Freq: Once | ORAL | Status: AC
  Administered 2019-07-11: 09:00:00 240 mg via ORAL
  Filled 2019-07-11 (×2): qty 1

## 2019-07-11 MED ORDER — LACTATED RINGERS IV SOLN: INTRAVENOUS | Status: DC

## 2019-07-11 MED ORDER — ONDANSETRON HCL 4 MG/2ML IJ SOLN
INTRAMUSCULAR | Status: DC | PRN
  Administered 2019-07-11: 4 mg via INTRAVENOUS

## 2019-07-11 MED ORDER — PROPOFOL 10 MG/ML IV BOLUS
INTRAVENOUS | Status: AC
  Filled 2019-07-11: qty 20

## 2019-07-11 MED ORDER — SUCCINYLCHOLINE CHLORIDE 200 MG/10ML IV SOSY
PREFILLED_SYRINGE | INTRAVENOUS | Status: AC
  Filled 2019-07-11: qty 10

## 2019-07-11 MED ORDER — FENTANYL CITRATE (PF) 100 MCG/2ML IJ SOLN
25.0000 ug | INTRAMUSCULAR | Status: DC | PRN
  Administered 2019-07-11: 09:00:00 50 ug via INTRAVENOUS

## 2019-07-11 MED ORDER — FENTANYL CITRATE (PF) 100 MCG/2ML IJ SOLN
INTRAMUSCULAR | Status: AC
  Filled 2019-07-11: qty 2

## 2019-07-11 MED ORDER — NEOMYCIN-POLYMYXIN B GU 40-200000 IR SOLN
Status: DC | PRN
  Administered 2019-07-11: 2 mL

## 2019-07-11 MED ORDER — NEOMYCIN-POLYMYXIN B GU 40-200000 IR SOLN
Status: AC
  Filled 2019-07-11: qty 20

## 2019-07-11 MED ORDER — OXYCODONE HCL 5 MG PO TABS
ORAL_TABLET | ORAL | Status: AC
  Administered 2019-07-11: 5 mg
  Filled 2019-07-11: qty 1

## 2019-07-11 MED ORDER — ACETAMINOPHEN 10 MG/ML IV SOLN
INTRAVENOUS | Status: DC | PRN
  Administered 2019-07-11: 1000 mg via INTRAVENOUS

## 2019-07-11 MED ORDER — EPHEDRINE 5 MG/ML INJ
INTRAVENOUS | Status: AC
  Filled 2019-07-11: qty 10

## 2019-07-11 MED ORDER — ONDANSETRON HCL 4 MG/2ML IJ SOLN
4.0000 mg | Freq: Four times a day (QID) | INTRAMUSCULAR | Status: DC | PRN
  Administered 2019-07-11: 09:00:00 4 mg via INTRAVENOUS

## 2019-07-11 MED ORDER — SODIUM CHLORIDE 0.9 % IV SOLN: INTRAVENOUS | Status: DC

## 2019-07-11 MED ORDER — METOCLOPRAMIDE HCL 5 MG/ML IJ SOLN: 5.0000 mg | Freq: Three times a day (TID) | INTRAMUSCULAR | Status: DC | PRN

## 2019-07-11 MED ORDER — LIDOCAINE HCL (PF) 2 % IJ SOLN
INTRAMUSCULAR | Status: AC
  Filled 2019-07-11: qty 5

## 2019-07-11 MED ORDER — LIDOCAINE HCL (CARDIAC) PF 100 MG/5ML IV SOSY
PREFILLED_SYRINGE | INTRAVENOUS | Status: DC | PRN
  Administered 2019-07-11: 100 mg via INTRAVENOUS

## 2019-07-11 MED ORDER — DILTIAZEM HCL-DEXTROSE 125-5 MG/125ML-% IV SOLN (PREMIX)
5.0000 mg/h | INTRAVENOUS | Status: DC
  Filled 2019-07-11: qty 125

## 2019-07-11 MED ORDER — ONDANSETRON HCL 4 MG PO TABS: 4.0000 mg | ORAL_TABLET | Freq: Four times a day (QID) | ORAL | Status: DC | PRN

## 2019-07-11 MED ORDER — CEFAZOLIN SODIUM-DEXTROSE 2-4 GM/100ML-% IV SOLN
2.0000 g | INTRAVENOUS | Status: AC
  Administered 2019-07-11: 2 g via INTRAVENOUS

## 2019-07-11 MED ORDER — DEXAMETHASONE SODIUM PHOSPHATE 10 MG/ML IJ SOLN
INTRAMUSCULAR | Status: DC | PRN
  Administered 2019-07-11: 10 mg via INTRAVENOUS

## 2019-07-11 MED ORDER — ROCURONIUM BROMIDE 10 MG/ML (PF) SYRINGE
PREFILLED_SYRINGE | INTRAVENOUS | Status: AC
  Filled 2019-07-11: qty 10

## 2019-07-11 MED ORDER — OXYCODONE HCL 5 MG/5ML PO SOLN: 5.0000 mg | Freq: Once | ORAL | Status: AC | PRN

## 2019-07-11 MED ORDER — DILTIAZEM LOAD VIA INFUSION
INTRAVENOUS | Status: DC | PRN
  Administered 2019-07-11 (×2): 10 mg via INTRAVENOUS

## 2019-07-11 SURGICAL SUPPLY — 41 items
BIT DRILL 2 FAST STEP (BIT) ×3 IMPLANT
BIT DRILL 2.5X4 QC (BIT) ×3 IMPLANT
BNDG ELASTIC 4X5.8 VLCR STR LF (GAUZE/BANDAGES/DRESSINGS) ×3 IMPLANT
CANISTER SUCT 1200ML W/VALVE (MISCELLANEOUS) ×3 IMPLANT
CHLORAPREP W/TINT 26 (MISCELLANEOUS) ×3 IMPLANT
COVER WAND RF STERILE (DRAPES) ×3 IMPLANT
CUFF TOURN SGL QUICK 18X4 (TOURNIQUET CUFF) ×3 IMPLANT
DRAPE FLUOR MINI C-ARM 54X84 (DRAPES) ×3 IMPLANT
DRIVER PEG 2.0 FAST (BIT) IMPLANT
ELECT REM PT RETURN 9FT ADLT (ELECTROSURGICAL) ×3
ELECTRODE REM PT RTRN 9FT ADLT (ELECTROSURGICAL) ×1 IMPLANT
GAUZE SPONGE 4X4 12PLY STRL (GAUZE/BANDAGES/DRESSINGS) ×3 IMPLANT
GAUZE XEROFORM 1X8 LF (GAUZE/BANDAGES/DRESSINGS) ×6 IMPLANT
GLOVE SURG SYN 9.0  PF PI (GLOVE) ×2
GLOVE SURG SYN 9.0 PF PI (GLOVE) ×1 IMPLANT
GOWN SRG 2XL LVL 4 RGLN SLV (GOWNS) ×1 IMPLANT
GOWN STRL NON-REIN 2XL LVL4 (GOWNS) ×2
GOWN STRL REUS W/ TWL LRG LVL3 (GOWN DISPOSABLE) ×1 IMPLANT
GOWN STRL REUS W/TWL LRG LVL3 (GOWN DISPOSABLE) ×2
K-WIRE 1.6 (WIRE) ×2
K-WIRE FX5X1.6XNS BN SS (WIRE) ×1
KIT TURNOVER KIT A (KITS) ×3 IMPLANT
KWIRE FX5X1.6XNS BN SS (WIRE) ×1 IMPLANT
NEEDLE FILTER BLUNT 18X 1/2SAF (NEEDLE) ×2
NEEDLE FILTER BLUNT 18X1 1/2 (NEEDLE) ×1 IMPLANT
NS IRRIG 500ML POUR BTL (IV SOLUTION) ×3 IMPLANT
PACK EXTREMITY (MISCELLANEOUS) ×3 IMPLANT
PAD CAST CTTN 4X4 STRL (SOFTGOODS) ×2 IMPLANT
PADDING CAST COTTON 4X4 STRL (SOFTGOODS) ×4
PEG SUBCHONDRAL SMOOTH 2.0X18 (Peg) ×3 IMPLANT
PEG SUBCHONDRAL SMOOTH 2.0X20 (Peg) ×3 IMPLANT
PLATE SHORT 57 NRRW LT (Plate) ×3 IMPLANT
SCALPEL PROTECTED #15 DISP (BLADE) ×6 IMPLANT
SCREW CORT 3.5X10 LNG (Screw) ×6 IMPLANT
SCREW CORT 3.5X14 LNG (Screw) ×3 IMPLANT
SPLINT CAST 1 STEP 3X12 (MISCELLANEOUS) ×3 IMPLANT
SUT ETHILON 4-0 (SUTURE) ×2
SUT ETHILON 4-0 FS2 18XMFL BLK (SUTURE) ×1
SUT VICRYL 3-0 27IN (SUTURE) ×3 IMPLANT
SUTURE ETHLN 4-0 FS2 18XMF BLK (SUTURE) ×1 IMPLANT
SYR 3ML LL SCALE MARK (SYRINGE) ×3 IMPLANT

## 2019-07-11 NOTE — Discharge Instructions (Addendum)
Work on finger motion is much as you can. Keep arm elevated to minimize swelling. If fingers get very swollen it is okay to loosen the Ace wrap but leave splint and remainder of dressing in place. Keep dressing clean and dry.  Pain medicine as directed.   AMBULATORY SURGERY  DISCHARGE INSTRUCTIONS   1) The drugs that you were given will stay in your system until tomorrow so for the next 24 hours you should not:  A) Drive an automobile B) Make any legal decisions C) Drink any alcoholic beverage   2) You may resume regular meals tomorrow.  Today it is better to start with liquids and gradually work up to solid foods.  You may eat anything you prefer, but it is better to start with liquids, then soup and crackers, and gradually work up to solid foods.   3) Please notify your doctor immediately if you have any unusual bleeding, trouble breathing, redness and pain at the surgery site, drainage, fever, or pain not relieved by medication.    4) Additional Instructions:        Please contact your physician with any problems or Same Day Surgery at [phone], Monday through Friday 6 am to 4 pm, or [place] at [facility] number at [phone].

## 2019-07-11 NOTE — Anesthesia Procedure Notes (Addendum)
Procedure Name: LMA Insertion Date/Time: 07/11/2019 7:41 AM Performed by: Andria Frames, MD Pre-anesthesia Checklist: Patient identified, Emergency Drugs available, Suction available, Patient being monitored and Timeout performed Patient Re-evaluated:Patient Re-evaluated prior to induction Oxygen Delivery Method: Circle system utilized Preoxygenation: Pre-oxygenation with 100% oxygen Induction Type: IV induction LMA: LMA inserted LMA Size: 3.5 Number of attempts: 1 Placement Confirmation: positive ETCO2,  CO2 detector and breath sounds checked- equal and bilateral Tube secured with: Tape Dental Injury: Teeth and Oropharynx as per pre-operative assessment

## 2019-07-11 NOTE — Progress Notes (Signed)
Left hand warm to touch and pink in color.  Patient able to move fingers, has normal sensation.

## 2019-07-11 NOTE — Progress Notes (Signed)
Dr. Amie Critchley wants patient to take her diltiazem while here.  Pain meds given .  Patient real sleepy with iv fentanyl.

## 2019-07-11 NOTE — Anesthesia Preprocedure Evaluation (Signed)
Anesthesia Evaluation  Patient identified by MRN, date of birth, ID band Patient awake    Reviewed: Allergy & Precautions, H&P , NPO status , Patient's Chart, lab work & pertinent test results  History of Anesthesia Complications Negative for: history of anesthetic complications  Airway Mallampati: III  TM Distance: <3 FB Neck ROM: limited    Dental  (+) Poor Dentition, Missing, Upper Dentures, Lower Dentures   Pulmonary neg pulmonary ROS, neg shortness of breath, former smoker,           Cardiovascular Exercise Tolerance: Good hypertension, (-) angina+ Peripheral Vascular Disease  (-) Past MI and (-) DOE      Neuro/Psych PSYCHIATRIC DISORDERS negative neurological ROS  negative psych ROS   GI/Hepatic negative GI ROS, Neg liver ROS, neg GERD  ,  Endo/Other  diabetes, Type 2  Renal/GU      Musculoskeletal   Abdominal   Peds  Hematology negative hematology ROS (+)   Anesthesia Other Findings Past Medical History: No date: Atrial fibrillation ([place]) 08/2013: Breast cancer ([place])     Comment:  ER positive adenocarcinoma of Left Breast. with rad tx No date: DVT (deep venous thrombosis) ([facility]) No date: Hypercholesterolemia No date: Hypertension No date: Personal history of chemotherapy     Comment:  1 round No date: Personal history of radiation therapy No date: Skin cancer     Comment:  squamous cell No date: Squamous cell carcinoma of skin     Comment:  status post exicision  Past Surgical History: 09/04/2013: BREAST BIOPSY; Left     Comment:  negative stereo bx 08/25/2013: BREAST BIOPSY; Left     Comment:  postive [place] core bx 08/2017: BREAST LUMPECTOMY; Left 2015: BREAST LUMPECTOMY W/ NEEDLE LOCALIZATION; Left 08/05/2018: ESOPHAGOGASTRODUODENOSCOPY (EGD) WITH PROPOFOL; N/A     Comment:  Procedure: ESOPHAGOGASTRODUODENOSCOPY (EGD) WITH               PROPOFOL;  Surgeon: Lucilla Lame, MD;  Location: [facility]      ENDOSCOPY;  Service: Endoscopy;  Laterality: N/A; No date: GANGLION CYST EXCISION  BMI    Body Mass Index: 34.95 kg/m      Reproductive/Obstetrics negative OB ROS                             Anesthesia Physical Anesthesia Plan  ASA: III  Anesthesia Plan: General LMA   Post-op Pain Management:    Induction: Intravenous  PONV Risk Score and Plan: Dexamethasone, Ondansetron, Midazolam and Treatment may vary due to age or medical condition  Airway Management Planned: LMA  Additional Equipment:   Intra-op Plan:   Post-operative Plan: Extubation in OR  Informed Consent: I have reviewed the patients History and Physical, chart, labs and discussed the procedure including the risks, benefits and alternatives for the proposed anesthesia with the patient or authorized representative who has indicated his/her understanding and acceptance.     Dental Advisory Given  Plan Discussed with: Anesthesiologist, CRNA and Surgeon  Anesthesia Plan Comments: (Patient consented for risks of anesthesia including but not limited to:  - adverse reactions to medications - damage to eyes, teeth, lips or other oral mucosa - nerve damage due to positioning  - sore throat or hoarseness - Damage to heart, brain, nerves, lungs or loss of life  Patient voiced understanding.)        Anesthesia Quick Evaluation

## 2019-07-11 NOTE — Progress Notes (Signed)
Dr. Amie Critchley ok with giving second pain pill, oxy 5 mg .

## 2019-07-11 NOTE — Transfer of Care (Signed)
Immediate Anesthesia Transfer of Care Note  Patient: Stacy Reese  Procedure(s) Performed: OPEN REDUCTION INTERNAL FIXATION (ORIF) DISTAL RADIAL FRACTURE (Left Wrist)  Patient Location: PACU  Anesthesia Type:General  Level of Consciousness: awake, alert  and oriented  Airway & Oxygen Therapy: Patient Spontanous Breathing and Patient connected to face mask oxygen  Post-op Assessment: Report given to RN and Post -op Vital signs reviewed and stable  Post vital signs: Reviewed and stable  Last Vitals:  Vitals Value Taken Time  BP 138/97 07/11/19 0836  Temp 36.4 C 07/11/19 0836  Pulse 111 07/11/19 0839  Resp 15 07/11/19 0839  SpO2 96 % 07/11/19 0839  Vitals shown include unvalidated device data.  Last Pain:  Vitals:   07/11/19 0836  TempSrc:   PainSc: Asleep         Complications: No apparent anesthesia complications

## 2019-07-11 NOTE — Op Note (Signed)
07/11/2019  8:29 AM  PATIENT:  Stacy Reese  73 y.o. female  PRE-OPERATIVE DIAGNOSIS:  Other closed intra-articular fracture of distal end of left radius  POST-OPERATIVE DIAGNOSIS:  Other closed intra-articular fracture of distal end of left radius  PROCEDURE:  Procedure(s): OPEN REDUCTION INTERNAL FIXATION (ORIF) DISTAL RADIAL FRACTURE (Left)  SURGEON: Laurene Footman, MD  ASSISTANTS: None  ANESTHESIA:   general  EBL:  Total I/O In: 650 [I.V.:450; IV Piggyback:200] Out: -   BLOOD ADMINISTERED:none  DRAINS: none   LOCAL MEDICATIONS USED:  NONE  SPECIMEN:  No Specimen  DISPOSITION OF SPECIMEN:  N/A  COUNTS:  YES  TOURNIQUET:   Total Tourniquet Time Documented: Upper Arm (Left) - 24 minutes Total: Upper Arm (Left) - 24 minutes   IMPLANTS: [employer] innovations DVR short narrow plate with multiple screws and smooth pegs  DICTATION: .Dragon Dictation patient was brought to the operating room and after adequate anesthesia was obtained the left arm was prepped and draped in the usual sterile fashion.  After patient identification and timeout procedures were completed the tourniquet was raised.  Volar approach was made centered over the FCR tendon with the tendon sheath incised the tendon was retracted radially and the deep fascia incised.  The pronator was elevated off the distal and proximal fragments and the distal fragment mobilized.  It tended towards radial deviation as well as the shortening.  The shortening was taken care of with fingertrap traction off the end of the table and the radial styloid was partially exposed to get this mobilized so could the radial deviation could be corrected which was.  A volar plate was then applied with single screw in the slotted hole with plate adjustment to get the plate distal enough to correct the volar displacement of the distal fragment.  When this was set in the appropriate position the screw was tightened down followed by a more distal  screw in the shaft this gave essentially anatomic alignment.  The most proximal screw was also filled with a 10 mm screw.  2 of the proximal row smooth pegs were placed in the distal fragment to help maintain the alignment on the AP plane and prevent any slippage of the fracture under the plate.  The remainder of the fast guide insertion devices were removed off the plate and traction removed.  Under fluoroscopic views the fracture was stable.  The intra-articular displacement was entirely correct and there is essentially anatomic alignment in all planes.  The wound was irrigated and tourniquet let down.  The wound was then closed with 3-0 Vicryl subcutaneously 4-0 nylon for the skin with Xeroform 4 x 4's web roll volar splint and Ace wrap  PLAN OF CARE: Discharge to home after PACU  PATIENT DISPOSITION:  PACU - hemodynamically stable.

## 2019-07-11 NOTE — Progress Notes (Signed)
Spoke with patient friend Gaylyn Rong about legal issues regarding needing a person to supervise the patient for 24 hours;  She is in verbal agreement to have patient supervision at her home.  Betsey states "between me and Kennyth Lose", another neighbor, Ms. Sykora will be supervised for 24 hours.  Anesthesia aware of this conversation.

## 2019-07-11 NOTE — H&P (Signed)
Chief Complaint  Patient presents with  . Left Wrist - Fracture  . Left Foot - Fracture   Stacy Reese is a 74 y.o. female who presents today for evaluation of a left distal radius fracture. Patient is right-hand dominant. On 07/04/2019, patient fell injuring her left wrist. She had a displaced, shortened intra-articular distal radial metaphyseal fracture. She denies any numbness or tingling. She is been taken ibuprofen. She performs typical ADLs such as driving and caring for herself at home. She is retired. She denies any elbow pain. Pain is moderate. She is in a sugar tong splint and sling. She is on Coumadin for A. fib and history of PE  Past Medical History: Past Medical History:  Diagnosis Date  . A-fib (CMS-HCC) 08/17/2013  . Colorectal polyps  . DM II (diabetes mellitus, type II), controlled (CMS-HCC) 08/17/2013  . History of DVT (deep vein thrombosis)  2 dvt's and pulm emboli also in 2015  . Hypercholesterolemia  . Hypertension  . Menopause  . Obesity  . Pulmonary embolism, bilateral (CMS-HCC)  . Tobacco abuse   Past Surgical History: Past Surgical History:  Procedure Laterality Date  . excision skin cancer  several squamous cell carcinoma removed in the past  . Ganglion cyst removal Left  Axilla, 20 years ago  . MASTECTOMY Left July, 2015  Partial mastectomy with sentinel lymph node biopsy   Past Family History: Family History  Problem Relation Age of Onset  . Transient ischemic attack Mother  . High blood pressure (Hypertension) Mother  . Hyperlipidemia (Elevated cholesterol) Mother  . Transient ischemic attack Father  . Diabetes Father  . High blood pressure (Hypertension) Father  . Colon cancer Father   Medications: Current Outpatient Medications Ordered in Epic  Medication Sig Dispense Refill  . aluminum-magnesium hydroxide-simethicone, DRAH, (MI-ACID) 200-200-20 mg/5 mL suspension Take by mouth  . aspirin 81 MG EC tablet Take 81 mg by mouth once daily.  Marland Kitchen  buPROPion (WELLBUTRIN SR) 150 MG SR tablet Take 1 tablet (150 mg total) by mouth 2 (two) times daily. 180 tablet 3  . busPIRone (BUSPAR) 10 MG tablet TAKE ONE TABLET BY MOUTH DAILY 90 tablet 0  . CARTIA XT 240 mg CD capsule Take 240 mg by mouth once daily  . citalopram (CELEXA) 20 MG tablet TAKE ONE TABLET BY MOUTH DAILY 90 tablet 0  . diltiazem (TIAZAC) 240 MG ER capsule TAKE ONE CAPSULE BY MOUTH DAILY 90 capsule 2  . letrozole (FEMARA) 2.5 mg tablet Take 1 tablet by mouth once daily.  Marland Kitchen LORazepam (ATIVAN) 1 MG tablet Take 1 mg by mouth every 4 (four) hours as needed for Anxiety (Rarely as needed).  . pantoprazole (PROTONIX) 40 MG DR tablet Take by mouth  . pravastatin (PRAVACHOL) 40 MG tablet TAKE ONE TABLET BY MOUTH EVERY EVENING 90 tablet 0  . triamterene-hydrochlorothiazide (MAXZIDE-25) 37.5-25 mg tablet TAKE ONE TABLET BY MOUTH DAILY 90 tablet 0  . warfarin (COUMADIN) 1 MG tablet Take 1 tablet (1 mg total) by mouth once daily 30 tablet 5  . warfarin (COUMADIN) 2 MG tablet TAKE ONE TABLET BY MOUTH DAILY 90 tablet 0  . warfarin (COUMADIN) 5 MG tablet TAKE AS DIRECTED - CALLED CLINIC 02/02/18 TO GET CLARIFICATION ON PRESCRIPTION DIRECTIONS 90 tablet 0   No current Epic-ordered facility-administered medications on file.   Allergies: Allergies  Allergen Reactions  . Metoprolol Succinate Other (See Comments)  Wheezing  . Other Other (See Comments)  Sodium pentothal Causes blood clots  Review of Systems:  A comprehensive 14 point ROS was performed, reviewed by me today, and the pertinent orthopaedic findings are documented in the HPI.  Exam: BP 118/76  Wt 96.3 kg (212 lb 3.2 oz)  BMI 35.31 kg/m  General:  Well developed, well nourished, no apparent distress, normal affect, normal gait with no antalgic component.   HEENT: Head normocephalic, atraumatic, PERRL.   Abdomen: Soft, non tender, non distended, Bowel sounds present.  Heart: Examination of the heart reveals regular,  rate, and rhythm. There is no murmur noted on ascultation. There is a normal apical pulse.  Lungs: Lungs are clear to auscultation. There is no wheeze, rhonchi, or crackles. There is normal expansion of bilateral chest walls.   Left wrist: Examination of the left wrist shows well fitted sugar tong splint. Mild swelling throughout the digits. Normal sensation throughout the digits with normal cap refill. No skin breakdown noted.  AP lateral oblique views of the left wrist are ordered interpreted by me in the office today. Impression: Patient has intra-articular oblique distal radial metaphyseal fracture with shortening of the volar aspect of the distal radial metaphysis. There is significant displacement and offset. Minimally displaced ulnar styloid fracture. Moderate CMC osteoarthritis.  Impression: Other closed intra-articular fracture of distal end of left radius, initial encounter [S52.572A] Other closed intra-articular fracture of distal end of left radius, initial encounter (primary encounter diagnosis)  Plan:  1. Risks, benefits, complications of a left distal radius ORIF have been discussed with the patient. Patient has agreed and consented to the procedure with Dr. Hessie Knows on 07/11/2019. She will hold Coumadin starting today through surgery date.  This note was generated in part with voice recognition software and I apologize for any typographical errors that were not detected and corrected.  Feliberto Gottron MPA-C    Electronically signed by Feliberto Gottron, [place] at 07/10/2019 8:21 AM EDT   Reviewed paper H+P, will be scanned into chart. No changes noted.

## 2019-07-11 NOTE — Anesthesia Postprocedure Evaluation (Signed)
Anesthesia Post Note  Patient: AMIREE SEE  Procedure(s) Performed: OPEN REDUCTION INTERNAL FIXATION (ORIF) DISTAL RADIAL FRACTURE (Left Wrist)  Patient location during evaluation: PACU Anesthesia Type: General Level of consciousness: awake and alert Pain management: pain level controlled Vital Signs Assessment: post-procedure vital signs reviewed and stable Respiratory status: spontaneous breathing, nonlabored ventilation, respiratory function stable and patient connected to nasal cannula oxygen Cardiovascular status: blood pressure returned to baseline and stable Postop Assessment: no apparent nausea or vomiting Anesthetic complications: no     Last Vitals:  Vitals:   07/11/19 0954 07/11/19 1018  BP: 128/86 128/90  Pulse: 99 (!) 101  Resp:  16  Temp:    SpO2: 96% 95%    Last Pain:  Vitals:   07/11/19 1012  TempSrc:   PainSc: 6                  Precious Haws Surya Schroeter

## 2019-09-28 ENCOUNTER — Other Ambulatory Visit: Payer: Self-pay | Admitting: Oncology

## 2019-10-05 ENCOUNTER — Ambulatory Visit
Admission: RE | Admit: 2019-10-05 | Discharge: 2019-10-05 | Disposition: A | Discharge status: Home / Self Care | Payer: Medicare HMO | Source: Ambulatory Visit | Attending: Oncology | Admitting: Oncology

## 2019-10-05 DIAGNOSIS — Z9221 Personal history of antineoplastic chemotherapy: Secondary | ICD-10-CM | POA: Diagnosis not present

## 2019-10-05 DIAGNOSIS — C50512 Malignant neoplasm of lower-outer quadrant of left female breast: Secondary | ICD-10-CM

## 2019-10-05 DIAGNOSIS — Z780 Asymptomatic menopausal state: Secondary | ICD-10-CM | POA: Diagnosis not present

## 2019-10-05 DIAGNOSIS — Z13820 Encounter for screening for osteoporosis: Secondary | ICD-10-CM | POA: Diagnosis not present

## 2019-10-05 DIAGNOSIS — Z1231 Encounter for screening mammogram for malignant neoplasm of breast: Secondary | ICD-10-CM | POA: Diagnosis not present

## 2019-10-05 DIAGNOSIS — Z853 Personal history of malignant neoplasm of breast: Secondary | ICD-10-CM | POA: Diagnosis not present

## 2019-10-05 DIAGNOSIS — Z923 Personal history of irradiation: Secondary | ICD-10-CM | POA: Insufficient documentation

## 2019-10-05 IMAGING — MG DIGITAL SCREENING BILAT W/ TOMO W/ CAD
6 of 10 series · 6 of 30 positions shown · non-contrast
Comparison: Previous exam(s).

CLINICAL DATA: Screening.

EXAM:
DIGITAL SCREENING BILATERAL MAMMOGRAM WITH TOMO AND CAD

[L CC synth-2D (1 of 2)]
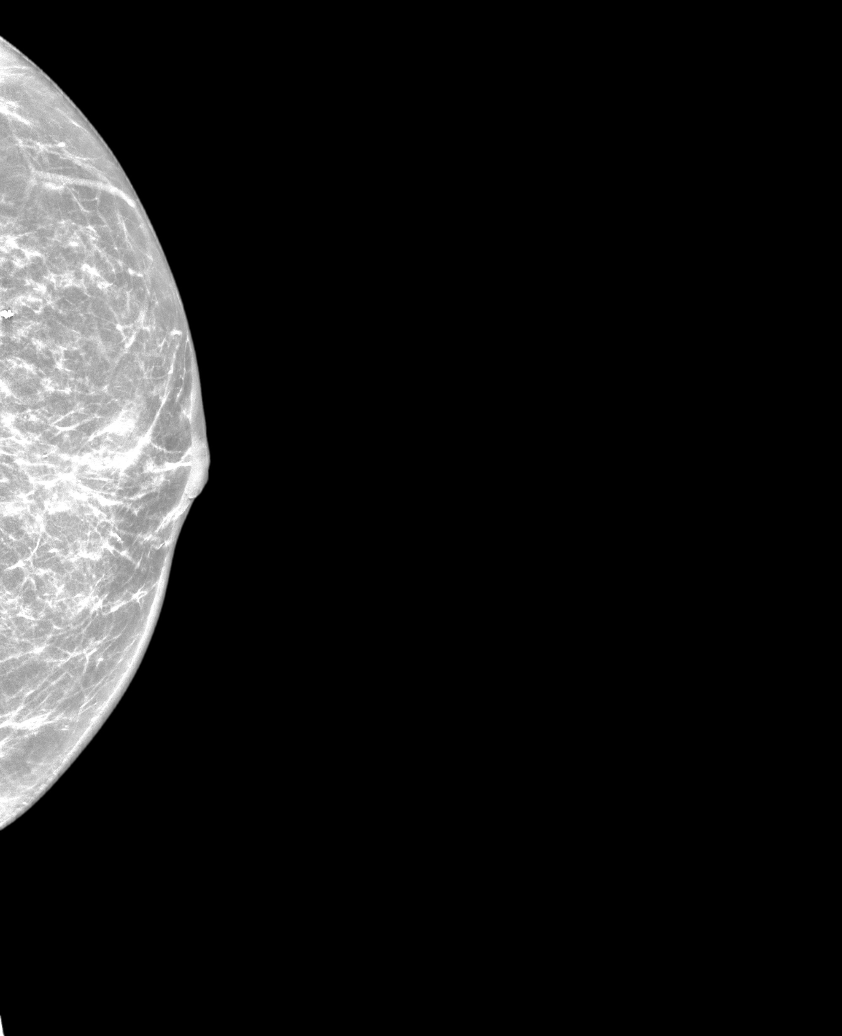

[L CC synth-2D (2 of 2)]
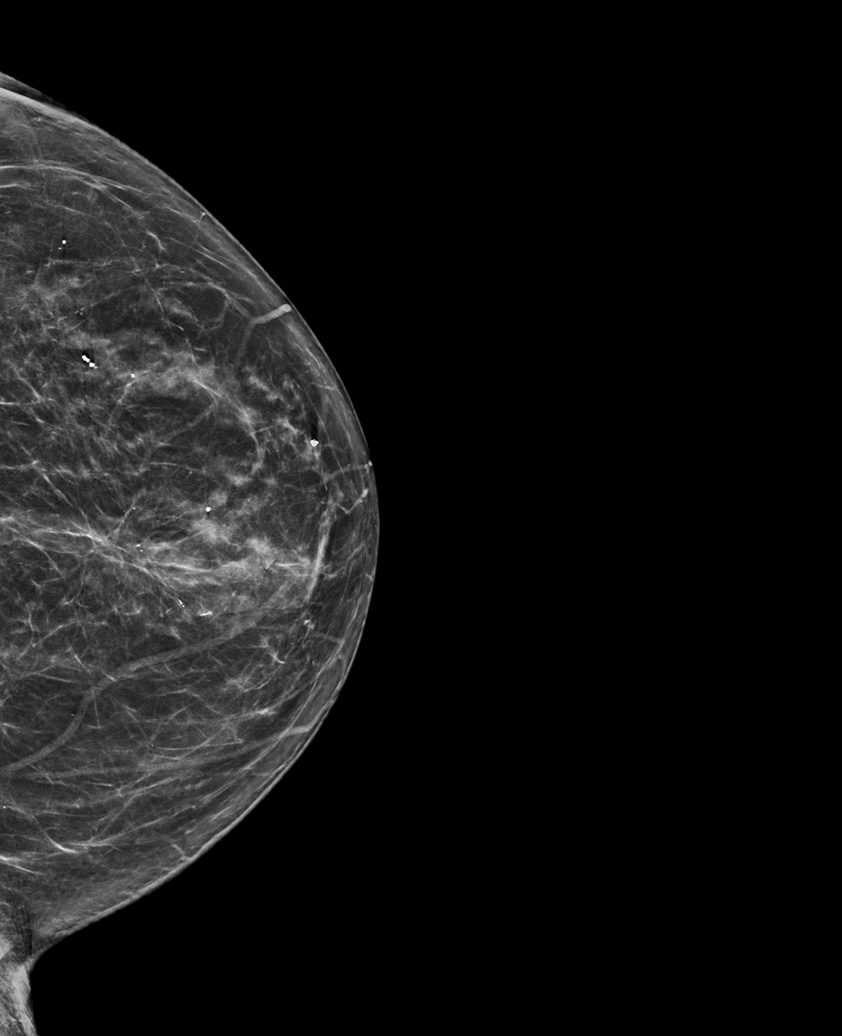

[R CC synth-2D]
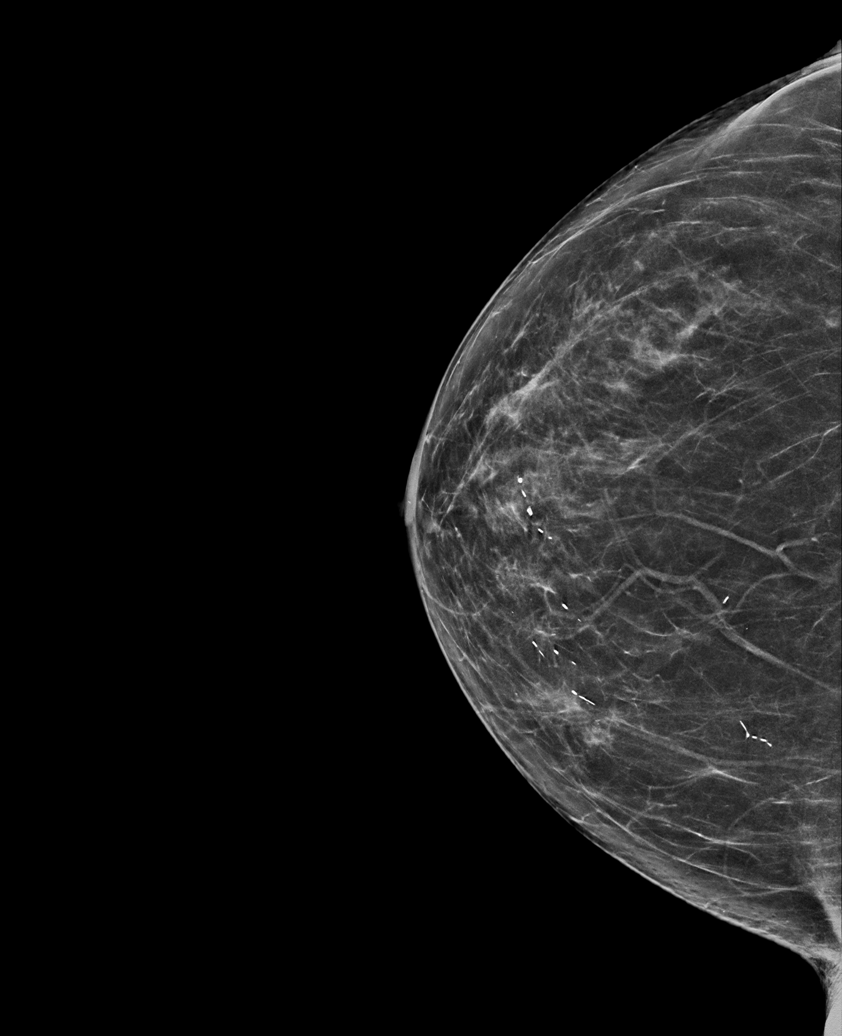

[R MLO synth-2D]
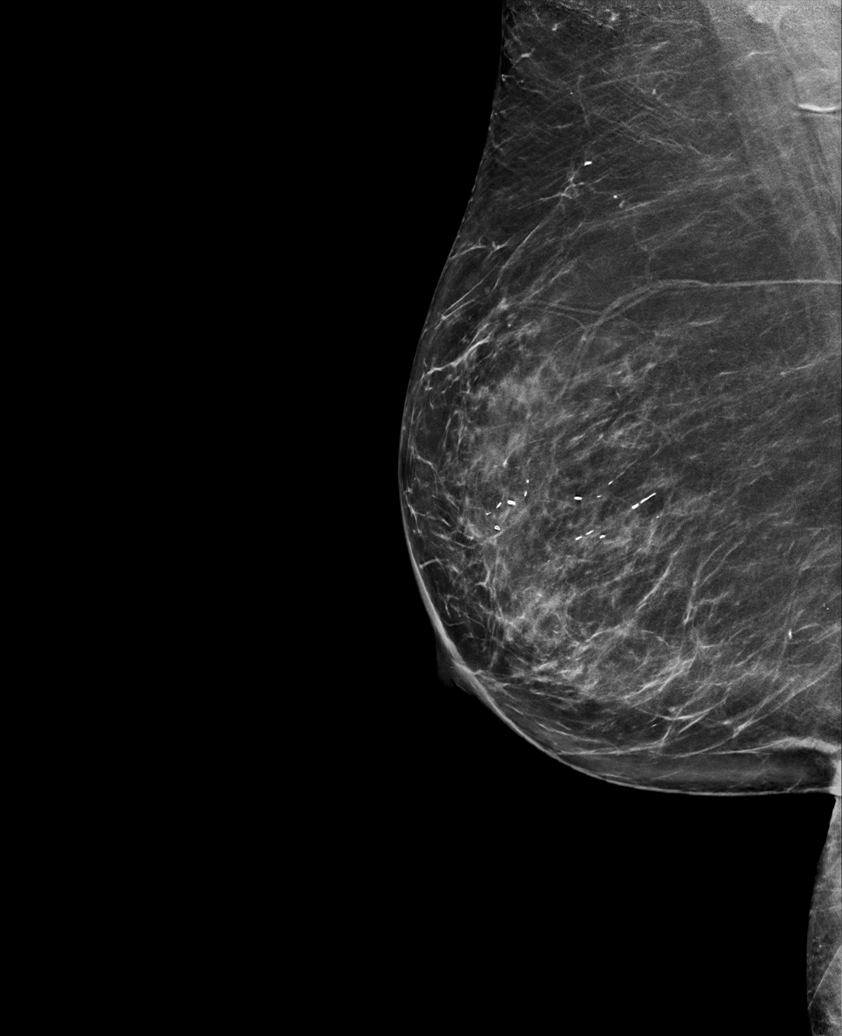

[L MLO synth-2D]
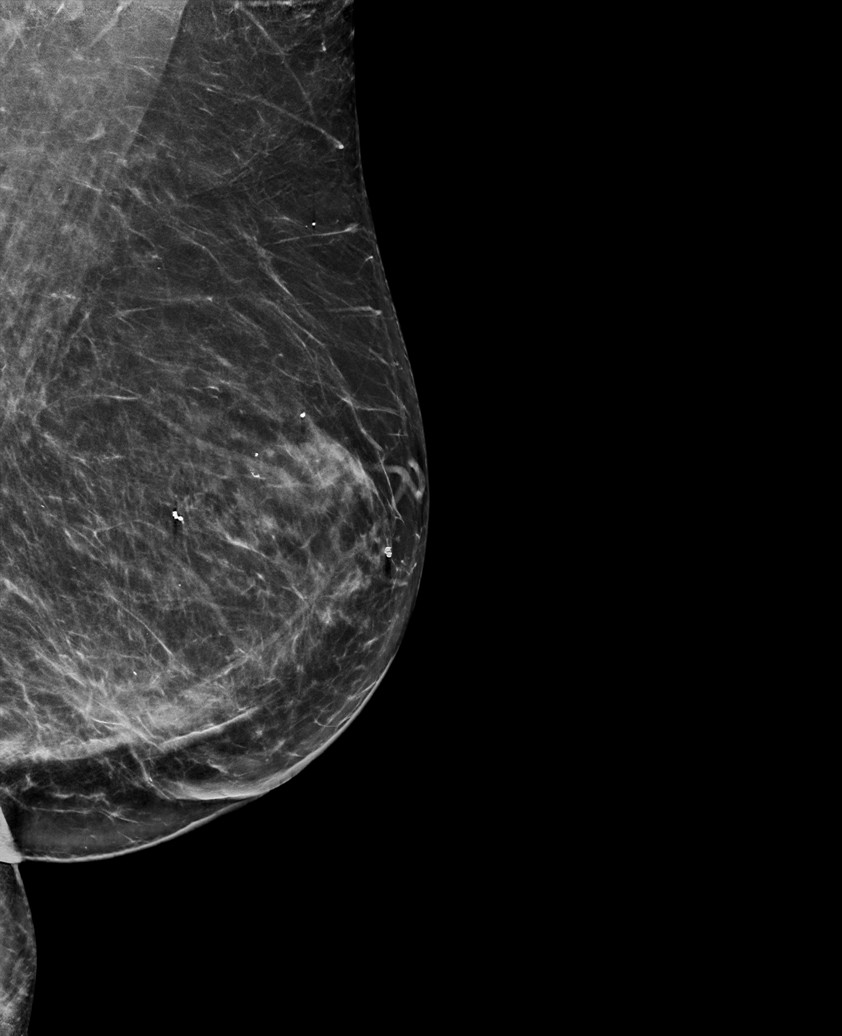

[L CC tomo · tomo slice 37/72.0]
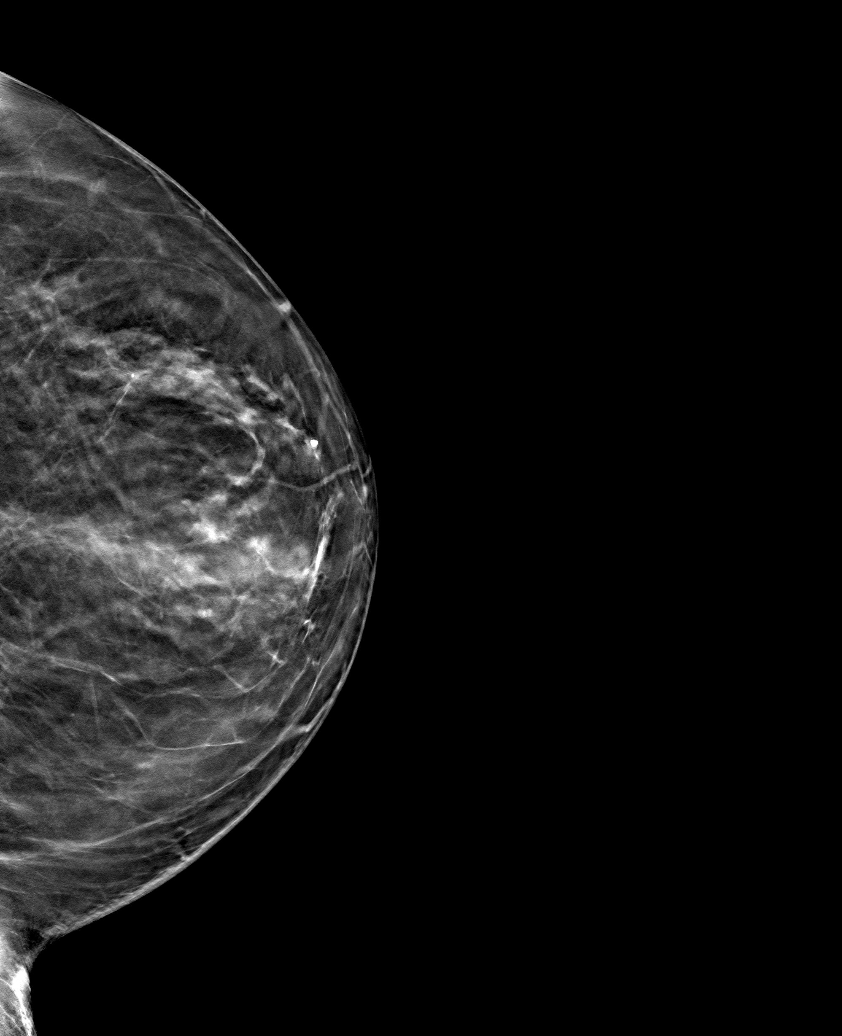

[6 of 30 positions shown; findings below may reference images not displayed]

ACR Breast Density Category c: The breast tissue is heterogeneously
dense, which may obscure small masses.
FINDINGS: There are no findings suspicious for malignancy. Images were
processed with CAD.
IMPRESSION: No mammographic evidence of malignancy. A result letter of this
screening mammogram will be mailed directly to the patient.

RECOMMENDATION:
Screening mammogram in one year. (Code:[5V])

BI-RADS CATEGORY  1: Negative.

## 2019-10-07 NOTE — Progress Notes (Signed)
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  Heterozygote factor V 5 Leiden.   INTERVAL HISTORY: Patient returns to clinic today for routine 1-month evaluation. She had a fall recently fracturing her left wrist, but otherwise has felt well.  She continues to tolerate letrozole and Coumadin without significant side effects.  She has no neurologic complaints. She denies any recent fevers or illnesses. She denies any chest pain, cough, hemoptysis, or shortness of breath. She denies any nausea, vomiting, constipation or diarrhea. She has no melena or hematochezia. She has no urinary complaints.  Patient offers no specific complaints today.  REVIEW OF SYSTEMS:   Review of Systems  Constitutional: Negative.  Negative for fever, malaise/fatigue and weight loss.  Respiratory: Negative.  Negative for cough and shortness of breath.   Cardiovascular: Negative.  Negative for chest pain and leg swelling.  Gastrointestinal: Negative.  Negative for abdominal pain, blood in stool and melena.  Genitourinary: Negative.  Negative for hematuria.  Musculoskeletal: Negative.  Negative for back pain.  Skin: Negative.  Negative for rash.  Neurological: Negative.  Negative for focal weakness, weakness and headaches.  Endo/Heme/Allergies: Does not bruise/bleed easily.  Psychiatric/Behavioral: Negative.  The patient is not nervous/anxious.     As per HPI. Otherwise, a complete review of systems is negative.  PAST MEDICAL HISTORY: Past Medical History:  Diagnosis Date  . Actinic keratosis 06/29/2006   R pretibial mid, and med (bx proven)  . Atrial fibrillation ([place])   . Breast cancer ([place]) 08/2013   ER  positive adenocarcinoma of Left Breast. with rad tx  . DVT (deep venous thrombosis) ([place])   . Hypercholesterolemia   . Hypertension   . 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 Personal history of chemotherapy    1 round  . Personal history of radiation therapy   . Skin cancer    squamous cell  . Squamous cell carcinoma of skin 03/24/2006   L sup pretibial   . Squamous cell carcinoma of skin 03/24/2006   R pretibial inf   . Squamous cell carcinoma of skin 05/25/2006   L lat mid lower leg   . Squamous cell carcinoma of skin 06/29/2006   R pretibial lat   . Squamous cell carcinoma of skin 10/17/2012   R prox pretibial  ED&C  . Squamous cell carcinoma of skin 12/19/2012   R prox sup pretibial   . Squamous cell carcinoma of skin 02/03/2016   L med mid pretibial   . Squamous cell carcinoma of skin 08/20/2016   L pretibial sup   . Squamous cell carcinoma of skin 08/20/2016   L pretibial inf   . Squamous cell carcinoma of skin 10/10/2018   L prox med pretibial   . Squamous cell carcinoma of skin 06/29/2019   L calf     PAST SURGICAL  HISTORY: Past Surgical History:  Procedure Laterality Date  . BREAST BIOPSY Left 09/04/2013   negative stereo bx  . BREAST BIOPSY Left 08/25/2013   postive [place] core bx  . BREAST LUMPECTOMY Left 08/2017  . BREAST LUMPECTOMY W/ NEEDLE LOCALIZATION Left 2015  . ESOPHAGOGASTRODUODENOSCOPY (EGD) WITH PROPOFOL N/A 08/05/2018   Procedure: ESOPHAGOGASTRODUODENOSCOPY (EGD) WITH PROPOFOL;  Surgeon: Lucilla Lame, MD;  Location[facility]COPY;  Service: Endoscopy;  Laterality: N/A;  . GANGLION CYST EXCISION    . OPEN REDUCTION INTERNAL FIXATION (ORIF) DISTAL RADIAL FRACTURE Left 07/11/2019   Procedure: OPEN REDUCTION INTERNAL FIXATION (ORIF) DISTAL RADIAL FRACTURE;  Surgeon: Hessie Knows, MD;  Location: [facility] ORS;  Service: Orthopedics;  Laterality: Left;    FAMILY HISTORY Family History  Problem Relation Age of Onset  . Cancer Father        colon  . CAD Father   . Hypertension Father   . Hyperlipidemia Father   . CAD Mother   . Breast cancer Neg Hx        ADVANCED DIRECTIVES:    HEALTH MAINTENANCE: Social History   Tobacco Use  . Smoking status: Former Research scientist (life sciences)  . Smokeless tobacco: Never Used  Substance Use Topics  . Alcohol use: Yes    Comment: social  . Drug use: No     Colonoscopy:  PAP:  Bone density:  Lipid panel:  Allergies  Allergen Reactions  . Metoprolol Shortness Of Breath  . Other Other (See Comments)    Sodium pentothal  Causes blood clots    Current Outpatient Medications  Medication Sig Dispense Refill  . alum & mag hydroxide-simeth (MAALOX/MYLANTA) 200-200-20 MG/5ML suspension Take 30 mLs by mouth every 6 (six) hours as needed for indigestion or heartburn. 355 mL 0  . aspirin EC 81 MG tablet Take 81 mg by mouth daily.     . busPIRone (BUSPAR) 10 MG tablet Take 10 mg by mouth daily.     . citalopram (CELEXA) 20 MG tablet Take 20 mg by mouth daily.     Marland Kitchen diltiazem (TIAZAC) 240 MG 24 hr capsule Take 240 mg by mouth daily.     Marland Kitchen HYDROcodone-acetaminophen  (NORCO) 5-325 MG tablet Take 1 tablet by mouth every 6 (six) hours as needed for moderate pain. 20 tablet 0  . letrozole (FEMARA) 2.5 MG tablet TAKE ONE TABLET BY MOUTH DAILY 90 tablet 2  . LORazepam (ATIVAN) 1 MG tablet Take 1 mg by mouth daily as needed for anxiety.     . Multiple Vitamins-Minerals (MULTIVITAMIN WITH MINERALS) tablet Take 1 tablet by mouth daily. Alive    . pravastatin (PRAVACHOL) 40 MG tablet Take 40 mg by mouth daily.     Marland Kitchen triamterene-hydrochlorothiazide (MAXZIDE-25) 37.5-25 MG per tablet Take 1 tablet by mouth daily.     . vitamin B-12 (CYANOCOBALAMIN) 1000 MCG tablet Take 1,000 mcg by mouth daily.    Marland Kitchen warfarin (COUMADIN) 5 MG tablet Take 5 mg by mouth See  admin instructions. Takes along with a 2mg  to equal 7mg  daily    . alendronate (FOSAMAX) 70 MG tablet Take 1 tablet (70 mg total) by mouth once a week. Take with a full glass of water on an empty stomach. 12 tablet 3  . pantoprazole (PROTONIX) 40 MG tablet Take 1 tablet (40 mg total) by mouth daily. 30 tablet 11  . warfarin (COUMADIN) 2 MG tablet Take 2 mg by mouth See admin instructions. Take with 5 mg for a total of 7 mg     No current facility-administered medications for this visit.    OBJECTIVE: Vitals:   10/09/19 1437  BP: (!) 158/80  Pulse: 84  Temp: (!) 97.4 F (36.3 C)  SpO2: 97%     Body mass index is 34.61 kg/m.    ECOG FS:0 - Asymptomatic  General: Well-developed, well-nourished, no acute distress. Eyes: Pink conjunctiva, anicteric sclera. HEENT: Normocephalic, moist mucous membranes. Lungs: No audible wheezing or coughing. Heart: Regular rate and rhythm. Abdomen: Soft, nontender, no obvious distention. Musculoskeletal: No edema, cyanosis, or clubbing. Neuro: Alert, answering all questions appropriately. Cranial nerves grossly intact. Skin: No rashes or petechiae noted. Psych: Normal affect.   LAB RESULTS:  Lab Results  Component Value Date   NA 138 07/11/2019   K 3.4 (L) 07/11/2019    CL 103 07/11/2019   CO2 24 07/11/2019   GLUCOSE 147 (H) 07/11/2019   BUN 13 07/11/2019   CREATININE 0.78 07/11/2019   CALCIUM 9.2 07/11/2019   PROT 7.3 08/05/2018   ALBUMIN 3.9 08/05/2018   AST 55 (H) 08/05/2018   ALT 77 (H) 08/05/2018   ALKPHOS 84 08/05/2018   BILITOT 1.1 08/05/2018   GFRNONAA >60 07/11/2019   GFRAA >60 07/11/2019    Lab Results  Component Value Date   WBC 7.6 07/11/2019   NEUTROABS 11.3 (H) 08/05/2018   HGB 14.5 07/11/2019   HCT 41.9 07/11/2019   MCV 82.8 07/11/2019   PLT 236 07/11/2019     STUDIES: DG Bone Density  Result Date: 10/05/2019 EXAM: DUAL X-RAY ABSORPTIOMETRY (DXA) FOR BONE MINERAL DENSITY IMPRESSION: Your patient Stacy Reese completed a BMD test on 10/05/2019 using the [place] (software version: 14.10) manufactured by [employer]. The following summarizes the results of our evaluation. Technologist:VLM PATIENT BIOGRAPHICAL: Name: Stacy Reese, Stacy Reese Patient ID: [id number] Birth Date: 02/28/1946 Height: 63.5 in. Gender: Female Exam Date: 10/05/2019 Weight: 207.0 lbs. Indications: Advanced Age, Caucasian, Diabetic, Family Hist. (Parent hip fracture), Height Loss, High Risk Meds, History of Breast Cancer, Low Calcium Intake, Parent Hip Fracture, Postmenopausal, Previous Chemo and Radiation Fractures: Left wrist Treatments: Femara, letrozole DENSITOMETRY RESULTS: Site      Region     Measured Date Measured Age WHO Classification Young Adult T-score BMD         %Change vs. Previous Significant Change (*) AP Spine L1-L2 10/05/2019 71.9 Osteopenia -1.6 0.979 g/cm2 -3.1% - AP Spine L1-L2 10/04/2018 70.9 Osteopenia -1.4 1.010 g/cm2 -1.3% - AP Spine L1-L2 08/31/2017 69.8 Osteopenia -1.2 1.023 g/cm2 -2.4% - AP Spine L1-L2 07/14/2016 68.7 Normal -1.0 1.048 g/cm2 -5.8% Yes AP Spine L1-L2 03/14/2015 67.3 Normal -0.5 1.113 g/cm2 - - DualFemur Neck Left 10/05/2019 71.9 Osteopenia -2.4 0.707 g/cm2 -2.1% - DualFemur Neck Left 10/04/2018 70.9 Osteopenia  -2.3 0.722 g/cm2 -2.4% - DualFemur Neck Left 08/31/2017 69.8 Osteopenia -2.1 0.740 g/cm2 -0.3% - DualFemur Neck Left 07/14/2016 68.7 Osteopenia -2.1 0.742 g/cm2 -3.8% - DualFemur Neck Left 03/14/2015 67.3 Osteopenia -1.9 0.771 g/cm2 - -  DualFemur Total Mean 10/05/2019 71.9 Osteopenia -2.2 0.725 g/cm2 -3.8% - DualFemur Total Mean 10/04/2018 70.9 Osteopenia -2.0 0.754 g/cm2 -2.6% - DualFemur Total Mean 08/31/2017 69.8 Osteopenia -1.9 0.774 g/cm2 -2.8% - DualFemur Total Mean 07/14/2016 68.7 Osteopenia -1.7 0.796 g/cm2 -2.8% - DualFemur Total Mean 03/14/2015 67.3 Osteopenia -1.5 0.819 g/cm2 - - ASSESSMENT: The BMD measured at Femur Neck Left is 0.707 g/cm2 with a T-score of -2.4. This patient is considered osteopenic according to [facility]) criteria. Compared with prior study, there has been no significant change in the spine. Compared with prior study, there has been no significant change in the total hip. The scan quality is good. World Pharmacologist [facility]) criteria for post-menopausal, Caucasian Women: Normal:                   T-score at or above -1 SD Osteopenia/low bone mass: T-score between -1 and -2.5 SD Osteoporosis:             T-score at or below -2.5 SD RECOMMENDATIONS: 1. All patients should optimize calcium and vitamin D intake. 2. Consider FDA-approved medical therapies in postmenopausal women and men aged 68 years and older, based on the following: a. A hip or vertebral(clinical or morphometric) fracture b. T-score < -2.5 at the femoral neck or spine after appropriate evaluation to exclude secondary causes c. Low bone mass (T-score between -1.0 and -2.5 at the femoral neck or spine) and a 10-year probability of a hip fracture > 3% or a 10-year probability of a major osteoporosis-related fracture > 20% based on the [place]-adapted WHO algorithm 3. Clinician judgment and/or patient preferences may indicate treatment for people with 10-year fracture probabilities above or below these  levels FOLLOW-UP: People with diagnosed cases of osteoporosis or at high risk for fracture should have regular bone mineral density tests. For patients eligible for Medicare, routine testing is allowed once every 2 years. The testing frequency can be increased to one year for patients who have rapidly progressing disease, those who are receiving or discontinuing medical therapy to restore bone mass, or have additional risk factors. I have reviewed this report, and agree with the above findings. [facility] Dear Lloyd Huger, Your patient Stacy Reese completed a FRAX assessment on 10/05/2019 using the [place] (analysis version: 14.10) manufactured by [employer]. The following summarizes the results of our evaluation. PATIENT BIOGRAPHICAL: Name: Stacy Reese, Stacy Reese Patient ID: [id number] Birth Date: 11/10/1945 Height:    63.5 in. Gender:     Female    [age]       Weight:    207.0 lbs. Ethnicity:  White                            Exam Date: 10/05/2019 FRAX* RESULTS:  (version: 3.5) 10-year Probability of Fracture1 Major Osteoporotic Fracture2 Hip Fracture 22.0% 9.2% Population: [place] (Caucasian) Risk Factors: Family Hist. (Parent hip fracture) Based on Femur (Left) Neck BMD 1 -The 10-year probability of fracture may be lower than reported if the patient has received treatment. 2 -Major Osteoporotic Fracture: Clinical Spine, Forearm, Hip or Shoulder *FRAX is a Materials engineer of the [employer] for Metabolic Bone Disease, a [place] (WHO) [facility]. ASSESSMENT: The probability of a major osteoporotic fracture is 22.0% within the next ten years. The probability of a hip fracture is 9.2 % within the  next ten years. . Electronically Signed   By: Lowella Grip III M.D.   On: 10/05/2019 10:55   MM 3D SCREEN BREAST BILATERAL  Result Date: 10/06/2019 CLINICAL DATA:  Screening. EXAM: DIGITAL SCREENING BILATERAL MAMMOGRAM  WITH TOMO AND CAD COMPARISON:  Previous exam(s). ACR Breast Density Category c: The breast tissue is heterogeneously dense, which may obscure small masses. FINDINGS: There are no findings suspicious for malignancy. Images were processed with CAD. IMPRESSION: No mammographic evidence of malignancy. A result letter of this screening mammogram will be mailed directly to the patient. RECOMMENDATION: Screening mammogram in one year. (Code:SM-B-01Y) BI-RADS CATEGORY  1: Negative. Electronically Signed   By: Ammie Ferrier M.D.   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  Heterozygote factor V 5 Leiden.  PLAN:    1. 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 She received one cycle of Taxotere and Cytoxan, but secondary to significant toxicity this was discontinued.  Because of her increased Oncotype score, patient agreed to take a total of 7 years of letrozole and has been instructed to continue treatment until her next clinic visit.  Her most recent mammogram on October 05, 2019 was reported as BI-RADS 1.  Repeat in July 2022.  Return to clinic in 6 months for routine evaluation.   2. Heterozygote factor V 5 Leiden: Patient was diagnosed with PE and DVT at an outside hospital. She completed 6 months of treatment with Xarelto, but given her increased risk she elected to stay on anticoagulation lifelong.  She is now on Coumadin and INR is managed by her primary care physician.  Goal INR is 2.0-3.0.     3. Osteopenia: Patient's most recent bone mineral density on October 05, 2019 reported T score of -2.4 which is slightly worse than 1 year prior.  Given her recent fracture wrist, patient has agreed to initiate Fosamax once per week.  Continue calcium and vitamin D supplementation  as well as increased dietary intake.  Repeat bone density in July 2022.    Patient expressed understanding and was in agreement with this plan. She also understands that She can call clinic at any time with any questions, concerns, or complaints.   Breast cancer   Staging form: Breast, AJCC 7th Edition     Clinical stage from 09/22/2014: Stage IA (T1b, N0, M0) - Signed by Lloyd Huger, MD on 09/22/2014   Lloyd Huger, MD   10/10/2019 6:37 AM

## 2019-10-09 ENCOUNTER — Encounter: Payer: Self-pay | Admitting: Oncology

## 2019-10-09 ENCOUNTER — Inpatient Hospital Stay: Payer: Medicare HMO | Attending: Oncology | Admitting: Oncology

## 2019-10-09 ENCOUNTER — Other Ambulatory Visit: Payer: Self-pay

## 2019-10-09 VITALS — BP 158/80 | HR 84 | Temp 97.4°F | Wt 208.0 lb

## 2019-10-09 DIAGNOSIS — M8580 Other specified disorders of bone density and structure, unspecified site: Secondary | ICD-10-CM | POA: Diagnosis not present

## 2019-10-09 DIAGNOSIS — I10 Essential (primary) hypertension: Secondary | ICD-10-CM | POA: Diagnosis not present

## 2019-10-09 DIAGNOSIS — Z170 Estrogen receptor positive status [ER+]: Secondary | ICD-10-CM | POA: Diagnosis not present

## 2019-10-09 DIAGNOSIS — Z7901 Long term (current) use of anticoagulants: Secondary | ICD-10-CM | POA: Insufficient documentation

## 2019-10-09 DIAGNOSIS — Z79899 Other long term (current) drug therapy: Secondary | ICD-10-CM | POA: Insufficient documentation

## 2019-10-09 DIAGNOSIS — Z87891 Personal history of nicotine dependence: Secondary | ICD-10-CM | POA: Diagnosis not present

## 2019-10-09 DIAGNOSIS — Z86718 Personal history of other venous thrombosis and embolism: Secondary | ICD-10-CM | POA: Diagnosis not present

## 2019-10-09 DIAGNOSIS — I4891 Unspecified atrial fibrillation: Secondary | ICD-10-CM | POA: Diagnosis not present

## 2019-10-09 DIAGNOSIS — C50512 Malignant neoplasm of lower-outer quadrant of left female breast: Secondary | ICD-10-CM | POA: Insufficient documentation

## 2019-10-09 DIAGNOSIS — D6851 Activated protein C resistance: Secondary | ICD-10-CM | POA: Insufficient documentation

## 2019-10-09 MED ORDER — ALENDRONATE SODIUM 70 MG PO TABS: 70.0000 mg | ORAL_TABLET | ORAL | 3 refills | Status: DC

## 2019-10-10 ENCOUNTER — Other Ambulatory Visit: Payer: Self-pay | Admitting: Internal Medicine

## 2019-10-10 DIAGNOSIS — R270 Ataxia, unspecified: Secondary | ICD-10-CM

## 2019-10-10 DIAGNOSIS — R413 Other amnesia: Secondary | ICD-10-CM

## 2019-10-11 ENCOUNTER — Ambulatory Visit: Payer: Medicare HMO | Admitting: Dermatology

## 2019-10-25 ENCOUNTER — Ambulatory Visit
Admission: RE | Admit: 2019-10-25 | Discharge: 2019-10-25 | Disposition: A | Discharge status: Home / Self Care | Payer: Medicare HMO | Source: Ambulatory Visit | Attending: Internal Medicine | Admitting: Internal Medicine

## 2019-10-25 ENCOUNTER — Other Ambulatory Visit: Payer: Self-pay

## 2019-10-25 DIAGNOSIS — R270 Ataxia, unspecified: Secondary | ICD-10-CM | POA: Diagnosis present

## 2019-10-25 DIAGNOSIS — R413 Other amnesia: Secondary | ICD-10-CM | POA: Insufficient documentation

## 2019-10-25 IMAGING — MR MR HEAD W/O CM
11 series · 44 of 48 positions shown · non-contrast
Comparison: None.

CLINICAL DATA: Memory changes.  Several falls.

EXAM:
MRI HEAD WITHOUT CONTRAST
TECHNIQUE: Multiplanar, multiecho pulse sequences of the brain and surrounding
structures were obtained without intravenous contrast.

[Series 5: ax dwi_tracew · axial · 3.0mm · 0.60mm/px · z∈[-139,+16]mm · 5 of 48 slices shown]
[im 1/48]
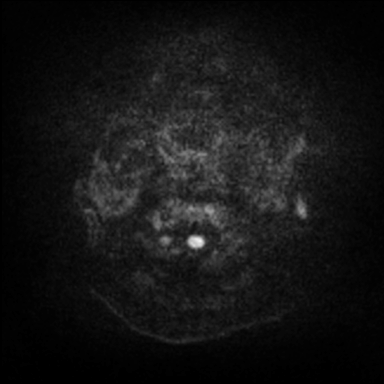
[im 12/48]
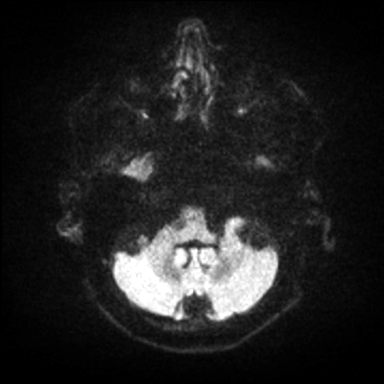
[im 24/48]
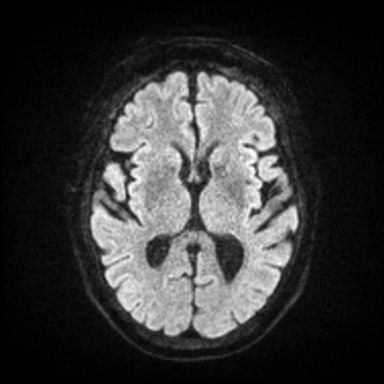
[im 36/48]
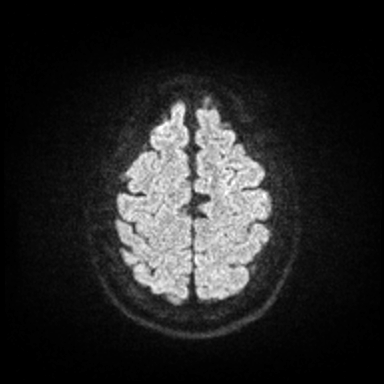
[im 48/48]
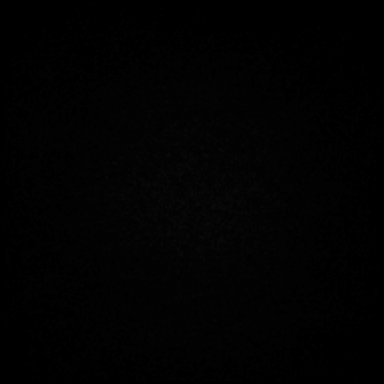

[Series 6: ax dwi_adc · axial · 3.0mm · 0.60mm/px · z∈[-139,+12]mm · 4 of 47 slices shown]
[im 1/47]
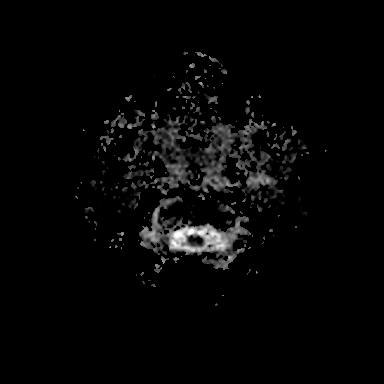
[im 16/47]
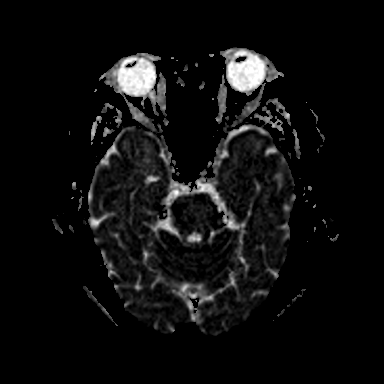
[im 31/47]
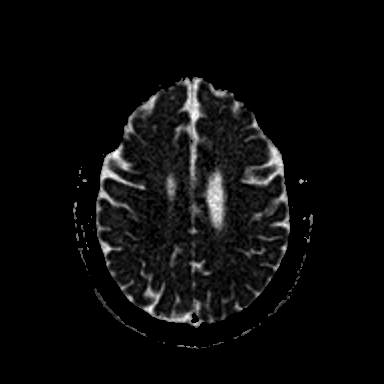
[im 47/47]
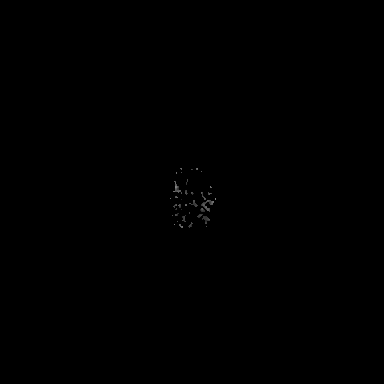

[Series 7: cor dwi_tracew · coronal · 5.0mm · 0.60mm/px · 3 of 40 slices shown]
[im 1/40]
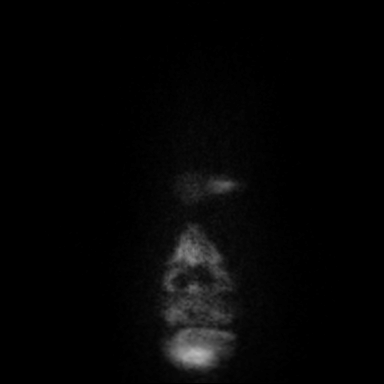
[im 20/40]
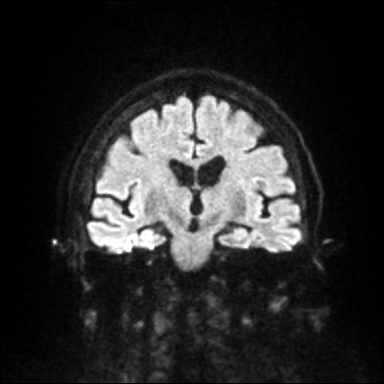
[im 40/40]
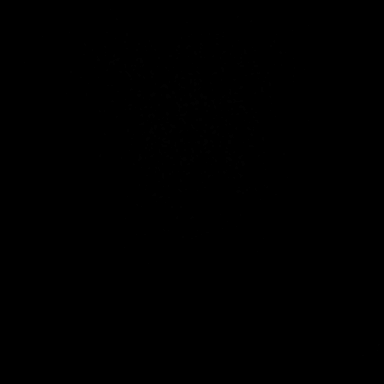

[Series 8: cor dwi_adc · coronal · 5.0mm · 0.60mm/px · 3 of 38 slices shown]
[im 1/38]
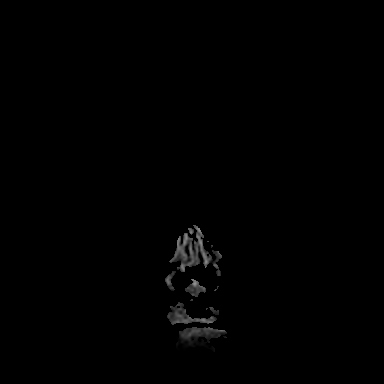
[im 19/38]
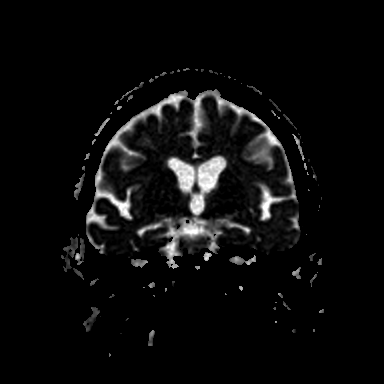
[im 38/38]
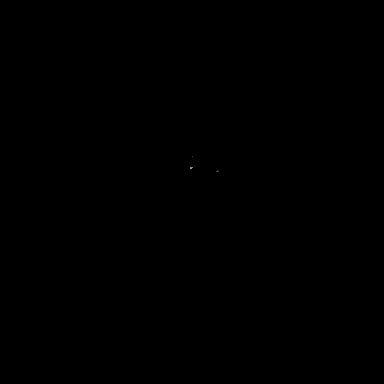

[Series 9: T1 · sagittal · 5.0mm · 0.62mm/px · 2 of 25 slices shown (1 of 2)]
[im 1/25]
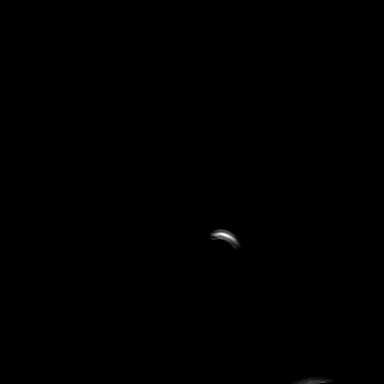
[im 25/25]
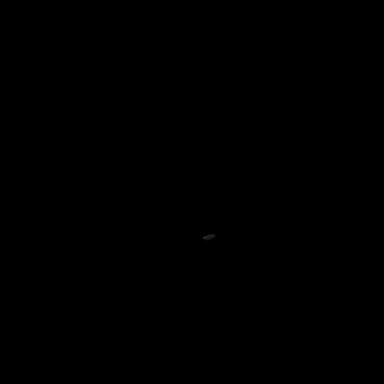

[Series 10: T2 · axial · 5.0mm · 0.53mm/px · z∈[-143,+13]mm · 2 of 27 slices shown (1 of 2)]
[im 1/27]
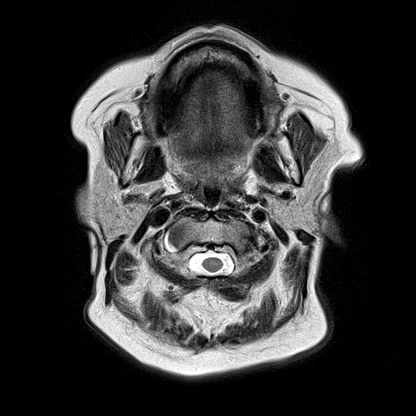
[im 27/27]
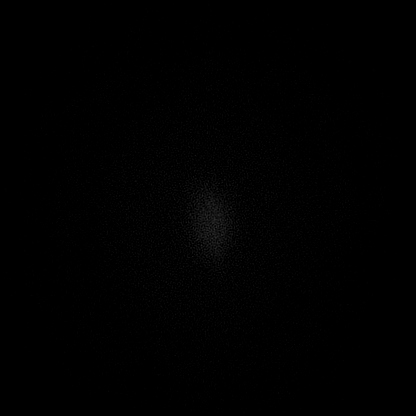

[Series 12: pha_images · axial · 3.0mm · 0.90mm/px · z∈[-154,+23]mm · 5 of 58 slices shown]
[im 1/58]
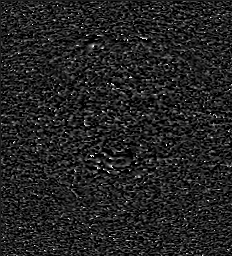
[im 15/58]
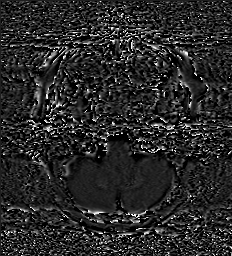
[im 29/58]
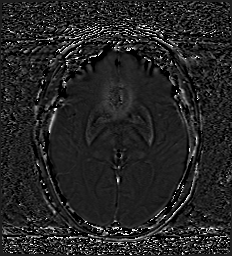
[im 43/58]
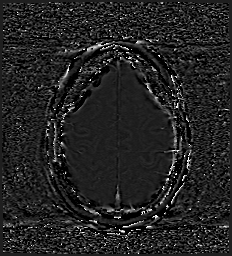
[im 58/58]
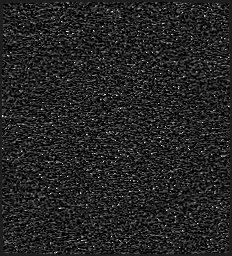

[Series 13: swi_images · axial · 3.0mm · 0.90mm/px · z∈[-154,+23]mm · 5 of 60 slices shown]
[im 1/60]
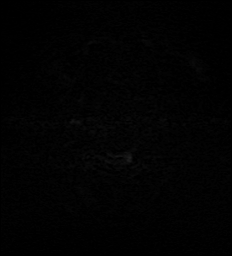
[im 15/60]
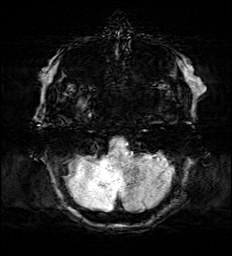
[im 30/60]
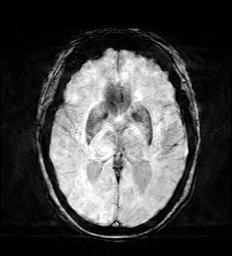
[im 45/60]
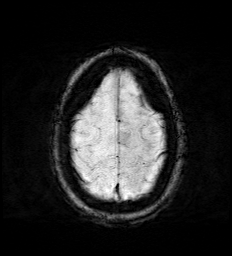
[im 60/60]
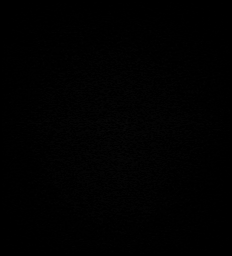

[Series 15: FLAIR · axial · 3.0mm · 0.53mm/px · z∈[-146,+16]mm · 4 of 55 slices shown]
[im 1/55]
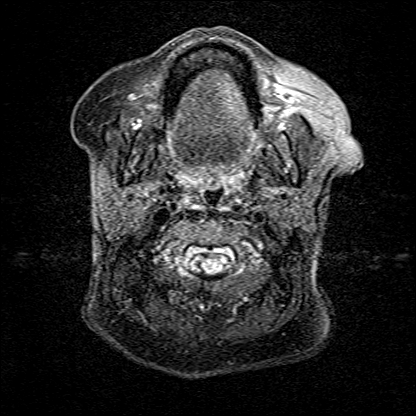
[im 19/55]
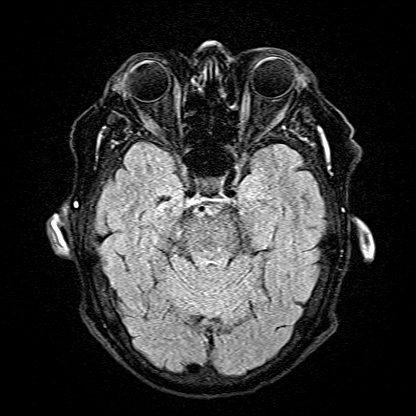
[im 37/55]
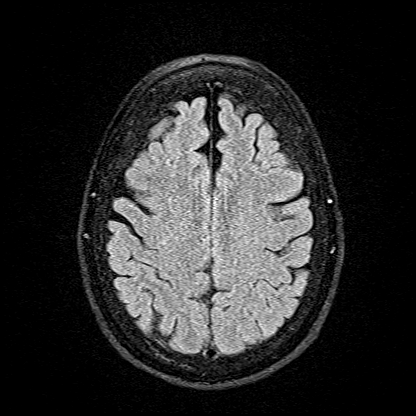
[im 55/55]
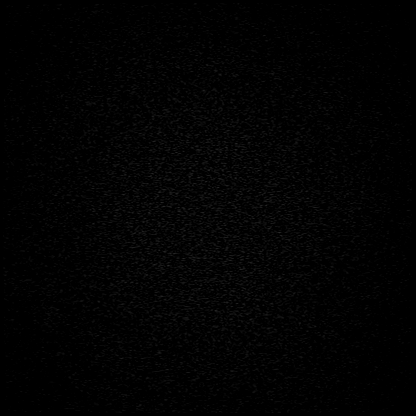

[Series 16: T1 · axial · 1.0mm · 0.98mm/px · z∈[-145,+14]mm · 9 of 160 slices shown (2 of 2)]
[im 1/160]
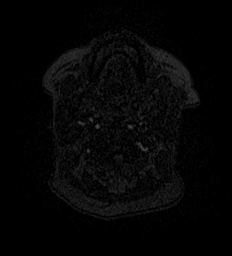
[im 14/160]
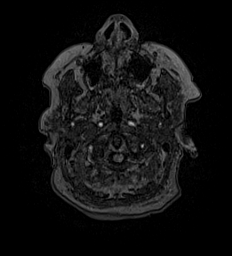
[im 27/160]
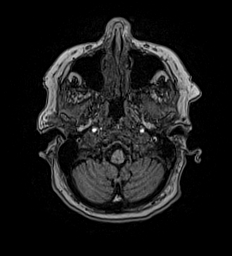
[im 54/160]
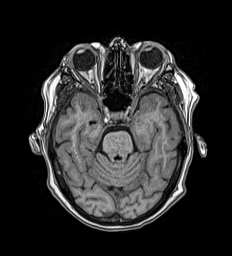
[im 67/160]
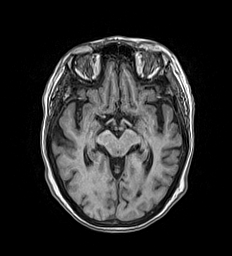
[im 93/160]
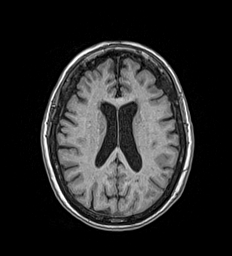
[im 107/160]
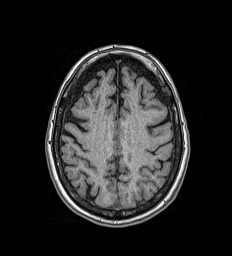
[im 133/160]
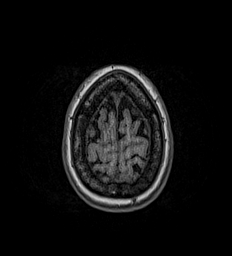
[im 160/160]
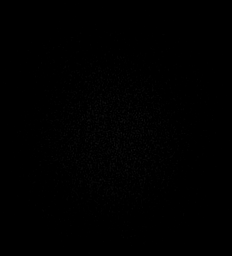

[Series 17: T2 · coronal · 5.0mm · 0.57mm/px · 2 of 29 slices shown (2 of 2)]
[im 1/29]
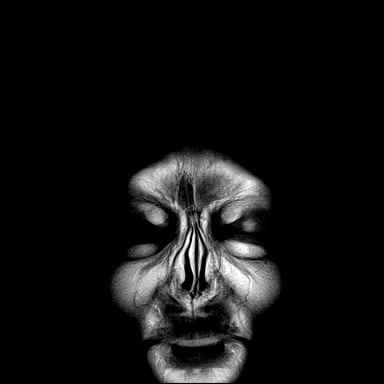
[im 29/29]
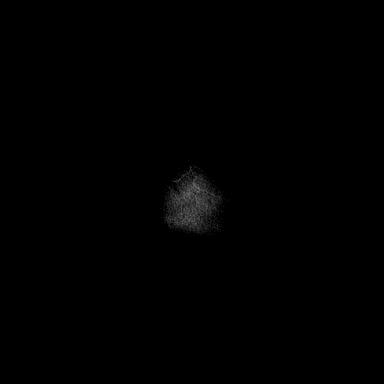

[44 of 48 positions shown; findings below may reference images not displayed]

FINDINGS: Brain: Mild cerebral atrophy, typical for age. Negative for
hydrocephalus. Few small white matter hyperintensities, less than
expected for age.

Negative for acute infarct, hemorrhage, mass.

Vascular: Normal arterial flow voids

Skull and upper cervical spine: No focal skeletal lesion.

Sinuses/Orbits: Paranasal sinuses clear. Left mastoid effusion.
Negative orbit

Other: None
IMPRESSION: No acute abnormality. Mild age related atrophy. Minimal chronic
white matter changes.

## 2019-11-13 DIAGNOSIS — R413 Other amnesia: Secondary | ICD-10-CM | POA: Insufficient documentation

## 2019-11-17 ENCOUNTER — Ambulatory Visit (INDEPENDENT_AMBULATORY_CARE_PROVIDER_SITE_OTHER): Payer: Medicare HMO | Admitting: Vascular Surgery

## 2019-11-17 ENCOUNTER — Other Ambulatory Visit: Payer: Self-pay

## 2019-11-17 ENCOUNTER — Ambulatory Visit (INDEPENDENT_AMBULATORY_CARE_PROVIDER_SITE_OTHER): Payer: Medicare HMO

## 2019-11-17 ENCOUNTER — Encounter (INDEPENDENT_AMBULATORY_CARE_PROVIDER_SITE_OTHER): Payer: Self-pay

## 2019-11-17 DIAGNOSIS — E118 Type 2 diabetes mellitus with unspecified complications: Secondary | ICD-10-CM

## 2019-11-17 DIAGNOSIS — E785 Hyperlipidemia, unspecified: Secondary | ICD-10-CM

## 2019-11-17 DIAGNOSIS — I70212 Atherosclerosis of native arteries of extremities with intermittent claudication, left leg: Secondary | ICD-10-CM

## 2019-11-17 DIAGNOSIS — I10 Essential (primary) hypertension: Secondary | ICD-10-CM

## 2019-11-17 DIAGNOSIS — D6851 Activated protein C resistance: Secondary | ICD-10-CM

## 2019-11-17 NOTE — Assessment & Plan Note (Signed)
blood glucose control important in reducing the progression of atherosclerotic disease. Also, involved in wound healing. On appropriate medications.  

## 2019-11-17 NOTE — Progress Notes (Addendum)
Patient had study but not seen by me as I was in a procedure

## 2019-11-20 ENCOUNTER — Encounter (INDEPENDENT_AMBULATORY_CARE_PROVIDER_SITE_OTHER): Payer: Self-pay | Admitting: Vascular Surgery

## 2020-03-14 ENCOUNTER — Other Ambulatory Visit
Admission: RE | Admit: 2020-03-14 | Discharge: 2020-03-14 | Disposition: A | Discharge status: Home / Self Care | Payer: Medicare HMO | Source: Intra-hospital | Attending: Internal Medicine | Admitting: Internal Medicine

## 2020-03-14 DIAGNOSIS — I4891 Unspecified atrial fibrillation: Secondary | ICD-10-CM | POA: Diagnosis present

## 2020-03-14 DIAGNOSIS — R079 Chest pain, unspecified: Secondary | ICD-10-CM | POA: Diagnosis present

## 2020-03-14 LAB — FIBRIN DERIVATIVES D-DIMER (ARMC ONLY): Fibrin derivatives D-dimer (ARMC): 500.65 ng/mL (FEU) — ABNORMAL HIGH (ref 0.00–499.00)

## 2020-03-14 LAB — TROPONIN I (HIGH SENSITIVITY): Troponin I (High Sensitivity): 5 ng/L (ref <18)

## 2020-04-06 NOTE — Progress Notes (Deleted)
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  Heterozygote factor V 5 Leiden.   INTERVAL HISTORY: Patient returns to clinic today for routine 41-month evaluation. She had a fall recently fracturing her left wrist, but otherwise has felt well.  She continues to tolerate letrozole and Coumadin without significant side effects.  She has no neurologic complaints. She denies any recent fevers or illnesses. She denies any chest pain, cough, hemoptysis, or shortness of breath. She denies any nausea, vomiting, constipation or diarrhea. She has no melena or hematochezia. She has no urinary complaints.  Patient offers no specific complaints today.  REVIEW OF SYSTEMS:   Review of Systems  Constitutional: Negative.  Negative for fever, malaise/fatigue and weight loss.  Respiratory: Negative.  Negative for cough and shortness of breath.   Cardiovascular: Negative.  Negative for chest pain and leg swelling.  Gastrointestinal: Negative.  Negative for abdominal pain, blood in stool and melena.  Genitourinary: Negative.  Negative for hematuria.  Musculoskeletal: Negative.  Negative for back pain.  Skin: Negative.  Negative for rash.  Neurological: Negative.  Negative for focal weakness, weakness and headaches.  Endo/Heme/Allergies: Does not bruise/bleed easily.  Psychiatric/Behavioral: Negative.  The patient is not nervous/anxious.     As per HPI. Otherwise, a complete review of systems is negative.  PAST MEDICAL HISTORY: Past Medical History:  Diagnosis Date  . Actinic keratosis 06/29/2006   R pretibial mid, and med (bx proven)  . Atrial fibrillation ([place])   . Breast cancer ([place]) 08/2013   ER  positive adenocarcinoma of Left Breast. with rad tx  . DVT (deep venous thrombosis) ([place])   . Hypercholesterolemia   . Hypertension   . 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 Personal history of chemotherapy    1 round  . Personal history of radiation therapy   . Skin cancer    squamous cell  . Squamous cell carcinoma of skin 03/24/2006   L sup pretibial   . Squamous cell carcinoma of skin 03/24/2006   R pretibial inf   . Squamous cell carcinoma of skin 05/25/2006   L lat mid lower leg   . Squamous cell carcinoma of skin 06/29/2006   R pretibial lat   . Squamous cell carcinoma of skin 10/17/2012   R prox pretibial  ED&C  . Squamous cell carcinoma of skin 12/19/2012   R prox sup pretibial   . Squamous cell carcinoma of skin 02/03/2016   L med mid pretibial   . Squamous cell carcinoma of skin 08/20/2016   L pretibial sup   . Squamous cell carcinoma of skin 08/20/2016   L pretibial inf   . Squamous cell carcinoma of skin 10/10/2018   L prox med pretibial   . Squamous cell carcinoma of skin 06/29/2019   L calf     PAST SURGICAL  HISTORY: Past Surgical History:  Procedure Laterality Date  . BREAST BIOPSY Left 09/04/2013   negative stereo bx  . BREAST BIOPSY Left 08/25/2013   postive [place] core bx  . BREAST LUMPECTOMY Left 08/2017  . BREAST LUMPECTOMY W/ NEEDLE LOCALIZATION Left 2015  . ESOPHAGOGASTRODUODENOSCOPY (EGD) WITH PROPOFOL N/A 08/05/2018   Procedure: ESOPHAGOGASTRODUODENOSCOPY (EGD) WITH PROPOFOL;  Surgeon: Lucilla Lame, MD;  Location: [facility] ENDOSCOPY;  Service: Endoscopy;  Laterality: N/A;  . GANGLION CYST EXCISION    . OPEN REDUCTION INTERNAL FIXATION (ORIF) DISTAL RADIAL FRACTURE Left 07/11/2019   Procedure: OPEN REDUCTION INTERNAL FIXATION (ORIF) DISTAL RADIAL FRACTURE;  Surgeon: Hessie Knows, MD;  Location: [facility] ORS;  Service: Orthopedics;  Laterality: Left;    FAMILY HISTORY Family History  Problem Relation Age of Onset  . Cancer Father        colon  . CAD Father   . Hypertension Father   . Hyperlipidemia Father   . CAD Mother   . Breast cancer Neg Hx        ADVANCED DIRECTIVES:    HEALTH MAINTENANCE: Social History   Tobacco Use  . Smoking status: Former Research scientist (life sciences)  . Smokeless tobacco: Never Used  Substance Use Topics  . Alcohol use: Yes    Comment: social  . Drug use: No     Colonoscopy:  PAP:  Bone density:  Lipid panel:  Allergies  Allergen Reactions  . Metoprolol Shortness Of Breath  . Other Other (See Comments)    Sodium pentothal  Causes blood clots    Current Outpatient Medications  Medication Sig Dispense Refill  . alendronate (FOSAMAX) 70 MG tablet Take 1 tablet (70 mg total) by mouth once a week. Take with a full glass of water on an empty stomach. 12 tablet 3  . alum & mag hydroxide-simeth (MAALOX/MYLANTA) 200-200-20 MG/5ML suspension Take 30 mLs by mouth every 6 (six) hours as needed for indigestion or heartburn. 355 mL 0  . aspirin EC 81 MG tablet Take 81 mg by mouth daily.     . busPIRone (BUSPAR) 10 MG tablet Take 10 mg by mouth daily.     . citalopram (CELEXA)  20 MG tablet Take 20 mg by mouth daily.     Marland Kitchen diltiazem (TIAZAC) 240 MG 24 hr capsule Take 240 mg by mouth daily.     Marland Kitchen HYDROcodone-acetaminophen (NORCO) 5-325 MG tablet Take 1 tablet by mouth every 6 (six) hours as needed for moderate pain. 20 tablet 0  . letrozole (FEMARA) 2.5 MG tablet TAKE ONE TABLET BY MOUTH DAILY 90 tablet 2  . LORazepam (ATIVAN) 1 MG tablet Take 1 mg by mouth daily as needed for anxiety.     . Multiple Vitamins-Minerals (MULTIVITAMIN WITH MINERALS) tablet Take 1 tablet by mouth daily. Alive    . pantoprazole (PROTONIX) 40 MG tablet Take 1 tablet (40 mg total) by mouth daily. 30 tablet 11  . pravastatin (PRAVACHOL) 40 MG tablet Take 40 mg by  mouth daily.     Marland Kitchen triamterene-hydrochlorothiazide (MAXZIDE-25) 37.5-25 MG per tablet Take 1 tablet by mouth daily.     . vitamin B-12 (CYANOCOBALAMIN) 1000 MCG tablet Take 1,000 mcg by mouth daily.    Marland Kitchen warfarin (COUMADIN) 2 MG tablet Take 2 mg by mouth See admin instructions. Take with 5 mg for a total of 7 mg    . warfarin (COUMADIN) 5 MG tablet Take 5 mg by mouth See admin instructions. Takes along with a 2mg  to equal 7mg  daily     No current facility-administered medications for this visit.    OBJECTIVE: There were no vitals filed for this visit.   There is no height or weight on file to calculate BMI.    ECOG FS:0 - Asymptomatic  General: Well-developed, well-nourished, no acute distress. Eyes: Pink conjunctiva, anicteric sclera. HEENT: Normocephalic, moist mucous membranes. Lungs: No audible wheezing or coughing. Heart: Regular rate and rhythm. Abdomen: Soft, nontender, no obvious distention. Musculoskeletal: No edema, cyanosis, or clubbing. Neuro: Alert, answering all questions appropriately. Cranial nerves grossly intact. Skin: No rashes or petechiae noted. Psych: Normal affect.   LAB RESULTS:  Lab Results  Component Value Date   NA 138 07/11/2019   K 3.4 (L) 07/11/2019   CL 103 07/11/2019   CO2 24 07/11/2019    GLUCOSE 147 (H) 07/11/2019   BUN 13 07/11/2019   CREATININE 0.78 07/11/2019   CALCIUM 9.2 07/11/2019   PROT 7.3 08/05/2018   ALBUMIN 3.9 08/05/2018   AST 55 (H) 08/05/2018   ALT 77 (H) 08/05/2018   ALKPHOS 84 08/05/2018   BILITOT 1.1 08/05/2018   GFRNONAA >60 07/11/2019   GFRAA >60 07/11/2019    Lab Results  Component Value Date   WBC 7.6 07/11/2019   NEUTROABS 11.3 (H) 08/05/2018   HGB 14.5 07/11/2019   HCT 41.9 07/11/2019   MCV 82.8 07/11/2019   PLT 236 07/11/2019     STUDIES: No results found.  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  Heterozygote factor V 5 Leiden.  PLAN:    1. 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 She received one cycle of Taxotere and Cytoxan, but secondary to significant toxicity this was discontinued.  Because of her increased Oncotype score, patient agreed to take a total of 7 years of letrozole and has been instructed to continue treatment until her next clinic visit.  Her most recent mammogram on October 05, 2019 was reported as BI-RADS 1.  Repeat in July 2022.  Return to clinic in 6 months for routine evaluation.   2. Heterozygote factor V 5 Leiden: Patient was diagnosed with PE and DVT at an outside hospital. She completed 6 months of treatment with Xarelto, but given her increased risk she elected to stay on anticoagulation lifelong.  She is now on Coumadin and INR is managed by her primary care physician.  Goal INR is 2.0-3.0.     3. Osteopenia: Patient's most recent bone mineral density on October 05, 2019 reported T score of -2.4 which is slightly worse than 1 year prior.  Given her recent fracture wrist, patient has agreed to initiate Fosamax once per week.  Continue calcium and vitamin D supplementation as well as increased  dietary intake.  Repeat bone density in July 2022.    Patient  expressed understanding and was in agreement with this plan. She also understands that She can call clinic at any time with any questions, concerns, or complaints.   Breast cancer   Staging form: Breast, AJCC 7th Edition     Clinical stage from 09/22/2014: Stage IA (T1b, N0, M0) - Signed by Lloyd Huger, MD on 09/22/2014   Lloyd Huger, MD   04/06/2020 8:32 AM

## 2020-04-11 ENCOUNTER — Inpatient Hospital Stay: Payer: Medicare HMO | Attending: Oncology | Admitting: Oncology

## 2020-04-11 DIAGNOSIS — C50512 Malignant neoplasm of lower-outer quadrant of left female breast: Secondary | ICD-10-CM

## 2020-06-20 ENCOUNTER — Encounter: Payer: Self-pay | Admitting: Oncology

## 2020-06-20 ENCOUNTER — Inpatient Hospital Stay: Payer: Medicare HMO | Attending: Oncology | Admitting: Oncology

## 2020-06-20 VITALS — BP 159/93 | HR 96 | Temp 98.4°F | Resp 20 | Wt 202.0 lb

## 2020-06-20 DIAGNOSIS — Z170 Estrogen receptor positive status [ER+]: Secondary | ICD-10-CM | POA: Insufficient documentation

## 2020-06-20 DIAGNOSIS — Z9221 Personal history of antineoplastic chemotherapy: Secondary | ICD-10-CM | POA: Diagnosis not present

## 2020-06-20 DIAGNOSIS — Z7901 Long term (current) use of anticoagulants: Secondary | ICD-10-CM | POA: Insufficient documentation

## 2020-06-20 DIAGNOSIS — C50512 Malignant neoplasm of lower-outer quadrant of left female breast: Secondary | ICD-10-CM | POA: Insufficient documentation

## 2020-06-20 DIAGNOSIS — D6851 Activated protein C resistance: Secondary | ICD-10-CM | POA: Diagnosis not present

## 2020-06-20 DIAGNOSIS — I4891 Unspecified atrial fibrillation: Secondary | ICD-10-CM | POA: Insufficient documentation

## 2020-06-20 DIAGNOSIS — M8580 Other specified disorders of bone density and structure, unspecified site: Secondary | ICD-10-CM | POA: Diagnosis not present

## 2020-06-20 DIAGNOSIS — Z923 Personal history of irradiation: Secondary | ICD-10-CM | POA: Diagnosis not present

## 2020-06-20 DIAGNOSIS — E7800 Pure hypercholesterolemia, unspecified: Secondary | ICD-10-CM | POA: Diagnosis not present

## 2020-06-20 DIAGNOSIS — Z800 Family history of malignant neoplasm of digestive organs: Secondary | ICD-10-CM | POA: Insufficient documentation

## 2020-06-20 DIAGNOSIS — Z79899 Other long term (current) drug therapy: Secondary | ICD-10-CM | POA: Insufficient documentation

## 2020-06-20 DIAGNOSIS — Z86718 Personal history of other venous thrombosis and embolism: Secondary | ICD-10-CM | POA: Diagnosis not present

## 2020-06-20 DIAGNOSIS — Z7982 Long term (current) use of aspirin: Secondary | ICD-10-CM | POA: Diagnosis not present

## 2020-06-20 DIAGNOSIS — I10 Essential (primary) hypertension: Secondary | ICD-10-CM | POA: Insufficient documentation

## 2020-06-20 NOTE — Progress Notes (Signed)
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  Heterozygote factor V 5 Leiden.   INTERVAL HISTORY: Patient returns to clinic today for routine 32-month evaluation.  She currently feels well and is asymptomatic.  She is tolerating letrozole and Coumadin well without significant side effects.  She has no neurologic complaints. She denies any recent fevers or illnesses. She denies any chest pain, cough, hemoptysis, or shortness of breath. She denies any nausea, vomiting, constipation or diarrhea. She has no melena or hematochezia. She has no urinary complaints.  Patient feels at her baseline and offers no specific complaints today.  REVIEW OF SYSTEMS:   Review of Systems  Constitutional: Negative.  Negative for fever, malaise/fatigue and weight loss.  Respiratory: Negative.  Negative for cough and shortness of breath.   Cardiovascular: Negative.  Negative for chest pain and leg swelling.  Gastrointestinal: Negative.  Negative for abdominal pain, blood in stool and melena.  Genitourinary: Negative.  Negative for hematuria.  Musculoskeletal: Negative.  Negative for back pain.  Skin: Negative.  Negative for rash.  Neurological: Negative.  Negative for focal weakness, weakness and headaches.  Endo/Heme/Allergies: Does not bruise/bleed easily.  Psychiatric/Behavioral: Negative.  The patient is not nervous/anxious.     As per HPI. Otherwise, a complete review of systems is negative.  PAST MEDICAL HISTORY: Past Medical History:  Diagnosis Date  . Actinic keratosis 06/29/2006   R pretibial mid, and med (bx proven)  . Atrial fibrillation ([place])   . Breast cancer ([place]) 08/2013   ER positive  adenocarcinoma of Left Breast. with rad tx  . DVT (deep venous thrombosis) ([place])   . Hypercholesterolemia   . Hypertension   . 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 Personal history of chemotherapy    1 round  . Personal history of radiation therapy   . Skin cancer    squamous cell  . Squamous cell carcinoma of skin 03/24/2006   L sup pretibial   . Squamous cell carcinoma of skin 03/24/2006   R pretibial inf   . Squamous cell carcinoma of skin 05/25/2006   L lat mid lower leg   . Squamous cell carcinoma of skin 06/29/2006   R pretibial lat   . Squamous cell carcinoma of skin 10/17/2012   R prox pretibial ED&C  .  Squamous cell carcinoma of skin 12/19/2012   R prox sup pretibial   . Squamous cell carcinoma of skin 02/03/2016   L med mid pretibial   . Squamous cell carcinoma of skin 08/20/2016   L pretibial sup   . Squamous cell carcinoma of skin 08/20/2016   L pretibial inf   . Squamous cell carcinoma of skin 10/10/2018   L prox med pretibial   . Squamous cell carcinoma of skin 06/29/2019   L calf     PAST SURGICAL  HISTORY: Past Surgical History:  Procedure Laterality Date  . BREAST BIOPSY Left 09/04/2013   negative stereo bx  . BREAST BIOPSY Left 08/25/2013   postive [place] core bx  . BREAST LUMPECTOMY Left 08/2017  . BREAST LUMPECTOMY W/ NEEDLE LOCALIZATION Left 2015  . ESOPHAGOGASTRODUODENOSCOPY (EGD) WITH PROPOFOL N/A 08/05/2018   Procedure: ESOPHAGOGASTRODUODENOSCOPY (EGD) WITH PROPOFOL;  Surgeon: Lucilla Lame, MD;  Location: [facility] ENDOSCOPY;  Service: Endoscopy;  Laterality: N/A;  . GANGLION CYST EXCISION    . OPEN REDUCTION INTERNAL FIXATION (ORIF) DISTAL RADIAL FRACTURE Left 07/11/2019   Procedure: OPEN REDUCTION INTERNAL FIXATION (ORIF) DISTAL RADIAL FRACTURE;  Surgeon: Hessie Knows, MD;  Location: [facility] ORS;  Service: Orthopedics;  Laterality: Left;    FAMILY HISTORY Family History  Problem Relation Age of Onset  . Cancer Father        colon  . CAD Father   . Hypertension Father   . Hyperlipidemia Father   . CAD Mother   . Breast cancer Neg Hx        ADVANCED DIRECTIVES:    HEALTH MAINTENANCE: Social History   Tobacco Use  . Smoking status: Former Research scientist (life sciences)  . Smokeless tobacco: Never Used  Substance Use Topics  . Alcohol use: Yes    Comment: social  . Drug use: No     Colonoscopy:  PAP:  Bone density:  Lipid panel:  Allergies  Allergen Reactions  . Metoprolol Shortness Of Breath  . Other Other (See Comments)    Sodium pentothal  Causes blood clots    Current Outpatient Medications  Medication Sig Dispense Refill  . alendronate (FOSAMAX) 70 MG tablet Take 1 tablet (70 mg total) by mouth once a week. Take with a full glass of water on an empty stomach. 12 tablet 3  . alum & mag hydroxide-simeth (MAALOX/MYLANTA) 200-200-20 MG/5ML suspension Take 30 mLs by mouth every 6 (six) hours as needed for indigestion or heartburn. 355 mL 0  . aspirin EC 81 MG tablet Take 81 mg by mouth daily.     . busPIRone (BUSPAR) 10 MG tablet Take 10 mg by mouth daily.     . citalopram (CELEXA)  20 MG tablet Take 20 mg by mouth daily.     Marland Kitchen diltiazem (TIAZAC) 240 MG 24 hr capsule Take 240 mg by mouth daily.     Marland Kitchen HYDROcodone-acetaminophen (NORCO) 5-325 MG tablet Take 1 tablet by mouth every 6 (six) hours as needed for moderate pain. 20 tablet 0  . letrozole (FEMARA) 2.5 MG tablet TAKE ONE TABLET BY MOUTH DAILY 90 tablet 2  . LORazepam (ATIVAN) 1 MG tablet Take 1 mg by mouth daily as needed for anxiety.     . pravastatin (PRAVACHOL) 40 MG tablet Take 40 mg by mouth daily.     Marland Kitchen triamterene-hydrochlorothiazide (MAXZIDE-25) 37.5-25 MG per tablet Take 1 tablet by mouth daily.     Marland Kitchen warfarin (COUMADIN) 5 MG tablet Take 5 mg by mouth See admin instructions. Takes along  with a 2mg  to equal 7mg  daily    . Multiple Vitamins-Minerals (MULTIVITAMIN WITH MINERALS) tablet Take 1 tablet by mouth daily. Alive (Patient not taking: Reported on 06/20/2020)    . pantoprazole (PROTONIX) 40 MG tablet Take 1 tablet (40 mg total) by mouth daily. 30 tablet 11  . warfarin (COUMADIN) 2 MG tablet Take 2 mg by mouth See admin instructions. Take with 5 mg for a total of 7 mg     No current facility-administered medications for this visit.    OBJECTIVE: Vitals:   06/20/20 1034  BP: (!) 159/93  Pulse: 96  Resp: 20  Temp: 98.4 F (36.9 C)     Body mass index is 33.61 kg/m.    ECOG FS:0 - Asymptomatic  General: Well-developed, well-nourished, no acute distress. Eyes: Pink conjunctiva, anicteric sclera. HEENT: Normocephalic, moist mucous membranes. Breast: Bilateral breast and axilla without lumps or masses. Lungs: No audible wheezing or coughing. Heart: Regular rate and rhythm. Abdomen: Soft, nontender, no obvious distention. Musculoskeletal: No edema, cyanosis, or clubbing. Neuro: Alert, answering all questions appropriately. Cranial nerves grossly intact. Skin: No rashes or petechiae noted. Psych: Normal affect.  LAB RESULTS:  Lab Results  Component Value Date   NA 138 07/11/2019   K 3.4 (L)  07/11/2019   CL 103 07/11/2019   CO2 24 07/11/2019   GLUCOSE 147 (H) 07/11/2019   BUN 13 07/11/2019   CREATININE 0.78 07/11/2019   CALCIUM 9.2 07/11/2019   PROT 7.3 08/05/2018   ALBUMIN 3.9 08/05/2018   AST 55 (H) 08/05/2018   ALT 77 (H) 08/05/2018   ALKPHOS 84 08/05/2018   BILITOT 1.1 08/05/2018   GFRNONAA >60 07/11/2019   GFRAA >60 07/11/2019    Lab Results  Component Value Date   WBC 7.6 07/11/2019   NEUTROABS 11.3 (H) 08/05/2018   HGB 14.5 07/11/2019   HCT 41.9 07/11/2019   MCV 82.8 07/11/2019   PLT 236 07/11/2019     STUDIES: No results found.  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  Heterozygote factor V 5 Leiden.  PLAN:    1. 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 She received one cycle of Taxotere and Cytoxan, but secondary to significant toxicity this was discontinued.  Because of her increased Oncotype score, patient agreed to take a total of 7 years of letrozole which she will complete in April 2022.  She has been instructed to complete her current prescription and then discontinue treatment.  Her most recent mammogram on October 05, 2019 was reported as BI-RADS 1.  Repeat in July 2022.  Return to clinic in 1 year for routine evaluation.   2. Heterozygote factor V 5 Leiden: Patient was diagnosed with PE and DVT at an outside hospital. She completed 6 months of treatment with Xarelto, but given her increased risk she elected to stay on anticoagulation lifelong.  She is now on Coumadin and INR is managed by her primary care physician.  Goal INR is 2.0-3.0.     3. Osteopenia: Patient's most recent bone mineral density on October 05, 2019 reported T score of -2.4 which is slightly worse than 1 year prior.  Continue Fosamax, calcium, and vitamin D.  Repeat  bone mineral density in July 2022 along with mammogram as above.   Patient expressed understanding  and was in agreement with this plan. She also understands that She can call clinic at any time with any questions, concerns, or complaints.   Breast cancer   Staging form: Breast, AJCC 7th Edition     Clinical stage from 09/22/2014: Stage IA (T1b, N0, M0) - Signed by Lloyd Huger, MD on 09/22/2014   Lloyd Huger, MD   06/20/2020 10:42 AM

## 2020-06-20 NOTE — Progress Notes (Signed)
Patient denies any concerns today.  

## 2020-06-28 ENCOUNTER — Other Ambulatory Visit: Payer: Self-pay | Admitting: Oncology

## 2020-08-24 ENCOUNTER — Other Ambulatory Visit: Payer: Self-pay | Admitting: Oncology

## 2020-08-28 ENCOUNTER — Other Ambulatory Visit: Payer: Self-pay | Admitting: Oncology

## 2020-10-08 ENCOUNTER — Other Ambulatory Visit: Payer: Self-pay

## 2020-10-08 ENCOUNTER — Ambulatory Visit
Admission: RE | Admit: 2020-10-08 | Discharge: 2020-10-08 | Disposition: A | Discharge status: Home / Self Care | Payer: Medicare HMO | Source: Ambulatory Visit | Attending: Oncology | Admitting: Oncology

## 2020-10-08 DIAGNOSIS — Z1231 Encounter for screening mammogram for malignant neoplasm of breast: Secondary | ICD-10-CM | POA: Diagnosis not present

## 2020-10-08 DIAGNOSIS — Z780 Asymptomatic menopausal state: Secondary | ICD-10-CM | POA: Diagnosis not present

## 2020-10-08 DIAGNOSIS — C50512 Malignant neoplasm of lower-outer quadrant of left female breast: Secondary | ICD-10-CM

## 2020-10-08 DIAGNOSIS — Z79811 Long term (current) use of aromatase inhibitors: Secondary | ICD-10-CM | POA: Diagnosis not present

## 2020-10-08 DIAGNOSIS — Z853 Personal history of malignant neoplasm of breast: Secondary | ICD-10-CM | POA: Diagnosis not present

## 2020-10-08 IMAGING — MG MM DIGITAL SCREENING BILAT W/ TOMO AND CAD
8 series · 8 of 24 positions shown · non-contrast
Comparison: Previous exam(s).

CLINICAL DATA: Screening.

EXAM:
DIGITAL SCREENING BILATERAL MAMMOGRAM WITH TOMOSYNTHESIS AND CAD
TECHNIQUE: Bilateral screening digital craniocaudal and mediolateral oblique
mammograms were obtained. Bilateral screening digital breast
tomosynthesis was performed. The images were evaluated with
computer-aided detection.

[L CC synth-2D]
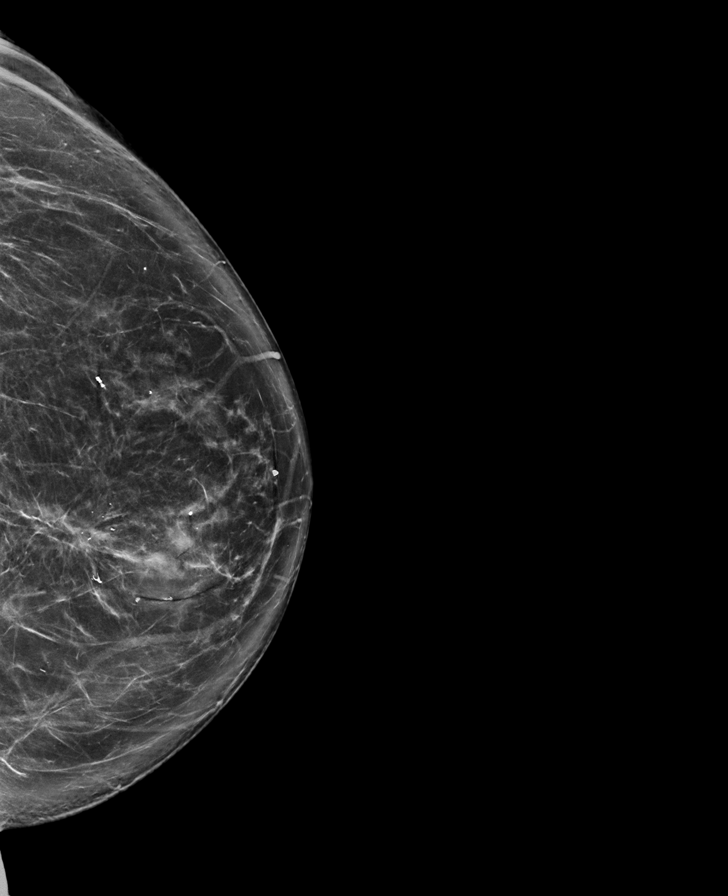

[L MLO synth-2D]
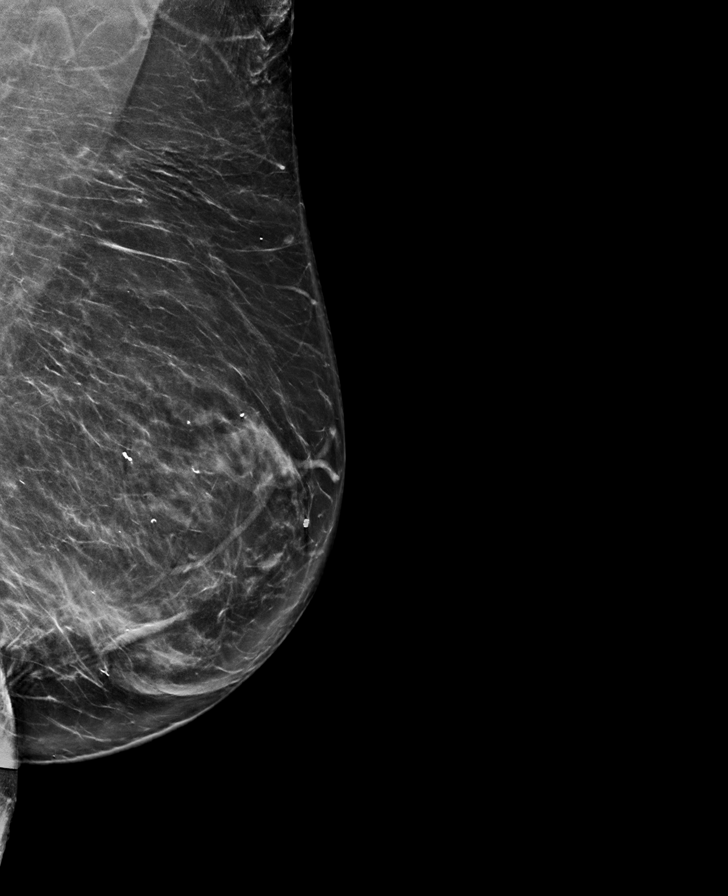

[R CC synth-2D]
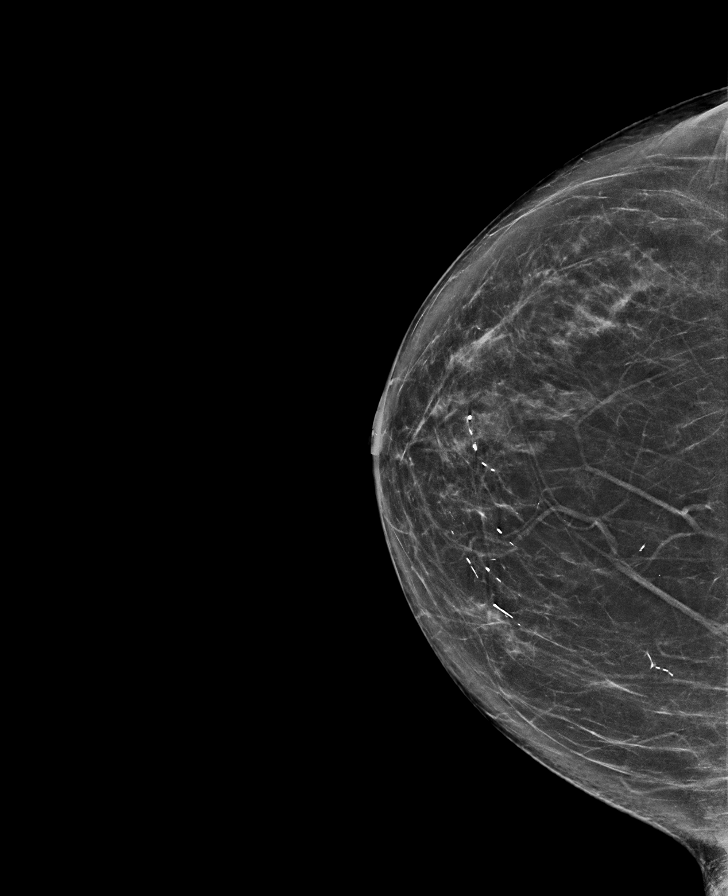

[R MLO synth-2D]
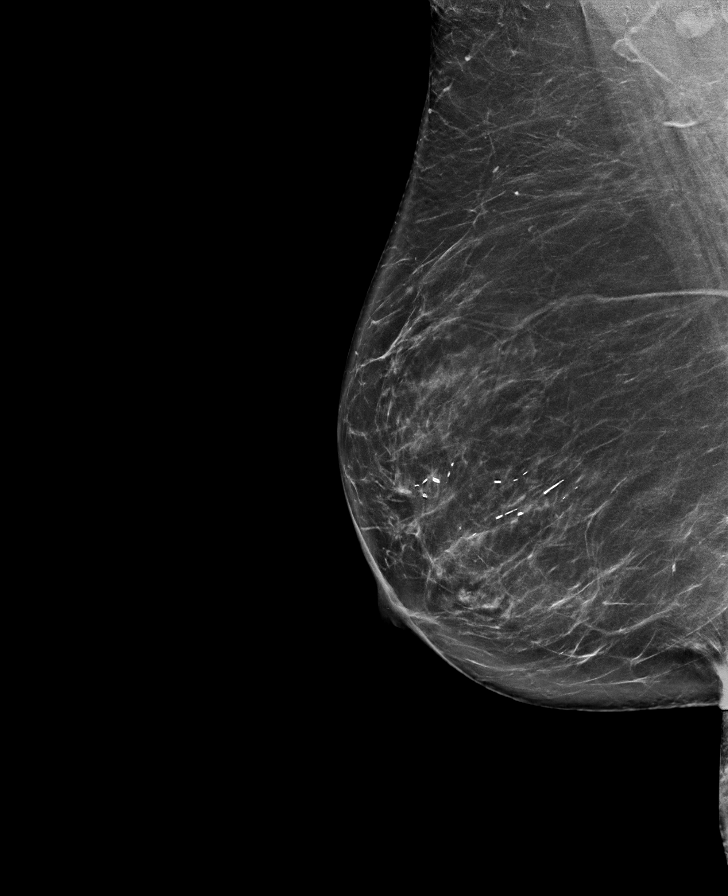

[R MLO tomo · tomo slice 47/92.0]
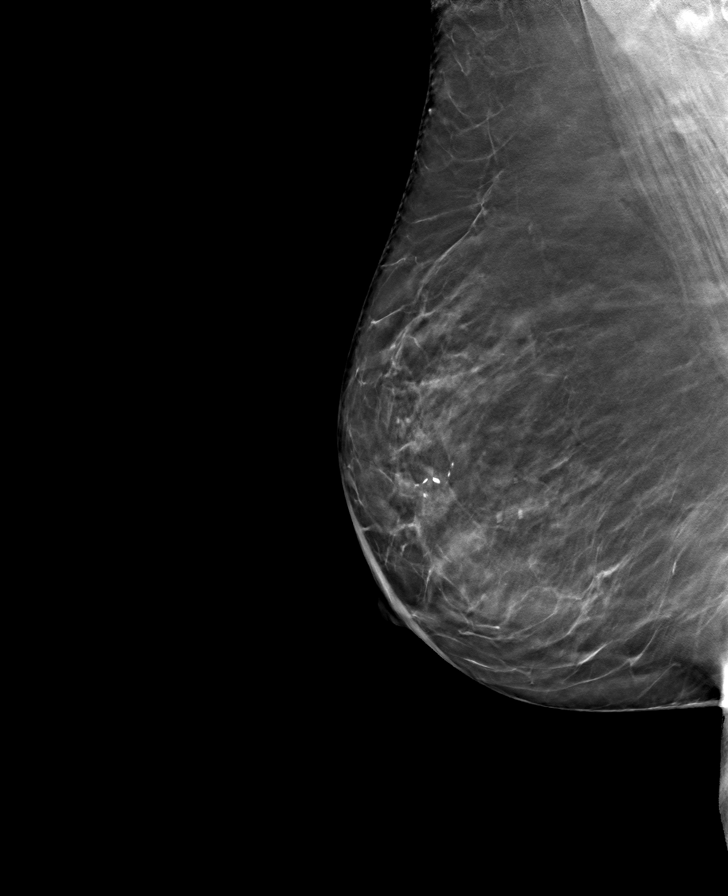

[L MLO tomo · tomo slice 47/93.0]
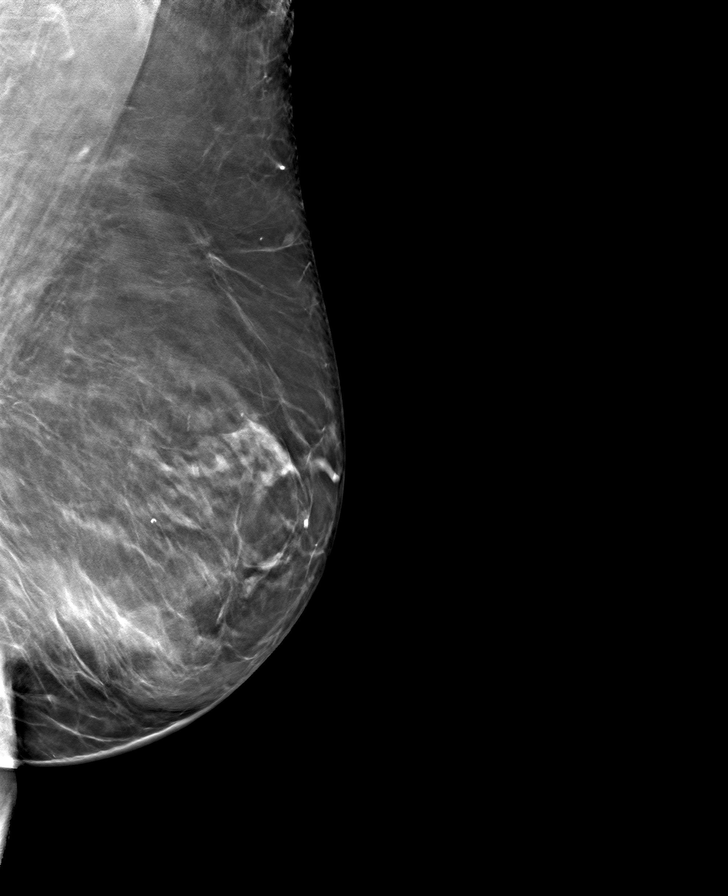

[R CC tomo · tomo slice 43/84.0]
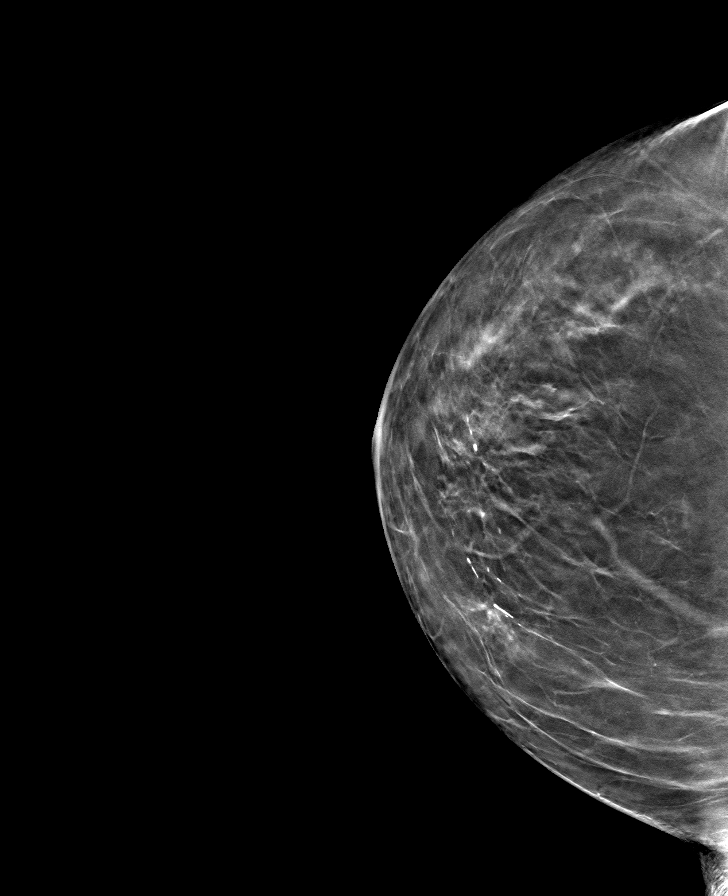

[L CC tomo · tomo slice 48/95.0]
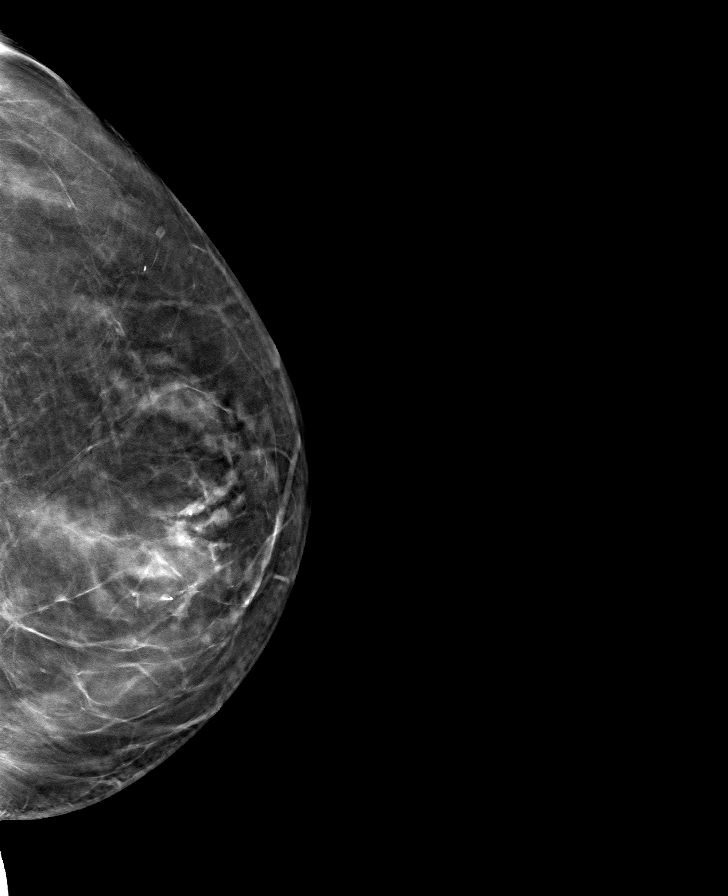

[8 of 24 positions shown; findings below may reference images not displayed]

ACR Breast Density Category b: There are scattered areas of
fibroglandular density.
FINDINGS: There are no findings suspicious for malignancy.
IMPRESSION: No mammographic evidence of malignancy. A result letter of this
screening mammogram will be mailed directly to the patient.

RECOMMENDATION:
Screening mammogram in one year. (Code:[BY])

BI-RADS CATEGORY  1: Negative.

## 2020-10-17 ENCOUNTER — Other Ambulatory Visit: Payer: Self-pay | Admitting: Oncology

## 2020-11-08 ENCOUNTER — Other Ambulatory Visit (INDEPENDENT_AMBULATORY_CARE_PROVIDER_SITE_OTHER): Payer: Self-pay | Admitting: Vascular Surgery

## 2020-11-08 DIAGNOSIS — I739 Peripheral vascular disease, unspecified: Secondary | ICD-10-CM

## 2020-11-15 ENCOUNTER — Ambulatory Visit (INDEPENDENT_AMBULATORY_CARE_PROVIDER_SITE_OTHER): Payer: Medicare HMO

## 2020-11-15 ENCOUNTER — Other Ambulatory Visit: Payer: Self-pay

## 2020-11-15 ENCOUNTER — Ambulatory Visit (INDEPENDENT_AMBULATORY_CARE_PROVIDER_SITE_OTHER): Payer: Medicare HMO | Admitting: Nurse Practitioner

## 2020-11-15 VITALS — BP 149/90 | HR 96 | Ht 63.0 in | Wt 204.0 lb

## 2020-11-15 DIAGNOSIS — I10 Essential (primary) hypertension: Secondary | ICD-10-CM

## 2020-11-15 DIAGNOSIS — I70212 Atherosclerosis of native arteries of extremities with intermittent claudication, left leg: Secondary | ICD-10-CM | POA: Diagnosis not present

## 2020-11-15 DIAGNOSIS — E785 Hyperlipidemia, unspecified: Secondary | ICD-10-CM

## 2020-11-15 DIAGNOSIS — I739 Peripheral vascular disease, unspecified: Secondary | ICD-10-CM

## 2020-11-16 ENCOUNTER — Encounter (INDEPENDENT_AMBULATORY_CARE_PROVIDER_SITE_OTHER): Payer: Self-pay | Admitting: Nurse Practitioner

## 2020-11-16 NOTE — Progress Notes (Signed)
Subjective:    Patient ID: Stacy Reese, female    DOB: [birth year], 75 y.o.   MRN: [id number] Chief Complaint  Patient presents with   Follow-up    75yrabi     Stacy Ritchieis a 75year old female that returns to the office for followup and review of the noninvasive studies. There have been no interval changes in lower extremity symptoms. No interval shortening of the patient's claudication distance or development of rest pain symptoms. No new ulcers or wounds have occurred since the last visit.  There have been no significant changes to the patient's overall health care.  The patient denies amaurosis fugax or recent TIA symptoms. There are no recent neurological changes noted. The patient denies history of DVT, PE or superficial thrombophlebitis. The patient denies recent episodes of angina or shortness of breath.   ABI Rt=1.16 and Lt=1.16  (previous ABI's Rt=1.09 and Lt=1.08) Duplex ultrasound of the bilateral tibial arteries reveals triphasic waveforms with good toe waveforms bilaterally.   Review of Systems  Cardiovascular:        Denies claudication or rest pain  Skin:  Positive for wound.  All other systems reviewed and are negative.     Objective:   Physical Exam Vitals reviewed.  HENT:     Head: Normocephalic.  Cardiovascular:     Rate and Rhythm: Normal rate.     Pulses: Normal pulses.  Pulmonary:     Effort: Pulmonary effort is normal.  Skin:    General: Skin is warm and dry.  Neurological:     Mental Status: She is alert and oriented to person, place, and time.  Psychiatric:        Mood and Affect: Mood normal.        Behavior: Behavior normal.        Thought Content: Thought content normal.        Judgment: Judgment normal.    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 01/30/2010   R inf pretibial - ED&C    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 04/14/2016   L mid med pretibial    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 05/20/2016   L mid to distal med pretibial    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 07/20/2016   L mid med pretibial    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 05/10/2017   L mid pretibial    Personal history of chemotherapy    1 round   Personal history  of radiation therapy    Skin cancer    squamous cell   Squamous cell carcinoma of skin 03/24/2006   L sup pretibial    Squamous cell carcinoma of skin 03/24/2006   R pretibial inf    Squamous cell carcinoma of skin 05/25/2006   L lat mid lower leg    Squamous cell carcinoma of skin 06/29/2006   R pretibial lat    Squamous cell carcinoma of skin 10/17/2012   R prox pretibial ED&C   Squamous cell carcinoma of skin 12/19/2012   R prox sup pretibial    Squamous cell carcinoma of skin 02/03/2016   L med mid pretibial    Squamous cell carcinoma of skin 08/20/2016   L pretibial sup    Squamous cell carcinoma of skin 08/20/2016   L pretibial inf    Squamous cell carcinoma of skin 10/10/2018   L prox med pretibial    Squamous cell carcinoma of skin 06/29/2019   L calf     Social  History   Socioeconomic History   Marital status: Widowed    Spouse name: Not on file   Number of children: Not on file   Years of education: Not on file   Highest education level: Not on file  Occupational History   Not on file  Tobacco Use   Smoking status: Former   Smokeless tobacco: Never  Substance and Sexual Activity   Alcohol use: Yes    Comment: social   Drug use: No   Sexual activity: Not on file  Other Topics Concern   Not on file  Social History Narrative   Not on file   Social Determinants of Health   Financial Resource Strain: Not on file  Food Insecurity: Not on file  Transportation Needs: Not on file  Physical Activity: Not on file  Stress: Not on file  Social Connections: Not on file  Intimate Partner Violence: Not on file    Past Surgical History:  Procedure Laterality Date   BREAST BIOPSY Left 09/04/2013   negative stereo bx   BREAST BIOPSY Left 08/25/2013   postive [place] core bx   BREAST LUMPECTOMY Left 08/2017   BREAST LUMPECTOMY W/ NEEDLE LOCALIZATION Left 2015   ESOPHAGOGASTRODUODENOSCOPY (EGD) WITH PROPOFOL N/A 08/05/2018   Procedure: ESOPHAGOGASTRODUODENOSCOPY (EGD) WITH PROPOFOL;  Surgeon: Lucilla Lame, MD;  Location: [facility] END[facility]COPY;  Service: Endoscopy;  Laterality: N/A;   GANGLION CYST EXCISION     OPEN REDUCTION INTERNAL FIXATION (ORIF) DISTAL RADIAL FRACTURE Left 07/11/2019   Procedure: OPEN REDUCTION INTERNAL FIXATION (ORIF) DISTAL RADIAL FRACTURE;  Surgeon: Hessie Knows, MD;  Location: [facility] ORS;  Service: Orthopedics;  Laterality: Left;    Family History  Problem Relation Age of Onset   Cancer Father        colon   CAD Father    Hypertension Father    Hyperlipidemia Father    CAD Mother    Breast cancer Neg Hx     Allergies  Allergen Reactions   Metoprolol Shortness Of Breath   Other Other (See Comments)    Sodium pentothal  Causes blood clots    CBC Latest Ref Rng & Units 07/11/2019 08/08/2018 08/07/2018  WBC 4.0 - 10.5 K/uL  7.6 8.7 8.4  Hemoglobin 12.0 - 15.0 g/dL 14.5 11.9(L) 12.4  Hematocrit 36.0 - 46.0 % 41.9 37.2 38.0  Platelets 150 - 400 K/uL 236 187 161      CMP     Component Value Date/Time   NA  138 07/11/2019 0629   NA 132 (L) 11/16/2013 1011   K 3.4 (L) 07/11/2019 0629   K 3.3 (L) 11/16/2013 1011   CL 103 07/11/2019 0629   CL 92 (L) 11/16/2013 1011   CO2 24 07/11/2019 0629   CO2 25 11/16/2013 1011   GLUCOSE 147 (H) 07/11/2019 0629   GLUCOSE 168 (H) 11/16/2013 1011   BUN 13 07/11/2019 0629   BUN 14 11/16/2013 1011   CREATININE 0.78 07/11/2019 0629   CREATININE 1.19 11/16/2013 1011   CALCIUM 9.2 07/11/2019 0629   CALCIUM 9.0 11/16/2013 1011   PROT 7.3 08/05/2018 0559   PROT 7.6 11/16/2013 1011   ALBUMIN 3.9 08/05/2018 0559   ALBUMIN 3.1 (L) 11/16/2013 1011   AST 55 (H) 08/05/2018 0559   AST 34 11/16/2013 1011   ALT 77 (H) 08/05/2018 0559   ALT 42 11/16/2013 1011   ALKPHOS 84 08/05/2018 0559   ALKPHOS 80 11/16/2013 1011   BILITOT 1.1 08/05/2018 0559   BILITOT 1.0 11/16/2013 1011   GFRNONAA >60 07/11/2019 0629   GFRNONAA 48 (L) 11/16/2013 1011   GFRAA >60 07/11/2019 0629   GFRAA 55 (L) 11/16/2013 1011     No results found.     Assessment & Plan:   1. Atherosclerosis of native artery of left lower extremity with intermittent claudication (HCC)  Recommend:   The patient does not voice lifestyle limiting changes at this point in time.  Noninvasive studies do not suggest clinically significant change.  No invasive studies, angiography or surgery at this time The patient should continue walking and begin a more formal exercise program.  The patient should continue antiplatelet therapy and aggressive treatment of the lipid abnormalities  No changes in the patient's medications at this time  The patient should continue wearing graduated compression socks 10-15 mmHg strength to control the mild edema.    Follow-up in 1 year with noninvasive studies  2. HTN (hypertension),  benign Continue antihypertensive medications as already ordered, these medications have been reviewed and there are no changes at this time.   3. Hyperlipidemia, unspecified hyperlipidemia type Continue statin as ordered and reviewed, no changes at this time    Current Outpatient Medications on File Prior to Visit  Medication Sig Dispense Refill   alendronate (FOSAMAX) 70 MG tablet TAKE ONE TABLET BY MOUTH ONCE WEEKLY. TAKE WITH A FULL GLASS OF WATER ON AN EMPTY STOMACH 12 tablet 3   alum & mag hydroxide-simeth (MAALOX/MYLANTA) [id number] MG/5ML suspension Take 30 mLs by mouth every 6 (six) hours as needed for indigestion or heartburn. 355 mL 0   aspirin EC 81 MG tablet Take 81 mg by mouth daily.      busPIRone (BUSPAR) 10 MG tablet Take 10 mg by mouth daily.      citalopram (CELEXA) 20 MG tablet Take 20 mg by mouth daily.      diltiazem (TIAZAC) 240 MG 24 hr capsule Take 240 mg by mouth daily.      HYDROcodone-acetaminophen (NORCO) 5-325 MG tablet Take 1 tablet by mouth every 6 (six) hours as needed for moderate pain. 20 tablet 0   letrozole (FEMARA) 2.5 MG tablet TAKE ONE TABLET BY MOUTH DAILY 90 tablet 2   LORazepam (ATIVAN) 1 MG tablet Take 1 mg by mouth daily as needed for anxiety.      Multiple Vitamins-Minerals (MULTIVITAMIN WITH MINERALS) tablet Take 1 tablet by mouth daily. Alive     pravastatin (PRAVACHOL) 40 MG tablet Take 40 mg by mouth daily.  triamterene-hydrochlorothiazide (MAXZIDE-25) 37.5-25 MG per tablet Take 1 tablet by mouth daily.      warfarin (COUMADIN) 5 MG tablet Take 5 mg by mouth See admin instructions. Takes along with a '2mg'$  to equal '7mg'$  daily     pantoprazole (PROTONIX) 40 MG tablet Take 1 tablet (40 mg total) by mouth daily. 30 tablet 11   warfarin (COUMADIN) 2 MG tablet Take 2 mg by mouth See admin instructions. Take with 5 mg for a total of 7 mg     No current facility-administered medications on file prior to visit.    There are no Patient Instructions  on file for this visit. No follow-ups on file.   Kris Hartmann, NP

## 2021-05-01 ENCOUNTER — Other Ambulatory Visit: Payer: Self-pay | Admitting: Internal Medicine

## 2021-05-01 DIAGNOSIS — N6312 Unspecified lump in the right breast, upper inner quadrant: Secondary | ICD-10-CM

## 2021-05-03 ENCOUNTER — Other Ambulatory Visit: Payer: Self-pay

## 2021-05-03 ENCOUNTER — Emergency Department: Payer: Medicare HMO

## 2021-05-03 ENCOUNTER — Observation Stay
Admission: EM | Admit: 2021-05-03 | Discharge: 2021-05-04 | Disposition: A | Discharge status: Home / Self Care | Payer: Medicare HMO | Attending: Internal Medicine | Admitting: Internal Medicine

## 2021-05-03 ENCOUNTER — Encounter: Payer: Self-pay | Admitting: Emergency Medicine

## 2021-05-03 DIAGNOSIS — I110 Hypertensive heart disease with heart failure: Secondary | ICD-10-CM | POA: Diagnosis not present

## 2021-05-03 DIAGNOSIS — I480 Paroxysmal atrial fibrillation: Secondary | ICD-10-CM | POA: Diagnosis not present

## 2021-05-03 DIAGNOSIS — R5381 Other malaise: Secondary | ICD-10-CM | POA: Insufficient documentation

## 2021-05-03 DIAGNOSIS — E119 Type 2 diabetes mellitus without complications: Secondary | ICD-10-CM | POA: Diagnosis not present

## 2021-05-03 DIAGNOSIS — Z85828 Personal history of other malignant neoplasm of skin: Secondary | ICD-10-CM | POA: Diagnosis not present

## 2021-05-03 DIAGNOSIS — E785 Hyperlipidemia, unspecified: Secondary | ICD-10-CM | POA: Diagnosis not present

## 2021-05-03 DIAGNOSIS — R509 Fever, unspecified: Secondary | ICD-10-CM | POA: Insufficient documentation

## 2021-05-03 DIAGNOSIS — R0602 Shortness of breath: Secondary | ICD-10-CM | POA: Diagnosis present

## 2021-05-03 DIAGNOSIS — Z86711 Personal history of pulmonary embolism: Secondary | ICD-10-CM | POA: Insufficient documentation

## 2021-05-03 DIAGNOSIS — I10 Essential (primary) hypertension: Secondary | ICD-10-CM | POA: Diagnosis not present

## 2021-05-03 DIAGNOSIS — R519 Headache, unspecified: Secondary | ICD-10-CM | POA: Insufficient documentation

## 2021-05-03 DIAGNOSIS — Z79899 Other long term (current) drug therapy: Secondary | ICD-10-CM | POA: Insufficient documentation

## 2021-05-03 DIAGNOSIS — R0989 Other specified symptoms and signs involving the circulatory and respiratory systems: Secondary | ICD-10-CM | POA: Insufficient documentation

## 2021-05-03 DIAGNOSIS — Z20822 Contact with and (suspected) exposure to covid-19: Secondary | ICD-10-CM | POA: Insufficient documentation

## 2021-05-03 DIAGNOSIS — Z86718 Personal history of other venous thrombosis and embolism: Secondary | ICD-10-CM | POA: Insufficient documentation

## 2021-05-03 DIAGNOSIS — I509 Heart failure, unspecified: Secondary | ICD-10-CM | POA: Diagnosis not present

## 2021-05-03 DIAGNOSIS — Z853 Personal history of malignant neoplasm of breast: Secondary | ICD-10-CM | POA: Insufficient documentation

## 2021-05-03 DIAGNOSIS — Z87891 Personal history of nicotine dependence: Secondary | ICD-10-CM | POA: Insufficient documentation

## 2021-05-03 DIAGNOSIS — Z7901 Long term (current) use of anticoagulants: Secondary | ICD-10-CM | POA: Insufficient documentation

## 2021-05-03 DIAGNOSIS — Z7982 Long term (current) use of aspirin: Secondary | ICD-10-CM | POA: Diagnosis not present

## 2021-05-03 DIAGNOSIS — I5031 Acute diastolic (congestive) heart failure: Secondary | ICD-10-CM | POA: Diagnosis not present

## 2021-05-03 DIAGNOSIS — R197 Diarrhea, unspecified: Secondary | ICD-10-CM | POA: Insufficient documentation

## 2021-05-03 DIAGNOSIS — I4891 Unspecified atrial fibrillation: Secondary | ICD-10-CM

## 2021-05-03 LAB — COMPREHENSIVE METABOLIC PANEL
ALT: 15 U/L (ref 0–44)
AST: 18 U/L (ref 15–41)
Albumin: 4.1 g/dL (ref 3.5–5.0)
Alkaline Phosphatase: 65 U/L (ref 38–126)
Anion gap: 8 (ref 5–15)
BUN: 9 mg/dL (ref 8–23)
CO2: 24 mmol/L (ref 22–32)
Calcium: 8.6 mg/dL — ABNORMAL LOW (ref 8.9–10.3)
Chloride: 102 mmol/L (ref 98–111)
Creatinine, Ser: 0.65 mg/dL (ref 0.44–1.00)
GFR, Estimated: >60 mL/min (ref >60)
Glucose, Bld: 139 mg/dL — ABNORMAL HIGH (ref 70–99)
Potassium: 3.7 mmol/L (ref 3.5–5.1)
Sodium: 134 mmol/L — ABNORMAL LOW (ref 135–145)
Total Bilirubin: 1.8 mg/dL — ABNORMAL HIGH (ref 0.3–1.2)
Total Protein: 8.2 g/dL — ABNORMAL HIGH (ref 6.5–8.1)

## 2021-05-03 LAB — URINALYSIS, COMPLETE (UACMP) WITH MICROSCOPIC
Bilirubin Urine: NEGATIVE
Bilirubin Urine: NEGATIVE
Glucose, UA: NEGATIVE mg/dL
Glucose, UA: NEGATIVE mg/dL
Hgb urine dipstick: NEGATIVE
Hgb urine dipstick: NEGATIVE
Ketones, ur: 20 mg/dL — AB
Ketones, ur: 20 mg/dL — AB
Leukocytes,Ua: NEGATIVE
Nitrite: NEGATIVE
Nitrite: NEGATIVE
Protein, ur: 100 mg/dL — AB
Protein, ur: 100 mg/dL — AB
Specific Gravity, Urine: 1.023 (ref 1.005–1.030)
Specific Gravity, Urine: 1.021 (ref 1.005–1.030)
pH: 7 (ref 5.0–8.0)
pH: 7 (ref 5.0–8.0)

## 2021-05-03 LAB — GLUCOSE, CAPILLARY: Glucose-Capillary: 128 mg/dL — ABNORMAL HIGH (ref 70–99)

## 2021-05-03 LAB — CBC WITH DIFFERENTIAL/PLATELET
Abs Immature Granulocytes: 0.05 10*3/uL (ref 0.00–0.07)
Basophils Absolute: 0 10*3/uL (ref 0.0–0.1)
Basophils Relative: 0 %
Eosinophils Absolute: 0 10*3/uL (ref 0.0–0.5)
Eosinophils Relative: 0 %
HCT: 43.2 % (ref 36.0–46.0)
Hemoglobin: 14.1 g/dL (ref 12.0–15.0)
Immature Granulocytes: 1 %
Lymphocytes Relative: 7 %
Lymphs Abs: 0.6 10*3/uL — ABNORMAL LOW (ref 0.7–4.0)
MCH: 27.8 pg (ref 26.0–34.0)
MCHC: 32.6 g/dL (ref 30.0–36.0)
MCV: 85 fL (ref 80.0–100.0)
Monocytes Absolute: 0.7 10*3/uL (ref 0.1–1.0)
Monocytes Relative: 8 %
Neutro Abs: 7.2 10*3/uL (ref 1.7–7.7)
Neutrophils Relative %: 84 %
Platelets: 193 10*3/uL (ref 150–400)
RBC: 5.08 MIL/uL (ref 3.87–5.11)
RDW: 13.6 % (ref 11.5–15.5)
WBC: 8.6 10*3/uL (ref 4.0–10.5)
nRBC: 0 % (ref 0.0–0.2)

## 2021-05-03 LAB — BRAIN NATRIURETIC PEPTIDE: B Natriuretic Peptide: 369.5 pg/mL — ABNORMAL HIGH (ref 0.0–100.0)

## 2021-05-03 LAB — RESP PANEL BY RT-PCR (FLU A&B, COVID) ARPGX2
Influenza A by PCR: NEGATIVE
Influenza B by PCR: NEGATIVE
SARS Coronavirus 2 by RT PCR: NEGATIVE

## 2021-05-03 LAB — TROPONIN I (HIGH SENSITIVITY)
Troponin I (High Sensitivity): 9 ng/L (ref <18)
Troponin I (High Sensitivity): 9 ng/L (ref <18)

## 2021-05-03 LAB — PROTIME-INR
INR: 1.6 — ABNORMAL HIGH (ref 0.8–1.2)
Prothrombin Time: 19.2 seconds — ABNORMAL HIGH (ref 11.4–15.2)

## 2021-05-03 LAB — PROCALCITONIN: Procalcitonin: <0.1 ng/mL

## 2021-05-03 LAB — MAGNESIUM: Magnesium: 1.9 mg/dL (ref 1.7–2.4)

## 2021-05-03 IMAGING — CT CT ANGIO CHEST
2 of 7 series · 17 of 46 positions shown · IV contrast (APPLIED)
Comparison: Comparison is made with chest x-ray of the same date.

CLINICAL DATA: Pulmonary embolism suspected in a 75-year-old
female.

EXAM:
CT ANGIOGRAPHY CHEST WITH CONTRAST
TECHNIQUE: Multidetector CT imaging of the chest was performed using the
standard protocol during bolus administration of intravenous
contrast. Multiplanar CT image reconstructions and MIPs were
obtained to evaluate the vascular anatomy.

[Series 8: thins · axial · 0.67mm/px · z∈[-856,-582]mm · 14 of 380 slices shown]
[im 19/380  lung]
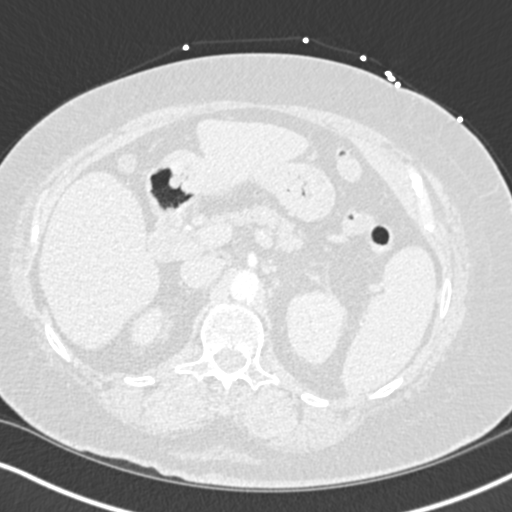
[im 57/380  soft-tissue]
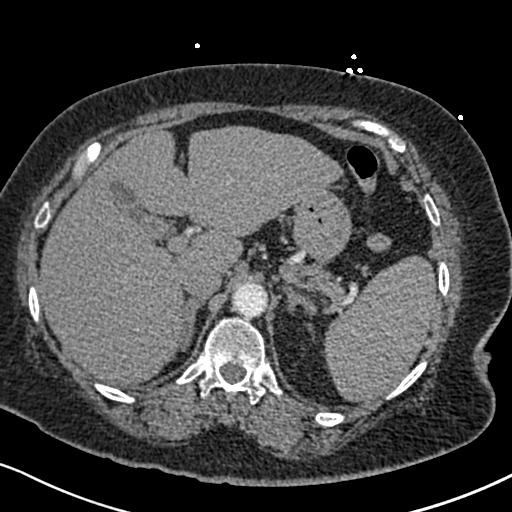
[im 76/380  lung]
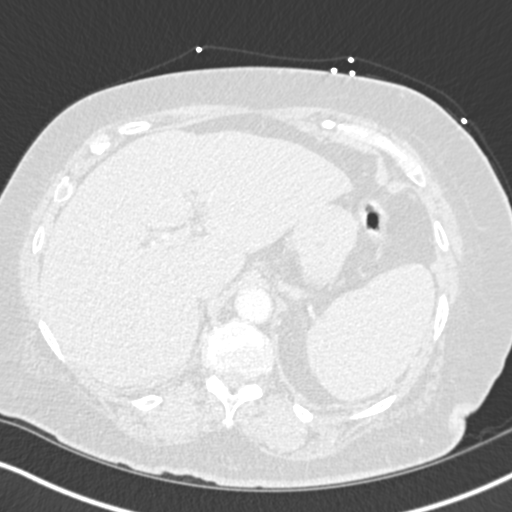
[im 95/380  soft-tissue]
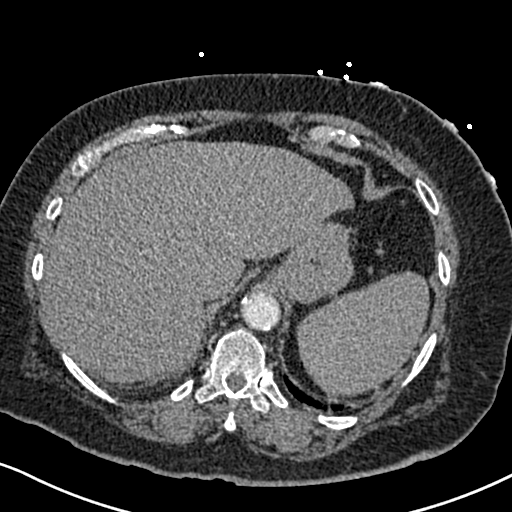
[im 133/380  lung]
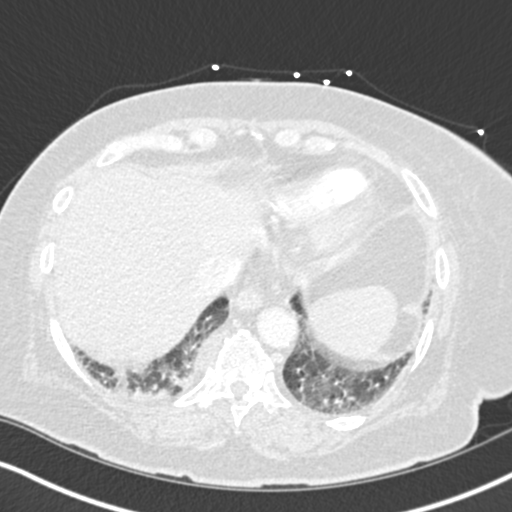
[im 152/380  soft-tissue]
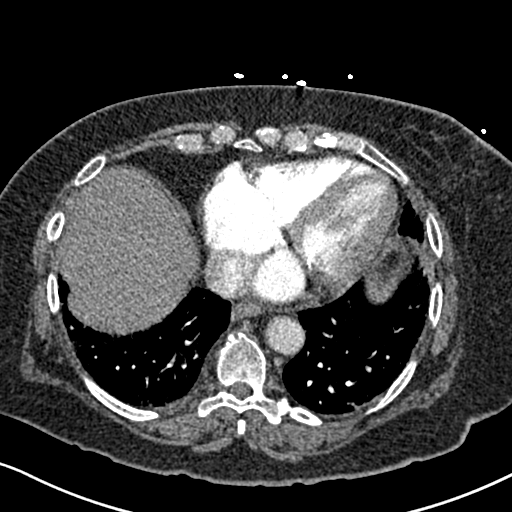
[im 171/380  lung]
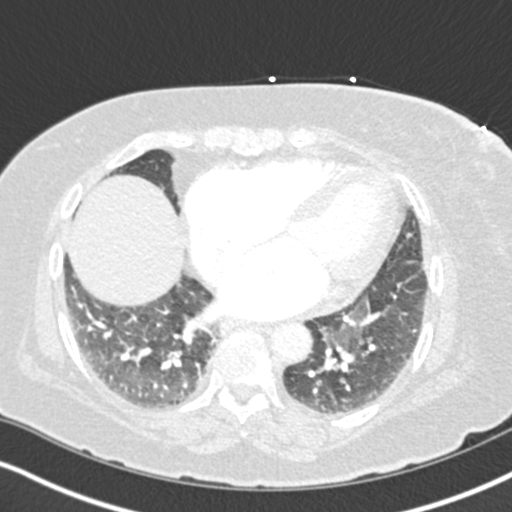
[im 209/380  soft-tissue]
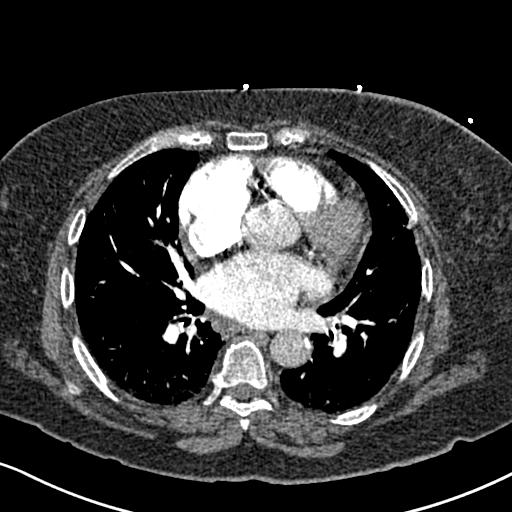
[im 228/380  lung]
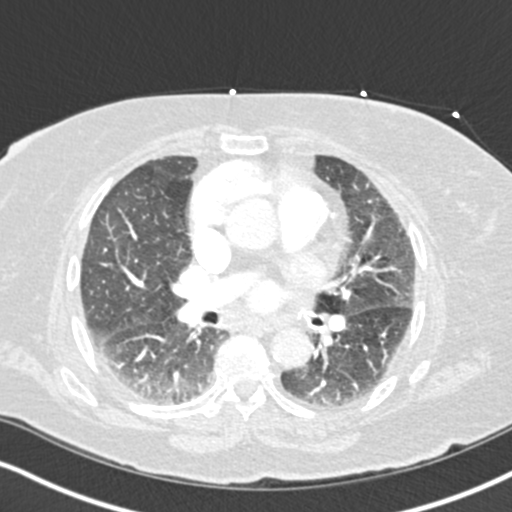
[im 247/380  soft-tissue]
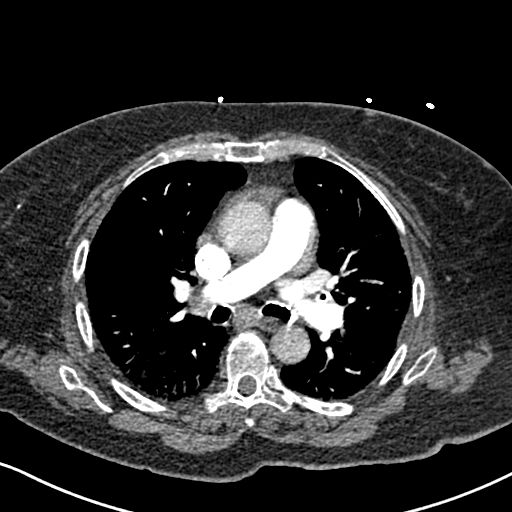
[im 285/380  lung]
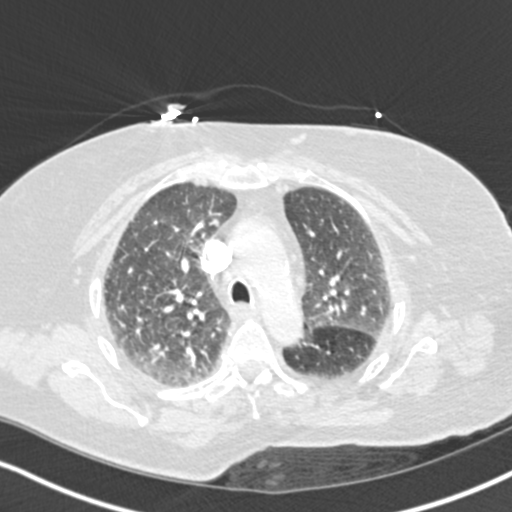
[im 304/380  soft-tissue]
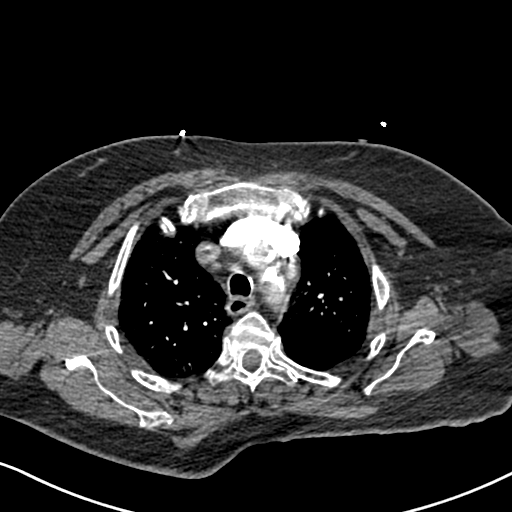
[im 323/380  lung]
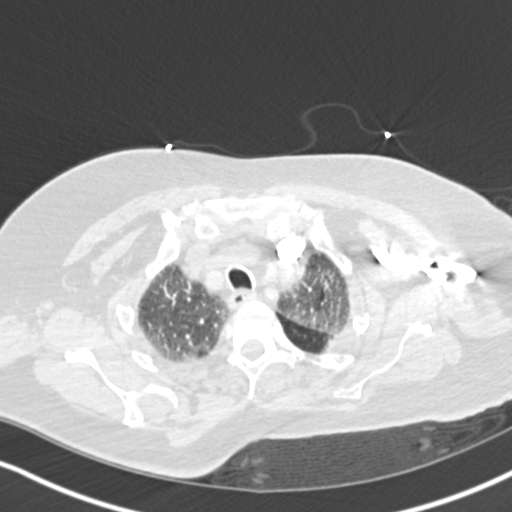
[im 361/380  soft-tissue]
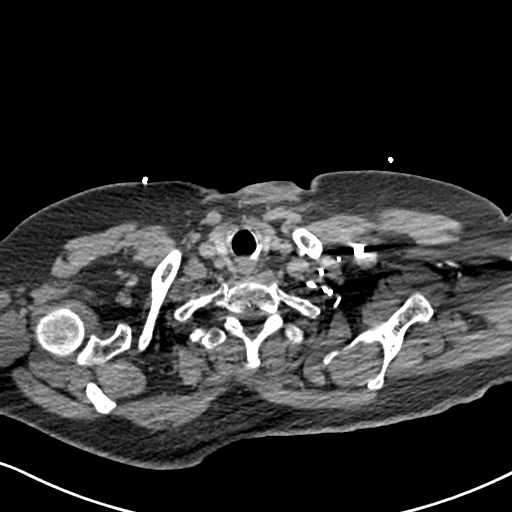

[Series 10: coronal mpr · coronal · 0.59mm/px · 3 of 102 slices shown]
[im 26/102  soft-tissue]
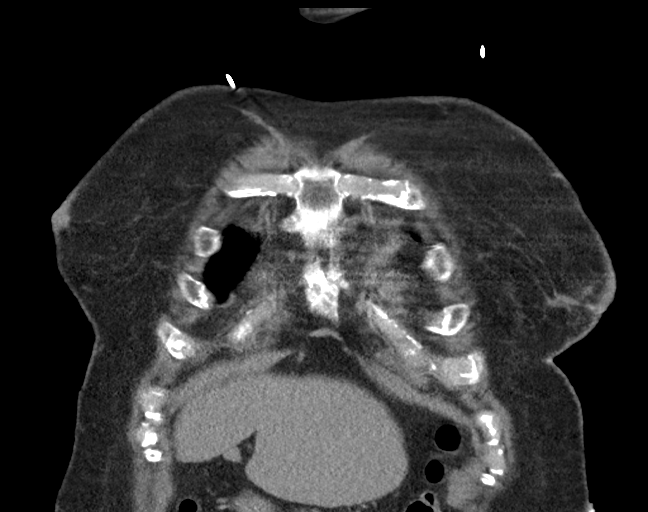
[im 51/102  soft-tissue]
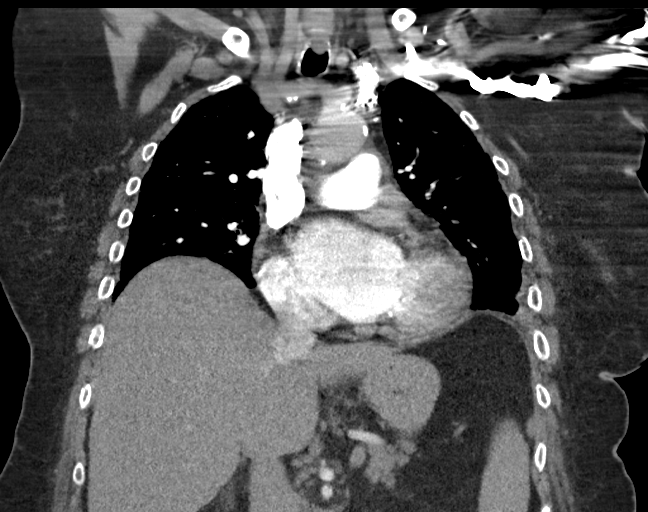
[im 76/102  soft-tissue]
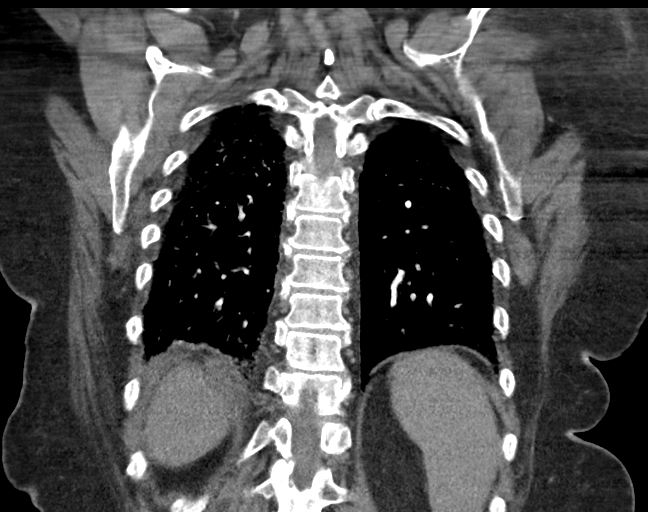

[17 of 46 positions shown; findings below may reference images not displayed]

RADIATION DOSE REDUCTION: This exam was performed according to the
departmental dose-optimization program which includes automated
exposure control, adjustment of the mA and/or kV according to
patient size and/or use of iterative reconstruction technique.

CONTRAST:  75mL OMNIPAQUE IOHEXOL 350 MG/ML SOLN
FINDINGS: Cardiovascular: Calcified and noncalcified atheromatous plaque of
the thoracic aorta without aneurysmal dilation. Normal caliber of
central pulmonary vessels.

Heart size moderately enlarged.

Central pulmonary arteries are well opacified with a maximal
attenuation of 381 Hounsfield units. Exam mildly limited by motion
related artifact. Negative for pulmonary embolism.

Mediastinum/Nodes: Esophagus grossly normal. No thoracic inlet
lymphadenopathy. No mediastinal lymphadenopathy. No hilar
lymphadenopathy. No axillary lymphadenopathy.

Lungs/Pleura: Basilar atelectasis. No effusion. No dense
consolidation. Mild mosaic attenuation at the lung bases.

LEFT upper lobe pulmonary nodule (image 44/9) this measures
approximately 5 mm potentially bandlike but with motion artifact
this shows limited assessment. The airways are patent into the
chest.

Upper Abdomen: Incidental imaging of upper abdominal contents shows
no acute process. Lobular hepatic contours and signs of mild to
moderate splenomegaly.

Musculoskeletal: No acute musculoskeletal process. Spinal
degenerative changes. A 2.5 cm area adjacent to the T10 vertebral
body along the RIGHT paraspinal region showing no adjacent bony
changes was present in [DATE] and is slightly larger previously
measuring 2 cm. This shows low-density.

Review of the MIP images confirms the above findings.
IMPRESSION: 1. Negative for pulmonary embolism.
2. LEFT upper lobe pulmonary nodule measures approximately 5 mm
potentially bandlike but with motion artifact this shows limited
assessment. No follow-up needed if patient is low-risk. Non-contrast
chest CT can be considered in 12 months if patient is high-risk.
This recommendation follows the consensus statement: Guidelines for
Management of Incidental Pulmonary Nodules Detected on CT Images:
3. Low-attenuation along the RIGHT paraspinal region is of uncertain
significance potentially related to lymphatic malformation or even
atypical location for thoracic duct. Consider follow-up spinal MRI
however in this patient with history of breast cancer for definitive
assessment assessment. Behavior is in keeping with an indolent or
benign process.
4. Mild mosaic attenuation at the lung bases, nonspecific, can be
seen in the setting of small airways disease.
5. Lobular hepatic contours with mildly nodular surface of the liver
and with mild splenomegaly, correlate with signs of liver disease.
6. Aortic atherosclerosis.

Aortic Atherosclerosis ([77]-[77]).

## 2021-05-03 IMAGING — CR DG CHEST 2V
2 series · 2 of 2 positions shown · non-contrast
Comparison: [DATE].

CLINICAL DATA: Cough, fever and sore throat which began yesterday.
Wheezing upon arrival.

EXAM:
CHEST - 2 VIEW

[chest pa]
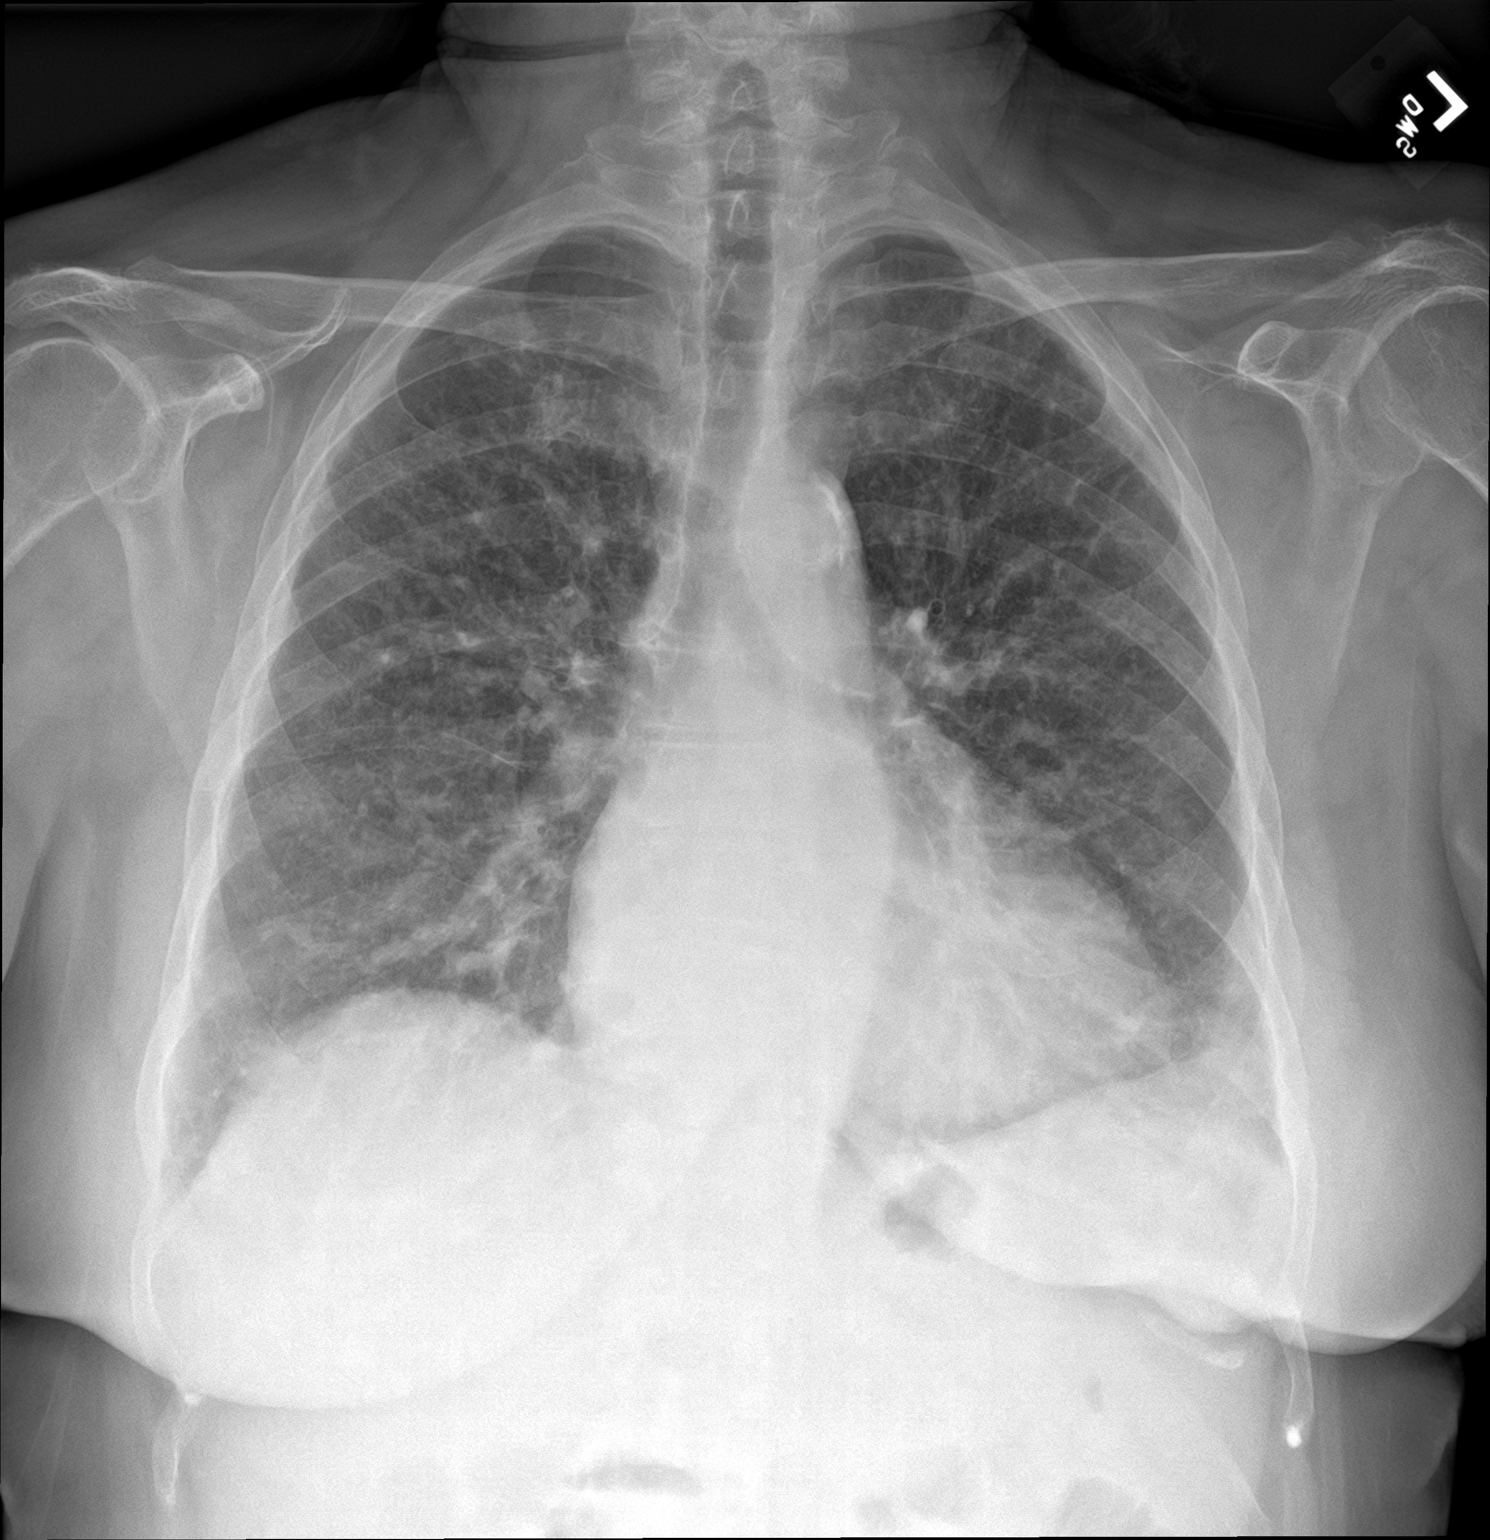

[chest lat]
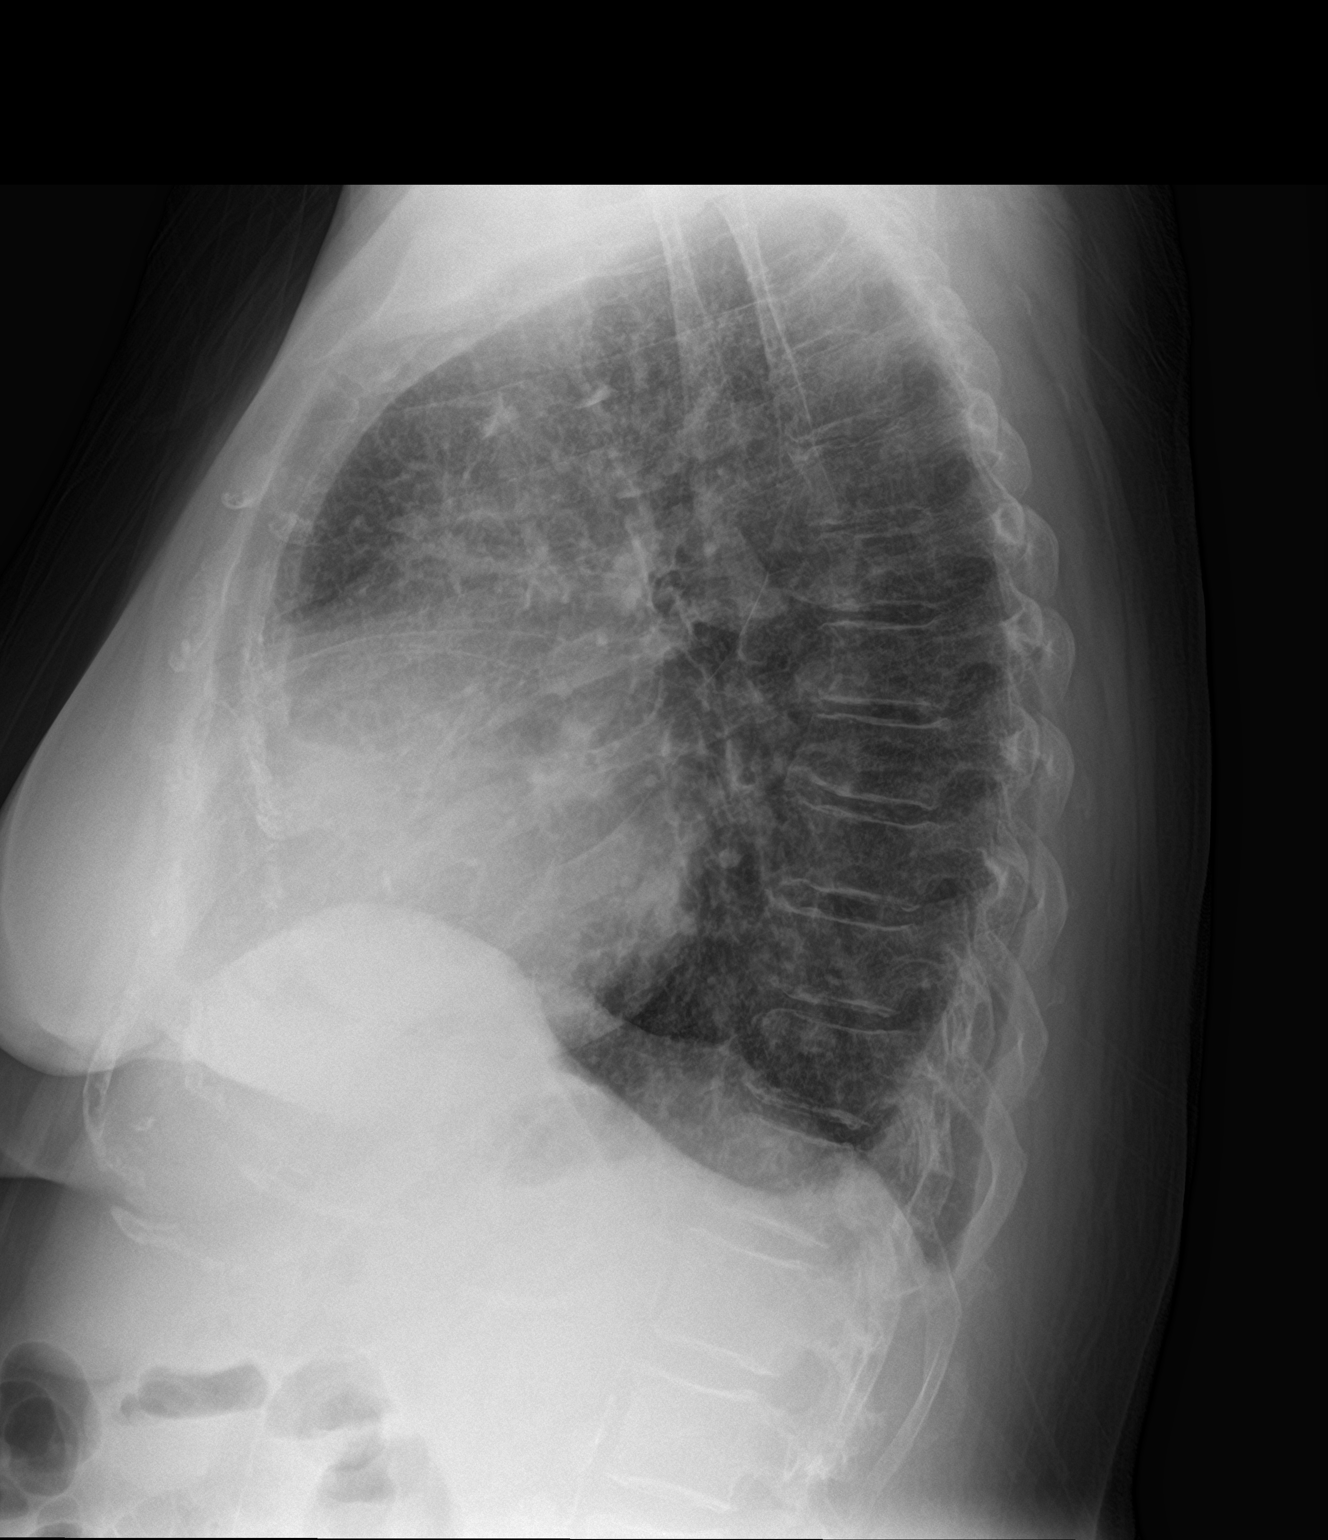

[2 of 2 positions shown; findings below may reference images not displayed]

FINDINGS: Heart size normal. Aortic atherosclerosis. Blunting of the left
costophrenic angle may reflect chronic pleuroparenchymal scarring
versus small effusion. Diffuse pulmonary vascular congestion. No
airspace consolidation. Visualized osseous structures appear intact.
IMPRESSION: Pulmonary vascular congestion.

## 2021-05-03 MED ORDER — ADULT MULTIVITAMIN W/MINERALS CH
1.0000 | ORAL_TABLET | Freq: Every day | ORAL | Status: DC
  Administered 2021-05-04: 1 via ORAL
  Filled 2021-05-03: qty 1

## 2021-05-03 MED ORDER — PRAVASTATIN SODIUM 20 MG PO TABS: 40.0000 mg | ORAL_TABLET | Freq: Every day | ORAL | Status: DC

## 2021-05-03 MED ORDER — LORAZEPAM 1 MG PO TABS: 1.0000 mg | ORAL_TABLET | Freq: Every day | ORAL | Status: DC | PRN

## 2021-05-03 MED ORDER — METOPROLOL TARTRATE 25 MG PO TABS
25.0000 mg | ORAL_TABLET | Freq: Once | ORAL | Status: AC
  Administered 2021-05-03: 25 mg via ORAL
  Filled 2021-05-03: qty 1

## 2021-05-03 MED ORDER — ALUM & MAG HYDROXIDE-SIMETH 200-200-20 MG/5ML PO SUSP: 30.0000 mL | Freq: Four times a day (QID) | ORAL | Status: DC | PRN

## 2021-05-03 MED ORDER — ONDANSETRON HCL 4 MG/2ML IJ SOLN: 4.0000 mg | Freq: Four times a day (QID) | INTRAMUSCULAR | Status: DC | PRN

## 2021-05-03 MED ORDER — LETROZOLE 2.5 MG PO TABS: 2.5000 mg | ORAL_TABLET | Freq: Every day | ORAL | Status: DC

## 2021-05-03 MED ORDER — POTASSIUM CHLORIDE 20 MEQ PO PACK
40.0000 meq | PACK | Freq: Once | ORAL | Status: AC
  Administered 2021-05-03: 40 meq via ORAL
  Filled 2021-05-03: qty 2

## 2021-05-03 MED ORDER — ASPIRIN EC 81 MG PO TBEC
81.0000 mg | DELAYED_RELEASE_TABLET | Freq: Every day | ORAL | Status: DC
Start: 2021-05-04 — End: 2021-05-04
  Administered 2021-05-04: 81 mg via ORAL
  Filled 2021-05-03: qty 1

## 2021-05-03 MED ORDER — HYDROCODONE-ACETAMINOPHEN 5-325 MG PO TABS: 1.0000 | ORAL_TABLET | Freq: Four times a day (QID) | ORAL | Status: DC | PRN

## 2021-05-03 MED ORDER — DILTIAZEM HCL ER COATED BEADS 240 MG PO CP24
240.0000 mg | ORAL_CAPSULE | Freq: Every day | ORAL | Status: DC
  Administered 2021-05-04: 240 mg via ORAL
  Filled 2021-05-03: qty 1

## 2021-05-03 MED ORDER — TRIAMTERENE-HCTZ 37.5-25 MG PO TABS
1.0000 | ORAL_TABLET | Freq: Every day | ORAL | Status: DC
  Administered 2021-05-04: 1 via ORAL
  Filled 2021-05-03: qty 1

## 2021-05-03 MED ORDER — MAGNESIUM HYDROXIDE 400 MG/5ML PO SUSP: 30.0000 mL | Freq: Every day | ORAL | Status: DC | PRN

## 2021-05-03 MED ORDER — ALBUTEROL SULFATE (2.5 MG/3ML) 0.083% IN NEBU
2.5000 mg | INHALATION_SOLUTION | Freq: Once | RESPIRATORY_TRACT | Status: AC
  Administered 2021-05-03: 2.5 mg via RESPIRATORY_TRACT
  Filled 2021-05-03: qty 3

## 2021-05-03 MED ORDER — ACETAMINOPHEN 325 MG PO TABS: 650.0000 mg | ORAL_TABLET | Freq: Four times a day (QID) | ORAL | Status: DC | PRN

## 2021-05-03 MED ORDER — FUROSEMIDE 10 MG/ML IJ SOLN
40.0000 mg | Freq: Once | INTRAMUSCULAR | Status: AC
  Administered 2021-05-03: 40 mg via INTRAVENOUS
  Filled 2021-05-03: qty 4

## 2021-05-03 MED ORDER — FUROSEMIDE 10 MG/ML IJ SOLN
40.0000 mg | Freq: Two times a day (BID) | INTRAMUSCULAR | Status: DC
  Administered 2021-05-04: 40 mg via INTRAVENOUS
  Filled 2021-05-03: qty 4

## 2021-05-03 MED ORDER — ACETAMINOPHEN 325 MG PO TABS
650.0000 mg | ORAL_TABLET | Freq: Once | ORAL | Status: AC
  Administered 2021-05-03: 650 mg via ORAL
  Filled 2021-05-03: qty 2

## 2021-05-03 MED ORDER — BUSPIRONE HCL 5 MG PO TABS
10.0000 mg | ORAL_TABLET | Freq: Every day | ORAL | Status: DC
  Administered 2021-05-04: 10 mg via ORAL
  Filled 2021-05-03: qty 2

## 2021-05-03 MED ORDER — ACETAMINOPHEN 650 MG RE SUPP: 650.0000 mg | Freq: Four times a day (QID) | RECTAL | Status: DC | PRN

## 2021-05-03 MED ORDER — IOHEXOL 350 MG/ML SOLN
75.0000 mL | Freq: Once | INTRAVENOUS | Status: AC | PRN
  Administered 2021-05-03: 75 mL via INTRAVENOUS
  Filled 2021-05-03: qty 75

## 2021-05-03 MED ORDER — ONDANSETRON HCL 4 MG PO TABS
4.0000 mg | ORAL_TABLET | Freq: Four times a day (QID) | ORAL | Status: DC | PRN
  Administered 2021-05-03: 4 mg via ORAL
  Filled 2021-05-03: qty 1

## 2021-05-03 MED ORDER — PANTOPRAZOLE SODIUM 40 MG PO TBEC
40.0000 mg | DELAYED_RELEASE_TABLET | Freq: Every day | ORAL | Status: DC
Start: 2021-05-04 — End: 2021-05-04
  Administered 2021-05-04: 40 mg via ORAL
  Filled 2021-05-03: qty 1

## 2021-05-03 MED ORDER — CITALOPRAM HYDROBROMIDE 20 MG PO TABS
20.0000 mg | ORAL_TABLET | Freq: Every day | ORAL | Status: DC
  Administered 2021-05-04: 20 mg via ORAL
  Filled 2021-05-03: qty 1

## 2021-05-03 MED ORDER — TRAZODONE HCL 50 MG PO TABS: 25.0000 mg | ORAL_TABLET | Freq: Every evening | ORAL | Status: DC | PRN

## 2021-05-03 MED ORDER — METOPROLOL TARTRATE 5 MG/5ML IV SOLN
5.0000 mg | INTRAVENOUS | Status: DC | PRN
  Administered 2021-05-03 (×2): 5 mg via INTRAVENOUS
  Filled 2021-05-03 (×2): qty 5

## 2021-05-03 NOTE — ED Notes (Signed)
ED Provider at bedside. 

## 2021-05-03 NOTE — H&P (Addendum)
[place]   PATIENT NAME: Stacy Reese    MR#:  [id number]  DATE OF BIRTH:  [birth year]  DATE OF ADMISSION:  05/03/2021  PRIMARY CARE PHYSICIAN: Kirk Ruths, MD   Patient is coming from: Home  REQUESTING/REFERRING PHYSICIAN: Lannie Fields, PA-C  CHIEF COMPLAINT:   Chief Complaint  Patient presents with   Cough    HISTORY OF PRESENT ILLNESS:  Stacy Reese is a 76 y.o. Caucasian female with medical history significant for chronic atrial fibrillation, on Coumadin, type 2 diabetes mellitus, PVD, hypertension, dyslipidemia, PE and DVT, who presented to emergency room with acute onset of generalized malaise, and headache with associated dry cough for the last couple of days with associated dyspnea worsening on exertion.  She has been having paroxysmal nocturnal dyspnea and two-pillow orthopnea with no worsening lower extremity edema.  Today she experienced palpitations without chest pain.  No nausea or vomiting or abdominal pain.    She admits to diarrhea with no melena or bright red bleeding per rectum.  No chest pain or palpitations.  No dysuria, oliguria, hematuria, urgency or frequency or flank pain.  She had 2 COVID-19 vaccines and and 3 boosters.  No fever or chills.  No loss of taste or smell.  ED Course: Upon presentation to the emergency room, BP was 162/95 with temperature of 100 with otherwise normal vital signs.  Later heart rate was 120 and respiratory rate was 25.  Labs revealed a sodium of 134 and total bili of 8.2 with total bili 1.8.  BNP was 369.5.  High-sensitivity troponin I was 9 and later the same.  Procalcitonin was less than 0.1.  CBC was within normal.  INR was 1.6 and PT 19.2.  EKG as reviewed by me : EKG showed atrial fibrillation with rapid ventricular response of 123 with low voltage QRS and poor R wave progression. Imaging: Chest x-ray showed pulmonary vascular congestion. Chest CTA: 1. Negative for pulmonary embolism. 2. LEFT upper lobe  pulmonary nodule measures approximately 5 mm potentially bandlike but with motion artifact this shows limited assessment. No follow-up needed if patient is low-risk. Non-contrast chest CT can be considered in 12 months if patient is high-risk. This recommendation follows the consensus statement: Guidelines for Management of Incidental Pulmonary Nodules Detected on CT Images: From the Fleischner Society 2017; Radiology 2017; 284:228-243. 3. Low-attenuation along the RIGHT paraspinal region is of uncertain significance potentially related to lymphatic malformation or even atypical location for thoracic duct. Consider follow-up spinal MRI however in this patient with history of breast cancer for definitive assessment assessment. Behavior is in keeping with an indolent or benign process. 4. Mild mosaic attenuation at the lung bases, nonspecific, can be seen in the setting of small airways disease. 5. Lobular hepatic contours with mildly nodular surface of the liver and with mild splenomegaly, correlate with signs of liver disease. 6. Aortic atherosclerosi  The patient was given 650 mg p.o. Tylenol, nebulized albuterol, 40 mg of IV Lasix and 5 mg of IV Lopressor as well as 25 mg p.o. Lopressor.  Heart rate was down to 98.  She will be admitted to an observation cardiac telemetry bed for further evaluation and management. 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 01/30/2010   R inf pretibial - ED&C    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 04/14/2016   L mid med pretibial    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 05/20/2016   L mid to distal med pretibial    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 07/20/2016   L mid med pretibial    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 05/10/2017   L mid pretibial    Personal history of chemotherapy    1 round   Personal history of radiation therapy    Skin cancer    squamous cell   Squamous cell carcinoma of skin 03/24/2006   L sup pretibial    Squamous cell carcinoma of skin 03/24/2006   R pretibial inf    Squamous cell carcinoma of skin 05/25/2006   L lat mid lower leg    Squamous cell carcinoma of skin 06/29/2006   R pretibial lat    Squamous cell carcinoma of skin 10/17/2012   R prox pretibial ED&C   Squamous cell carcinoma of skin 12/19/2012   R prox sup pretibial    Squamous cell carcinoma of skin 02/03/2016   L med mid pretibial    Squamous cell carcinoma of skin 08/20/2016   L pretibial sup    Squamous cell carcinoma of skin 08/20/2016   L pretibial inf    Squamous cell carcinoma of skin 10/10/2018   L prox med pretibial    Squamous cell carcinoma of skin 06/29/2019   L calf    A-fib (CMS-HCC) 08/17/2013   Colorectal polyps   Depression   DM II (diabetes mellitus, type II), controlled (CMS-HCC) 08/17/2013   History of DVT (deep vein thrombosis)  2 dvt's and pulm emboli also in 2015   Hypercholesterolemia   Hypertension   Menopause   Obesity   Pulmonary embolism, bilateral (CMS-HCC)   Tobacco abuse    PAST SURGICAL HISTORY:   Past Surgical History:  Procedure Laterality Date   BREAST  BIOPSY Left 09/04/2013   negative stereo bx   BREAST BIOPSY Left 08/25/2013   postive [place] core bx   BREAST LUMPECTOMY Left 08/2017   BREAST LUMPECTOMY W/ NEEDLE LOCALIZATION Left 2015   ESOPHAGOGASTRODUODENOSCOPY (EGD) WITH PROPOFOL N/A 08/05/2018   Procedure: ESOPHAGOGASTRODUODENOSCOPY (EGD) WITH PROPOFOL;  Surgeon: Lucilla Lame, MD;  Location: [facility] ENDOSCOPY;  Service: Endoscopy;  Laterality: N/A;   GANGLION CYST EXCISION     OPEN REDUCTION INTERNAL FIXATION (ORIF) DISTAL RADIAL FRACTURE Left 07/11/2019   Procedure: OPEN REDUCTION INTERNAL FIXATION (ORIF) DISTAL RADIAL FRACTURE;  Surgeon: Hessie Knows, MD;  Location: [facility] ORS;  Service: Orthopedics;  Laterality: Left;   SOCIAL HISTORY:   Social History   Tobacco Use   Smoking status: Former   Smokeless tobacco: Never  Substance Use Topics   Alcohol use: Yes    Comment: social    FAMILY HISTORY:  Family History  Problem Relation Age of Onset   Cancer Father        colon   CAD Father    Hypertension Father    Hyperlipidemia Father    CAD Mother    Breast cancer Neg Hx     DRUG ALLERGIES:   Allergies  Allergen Reactions   Metoprolol Shortness Of Breath   Other Other (See Comments)    Sodium pentothal  Causes blood clots    REVIEW OF SYSTEMS:   ROS As per history of present illness. All pertinent systems were reviewed above. Constitutional, HEENT, cardiovascular, respiratory, GI, GU, musculoskeletal, neuro, psychiatric, endocrine, integumentary and hematologic systems were reviewed and are otherwise negative/unremarkable except for positive findings mentioned above in the HPI.   MEDICATIONS AT HOME:   Prior to Admission medications   Medication Sig Start Date End Date Taking? Authorizing Provider  alendronate (FOSAMAX) 70 MG tablet TAKE ONE TABLET BY MOUTH ONCE WEEKLY. TAKE WITH A FULL GLASS OF WATER ON AN EMPTY STOMACH 08/28/20   Lloyd Huger, MD  alum & mag hydroxide-simeth (MAALOX/MYLANTA) 200-200-20 MG/5ML  suspension Take 30 mLs by mouth every 6 (six) hours as needed for indigestion or heartburn. 08/08/18   Dustin Flock, MD  aspirin EC 81 MG tablet Take 81 mg by mouth daily.     [provider]  busPIRone (BUSPAR) 10 MG tablet Take 10 mg by mouth daily.     [provider]  citalopram (CELEXA) 20 MG tablet Take 20 mg by mouth daily.     [provider]  diltiazem (TIAZAC) 240 MG 24 hr capsule Take 240 mg by mouth daily.  07/13/14   [provider]  HYDROcodone-acetaminophen (NORCO) 5-325 MG tablet Take 1 tablet by mouth every 6 (six) hours as needed for moderate pain. 07/11/19   Hessie Knows, MD  letrozole [facility]) 2.5 MG tablet TAKE ONE TABLET BY MOUTH DAILY 09/28/19   Lloyd Huger, MD  LORazepam (ATIVAN) 1 MG tablet Take 1 mg by mouth daily as needed for anxiety.     [provider]  Multiple Vitamins-Minerals (MULTIVITAMIN WITH MINERALS) tablet Take 1 tablet by mouth daily. Alive    [provider]  pantoprazole (PROTONIX) 40 MG tablet Take 1 tablet (40 mg total) by mouth daily. 08/08/18 08/08/19  Dustin Flock, MD  pravastatin (PRAVACHOL) 40 MG tablet Take 40 mg by mouth daily.     [provider]  triamterene-hydrochlorothiazide (MAXZIDE-25) 37.5-25 MG per tablet Take 1 tablet by mouth daily.     [provider]  warfarin (COUMADIN) 2 MG tablet Take 2 mg by mouth See admin instructions. Take with 5 mg for a total of 7 mg 12/08/17 07/07/19  [provider]  warfarin (COUMADIN) 5 MG tablet Take 5 mg by mouth See admin instructions. Takes along with a 2mg  to equal 7mg  daily    [provider]      VITAL SIGNS:  Blood pressure 126/78, pulse 98, temperature 99.3 F (37.4 C), temperature source Oral, resp. rate (!) 30, height 5\' 6"  (1.676 m), weight 92.1 kg, SpO2 95 %.  PHYSICAL EXAMINATION:  Physical Exam  GENERAL:  76 y.o.-year-old Caucasian female patient lying in the bed with no acute distress.   EYES: Pupils equal, round, reactive to light and accommodation. No scleral icterus. Extraocular muscles intact.  HEENT: Head atraumatic, normocephalic. Oropharynx and nasopharynx clear.  NECK:  Supple, no jugular venous distention. No thyroid enlargement, no tenderness.  LUNGS: Diminished bibasilar breath  sounds with bibasal rales.  No use of accessory muscles of respiration.  CARDIOVASCULAR: Regular rate and rhythm, S1, S2 normal. No murmurs, rubs, or gallops.  ABDOMEN: Soft, nondistended, nontender. Bowel sounds present. No organomegaly or mass.  EXTREMITIES: Trace bilateral lower extremity pitting edema with no cyanosis, or clubbing.  NEUROLOGIC: Cranial nerves II through XII are intact. Muscle strength 5/5 in all extremities. Sensation intact. Gait not checked.  PSYCHIATRIC: The patient is alert and oriented x 3.  Normal affect and good eye contact. SKIN: No obvious rash, lesion, or ulcer.   LABORATORY PANEL:   CBC Recent Labs  Lab 05/03/21 1521  WBC 8.6  HGB 14.1  HCT 43.2  PLT 193   ------------------------------------------------------------------------------------------------------------------  Chemistries  Recent Labs  Lab 05/03/21 1521  NA 134*  K 3.7  CL 102  CO2 24  GLUCOSE 139*  BUN 9  CREATININE 0.65  CALCIUM 8.6*  AST 18  ALT 15  ALKPHOS 65  BILITOT 1.8*   ------------------------------------------------------------------------------------------------------------------  Cardiac Enzymes No results for input(s): TROPONINI in the last 168 hours. ------------------------------------------------------------------------------------------------------------------  RADIOLOGY:  DG Chest 2 View  Result Date: 05/03/2021 CLINICAL DATA:  Cough, fever and sore throat which began yesterday. Wheezing upon arrival. EXAM: CHEST - 2 VIEW COMPARISON:  08/05/2018. FINDINGS: Heart size normal. Aortic atherosclerosis. Blunting of the left costophrenic angle may reflect  chronic pleuroparenchymal scarring versus small effusion. Diffuse pulmonary vascular congestion. No airspace consolidation. Visualized osseous structures appear intact. IMPRESSION: Pulmonary vascular congestion. Electronically Signed   By: Kerby Moors M.D.   On: 05/03/2021 14:30   CT Angio Chest PE W/Cm &/Or Wo Cm  Result Date: 05/03/2021 CLINICAL DATA:  Pulmonary embolism suspected in a 76 year old female. EXAM: CT ANGIOGRAPHY CHEST WITH CONTRAST TECHNIQUE: Multidetector CT imaging of the chest was performed using the standard protocol during bolus administration of intravenous contrast. Multiplanar CT image reconstructions and MIPs were obtained to evaluate the vascular anatomy. RADIATION DOSE REDUCTION: This exam was performed according to the departmental dose-optimization program which includes automated exposure control, adjustment of the mA and/or kV according to patient size and/or use of iterative reconstruction technique. CONTRAST:  58mL OMNIPAQUE IOHEXOL 350 MG/ML SOLN COMPARISON:  Comparison is made with chest x-ray of the same date. FINDINGS: Cardiovascular: Calcified and noncalcified atheromatous plaque of the thoracic aorta without aneurysmal dilation. Normal caliber of central pulmonary vessels. Heart size moderately enlarged. Central pulmonary arteries are well opacified with a maximal attenuation of 381 Hounsfield units. Exam mildly limited by motion related artifact. Negative for pulmonary embolism. Mediastinum/Nodes: Esophagus grossly normal. No thoracic inlet lymphadenopathy. No mediastinal lymphadenopathy. No hilar lymphadenopathy. No axillary lymphadenopathy. Lungs/Pleura: Basilar atelectasis. No effusion. No dense consolidation. Mild mosaic attenuation at the lung bases. LEFT upper lobe pulmonary nodule (image 44/9) this measures approximately 5 mm potentially bandlike but with motion artifact this shows limited assessment. The airways are patent into the chest. Upper Abdomen:  Incidental imaging of upper abdominal contents shows no acute process. Lobular hepatic contours and signs of mild to moderate splenomegaly. Musculoskeletal: No acute musculoskeletal process. Spinal degenerative changes. A 2.5 cm area adjacent to the T10 vertebral body along the RIGHT paraspinal region showing no adjacent bony changes was present in May of 2020 and is slightly larger previously measuring 2 cm. This shows low-density. Review of the MIP images confirms the above findings. IMPRESSION: 1. Negative for pulmonary embolism. 2. LEFT upper lobe pulmonary nodule measures approximately 5 mm potentially bandlike but with motion artifact this shows limited assessment. No follow-up  needed if patient is low-risk. Non-contrast chest CT can be considered in 12 months if patient is high-risk. This recommendation follows the consensus statement: Guidelines for Management of Incidental Pulmonary Nodules Detected on CT Images: From the Fleischner Society 2017; Radiology 2017; 284:228-243. 3. Low-attenuation along the RIGHT paraspinal region is of uncertain significance potentially related to lymphatic malformation or even atypical location for thoracic duct. Consider follow-up spinal MRI however in this patient with history of breast cancer for definitive assessment assessment. Behavior is in keeping with an indolent or benign process. 4. Mild mosaic attenuation at the lung bases, nonspecific, can be seen in the setting of small airways disease. 5. Lobular hepatic contours with mildly nodular surface of the liver and with mild splenomegaly, correlate with signs of liver disease. 6. Aortic atherosclerosis. Aortic Atherosclerosis (ICD10-I70.0). Electronically Signed   By: Zetta Bills M.D.   On: 05/03/2021 18:17      IMPRESSION AND PLAN:  Principal Problem:   Acute CHF (congestive heart failure) ([place])  1.  New onset acute CHF, likely diastolic. - The patient be admitted to a cardiac telemetry observation bed. -  She will be diuresed with IV Lasix. - Will follow sodium level given mild hyponatremia. - We will follow serial troponins. - 2D echo and cardiology consult will be obtained. - I notified Dr. Saralyn Pilar about the patient.  2.  Paroxysmal atrial fibrillation with rapid ventricular response. - She responded to IV and p.o. Lopressor. - We will continue with her Cardizem till she is euvolemic and beta-blocker therapy can be used.  3.  Essential hypertension. - We will continue her antihypertensives including Maxide and Tiazac.  4.  Dyslipidemia. - We will continue statin therapy.  5.  GERD. - We will continue PPI therapy.  6.  Depression and anxiety. - We will continue Celexa and BuSpar.  7.  Peripheral vascular disease. - We will continue aspirin and statin therapy.  8.  Type 2 diabetes mellitus. - We will place her on supplement coverage with NovoLog.  9.  Abnormal paraspinal area on chest CT. - This can be followed on outpatient basis with MRI given history of breast cancer.  DVT prophylaxis: Coumadin.   Advanced Care Planning:  Code Status: This was discussed with the patient.  She is DNR/DNI. Family Communication:  The plan of care was discussed in details with the patient (and family). I answered all questions. The patient agreed to proceed with the above mentioned plan. Further management will depend upon hospital course. Disposition Plan: Back to previous home environment Consults called: Cardiology.   All the records are reviewed and case discussed with ED provider.  Status is: Observation  I certify that at the time of admission, it is my clinical judgment that the patient will require inpatient hospital care extending less than 2 midnights.                            Dispo: The patient is from: Home              Anticipated d/c is to: Home              Patient currently is not medically stable to d/c.              Difficult to place patient: No  Christel Mormon M.D on  05/03/2021 at 8:25 PM  [facility]   From 7 PM-7 AM, contact night-coverage [email or web address]  CC: Primary care  physician; Kirk Ruths, MD

## 2021-05-03 NOTE — ED Provider Notes (Signed)
[facility] Provider Note  Patient Contact: 3:07 PM (approximate)   History   Cough   HPI  Stacy Reese is a 76 y.o. female with a history of prior PE, hypertension type 2 diabetes, peripheral vascular disease, atrial fibrillation presents to the emergency department with 2 days of generalized malaise, headache, diarrhea and sporadic cough.  Patient denies sick contacts in the home with similar symptoms.  She denies current chest tightness or chest pain.  States that she is occasionally short of breath with ambulation.  Patient is currently anticoagulated with warfarin.      Physical Exam   Triage Vital Signs: ED Triage Vitals [05/03/21 1330]  Enc Vitals Group     BP (!) 162/95     Pulse Rate 98     Resp 20     Temp 100 F (37.8 C)     Temp src      SpO2 95 %     Weight 203 lb (92.1 kg)     Height 5\' 6"  (1.676 m)     Head Circumference      Peak Flow      Pain Score 0     Pain Loc      Pain Edu?      Excl. in [place]?     Most recent vital signs: Vitals:   05/03/21 2131 05/03/21 2242  BP: (!) 159/91 (!) 142/86  Pulse: 99 (!) 104  Resp: (!) 24 20  Temp: 98.6 F (37 C) 99.4 F (37.4 C)  SpO2: 97% 96%     Constitutional: Alert and oriented. Patient is lying supine. Eyes: Conjunctivae are normal. PERRL. EOMI. Head: Atraumatic. ENT:      Ears: Tympanic membranes are mildly injected with mild effusion bilaterally.       Nose: No congestion/rhinnorhea.      Mouth/Throat: Mucous membranes are moist. Posterior pharynx is mildly erythematous.  Hematological/Lymphatic/Immunilogical: No cervical lymphadenopathy.  Cardiovascular: Normal rate, regular rhythm. Normal S1 and S2.  Good peripheral circulation. Respiratory: Normal respiratory effort without tachypnea or retractions. Lungs CTAB. Good air entry to the bases with no decreased or absent breath sounds. Gastrointestinal: Bowel sounds 4 quadrants. Soft and nontender to palpation. No guarding  or rigidity. No palpable masses. No distention. No CVA tenderness. Musculoskeletal: Full range of motion to all extremities. No gross deformities appreciated. Neurologic:  Normal speech and language. No gross focal neurologic deficits are appreciated.  Skin:  Skin is warm, dry and intact. No rash noted. Psychiatric: Mood and affect are normal. Speech and behavior are normal. Patient exhibits appropriate insight and judgement.   ED Results / Procedures / Treatments   Labs (all labs ordered are listed, but only abnormal results are displayed) Labs Reviewed  CBC WITH DIFFERENTIAL/PLATELET - Abnormal; Notable for the following components:      Result Value   Lymphs Abs 0.6 (*)    All other components within normal limits  COMPREHENSIVE METABOLIC PANEL - Abnormal; Notable for the following components:   Sodium 134 (*)    Glucose, Bld 139 (*)    Calcium 8.6 (*)    Total Protein 8.2 (*)    Total Bilirubin 1.8 (*)    All other components within normal limits  URINALYSIS, COMPLETE (UACMP) WITH MICROSCOPIC - Abnormal; Notable for the following components:   Color, Urine AMBER (*)    APPearance HAZY (*)    Ketones, ur 20 (*)    Protein, ur 100 (*)    Leukocytes,Ua  TRACE (*)    All other components within normal limits  BRAIN NATRIURETIC PEPTIDE - Abnormal; Notable for the following components:   B Natriuretic Peptide 369.5 (*)    All other components within normal limits  PROTIME-INR - Abnormal; Notable for the following components:   Prothrombin Time 19.2 (*)    INR 1.6 (*)    All other components within normal limits  URINALYSIS, COMPLETE (UACMP) WITH MICROSCOPIC - Abnormal; Notable for the following components:   Color, Urine AMBER (*)    APPearance HAZY (*)    Ketones, ur 20 (*)    Protein, ur 100 (*)    Bacteria, UA FEW (*)    All other components within normal limits  GLUCOSE, CAPILLARY - Abnormal; Notable for the following components:   Glucose-Capillary 128 (*)    All other  components within normal limits  RESP PANEL BY RT-PCR (FLU A&B, COVID) ARPGX2  PROCALCITONIN  MAGNESIUM  BASIC METABOLIC PANEL  CBC  TROPONIN I (HIGH SENSITIVITY)  TROPONIN I (HIGH SENSITIVITY)     EKG  Atrial fibrillation with RVR.   RADIOLOGY  I personally viewed and evaluated these images as part of my medical decision making, as well as reviewing the written report by the radiologist.  ED Provider Interpretation: I personally reviewed chest x-ray and there were no consolidations, opacities or infiltrates to indicate pneumonia.  Patient did have evidence of pulmonary vascular congestion.   PROCEDURES:  Critical Care performed: Yes, see critical care procedure note(s)  .Critical Care Performed by: Lannie Fields, PA-C Authorized by: Lannie Fields, PA-C   Critical care provider statement:    Critical care time (minutes):  60   Critical care was necessary to treat or prevent imminent or life-threatening deterioration of the following conditions:  Cardiac failure and circulatory failure   Critical care was time spent personally by me on the following activities:  Blood draw for specimens, development of treatment plan with patient or surrogate, discussions with consultants, evaluation of patient's response to treatment, examination of patient, ordering and review of laboratory studies, ordering and review of radiographic studies and vascular access procedures   Care discussed with: admitting provider     MEDICATIONS ORDERED IN ED: Medications  metoprolol tartrate (LOPRESSOR) injection 5 mg (5 mg Intravenous Given 05/03/21 1741)  aspirin EC tablet 81 mg (has no administration in time range)  HYDROcodone-acetaminophen (NORCO/VICODIN) 5-325 MG per tablet 1 tablet (has no administration in time range)  letrozole [facility]) tablet 2.5 mg (has no administration in time range)  diltiazem (TIAZAC) 24 hr capsule 240 mg (has no administration in time range)  pravastatin (PRAVACHOL)  tablet 40 mg (has no administration in time range)  triamterene-hydrochlorothiazide (MAXZIDE-25) 37.5-25 MG per tablet 1 tablet (has no administration in time range)  busPIRone (BUSPAR) tablet 10 mg (has no administration in time range)  citalopram (CELEXA) tablet 20 mg (has no administration in time range)  LORazepam (ATIVAN) tablet 1 mg (has no administration in time range)  alum & mag hydroxide-simeth (MAALOX/MYLANTA) 200-200-20 MG/5ML suspension 30 mL (has no administration in time range)  pantoprazole (PROTONIX) EC tablet 40 mg (has no administration in time range)  multivitamin with minerals tablet 1 tablet (has no administration in time range)  acetaminophen (TYLENOL) tablet 650 mg (has no administration in time range)    Or  acetaminophen (TYLENOL) suppository 650 mg (has no administration in time range)  traZODone (DESYREL) tablet 25 mg (has no administration in time range)  magnesium hydroxide (MILK OF  MAGNESIA) suspension 30 mL (has no administration in time range)  ondansetron (ZOFRAN) tablet 4 mg (4 mg Oral Given 05/03/21 2334)    Or  ondansetron (ZOFRAN) injection 4 mg ( Intravenous See Alternative 05/03/21 2334)  furosemide (LASIX) injection 40 mg (has no administration in time range)  albuterol (PROVENTIL) (2.5 MG/3ML) 0.083% nebulizer solution 2.5 mg (2.5 mg Nebulization Given 05/03/21 1347)  acetaminophen (TYLENOL) tablet 650 mg (650 mg Oral Given 05/03/21 1542)  iohexol (OMNIPAQUE) 350 MG/ML injection 75 mL (75 mLs Intravenous Contrast Given 05/03/21 1653)  furosemide (LASIX) injection 40 mg (40 mg Intravenous Given 05/03/21 1842)  metoprolol tartrate (LOPRESSOR) tablet 25 mg (25 mg Oral Given 05/03/21 1842)  potassium chloride (KLOR-CON) packet 40 mEq (40 mEq Oral Given 05/03/21 2300)     IMPRESSION / MDM / ASSESSMENT AND PLAN / ED COURSE  I reviewed the triage vital signs and the nursing notes.                              Differential diagnosis includes, but is not  limited to, community-acquired pneumonia, sepsis, viral URI, COVID-19  Assessment and Plan:  A-fib with RVR Shortness of breath 76 year old female presents to the emergency department with cough, shortness of breath, generalized malaise and diarrhea that started 2 days ago.  Vital signs were reassuring at triage.  Given aforementioned complaints and endorsement of shortness of breath, EKG and labs were obtained.  EKG indicated atrial fibrillation with RVR and patient was given IV metoprolol.  Delta troponin within range.  PT/INR subtherapeutic.  Patient had a normal white blood cell count and procalcitonin was within range.  BNP was mildly elevated.  Given new onset shortness of breath CTA was obtained which showed no evidence of PE or significant pulmonary edema.  Chest x-ray showed pulmonary vascular congestion but no other signs of pneumonia.  Patient was given oral metoprolol and IV furosemide prior to admission.    Patient was excepted for admission under the care of Dr. Sidney Ace.     Clinical Course as of 05/04/21 0005  Sat May 03, 2021  1708 Pulse Rate: 98 [JW]    Clinical Course User Index [JW] Lannie Fields, PA-C     FINAL CLINICAL IMPRESSION(S) / ED DIAGNOSES   Final diagnoses:  Atrial fibrillation, unspecified type ([place])  Shortness of breath     Rx / DC Orders   ED Discharge Orders     None        Note:  This document was prepared using Dragon voice recognition software and may include unintentional dictation errors.   Vallarie Mare Santo, PA-C 05/04/21 0006    Blake Divine, MD 05/04/21 [phone]

## 2021-05-03 NOTE — Plan of Care (Signed)
The patient is admitted from Er to room 246. A & o x 4 with some forgetfulness. The patient is oriented to her room, call bell and ascom and staff. Educated patient on the safety measures and she verbalized understaffing. Will continue to monitor.

## 2021-05-03 NOTE — ED Notes (Signed)
Pt. Ambulating in room. Pt states she feels restless, this RN disconnected pt. From her cardiac monitor while she is ambulating for pt. Safety and to reduce fall risk. Pt. Gait is steady. Pt's linens changed. Pt conversing with this RN pleasantly, alert and oriented.

## 2021-05-03 NOTE — ED Notes (Signed)
Per family request, no d/c VS obtained.

## 2021-05-03 NOTE — ED Notes (Signed)
Notified charge RN that pt's inpatient room is ready, however, no RN is assigned.

## 2021-05-03 NOTE — ED Triage Notes (Addendum)
Pt via POV from home. Pt c/o cough, fever, and sore throat that started yesterday. Pt is wheezing upon arrival. Denies any pain other than sore throat. Pt states that fever was 100.0 last night. Denies taking any tylenol or ibuprofen. Pt has not been tested for covid or flu. Pt is A&OX4 and NAD.

## 2021-05-03 NOTE — ED Notes (Signed)
Pt. Up to bathroom to obtain urine sample, gait steady.

## 2021-05-03 NOTE — ED Notes (Signed)
Attempted to call 2A to get nurse assignment for pt's inpatient room. No answer. Agricultural consultant notified.

## 2021-05-04 ENCOUNTER — Observation Stay
Admit: 2021-05-04 | Discharge: 2021-05-04 | Disposition: A | Discharge status: Home / Self Care | Payer: Medicare HMO | Attending: Family Medicine | Admitting: Family Medicine

## 2021-05-04 DIAGNOSIS — I4891 Unspecified atrial fibrillation: Secondary | ICD-10-CM

## 2021-05-04 DIAGNOSIS — I5031 Acute diastolic (congestive) heart failure: Secondary | ICD-10-CM | POA: Diagnosis not present

## 2021-05-04 DIAGNOSIS — R0602 Shortness of breath: Secondary | ICD-10-CM | POA: Diagnosis not present

## 2021-05-04 LAB — ECHOCARDIOGRAM COMPLETE
AR max vel: 1.33 cm2
AV Area VTI: 1.61 cm2
AV Area mean vel: 1.36 cm2
AV Mean grad: 4 mmHg
AV Peak grad: 6.6 mmHg
Ao pk vel: 1.28 m/s
Area-P 1/2: 4.74 cm2
Calc EF: 65.6 %
Height: 66 in
MV VTI: 1.78 cm2
S' Lateral: 3.39 cm
Single Plane A2C EF: 61.5 %
Single Plane A4C EF: 70.2 %
Weight: 3202.84 oz

## 2021-05-04 LAB — CBC
HCT: 44.5 % (ref 36.0–46.0)
Hemoglobin: 14.5 g/dL (ref 12.0–15.0)
MCH: 27.9 pg (ref 26.0–34.0)
MCHC: 32.6 g/dL (ref 30.0–36.0)
MCV: 85.7 fL (ref 80.0–100.0)
Platelets: 193 10*3/uL (ref 150–400)
RBC: 5.19 MIL/uL — ABNORMAL HIGH (ref 3.87–5.11)
RDW: 13.6 % (ref 11.5–15.5)
WBC: 7.7 10*3/uL (ref 4.0–10.5)
nRBC: 0 % (ref 0.0–0.2)

## 2021-05-04 LAB — HEPATIC FUNCTION PANEL
ALT: 17 U/L (ref 0–44)
AST: 21 U/L (ref 15–41)
Albumin: 3.8 g/dL (ref 3.5–5.0)
Alkaline Phosphatase: 63 U/L (ref 38–126)
Bilirubin, Direct: 0.4 mg/dL — ABNORMAL HIGH (ref 0.0–0.2)
Indirect Bilirubin: 1.5 mg/dL — ABNORMAL HIGH (ref 0.3–0.9)
Total Bilirubin: 1.9 mg/dL — ABNORMAL HIGH (ref 0.3–1.2)
Total Protein: 8.1 g/dL (ref 6.5–8.1)

## 2021-05-04 LAB — BASIC METABOLIC PANEL
Anion gap: 9 (ref 5–15)
BUN: 10 mg/dL (ref 8–23)
CO2: 27 mmol/L (ref 22–32)
Calcium: 8.6 mg/dL — ABNORMAL LOW (ref 8.9–10.3)
Chloride: 99 mmol/L (ref 98–111)
Creatinine, Ser: 0.81 mg/dL (ref 0.44–1.00)
GFR, Estimated: >60 mL/min (ref >60)
Glucose, Bld: 151 mg/dL — ABNORMAL HIGH (ref 70–99)
Potassium: 4.3 mmol/L (ref 3.5–5.1)
Sodium: 135 mmol/L (ref 135–145)

## 2021-05-04 LAB — GLUCOSE, CAPILLARY
Glucose-Capillary: 121 mg/dL — ABNORMAL HIGH (ref 70–99)
Glucose-Capillary: 180 mg/dL — ABNORMAL HIGH (ref 70–99)

## 2021-05-04 LAB — PROTIME-INR
INR: 1.4 — ABNORMAL HIGH (ref 0.8–1.2)
Prothrombin Time: 16.8 seconds — ABNORMAL HIGH (ref 11.4–15.2)

## 2021-05-04 MED ORDER — MULTI-VITAMIN/MINERALS PO TABS: 1.0000 | ORAL_TABLET | Freq: Every day | ORAL | 0 refills | Status: DC

## 2021-05-04 MED ORDER — WARFARIN - PHARMACIST DOSING INPATIENT: Freq: Every day | Status: DC

## 2021-05-04 MED ORDER — WARFARIN SODIUM 10 MG PO TABS
10.0000 mg | ORAL_TABLET | Freq: Once | ORAL | Status: DC
  Filled 2021-05-04: qty 1

## 2021-05-04 MED ORDER — PERFLUTREN LIPID MICROSPHERE
1.0000 mL | INTRAVENOUS | Status: AC | PRN
  Administered 2021-05-04: 2 mL via INTRAVENOUS
  Filled 2021-05-04: qty 10

## 2021-05-04 NOTE — Progress Notes (Signed)
[place] for warfarin Indication: atrial fibrillation  Allergies  Allergen Reactions   Metoprolol Shortness Of Breath   Other Other (See Comments)    Sodium pentothal  Causes blood clots    Patient Measurements: Height: 5\' 6"  (167.6 cm) Weight: 90.8 kg (200 lb 2.8 oz) IBW/kg (Calculated) : 59.3  Vital Signs: Temp: 99.2 F (37.3 C) (02/12 0445) Temp Source: Oral (02/12 0445) BP: 138/79 (02/12 0444) Pulse Rate: 96 (02/12 0444)  Labs: Recent Labs    05/03/21 1521 05/03/21 1721 05/04/21 0458  HGB 14.1  --  14.5  HCT 43.2  --  44.5  PLT 193  --  193  LABPROT 19.2*  --   --   INR 1.6*  --   --   CREATININE 0.65  --  0.81  TROPONINIHS 9 9  --     Estimated Creatinine Clearance: 70.2 mL/min (by C-G formula based on SCr of 0.81 mg/dL).   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 01/30/2010   R inf pretibial - ED&C    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 04/14/2016   L mid med pretibial    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 05/20/2016   L mid to distal med pretibial    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 07/20/2016   L mid  med pretibial    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 05/10/2017   L mid pretibial    Personal history of chemotherapy    1 round   Personal history of radiation therapy    Skin cancer    squamous cell   Squamous cell carcinoma of skin 03/24/2006   L sup pretibial    Squamous cell carcinoma of skin 03/24/2006   R pretibial inf    Squamous cell carcinoma of skin 05/25/2006   L lat mid lower leg    Squamous cell carcinoma of skin 06/29/2006   R pretibial lat    Squamous cell carcinoma of skin 10/17/2012   R prox pretibial ED&C   Squamous cell carcinoma of skin 12/19/2012   R prox sup pretibial    Squamous cell carcinoma of skin 02/03/2016   L med mid pretibial    Squamous cell carcinoma of skin 08/20/2016   L pretibial sup    Squamous cell carcinoma of skin 08/20/2016   L pretibial inf    Squamous cell carcinoma of skin 10/10/2018   L prox med pretibial    Squamous cell carcinoma of skin 06/29/2019   L calf     Medications:  Medications Prior to Admission  Medication Sig Dispense Refill Last Dose   alendronate (  FOSAMAX) 70 MG tablet TAKE ONE TABLET BY MOUTH ONCE WEEKLY. TAKE WITH A FULL GLASS OF WATER ON AN EMPTY STOMACH 12 tablet 3 Past Week   buPROPion (WELLBUTRIN SR) 150 MG 12 hr tablet Take 150 mg by mouth 2 (two) times daily.   05/03/2021   busPIRone (BUSPAR) 10 MG tablet Take 10 mg by mouth daily.    05/03/2021   citalopram (CELEXA) 20 MG tablet Take 20 mg by mouth daily.    05/03/2021   diltiazem (TIAZAC) 240 MG 24 hr capsule Take 240 mg by mouth daily.    05/03/2021   LORazepam (ATIVAN) 1 MG tablet Take 1 mg by mouth daily as needed for anxiety.    prn at prn   pantoprazole (PROTONIX) 40 MG tablet Take 1 tablet (40 mg total) by mouth daily. 30 tablet 11 05/03/2021   pravastatin (PRAVACHOL) 40 MG tablet Take 40 mg by mouth daily.    05/03/2021   traZODone (DESYREL) 50 MG tablet Take 50 mg by mouth at bedtime.   05/02/2021   triamterene-hydrochlorothiazide  (MAXZIDE-25) 37.5-25 MG per tablet Take 1 tablet by mouth daily.    05/03/2021   warfarin (COUMADIN) 2 MG tablet Take 2 mg by mouth See admin instructions. Take with 5 mg for a total of 7 mg   05/02/2021   warfarin (COUMADIN) 5 MG tablet Take 5 mg by mouth See admin instructions. Takes along with a 2mg  to equal 7mg  daily   05/02/2021   Scheduled:   aspirin EC  81 mg Oral Daily   busPIRone  10 mg Oral Daily   citalopram  20 mg Oral Daily   diltiazem  240 mg Oral Daily   furosemide  40 mg Intravenous Q12H   multivitamin with minerals  1 tablet Oral Daily   pantoprazole  40 mg Oral Daily   pravastatin  40 mg Oral q1800   triamterene-hydrochlorothiazide  1 tablet Oral Daily   Infusions:  PRN: acetaminophen **OR** acetaminophen, alum & mag hydroxide-simeth, HYDROcodone-acetaminophen, LORazepam, magnesium hydroxide, metoprolol tartrate, ondansetron **OR** ondansetron (ZOFRAN) IV, traZODone Anti-infectives (From admission, onward)    None       PTA warfarin 7 mg daily  Assessment: 75YOF presenting with cough, fever, sore throat. PMH includes prior PE, hypertension type 2 diabetes, peripheral vascular disease, and atrial fibrillation on warfarin PTA with CHADS2Vasc estimated 6.   Drug-Drug Interactions  Aspirin 81 mg daily (inpatient and PTA med)   Date INR Warfarin Dose  2/10 PTA Warfarin PTA 7 mg  2/11  1.6 --  2/12 1.4 Warfarin 10 mg    Goal of Therapy:  INR 2-3 Monitor platelets by anticoagulation protocol: Yes    Plan: INR subtherapeutic. Giving 1.5X PTA dose Give warfarin 10 mg x 1 INR, CBC daily   Wynelle Cleveland, PharmD Pharmacy Resident  05/04/2021 8:30 AM

## 2021-05-04 NOTE — Discharge Summary (Signed)
Physician Discharge Summary   Patient: Stacy Reese MRN: [id number] DOB: 1946-02-02  Admit date:     05/03/2021  Discharge date: 05/04/21  Discharge Physician: Lorella Nimrod   PCP: Kirk Ruths, MD   Recommendations at discharge:  Please follow-up in Coumadin clinic as your INR was low. Follow-up with cardiology in 1 week Follow-up with your primary care doctor  Discharge Diagnoses: Principal Problem:   Acute CHF (congestive heart failure) (HCC) Active Problems:   Shortness of breath   Hospital Course: Stacy Reese is a 76 y.o. Caucasian female with medical history significant for chronic atrial fibrillation, on Coumadin, type 2 diabetes mellitus, PVD, hypertension, dyslipidemia, PE and DVT, who presented to emergency room with acute onset of generalized malaise, and headache with associated dry cough for the last couple of days with associated dyspnea worsening on exertion.  Patient denies any orthopnea or PND. On presentation she was having mildly elevated blood pressure, also found to be in A-fib with RVR.  Mildly elevated BNP at 369.  Troponin remains negative.  INR was subtherapeutic as patient was on Coumadin.  Concern of noncompliance. CTA was negative for PE, did show a left upper lobe pulmonary nodule measures approximately 5 mm and they are recommending follow-up in 12 months. There was also a low-attenuation along the right paraspinal region of uncertain significance.  Based on her history of breast cancer they are recommending outpatient spinal MRI which can be done by her PCP.  Most likely indolent or benign process. There was also noted to lobular hepatic contour with mildly nodular space of liver and with mild splenomegaly, can be followed up with PCP. Echocardiogram without any significant abnormality and normal EF.  Patient was admitted for concern of diastolic dysfunction, received IV Lasix.  She appears euvolemic the next day.  Remained on room air.  Cardiology  was also consulted.  They were recommending low-salt diet and close outpatient cardiology follow-up for further recommendations.  She will continue current medications.  Her RVR responded well to Lopressor.  She will continue Cardizem and follow-up with cardiology.  Her INR was subtherapeutic on Coumadin.  She needs a close follow-up at Coumadin clinic for dose adjustment.  Currently she will continue with current dose.  Patient will continue the rest of her home medications and follow-up with her providers.  Consultants: Cardiology Procedures performed: None Disposition: Home Diet recommendation:  Discharge Diet Orders (From admission, onward)     Start     Ordered   05/04/21 0000  Diet - low sodium heart healthy        05/04/21 1351           Cardiac and Carb modified diet  DISCHARGE MEDICATION: Allergies as of 05/04/2021       Reactions   Metoprolol Shortness Of Breath   Other Other (See Comments)   Sodium pentothal  Causes blood clots        Medication List     STOP taking these medications    buPROPion 150 MG 12 hr tablet Commonly known as: WELLBUTRIN SR       TAKE these medications    alendronate 70 MG tablet Commonly known as: FOSAMAX TAKE ONE TABLET BY MOUTH ONCE WEEKLY. TAKE WITH A FULL GLASS OF WATER ON AN EMPTY STOMACH   busPIRone 10 MG tablet Commonly known as: BUSPAR Take 10 mg by mouth daily.   citalopram 20 MG tablet Commonly known as: CELEXA Take 20 mg by mouth daily.   diltiazem  240 MG 24 hr capsule Commonly known as: TIAZAC Take 240 mg by mouth daily.   LORazepam 1 MG tablet Commonly known as: ATIVAN Take 1 mg by mouth daily as needed for anxiety.   multivitamin with minerals tablet Take 1 tablet by mouth daily. Alive   pantoprazole 40 MG tablet Commonly known as: Protonix Take 1 tablet (40 mg total) by mouth daily.   pravastatin 40 MG tablet Commonly known as: PRAVACHOL Take 40 mg by mouth daily.   traZODone 50 MG  tablet Commonly known as: DESYREL Take 50 mg by mouth at bedtime.   triamterene-hydrochlorothiazide 37.5-25 MG tablet Commonly known as: MAXZIDE-25 Take 1 tablet by mouth daily.   warfarin 5 MG tablet Commonly known as: COUMADIN Take 5 mg by mouth See admin instructions. Takes along with a 2mg  to equal 7mg  daily   warfarin 2 MG tablet Commonly known as: COUMADIN Take 2 mg by mouth See admin instructions. Take with 5 mg for a total of 7 mg        Follow-up Information     Paraschos, Alexander, MD Follow up in 1 week(s).   Specialty: Cardiology Contact information: [facility] [place] [address] [phone]         Kirk Ruths, MD. Schedule an appointment as soon as possible for a visit in 1 week(s).   Specialty: Internal Medicine Contact information: [place] [address] [phone]                 Discharge Exam: Danley Danker Weights   05/03/21 1330 05/03/21 2245 05/04/21 0449  Weight: 92.1 kg 90.3 kg 90.8 kg   General.     In no acute distress. Pulmonary.  Lungs clear bilaterally, normal respiratory effort. CV.  Regular rate and rhythm, no JVD, rub or murmur. Abdomen.  Soft, nontender, nondistended, BS positive. CNS.  Alert and oriented x3.  No focal neurologic deficit. Extremities.  No edema, no cyanosis, pulses intact and symmetrical. Psychiatry.  Judgment and insight appears normal.   Condition at discharge: good  The results of significant diagnostics from this hospitalization (including imaging, microbiology, ancillary and laboratory) are listed below for reference.   Imaging Studies: DG Chest 2 View  Result Date: 05/03/2021 CLINICAL DATA:  Cough, fever and sore throat which began yesterday. Wheezing upon arrival. EXAM: CHEST - 2 VIEW COMPARISON:  08/05/2018. FINDINGS: Heart size normal. Aortic atherosclerosis. Blunting of the left costophrenic angle may reflect chronic  pleuroparenchymal scarring versus small effusion. Diffuse pulmonary vascular congestion. No airspace consolidation. Visualized osseous structures appear intact. IMPRESSION: Pulmonary vascular congestion. Electronically Signed   By: Kerby Moors M.D.   On: 05/03/2021 14:30   CT Angio Chest PE W/Cm &/Or Wo Cm  Result Date: 05/03/2021 CLINICAL DATA:  Pulmonary embolism suspected in a 76 year old female. EXAM: CT ANGIOGRAPHY CHEST WITH CONTRAST TECHNIQUE: Multidetector CT imaging of the chest was performed using the standard protocol during bolus administration of intravenous contrast. Multiplanar CT image reconstructions and MIPs were obtained to evaluate the vascular anatomy. RADIATION DOSE REDUCTION: This exam was performed according to the departmental dose-optimization program which includes automated exposure control, adjustment of the mA and/or kV according to patient size and/or use of iterative reconstruction technique. CONTRAST:  45mL OMNIPAQUE IOHEXOL 350 MG/ML SOLN COMPARISON:  Comparison is made with chest x-ray of the same date. FINDINGS: Cardiovascular: Calcified and noncalcified atheromatous plaque of the thoracic aorta without aneurysmal dilation. Normal caliber of central pulmonary vessels. Heart size  moderately enlarged. Central pulmonary arteries are well opacified with a maximal attenuation of 381 Hounsfield units. Exam mildly limited by motion related artifact. Negative for pulmonary embolism. Mediastinum/Nodes: Esophagus grossly normal. No thoracic inlet lymphadenopathy. No mediastinal lymphadenopathy. No hilar lymphadenopathy. No axillary lymphadenopathy. Lungs/Pleura: Basilar atelectasis. No effusion. No dense consolidation. Mild mosaic attenuation at the lung bases. LEFT upper lobe pulmonary nodule (image 44/9) this measures approximately 5 mm potentially bandlike but with motion artifact this shows limited assessment. The airways are patent into the chest. Upper Abdomen: Incidental  imaging of upper abdominal contents shows no acute process. Lobular hepatic contours and signs of mild to moderate splenomegaly. Musculoskeletal: No acute musculoskeletal process. Spinal degenerative changes. A 2.5 cm area adjacent to the T10 vertebral body along the RIGHT paraspinal region showing no adjacent bony changes was present in May of 2020 and is slightly larger previously measuring 2 cm. This shows low-density. Review of the MIP images confirms the above findings. IMPRESSION: 1. Negative for pulmonary embolism. 2. LEFT upper lobe pulmonary nodule measures approximately 5 mm potentially bandlike but with motion artifact this shows limited assessment. No follow-up needed if patient is low-risk. Non-contrast chest CT can be considered in 12 months if patient is high-risk. This recommendation follows the consensus statement: Guidelines for Management of Incidental Pulmonary Nodules Detected on CT Images: From the Fleischner Society 2017; Radiology 2017; 284:228-243. 3. Low-attenuation along the RIGHT paraspinal region is of uncertain significance potentially related to lymphatic malformation or even atypical location for thoracic duct. Consider follow-up spinal MRI however in this patient with history of breast cancer for definitive assessment assessment. Behavior is in keeping with an indolent or benign process. 4. Mild mosaic attenuation at the lung bases, nonspecific, can be seen in the setting of small airways disease. 5. Lobular hepatic contours with mildly nodular surface of the liver and with mild splenomegaly, correlate with signs of liver disease. 6. Aortic atherosclerosis. Aortic Atherosclerosis (ICD10-I70.0). Electronically Signed   By: Zetta Bills M.D.   On: 05/03/2021 18:17   ECHOCARDIOGRAM COMPLETE  Result Date: 05/04/2021    ECHOCARDIOGRAM REPORT   Patient Name:   Stacy Reese Date of Exam: 05/04/2021 Medical Rec #:  [id number]     Height:       66.0 in Accession #:    [id number]     Weight:       200.2 lb Date of Birth:  20-Apr-1945      BSA:          2.001 m Patient Age:    76 years      BP:           138/79 mmHg Patient Gender: F             HR:           130 bpm. Exam Location:  [facility] Procedure: 2D Echo and Intracardiac Opacification Agent Indications:     Diastolic CHF  History:         Patient has no prior history of Echocardiogram examinations.                  Arrythmias:Atrial Fibrillation; Risk Factors:Hypertension,                  Diabetes and Pulm Embolism.  Sonographer:     L Thornton-Maynard Referring Phys:  [id number] [place] Diagnosing Phys: Isaias Cowman MD  Sonographer Comments: Suboptimal apical window. IMPRESSIONS  1. Left ventricular ejection fraction, by estimation, is 50 to  55%. The left ventricle has low normal function. The left ventricle has no regional wall motion abnormalities. Left ventricular diastolic parameters are indeterminate.  2. Right ventricular systolic function is normal. The right ventricular size is normal. There is mildly elevated pulmonary artery systolic pressure.  3. The mitral valve is normal in structure. Mild mitral valve regurgitation. No evidence of mitral stenosis.  4. The aortic valve is normal in structure. Aortic valve regurgitation is not visualized. No aortic stenosis is present.  5. The inferior vena cava is normal in size with greater than 50% respiratory variability, suggesting right atrial pressure of 3 mmHg. FINDINGS  Left Ventricle: Left ventricular ejection fraction, by estimation, is 50 to 55%. The left ventricle has low normal function. The left ventricle has no regional wall motion abnormalities. Definity contrast agent was given IV to delineate the left ventricular endocardial borders. The left ventricular internal cavity size was normal in size. There is no left ventricular hypertrophy. Left ventricular diastolic parameters are indeterminate. Right Ventricle: The right ventricular size is normal. No increase in right  ventricular wall thickness. Right ventricular systolic function is normal. There is mildly elevated pulmonary artery systolic pressure. The tricuspid regurgitant velocity is 2.70  m/s, and with an assumed right atrial pressure of 10 mmHg, the estimated right ventricular systolic pressure is 32.2 mmHg. Left Atrium: Left atrial size was normal in size. Right Atrium: Right atrial size was normal in size. Pericardium: There is no evidence of pericardial effusion. Mitral Valve: The mitral valve is normal in structure. Mild mitral valve regurgitation. No evidence of mitral valve stenosis. MV peak gradient, 5.4 mmHg. The mean mitral valve gradient is 3.0 mmHg. Tricuspid Valve: The tricuspid valve is normal in structure. Tricuspid valve regurgitation is mild . No evidence of tricuspid stenosis. Aortic Valve: The aortic valve is normal in structure. Aortic valve regurgitation is not visualized. No aortic stenosis is present. Aortic valve mean gradient measures 4.0 mmHg. Aortic valve peak gradient measures 6.6 mmHg. Aortic valve area, by VTI measures 1.61 cm. Pulmonic Valve: The pulmonic valve was normal in structure. Pulmonic valve regurgitation is not visualized. No evidence of pulmonic stenosis. Aorta: The aortic root is normal in size and structure. Venous: The inferior vena cava is normal in size with greater than 50% respiratory variability, suggesting right atrial pressure of 3 mmHg. IAS/Shunts: No atrial level shunt detected by color flow Doppler.  LEFT VENTRICLE PLAX 2D LVIDd:         4.33 cm     Diastology LVIDs:         3.39 cm     LV e' medial:    8.59 cm/s LV PW:         1.03 cm     LV E/e' medial:  11.1 LV IVS:        1.07 cm     LV e' lateral:   9.68 cm/s LVOT diam:     1.70 cm     LV E/e' lateral: 9.8 LV SV:         32 LV SV Index:   16 LVOT Area:     2.27 cm  LV Volumes (MOD) LV vol d, MOD A2C: 54.8 ml LV vol d, MOD A4C: 73.6 ml LV vol s, MOD A2C: 21.1 ml LV vol s, MOD A4C: 21.9 ml LV SV MOD A2C:     33.7  ml LV SV MOD A4C:     73.6 ml LV SV MOD BP:  44.5 ml RIGHT VENTRICLE RV S prime:     9.03 cm/s TAPSE (M-mode): 1.3 cm LEFT ATRIUM           Index        RIGHT ATRIUM           Index LA diam:      3.00 cm 1.50 cm/m   RA Area:     15.40 cm LA Vol (A2C): 43.3 ml 21.64 ml/m  RA Volume:   35.00 ml  17.50 ml/m LA Vol (A4C): 81.7 ml 40.84 ml/m  AORTIC VALVE                    PULMONIC VALVE AV Area (Vmax):    1.33 cm     PV Vmax:       1.04 m/s AV Area (Vmean):   1.36 cm     PV Peak grad:  4.3 mmHg AV Area (VTI):     1.61 cm AV Vmax:           128.00 cm/s AV Vmean:          88.800 cm/s AV VTI:            0.199 m AV Peak Grad:      6.6 mmHg AV Mean Grad:      4.0 mmHg LVOT Vmax:         74.80 cm/s LVOT Vmean:        53.200 cm/s LVOT VTI:          0.141 m LVOT/AV VTI ratio: 0.71  AORTA Ao Root diam: 2.90 cm Ao Asc diam:  3.10 cm MITRAL VALVE               TRICUSPID VALVE MV Area (PHT): 4.74 cm    TR Peak grad:   29.2 mmHg MV Area VTI:   1.78 cm    TR Vmax:        270.00 cm/s MV Peak grad:  5.4 mmHg MV Mean grad:  3.0 mmHg    SHUNTS MV Vmax:       1.16 m/s    Systemic VTI:  0.14 m MV Vmean:      78.8 cm/s   Systemic Diam: 1.70 cm MV Decel Time: 160 msec MV E velocity: 95.20 cm/s Isaias Cowman MD Electronically signed by Isaias Cowman MD Signature Date/Time: 05/04/2021/1:27:01 PM    Final     Microbiology: Results for orders placed or performed during the hospital encounter of 05/03/21  Resp Panel by RT-PCR (Flu A&B, Covid) Nasopharyngeal Swab     Status: None   Collection Time: 05/03/21  1:40 PM   Specimen: Nasopharyngeal Swab; Nasopharyngeal(NP) swabs in vial transport medium  Result Value Ref Range Status   SARS Coronavirus 2 by RT PCR NEGATIVE NEGATIVE Final    Comment: (NOTE) SARS-CoV-2 target nucleic acids are NOT DETECTED.  The SARS-CoV-2 RNA is generally detectable in upper respiratory specimens during the acute phase of infection. The lowest concentration of SARS-CoV-2 viral  copies this assay can detect is 138 copies/mL. A negative result does not preclude SARS-Cov-2 infection and should not be used as the sole basis for treatment or other patient management decisions. A negative result may occur with  improper specimen collection/handling, submission of specimen other than nasopharyngeal swab, presence of viral mutation(s) within the areas targeted by this assay, and inadequate number of viral copies(<138 copies/mL). A negative result must be combined with clinical observations, patient history, and epidemiological information. The expected  result is Negative.  Fact Sheet for Patients:  EntrepreneurPulse.com.au  Fact Sheet for Healthcare Providers:  IncredibleEmployment.be  This test is no t yet approved or cleared by the [place] FDA and  has been authorized for detection and/or diagnosis of SARS-CoV-2 by FDA under an Emergency Use Authorization (EUA). This EUA will remain  in effect (meaning this test can be used) for the duration of the COVID-19 declaration under Section 564(b)(1) of the Act, 21 U.S.C.section 360bbb-3(b)(1), unless the authorization is terminated  or revoked sooner.       Influenza A by PCR NEGATIVE NEGATIVE Final   Influenza B by PCR NEGATIVE NEGATIVE Final    Comment: (NOTE) The Xpert Xpress SARS-CoV-2/FLU/RSV plus assay is intended as an aid in the diagnosis of influenza from Nasopharyngeal swab specimens and should not be used as a sole basis for treatment. Nasal washings and aspirates are unacceptable for Xpert Xpress SARS-CoV-2/FLU/RSV testing.  Fact Sheet for Patients: EntrepreneurPulse.com.au  Fact Sheet for Healthcare Providers: IncredibleEmployment.be  This test is not yet approved or cleared by the [place] FDA and has been authorized for detection and/or diagnosis of SARS-CoV-2 by FDA under an Emergency Use Authorization (EUA). This  EUA will remain in effect (meaning this test can be used) for the duration of the COVID-19 declaration under Section 564(b)(1) of the Act, 21 U.S.C. section 360bbb-3(b)(1), unless the authorization is terminated or revoked.  Performed at [facility], [address] [place], [place] [address]     Labs: CBC: Recent Labs  Lab 05/03/21 1521 05/04/21 0458  WBC 8.6 7.7  NEUTROABS 7.2  --   HGB 14.1 14.5  HCT 43.2 44.5  MCV 85.0 85.7  PLT 193 010   Basic Metabolic Panel: Recent Labs  Lab 05/03/21 1521 05/03/21 1721 05/04/21 0458  NA 134*  --  135  K 3.7  --  4.3  CL 102  --  99  CO2 24  --  27  GLUCOSE 139*  --  151*  BUN 9  --  10  CREATININE 0.65  --  0.81  CALCIUM 8.6*  --  8.6*  MG  --  1.9  --    Liver Function Tests: Recent Labs  Lab 05/03/21 1521 05/04/21 0458  AST 18 21  ALT 15 17  ALKPHOS 65 63  BILITOT 1.8* 1.9*  PROT 8.2* 8.1  ALBUMIN 4.1 3.8   CBG: Recent Labs  Lab 05/03/21 2244 05/04/21 0826 05/04/21 1213  GLUCAP 128* 121* 180*    Discharge time spent: greater than 30 minutes.  Signed: Lorella Nimrod, MD [facility] 05/04/2021

## 2021-05-04 NOTE — Plan of Care (Signed)
°  Problem: Education: Goal: Ability to demonstrate management of disease process will improve 05/04/2021 1501 by Emmaline Life, RN Outcome: Adequate for Discharge 05/04/2021 1358 by Emmaline Life, RN Outcome: Progressing Goal: Ability to verbalize understanding of medication therapies will improve 05/04/2021 1501 by Emmaline Life, RN Outcome: Adequate for Discharge 05/04/2021 1358 by Emmaline Life, RN Outcome: Progressing Goal: Individualized Educational Video(s) Outcome: Adequate for Discharge   Problem: Activity: Goal: Capacity to carry out activities will improve Outcome: Adequate for Discharge   Problem: Cardiac: Goal: Ability to achieve and maintain adequate cardiopulmonary perfusion will improve Outcome: Adequate for Discharge   Problem: Education: Goal: Knowledge of General Education information will improve Description: Including pain rating scale, medication(s)/side effects and non-pharmacologic comfort measures Outcome: Adequate for Discharge   Problem: Health Behavior/Discharge Planning: Goal: Ability to manage health-related needs will improve Outcome: Adequate for Discharge   Problem: Clinical Measurements: Goal: Ability to maintain clinical measurements within normal limits will improve Outcome: Adequate for Discharge Goal: Will remain free from infection Outcome: Adequate for Discharge Goal: Diagnostic test results will improve Outcome: Adequate for Discharge Goal: Respiratory complications will improve Outcome: Adequate for Discharge Goal: Cardiovascular complication will be avoided Outcome: Adequate for Discharge   Problem: Activity: Goal: Risk for activity intolerance will decrease Outcome: Adequate for Discharge   Problem: Nutrition: Goal: Adequate nutrition will be maintained Outcome: Adequate for Discharge   Problem: Coping: Goal: Level of anxiety will decrease Outcome: Adequate for Discharge   Problem: Elimination: Goal: Will  not experience complications related to bowel motility Outcome: Adequate for Discharge Goal: Will not experience complications related to urinary retention Outcome: Adequate for Discharge   Problem: Pain Managment: Goal: General experience of comfort will improve Outcome: Adequate for Discharge   Problem: Safety: Goal: Ability to remain free from injury will improve Outcome: Adequate for Discharge   Problem: Skin Integrity: Goal: Risk for impaired skin integrity will decrease Outcome: Adequate for Discharge

## 2021-05-04 NOTE — Consult Note (Signed)
[facility] Cardiology  CARDIOLOGY CONSULT NOTE  Patient ID: Stacy Reese MRN: [id number] DOB/AGE: 1945/09/12 76 y.o.  Admit date: 05/03/2021 Referring Physician Reesa Chew Primary Physician [facility] Primary Cardiologist  Reason for Consultation congestive heart failure  HPI: 76 year old female referred for evaluation of congestive heart failure.  Patient has a history of essential hypertension, hyperlipidemia, atrial fibrillation on Coumadin, type 2 diabetes, and prior history of DVT and pulmonary embolus.  He presents to [facility] ED symptoms he felt was consistent with COVID low-grade fever, generalized malaise, headache, and shortness of breath.  Patient is negative for COVID, and influenza a and B.  ECG revealed atrial fibrillation at a rate of 123 bpm.  Chest x-ray revealed pulmonary vascular congestion.  BNP was 369.5.  High-sensitivity troponin was 9.  She was treated with furosemide IV 40 mg twice daily with good diuresis (net output 1100 cc) and good overall clinical improvement.  INR is 1.6.  Prior [employer] 12/29/2018 revealed LVEF 62% without evidence for scar or ischemia.  The patient reports that she does not follow a low-sodium no added salt diet.  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 01/30/2010   R inf pretibial - ED&C    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 04/14/2016   L mid med pretibial    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 05/20/2016   L mid to distal med pretibial    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 07/20/2016   L mid med pretibial    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 05/10/2017   L mid pretibial    Personal history of chemotherapy    1 round   Personal history of radiation therapy    Skin cancer    squamous cell   Squamous cell carcinoma of skin 03/24/2006   L sup pretibial    Squamous cell carcinoma of skin 03/24/2006   R pretibial inf    Squamous cell carcinoma of skin 05/25/2006   L lat mid lower leg    Squamous cell carcinoma of skin 06/29/2006   R pretibial lat    Squamous cell carcinoma of skin 10/17/2012   R prox pretibial ED&C   Squamous cell carcinoma of skin 12/19/2012   R prox sup pretibial    Squamous cell carcinoma of skin 02/03/2016   L med mid pretibial    Squamous cell carcinoma of skin 08/20/2016   L pretibial sup    Squamous cell carcinoma of skin 08/20/2016   L pretibial inf    Squamous cell carcinoma of skin 10/10/2018   L  prox med pretibial    Squamous cell carcinoma of skin 06/29/2019   L calf     Past Surgical History:  Procedure Laterality Date   BREAST BIOPSY Left 09/04/2013   negative stereo bx   BREAST BIOPSY Left 08/25/2013   postive [place] core bx   BREAST LUMPECTOMY Left 08/2017   BREAST LUMPECTOMY W/ NEEDLE LOCALIZATION Left 2015   ESOPHAGOGASTRODUODENOSCOPY (EGD) WITH PROPOFOL N/A 08/05/2018   Procedure: ESOPHAGOGASTRODUODENOSCOPY (EGD) WITH PROPOFOL;  Surgeon: Lucilla Lame, MD;  Location: [facility] ENDOSCOPY;  Service: Endoscopy;  Laterality: N/A;   GANGLION CYST EXCISION     OPEN REDUCTION INTERNAL FIXATION (ORIF) DISTAL RADIAL  FRACTURE Left 07/11/2019   Procedure: OPEN REDUCTION INTERNAL FIXATION (ORIF) DISTAL RADIAL FRACTURE;  Surgeon: Hessie Knows, MD;  Location: [facility] ORS;  Service: Orthopedics;  Laterality: Left;    Medications Prior to Admission  Medication Sig Dispense Refill Last Dose   alendronate (FOSAMAX) 70 MG tablet TAKE ONE TABLET BY MOUTH ONCE WEEKLY. TAKE WITH A FULL GLASS OF WATER ON AN EMPTY STOMACH 12 tablet 3 Past Week   buPROPion (WELLBUTRIN SR) 150 MG 12 hr tablet Take 150 mg by mouth 2 (two) times daily.   05/03/2021   busPIRone (BUSPAR) 10 MG tablet Take 10 mg by mouth daily.    05/03/2021   citalopram (CELEXA) 20 MG tablet Take 20 mg by mouth daily.    05/03/2021   diltiazem (TIAZAC) 240 MG 24 hr capsule Take 240 mg by mouth daily.    05/03/2021   LORazepam (ATIVAN) 1 MG tablet Take 1 mg by mouth daily as needed for anxiety.    prn at prn   pantoprazole (PROTONIX) 40 MG tablet Take 1 tablet (40 mg total) by mouth daily. 30 tablet 11 05/03/2021   pravastatin (PRAVACHOL) 40 MG tablet Take 40 mg by mouth daily.    05/03/2021   traZODone (DESYREL) 50 MG tablet Take 50 mg by mouth at bedtime.   05/02/2021   triamterene-hydrochlorothiazide (MAXZIDE-25) 37.5-25 MG per tablet Take 1 tablet by mouth daily.    05/03/2021   warfarin (COUMADIN) 2 MG tablet Take 2 mg by mouth See admin instructions. Take with 5 mg for a total of 7 mg   05/02/2021   warfarin (COUMADIN) 5 MG tablet Take 5 mg by mouth See admin instructions. Takes along with a 2mg  to equal 7mg  daily   05/02/2021   Social History   Socioeconomic History   Marital status: Widowed    Spouse name: Not on file   Number of children: Not on file   Years of education: Not on file   Highest education level: Not on file  Occupational History   Not on file  Tobacco Use   Smoking status: Former   Smokeless tobacco: Never  Substance and Sexual Activity   Alcohol use: Yes    Comment: social   Drug use: No   Sexual activity: Not on file  Other Topics  Concern   Not on file  Social History Narrative   Not on file   Social Determinants of Health   Financial Resource Strain: Not on file  Food Insecurity: Not on file  Transportation Needs: Not on file  Physical Activity: Not on file  Stress: Not on file  Social Connections: Not on file  Intimate Partner Violence: Not on file    Family History  Problem Relation Age of Onset   Cancer Father        colon   CAD Father  Hypertension Father    Hyperlipidemia Father    CAD Mother    Breast cancer Neg Hx       Review of systems complete and found to be negative unless listed above      PHYSICAL EXAM  General: Well developed, well nourished, in no acute distress HEENT:  Normocephalic and atramatic Neck:  No JVD.  Lungs: Clear bilaterally to auscultation and percussion. Heart: HRRR . Normal S1 and S2 without gallops or murmurs.  Abdomen: Bowel sounds are positive, abdomen soft and non-tender  Msk:  Back normal, normal gait. Normal strength and tone for age. Extremities: No clubbing, cyanosis or edema.   Neuro: Alert and oriented X 3. Psych:  Good affect, responds appropriately  Labs:   Lab Results  Component Value Date   WBC 7.7 05/04/2021   HGB 14.5 05/04/2021   HCT 44.5 05/04/2021   MCV 85.7 05/04/2021   PLT 193 05/04/2021    Recent Labs  Lab 05/04/21 0458  NA 135  K 4.3  CL 99  CO2 27  BUN 10  CREATININE 0.81  CALCIUM 8.6*  PROT 8.1  BILITOT 1.9*  ALKPHOS 63  ALT 17  AST 21  GLUCOSE 151*   Lab Results  Component Value Date   TROPONINI <0.03 08/05/2018   No results found for: CHOL No results found for: HDL No results found for: LDLCALC No results found for: TRIG No results found for: CHOLHDL No results found for: LDLDIRECT    Radiology: DG Chest 2 View  Result Date: 05/03/2021 CLINICAL DATA:  Cough, fever and sore throat which began yesterday. Wheezing upon arrival. EXAM: CHEST - 2 VIEW COMPARISON:  08/05/2018. FINDINGS: Heart size normal.  Aortic atherosclerosis. Blunting of the left costophrenic angle may reflect chronic pleuroparenchymal scarring versus small effusion. Diffuse pulmonary vascular congestion. No airspace consolidation. Visualized osseous structures appear intact. IMPRESSION: Pulmonary vascular congestion. Electronically Signed   By: Kerby Moors M.D.   On: 05/03/2021 14:30   CT Angio Chest PE W/Cm &/Or Wo Cm  Result Date: 05/03/2021 CLINICAL DATA:  Pulmonary embolism suspected in a 76 year old female. EXAM: CT ANGIOGRAPHY CHEST WITH CONTRAST TECHNIQUE: Multidetector CT imaging of the chest was performed using the standard protocol during bolus administration of intravenous contrast. Multiplanar CT image reconstructions and MIPs were obtained to evaluate the vascular anatomy. RADIATION DOSE REDUCTION: This exam was performed according to the departmental dose-optimization program which includes automated exposure control, adjustment of the mA and/or kV according to patient size and/or use of iterative reconstruction technique. CONTRAST:  71mL OMNIPAQUE IOHEXOL 350 MG/ML SOLN COMPARISON:  Comparison is made with chest x-ray of the same date. FINDINGS: Cardiovascular: Calcified and noncalcified atheromatous plaque of the thoracic aorta without aneurysmal dilation. Normal caliber of central pulmonary vessels. Heart size moderately enlarged. Central pulmonary arteries are well opacified with a maximal attenuation of 381 Hounsfield units. Exam mildly limited by motion related artifact. Negative for pulmonary embolism. Mediastinum/Nodes: Esophagus grossly normal. No thoracic inlet lymphadenopathy. No mediastinal lymphadenopathy. No hilar lymphadenopathy. No axillary lymphadenopathy. Lungs/Pleura: Basilar atelectasis. No effusion. No dense consolidation. Mild mosaic attenuation at the lung bases. LEFT upper lobe pulmonary nodule (image 44/9) this measures approximately 5 mm potentially bandlike but with motion artifact this shows  limited assessment. The airways are patent into the chest. Upper Abdomen: Incidental imaging of upper abdominal contents shows no acute process. Lobular hepatic contours and signs of mild to moderate splenomegaly. Musculoskeletal: No acute musculoskeletal process. Spinal degenerative changes. A 2.5 cm area adjacent  to the T10 vertebral body along the RIGHT paraspinal region showing no adjacent bony changes was present in May of 2020 and is slightly larger previously measuring 2 cm. This shows low-density. Review of the MIP images confirms the above findings. IMPRESSION: 1. Negative for pulmonary embolism. 2. LEFT upper lobe pulmonary nodule measures approximately 5 mm potentially bandlike but with motion artifact this shows limited assessment. No follow-up needed if patient is low-risk. Non-contrast chest CT can be considered in 12 months if patient is high-risk. This recommendation follows the consensus statement: Guidelines for Management of Incidental Pulmonary Nodules Detected on CT Images: From the Fleischner Society 2017; Radiology 2017; 284:228-243. 3. Low-attenuation along the RIGHT paraspinal region is of uncertain significance potentially related to lymphatic malformation or even atypical location for thoracic duct. Consider follow-up spinal MRI however in this patient with history of breast cancer for definitive assessment assessment. Behavior is in keeping with an indolent or benign process. 4. Mild mosaic attenuation at the lung bases, nonspecific, can be seen in the setting of small airways disease. 5. Lobular hepatic contours with mildly nodular surface of the liver and with mild splenomegaly, correlate with signs of liver disease. 6. Aortic atherosclerosis. Aortic Atherosclerosis (ICD10-I70.0). Electronically Signed   By: Zetta Bills M.D.   On: 05/03/2021 18:17    EKG: Atrial fibrillation at 126 bpm  ASSESSMENT AND PLAN:   1.  Acute diastolic congestive heart failure, likely exacerbated by  dietary noncompliance, with clinical improvement after initial diuresis, with mildly elevated BNP, and normal troponin 2.  Atrial fibrillation with rapid ventricular rate in the setting of acute diastolic congestive heart failure, heart rate improved today, on Cardizem CD 240 mg daily for rate control.  Patient subtherapeutic on warfarin with INR 1.6. 3.  Essential hypertension, pressure initially elevated, improved today after diuresis  Recommendations  1.  Agree with current therapy 2.  Continue diuresis 3.  Carefully monitor renal status 4.  Warfarin dosing per pharmacy 5.  Review 2D echocardiogram 6.  Further recommendations per 2D echocardiogram results  Signed: Isaias Cowman MD,PhD, [facility] 05/04/2021, 8:38 AM

## 2021-05-04 NOTE — Progress Notes (Signed)
Discharge instructions, RX's and follow up appts explained and provided to patient verbalized understanding.  Nehemyah Foushee, Tivis Ringer, RN

## 2021-05-04 NOTE — Plan of Care (Signed)
  Problem: Education: Goal: Ability to demonstrate management of disease process will improve Outcome: Progressing Goal: Ability to verbalize understanding of medication therapies will improve Outcome: Progressing   

## 2021-05-07 ENCOUNTER — Other Ambulatory Visit: Payer: Self-pay | Admitting: Emergency Medicine

## 2021-05-07 ENCOUNTER — Other Ambulatory Visit: Payer: Self-pay | Admitting: Oncology

## 2021-05-07 DIAGNOSIS — C50512 Malignant neoplasm of lower-outer quadrant of left female breast: Secondary | ICD-10-CM

## 2021-05-07 NOTE — Progress Notes (Signed)
Opened in error

## 2021-05-12 ENCOUNTER — Other Ambulatory Visit: Payer: Self-pay | Admitting: Unknown Physician Specialty

## 2021-05-12 DIAGNOSIS — H903 Sensorineural hearing loss, bilateral: Secondary | ICD-10-CM

## 2021-05-15 ENCOUNTER — Other Ambulatory Visit: Payer: Self-pay

## 2021-05-15 ENCOUNTER — Ambulatory Visit
Admission: RE | Admit: 2021-05-15 | Discharge: 2021-05-15 | Disposition: A | Discharge status: Home / Self Care | Payer: Medicare HMO | Source: Ambulatory Visit | Attending: Internal Medicine | Admitting: Internal Medicine

## 2021-05-15 DIAGNOSIS — N6312 Unspecified lump in the right breast, upper inner quadrant: Secondary | ICD-10-CM | POA: Diagnosis present

## 2021-05-15 IMAGING — MG MM DIGITAL DIAGNOSTIC UNILAT*R* W/ TOMO W/ CAD
4 series · 4 of 12 positions shown · non-contrast
Comparison: Previous exam(s).

CLINICAL DATA: 75-year-old female with a palpable lump along the
medial right chest wall.

EXAM:
DIGITAL DIAGNOSTIC UNILATERAL RIGHT MAMMOGRAM WITH TOMOSYNTHESIS AND
CAD; ULTRASOUND RIGHT BREAST LIMITED
TECHNIQUE: Right digital diagnostic mammography and breast tomosynthesis was
performed. The images were evaluated with computer-aided detection.;
Targeted ultrasound examination of the right breast was performed

[R CC synth-2D]
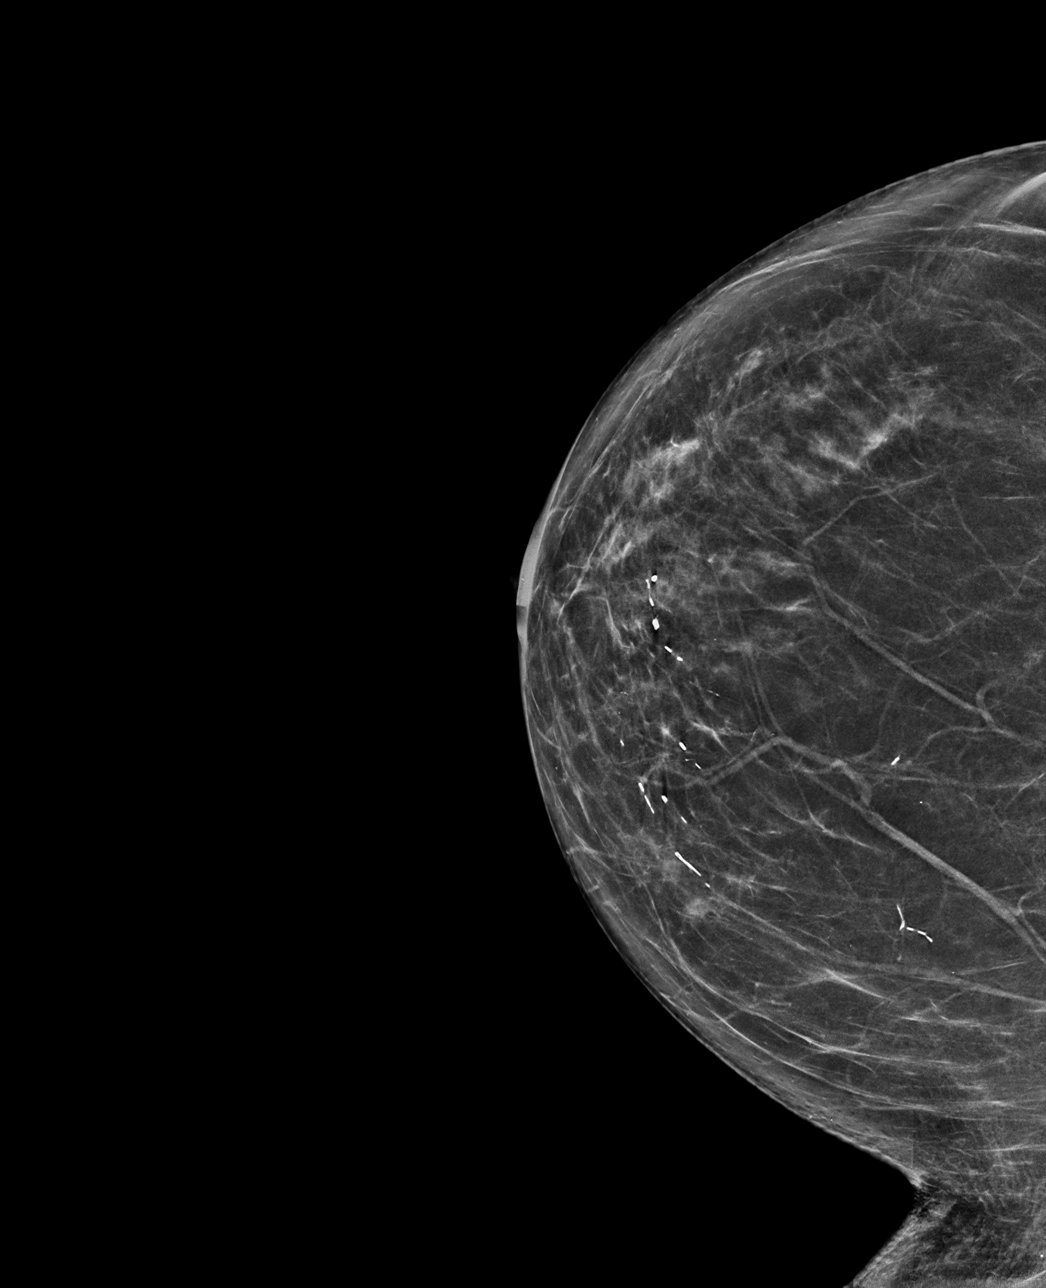

[R MLO synth-2D]
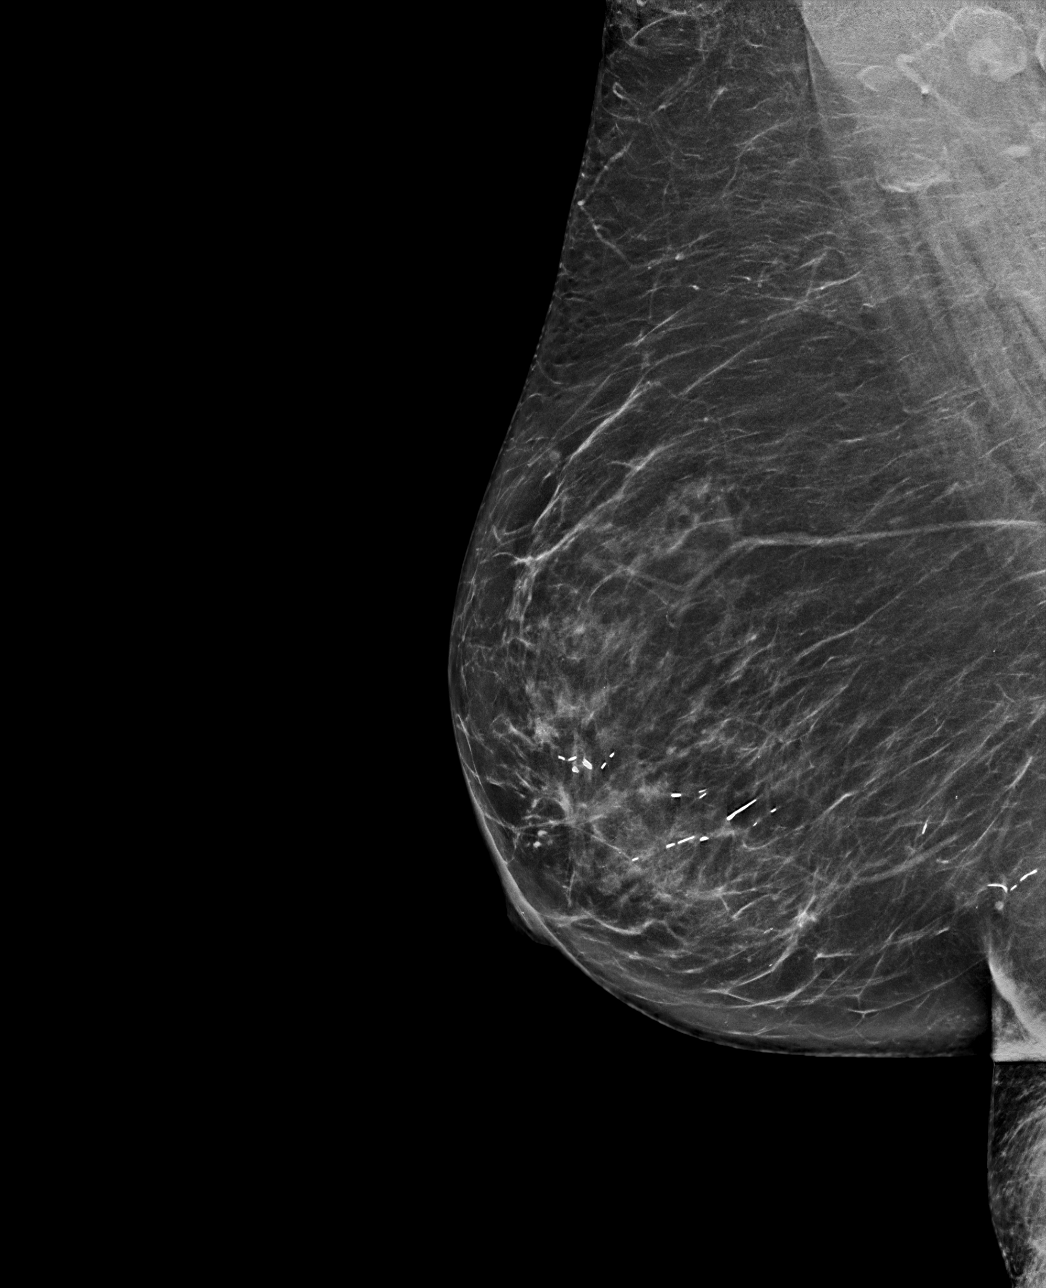

[R CC tomo · tomo slice 35/68.0]
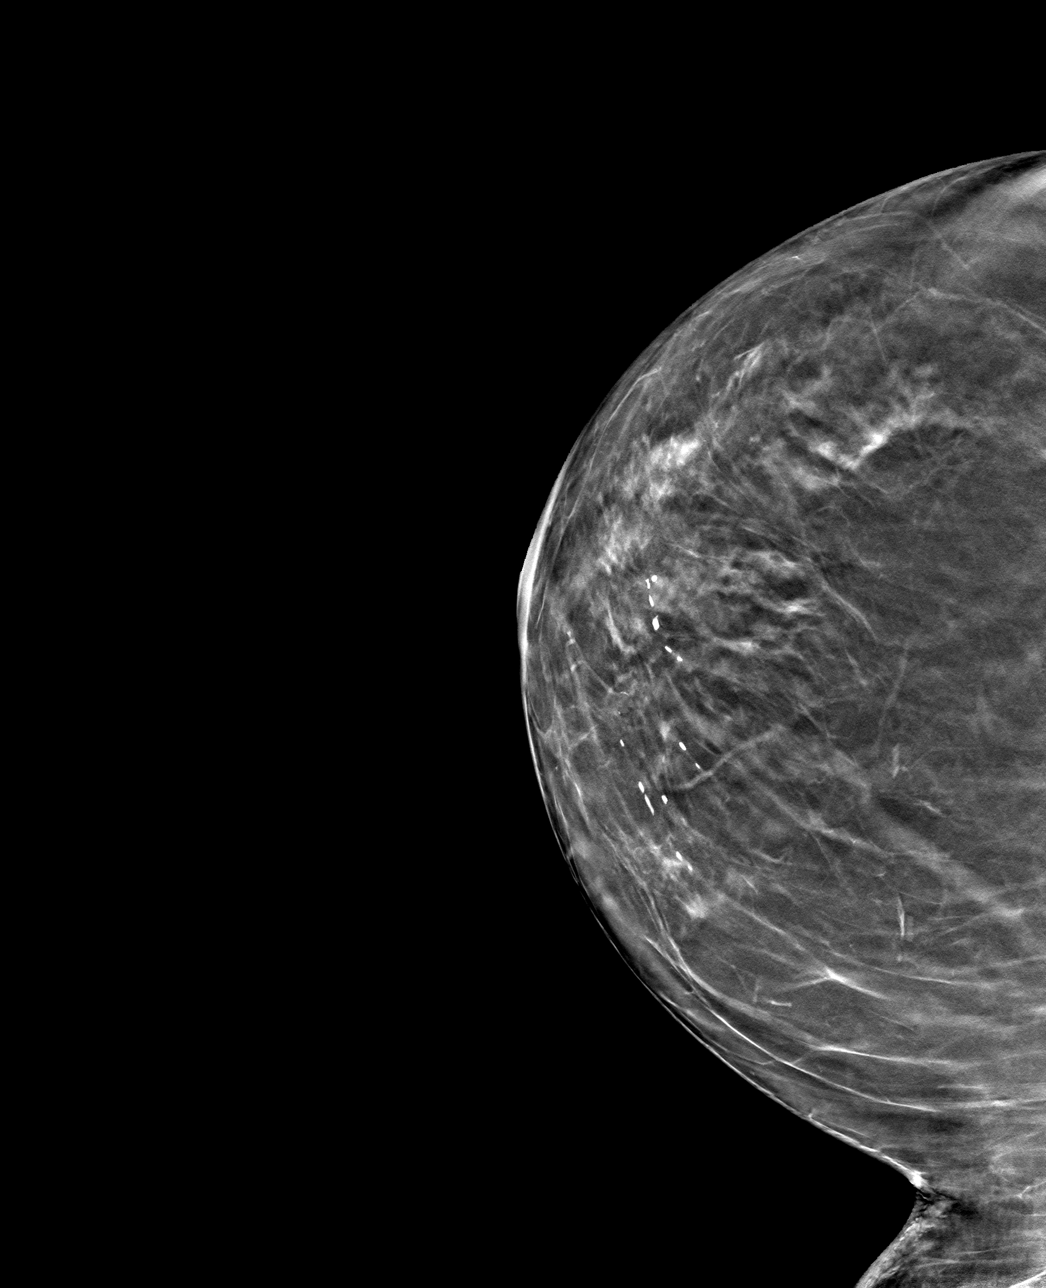

[R MLO tomo · tomo slice 39/78.0]
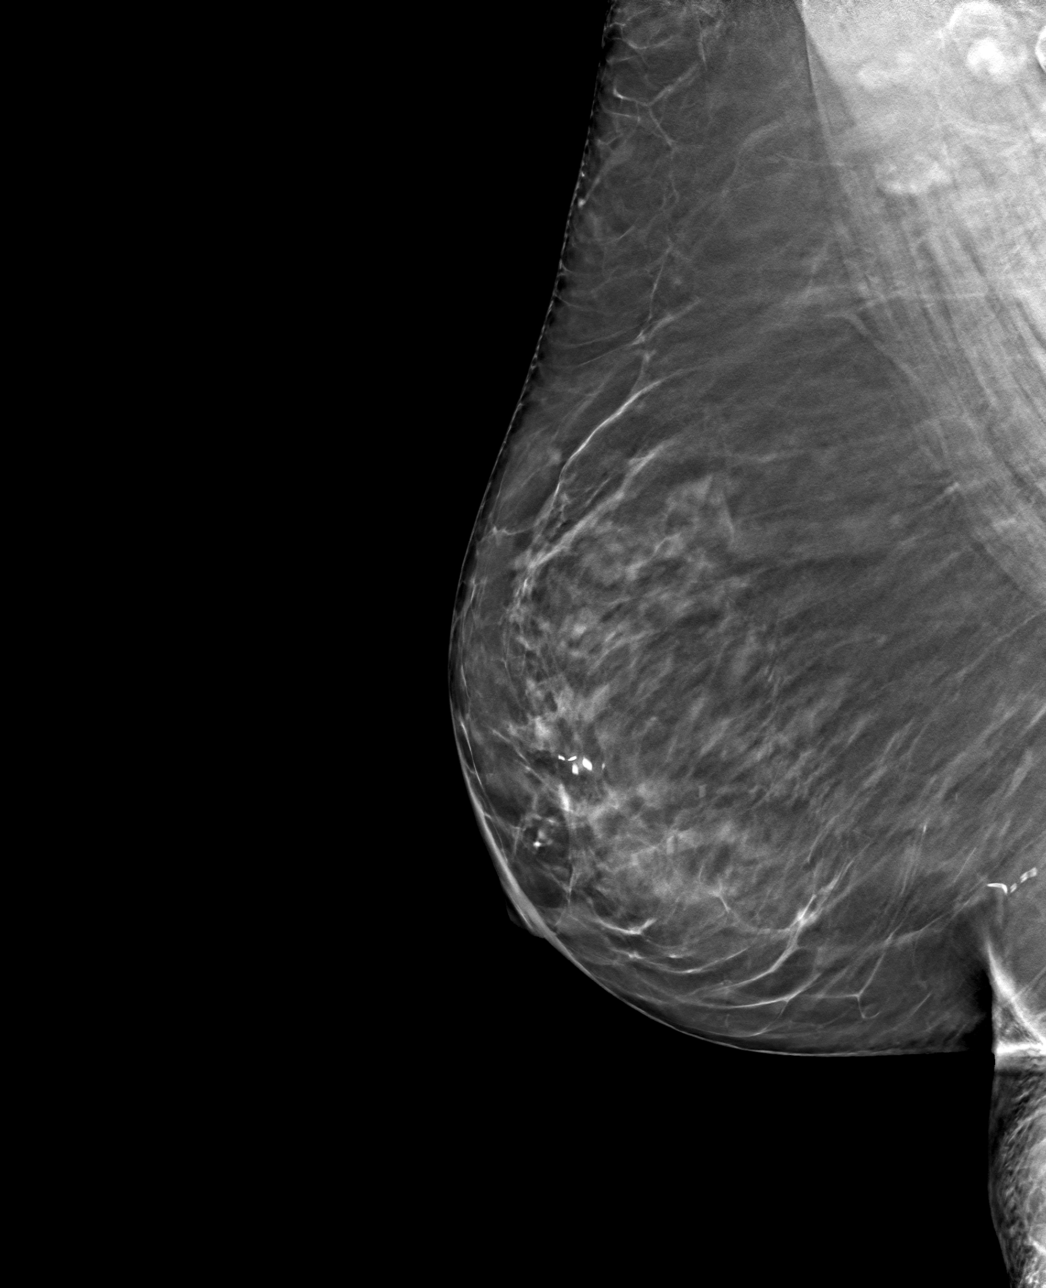

[4 of 12 positions shown; findings below may reference images not displayed]

ACR Breast Density Category b: There are scattered areas of
fibroglandular density.
FINDINGS: No focal or suspicious mammographic findings are identified within
the right breast. The parenchymal pattern is stable. Radiopaque BB
at the site of the patient's palpable lump is not included on the
current mammographic views due to the far medial location.

On physical exam, I palpate no suspicious lumps along the medial
right chest wall.

Targeted ultrasound is performed, showing no focal or suspicious
sonographic abnormality along the medial right chest wall. The
patient is question palpable lump may be related to an underlying
rib at the 1 o'clock position.
IMPRESSION: No mammographic or sonographic evidence of malignancy.

RECOMMENDATION:
1. Clinical follow-up recommended for the palpable area of concern
in the right chest wall. Any further workup should be based on
clinical grounds.
2. Routine annual screening due in [DATE].

I have discussed the findings and recommendations with the patient.
If applicable, a reminder letter will be sent to the patient
regarding the next appointment.

BI-RADS CATEGORY  1: Negative.

## 2021-05-16 NOTE — Progress Notes (Signed)
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  Heterozygote factor V 5 Leiden.   INTERVAL HISTORY: Patient returns to clinic as an add-on for further evaluation and discussion of her MRI results.  She currently feels well and is asymptomatic.  She continues to tolerate Coumadin well and her INR is monitored by her primary care.  She denies any pain.  She has no neurologic complaints. She denies any recent fevers or illnesses. She denies any chest pain, cough, hemoptysis, or shortness of breath. She denies any nausea, vomiting, constipation or diarrhea. She has no melena or hematochezia. She has no urinary complaints.  Patient offers no specific complaints today.  REVIEW OF SYSTEMS:   Review of Systems  Constitutional: Negative.  Negative for fever, malaise/fatigue and weight loss.  Respiratory: Negative.  Negative for cough and shortness of breath.   Cardiovascular: Negative.  Negative for chest pain and leg swelling.  Gastrointestinal: Negative.  Negative for abdominal pain, blood in stool and melena.  Genitourinary: Negative.  Negative for hematuria.  Musculoskeletal: Negative.  Negative for back pain.  Skin: Negative.  Negative for rash.  Neurological: Negative.  Negative for focal weakness, weakness and headaches.  Endo/Heme/Allergies:  Does not bruise/bleed easily.  Psychiatric/Behavioral: Negative.  The patient is not nervous/anxious.    As per HPI. Otherwise, a complete review of systems is negative.  PAST MEDICAL HISTORY: Past Medical History:  Diagnosis Date   Actinic keratosis 06/29/2006   R pretibial mid, and med (bx proven)   Atrial fibrillation ([place])    Breast cancer  ([place]) 08/2013   ER positive adenocarcinoma of Left Breast. 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 01/30/2010   R inf pretibial - ED&C    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 04/14/2016   L mid med pretibial    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 05/20/2016   L mid to distal med pretibial    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 07/20/2016   L mid med pretibial    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 05/10/2017   L mid pretibial    Personal history of chemotherapy    1 round   Personal history of radiation therapy    Skin cancer    squamous cell   Squamous cell carcinoma of skin 03/24/2006   L sup pretibial    Squamous cell carcinoma of skin 03/24/2006   R pretibial inf    Squamous cell carcinoma of skin 05/25/2006   L lat mid lower leg    Squamous cell carcinoma of skin 06/29/2006   R pretibial lat    Squamous  cell carcinoma of skin 10/17/2012   R prox pretibial ED&C   Squamous cell carcinoma of skin 12/19/2012   R prox sup pretibial    Squamous cell carcinoma of skin 02/03/2016   L med mid pretibial    Squamous cell carcinoma of skin 08/20/2016   L pretibial sup    Squamous cell carcinoma of skin 08/20/2016   L pretibial inf    Squamous cell carcinoma of skin 10/10/2018   L prox med pretibial    Squamous cell carcinoma of skin 06/29/2019   L calf     PAST SURGICAL  HISTORY: Past Surgical History:  Procedure Laterality Date   BREAST BIOPSY Left 09/04/2013   negative stereo bx   BREAST BIOPSY Left 08/25/2013   postive [place] core bx   BREAST LUMPECTOMY Left 08/2017   BREAST LUMPECTOMY W/ NEEDLE LOCALIZATION Left 2015   ESOPHAGOGASTRODUODENOSCOPY (EGD) WITH PROPOFOL N/A 08/05/2018   Procedure: ESOPHAGOGASTRODUODENOSCOPY (EGD) WITH PROPOFOL;  Surgeon: Lucilla Lame, MD;  Location: [facility] ENDOSCOPY;  Service: Endoscopy;  Laterality: N/A;   GANGLION CYST EXCISION     OPEN REDUCTION INTERNAL FIXATION (ORIF) DISTAL RADIAL FRACTURE Left 07/11/2019   Procedure: OPEN REDUCTION INTERNAL FIXATION (ORIF) DISTAL RADIAL FRACTURE;  Surgeon: Hessie Knows, MD;  Location: [facility] ORS;  Service: Orthopedics;  Laterality: Left;    FAMILY HISTORY Family History  Problem Relation Age of Onset   Cancer Father        colon   CAD Father    Hypertension Father    Hyperlipidemia Father    CAD Mother    Breast cancer Neg Hx        ADVANCED DIRECTIVES:    HEALTH MAINTENANCE: Social History   Tobacco Use   Smoking status: Former   Smokeless tobacco: Never  Substance Use Topics   Alcohol use: Yes    Comment: social   Drug use: No     Colonoscopy:  PAP:  Bone density:  Lipid panel:  Allergies  Allergen Reactions   Metoprolol Shortness Of Breath   Other Other (See Comments)    Sodium pentothal  Causes blood clots    Current Outpatient Medications  Medication Sig Dispense Refill   alendronate (FOSAMAX) 70 MG tablet TAKE ONE TABLET BY MOUTH ONCE WEEKLY. TAKE WITH A FULL GLASS OF WATER ON AN EMPTY STOMACH 12 tablet 3   busPIRone (BUSPAR) 10 MG tablet Take 10 mg by mouth daily.      citalopram (CELEXA) 20 MG tablet Take 20 mg by mouth daily.      diltiazem (TIAZAC) 240 MG 24 hr capsule Take 240 mg by mouth daily.      pravastatin (PRAVACHOL) 40 MG tablet Take 40 mg by mouth daily.      triamterene-hydrochlorothiazide (MAXZIDE-25) 37.5-25 MG per tablet Take 1 tablet  by mouth daily.      warfarin (COUMADIN) 5 MG tablet Take 5 mg by mouth See admin instructions. Takes along with a 2mg  to equal 7mg  daily     aspirin 81 MG EC tablet Take by mouth. (Patient not taking: Reported on 05/20/2021)     LORazepam (ATIVAN) 1 MG tablet Take 1 mg by mouth daily as needed for anxiety.  (Patient not taking: Reported on 05/20/2021)     Multiple Vitamins-Minerals (MULTIVITAMIN WITH MINERALS) tablet Take 1 tablet by mouth daily. Alive (Patient not taking: Reported on 05/20/2021) 90 tablet 0   pantoprazole (PROTONIX) 40 MG tablet Take 1 tablet (40 mg total) by mouth daily. 30 tablet 11  warfarin (COUMADIN) 2 MG tablet Take 2 mg by mouth See admin instructions. Take with 5 mg for a total of 7 mg     No current facility-administered medications for this visit.    OBJECTIVE: Vitals:   05/20/21 0947  BP: (!) 174/87  Resp: 18  Temp: 98.4 F (36.9 C)  SpO2: 96%     Body mass index is 32.25 kg/m.    ECOG FS:0 - Asymptomatic  General: Well-developed, well-nourished, no acute distress. Eyes: Pink conjunctiva, anicteric sclera. HEENT: Normocephalic, moist mucous membranes. Breast: Exam deferred today.   Lungs: No audible wheezing or coughing. Heart: Regular rate and rhythm. Abdomen: Soft, nontender, no obvious distention. Musculoskeletal: No edema, cyanosis, or clubbing. Neuro: Alert, answering all questions appropriately. Cranial nerves grossly intact. Skin: No rashes or petechiae noted. Psych: Normal affect.  LAB RESULTS:  Lab Results  Component Value Date   NA 135 05/04/2021   K 4.3 05/04/2021   CL 99 05/04/2021   CO2 27 05/04/2021   GLUCOSE 151 (H) 05/04/2021   BUN 10 05/04/2021   CREATININE 0.81 05/04/2021   CALCIUM 8.6 (L) 05/04/2021   PROT 8.1 05/04/2021   ALBUMIN 3.8 05/04/2021   AST 21 05/04/2021   ALT 17 05/04/2021   ALKPHOS 63 05/04/2021   BILITOT 1.9 (H) 05/04/2021   GFRNONAA >60 05/04/2021   GFRAA >60 07/11/2019    Lab Results  Component  Value Date   WBC 7.7 05/04/2021   NEUTROABS 7.2 05/03/2021   HGB 14.5 05/04/2021   HCT 44.5 05/04/2021   MCV 85.7 05/04/2021   PLT 193 05/04/2021     STUDIES: DG Chest 2 View  Result Date: 05/03/2021 CLINICAL DATA:  Cough, fever and sore throat which began yesterday. Wheezing upon arrival. EXAM: CHEST - 2 VIEW COMPARISON:  08/05/2018. FINDINGS: Heart size normal. Aortic atherosclerosis. Blunting of the left costophrenic angle may reflect chronic pleuroparenchymal scarring versus small effusion. Diffuse pulmonary vascular congestion. No airspace consolidation. Visualized osseous structures appear intact. IMPRESSION: Pulmonary vascular congestion. Electronically Signed   By: Kerby Moors M.D.   On: 05/03/2021 14:30   CT Angio Chest PE W/Cm &/Or Wo Cm  Result Date: 05/03/2021 CLINICAL DATA:  Pulmonary embolism suspected in a 76 year old female. EXAM: CT ANGIOGRAPHY CHEST WITH CONTRAST TECHNIQUE: Multidetector CT imaging of the chest was performed using the standard protocol during bolus administration of intravenous contrast. Multiplanar CT image reconstructions and MIPs were obtained to evaluate the vascular anatomy. RADIATION DOSE REDUCTION: This exam was performed according to the departmental dose-optimization program which includes automated exposure control, adjustment of the mA and/or kV according to patient size and/or use of iterative reconstruction technique. CONTRAST:  32mL OMNIPAQUE IOHEXOL 350 MG/ML SOLN COMPARISON:  Comparison is made with chest x-ray of the same date. FINDINGS: Cardiovascular: Calcified and noncalcified atheromatous plaque of the thoracic aorta without aneurysmal dilation. Normal caliber of central pulmonary vessels. Heart size moderately enlarged. Central pulmonary arteries are well opacified with a maximal attenuation of 381 Hounsfield units. Exam mildly limited by motion related artifact. Negative for pulmonary embolism. Mediastinum/Nodes: Esophagus grossly normal.  No thoracic inlet lymphadenopathy. No mediastinal lymphadenopathy. No hilar lymphadenopathy. No axillary lymphadenopathy. Lungs/Pleura: Basilar atelectasis. No effusion. No dense consolidation. Mild mosaic attenuation at the lung bases. LEFT upper lobe pulmonary nodule (image 44/9) this measures approximately 5 mm potentially bandlike but with motion artifact this shows limited assessment. The airways are patent into the chest. Upper Abdomen: Incidental imaging of upper abdominal contents shows no acute process. Lobular hepatic  contours and signs of mild to moderate splenomegaly. Musculoskeletal: No acute musculoskeletal process. Spinal degenerative changes. A 2.5 cm area adjacent to the T10 vertebral body along the RIGHT paraspinal region showing no adjacent bony changes was present in May of 2020 and is slightly larger previously measuring 2 cm. This shows low-density. Review of the MIP images confirms the above findings. IMPRESSION: 1. Negative for pulmonary embolism. 2. LEFT upper lobe pulmonary nodule measures approximately 5 mm potentially bandlike but with motion artifact this shows limited assessment. No follow-up needed if patient is low-risk. Non-contrast chest CT can be considered in 12 months if patient is high-risk. This recommendation follows the consensus statement: Guidelines for Management of Incidental Pulmonary Nodules Detected on CT Images: From the Fleischner Society 2017; Radiology 2017; 284:228-243. 3. Low-attenuation along the RIGHT paraspinal region is of uncertain significance potentially related to lymphatic malformation or even atypical location for thoracic duct. Consider follow-up spinal MRI however in this patient with history of breast cancer for definitive assessment assessment. Behavior is in keeping with an indolent or benign process. 4. Mild mosaic attenuation at the lung bases, nonspecific, can be seen in the setting of small airways disease. 5. Lobular hepatic contours with  mildly nodular surface of the liver and with mild splenomegaly, correlate with signs of liver disease. 6. Aortic atherosclerosis. Aortic Atherosclerosis (ICD10-I70.0). Electronically Signed   By: Zetta Bills M.D.   On: 05/03/2021 18:17   MR Thoracic Spine W Wo Contrast  Result Date: 05/18/2021 CLINICAL DATA:  Primary cancer of lower outer quadrant of left female breast ([place]) C50.512 (ICD-10-CM) T10 lesion EXAM: MRI THORACIC WITHOUT AND WITH CONTRAST TECHNIQUE: Multiplanar and multiecho pulse sequences of the thoracic spine were obtained without and with intravenous contrast. CONTRAST:  59mL GADAVIST GADOBUTROL 1 MMOL/ML IV SOLN COMPARISON:  Chest CT 05/03/2021 in CT abdomen pelvis 08/04/2018 FINDINGS: Alignment:  Physiologic. Vertebrae: No fracture, evidence of discitis, or bone lesion. Cord:  Normal signal and morphology. Paraspinal and other soft tissues: To the right of the T10 vertebral body is a soft tissue mass measuring 2.4 x 1.0 cm (axial image 31). Paraspinal soft tissues are otherwise normal. Disc levels: There is no spinal canal or neural foraminal stenosis. No disc herniation. IMPRESSION: 1. A 2.4 x 1.0 cm soft tissue mass adjacent to the T10 vertebral body, with mild enhancement. Given stability in size since 08/04/2018, this is most likely a benign process such as a nerve sheath tumor or focus of extramedullary hematopoiesis. Metastatic disease is unlikely. 2. No spinal canal or neural foraminal stenosis. Electronically Signed   By: Ulyses Jarred M.D.   On: 05/18/2021 19:59   US BREAST LTD UNI RIGHT INC AXILLA  Result Date: 05/15/2021 CLINICAL DATA:  76 year old female with a palpable lump along the medial right chest wall. EXAM: DIGITAL DIAGNOSTIC UNILATERAL RIGHT MAMMOGRAM WITH TOMOSYNTHESIS AND CAD; ULTRASOUND RIGHT BREAST LIMITED TECHNIQUE: Right digital diagnostic mammography and breast tomosynthesis was performed. The images were evaluated with computer-aided detection.; Targeted  ultrasound examination of the right breast was performed COMPARISON:  Previous exam(s). ACR Breast Density Category b: There are scattered areas of fibroglandular density. FINDINGS: No focal or suspicious mammographic findings are identified within the right breast. The parenchymal pattern is stable. Radiopaque BB at the site of the patient's palpable lump is not included on the current mammographic views due to the far medial location. On physical exam, I palpate no suspicious lumps along the medial right chest wall. Targeted ultrasound is performed, showing no focal or  suspicious sonographic abnormality along the medial right chest wall. The patient is question palpable lump may be related to an underlying rib at the 1 o'clock position. IMPRESSION: No mammographic or sonographic evidence of malignancy. RECOMMENDATION: 1. Clinical follow-up recommended for the palpable area of concern in the right chest wall. Any further workup should be based on clinical grounds. 2. Routine annual screening due in July 2023. I have discussed the findings and recommendations with the patient. If applicable, a reminder letter will be sent to the patient regarding the next appointment. BI-RADS CATEGORY  1: Negative. Electronically Signed   By: Kristopher Oppenheim M.D.   On: 05/15/2021 10:13  MM DIAG BREAST TOMO UNI RIGHT  Result Date: 05/15/2021 CLINICAL DATA:  76 year old female with a palpable lump along the medial right chest wall. EXAM: DIGITAL DIAGNOSTIC UNILATERAL RIGHT MAMMOGRAM WITH TOMOSYNTHESIS AND CAD; ULTRASOUND RIGHT BREAST LIMITED TECHNIQUE: Right digital diagnostic mammography and breast tomosynthesis was performed. The images were evaluated with computer-aided detection.; Targeted ultrasound examination of the right breast was performed COMPARISON:  Previous exam(s). ACR Breast Density Category b: There are scattered areas of fibroglandular density. FINDINGS: No focal or suspicious mammographic findings are identified  within the right breast. The parenchymal pattern is stable. Radiopaque BB at the site of the patient's palpable lump is not included on the current mammographic views due to the far medial location. On physical exam, I palpate no suspicious lumps along the medial right chest wall. Targeted ultrasound is performed, showing no focal or suspicious sonographic abnormality along the medial right chest wall. The patient is question palpable lump may be related to an underlying rib at the 1 o'clock position. IMPRESSION: No mammographic or sonographic evidence of malignancy. RECOMMENDATION: 1. Clinical follow-up recommended for the palpable area of concern in the right chest wall. Any further workup should be based on clinical grounds. 2. Routine annual screening due in July 2023. I have discussed the findings and recommendations with the patient. If applicable, a reminder letter will be sent to the patient regarding the next appointment. BI-RADS CATEGORY  1: Negative. Electronically Signed   By: Kristopher Oppenheim M.D.   On: 05/15/2021 10:13  ECHOCARDIOGRAM COMPLETE  Result Date: 05/04/2021    ECHOCARDIOGRAM REPORT   Patient Name:   DOREA DUFF Date of Exam: 05/04/2021 Medical Rec #:  [id number]     Height:       66.0 in Accession #:    [id number]    Weight:       200.2 lb Date of Birth:  1946-01-07      BSA:          2.001 m Patient Age:    44 years      BP:           138/79 mmHg Patient Gender: F             HR:           130 bpm. Exam Location:  [facility] Procedure: 2D Echo and Intracardiac Opacification Agent Indications:     Diastolic CHF  History:         Patient has no prior history of Echocardiogram examinations.                  Arrythmias:Atrial Fibrillation; Risk Factors:Hypertension,                  Diabetes and Pulm Embolism.  Sonographer:     L Thornton-Maynard Referring Phys:  [id number] [place]  Diagnosing Phys: Isaias Cowman MD  Sonographer Comments: Suboptimal apical window. IMPRESSIONS  1. Left  ventricular ejection fraction, by estimation, is 50 to 55%. The left ventricle has low normal function. The left ventricle has no regional wall motion abnormalities. Left ventricular diastolic parameters are indeterminate.  2. Right ventricular systolic function is normal. The right ventricular size is normal. There is mildly elevated pulmonary artery systolic pressure.  3. The mitral valve is normal in structure. Mild mitral valve regurgitation. No evidence of mitral stenosis.  4. The aortic valve is normal in structure. Aortic valve regurgitation is not visualized. No aortic stenosis is present.  5. The inferior vena cava is normal in size with greater than 50% respiratory variability, suggesting right atrial pressure of 3 mmHg. FINDINGS  Left Ventricle: Left ventricular ejection fraction, by estimation, is 50 to 55%. The left ventricle has low normal function. The left ventricle has no regional wall motion abnormalities. Definity contrast agent was given IV to delineate the left ventricular endocardial borders. The left ventricular internal cavity size was normal in size. There is no left ventricular hypertrophy. Left ventricular diastolic parameters are indeterminate. Right Ventricle: The right ventricular size is normal. No increase in right ventricular wall thickness. Right ventricular systolic function is normal. There is mildly elevated pulmonary artery systolic pressure. The tricuspid regurgitant velocity is 2.70  m/s, and with an assumed right atrial pressure of 10 mmHg, the estimated right ventricular systolic pressure is 79.0 mmHg. Left Atrium: Left atrial size was normal in size. Right Atrium: Right atrial size was normal in size. Pericardium: There is no evidence of pericardial effusion. Mitral Valve: The mitral valve is normal in structure. Mild mitral valve regurgitation. No evidence of mitral valve stenosis. MV peak gradient, 5.4 mmHg. The mean mitral valve gradient is 3.0 mmHg. Tricuspid Valve: The  tricuspid valve is normal in structure. Tricuspid valve regurgitation is mild . No evidence of tricuspid stenosis. Aortic Valve: The aortic valve is normal in structure. Aortic valve regurgitation is not visualized. No aortic stenosis is present. Aortic valve mean gradient measures 4.0 mmHg. Aortic valve peak gradient measures 6.6 mmHg. Aortic valve area, by VTI measures 1.61 cm. Pulmonic Valve: The pulmonic valve was normal in structure. Pulmonic valve regurgitation is not visualized. No evidence of pulmonic stenosis. Aorta: The aortic root is normal in size and structure. Venous: The inferior vena cava is normal in size with greater than 50% respiratory variability, suggesting right atrial pressure of 3 mmHg. IAS/Shunts: No atrial level shunt detected by color flow Doppler.  LEFT VENTRICLE PLAX 2D LVIDd:         4.33 cm     Diastology LVIDs:         3.39 cm     LV e' medial:    8.59 cm/s LV PW:         1.03 cm     LV E/e' medial:  11.1 LV IVS:        1.07 cm     LV e' lateral:   9.68 cm/s LVOT diam:     1.70 cm     LV E/e' lateral: 9.8 LV SV:         32 LV SV Index:   16 LVOT Area:     2.27 cm  LV Volumes (MOD) LV vol d, MOD A2C: 54.8 ml LV vol d, MOD A4C: 73.6 ml LV vol s, MOD A2C: 21.1 ml LV vol s, MOD A4C: 21.9 ml LV SV MOD A2C:  33.7 ml LV SV MOD A4C:     73.6 ml LV SV MOD BP:      44.5 ml RIGHT VENTRICLE RV S prime:     9.03 cm/s TAPSE (M-mode): 1.3 cm LEFT ATRIUM           Index        RIGHT ATRIUM           Index LA diam:      3.00 cm 1.50 cm/m   RA Area:     15.40 cm LA Vol (A2C): 43.3 ml 21.64 ml/m  RA Volume:   35.00 ml  17.50 ml/m LA Vol (A4C): 81.7 ml 40.84 ml/m  AORTIC VALVE                    PULMONIC VALVE AV Area (Vmax):    1.33 cm     PV Vmax:       1.04 m/s AV Area (Vmean):   1.36 cm     PV Peak grad:  4.3 mmHg AV Area (VTI):     1.61 cm AV Vmax:           128.00 cm/s AV Vmean:          88.800 cm/s AV VTI:            0.199 m AV Peak Grad:      6.6 mmHg AV Mean Grad:      4.0 mmHg  LVOT Vmax:         74.80 cm/s LVOT Vmean:        53.200 cm/s LVOT VTI:          0.141 m LVOT/AV VTI ratio: 0.71  AORTA Ao Root diam: 2.90 cm Ao Asc diam:  3.10 cm MITRAL VALVE               TRICUSPID VALVE MV Area (PHT): 4.74 cm    TR Peak grad:   29.2 mmHg MV Area VTI:   1.78 cm    TR Vmax:        270.00 cm/s MV Peak grad:  5.4 mmHg MV Mean grad:  3.0 mmHg    SHUNTS MV Vmax:       1.16 m/s    Systemic VTI:  0.14 m MV Vmean:      78.8 cm/s   Systemic Diam: 1.70 cm MV Decel Time: 160 msec MV E velocity: 95.20 cm/s Isaias Cowman MD Electronically signed by Isaias Cowman MD Signature Date/Time: 05/04/2021/1:27:01 PM    Final     ASSESSMENT: 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  Heterozygote factor V 5 Leiden.  PLAN:    1. 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 She received one cycle of Taxotere and Cytoxan, but secondary to significant toxicity this was discontinued.  Because of her increased Oncotype score, patient agreed to take a total of 7 years of letrozole which she completed in April 2022.  Her most recent unilateral right diagnostic mammogram on May 15, 2021 was reported BI-RADS 2.  Patient will require a bilateral screening mammogram in July 2023.  Return to clinic in 1 year after her mammogram for routine evaluation.   2. Heterozygote factor V 5 Leiden: Patient was diagnosed with PE and DVT at an outside hospital. She completed  6 months of treatment with Xarelto, but given her increased risk she elected to stay on anticoagulation lifelong.  She is now on Coumadin and INR is managed by her primary care physician.  Goal INR is 2.0-3.0.     3. Osteopenia: Patient's most recent bone mineral density on October 08, 2020 reported T score of -2.2 which is slightly improved over 1  year prior.  Continue Fosamax, calcium, and vitamin D.  This is now managed by primary care.   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  T10 soft tissue mass is essentially unchanged since at least May 2020.  Most likely benign process.  No further imaging is necessary.    Patient expressed understanding and was in agreement with this plan. She also understands that She can call clinic at any time with any questions, concerns, or complaints.   Breast cancer   Staging form: Breast, AJCC 7th Edition     Clinical stage from 09/22/2014: Stage IA (T1b, N0, M0) - Signed by Lloyd Huger, MD on 09/22/2014   Lloyd Huger, MD   05/20/2021 10:14 AM

## 2021-05-17 ENCOUNTER — Ambulatory Visit
Admission: RE | Admit: 2021-05-17 | Discharge: 2021-05-17 | Disposition: A | Discharge status: Home / Self Care | Payer: Medicare HMO | Source: Ambulatory Visit | Attending: Oncology | Admitting: Oncology

## 2021-05-17 ENCOUNTER — Other Ambulatory Visit: Payer: Self-pay

## 2021-05-17 DIAGNOSIS — C50512 Malignant neoplasm of lower-outer quadrant of left female breast: Secondary | ICD-10-CM | POA: Diagnosis present

## 2021-05-17 IMAGING — MR MR THORACIC SPINE WO/W CM
6 of 9 series · 28 of 48 positions shown · IV contrast (gadavist)
Comparison: Chest CT [DATE] in CT abdomen pelvis [DATE]

CLINICAL DATA: Primary cancer of lower outer quadrant of left
female breast (HCC) [HE] ([HE]-CM) T10 lesion

EXAM:
MRI THORACIC WITHOUT AND WITH CONTRAST
TECHNIQUE: Multiplanar and multiecho pulse sequences of the thoracic spine were
obtained without and with intravenous contrast.
CONTRAST:  9mL GADAVIST GADOBUTROL 1 MMOL/ML IV SOLN

[Series 18: T1 · sagittal · 6.0mm · 1.41mm/px · 1 of 9 slices shown (1 of 3)]
[im 1/9]
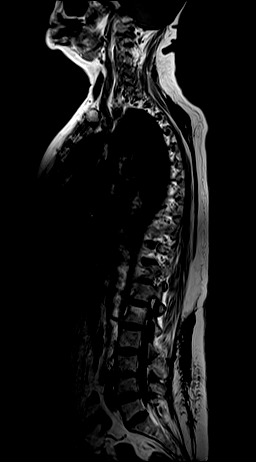

[Series 19: T2 · sagittal · 3.0mm · 1.06mm/px · 4 of 20 slices shown (1 of 2)]
[im 1/20]
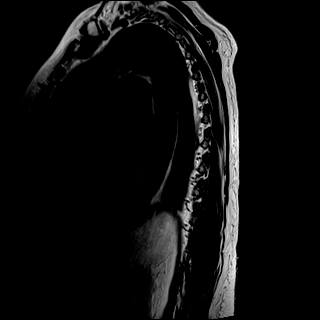
[im 7/20]
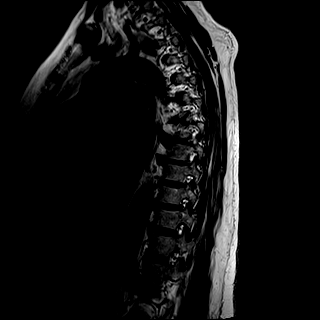
[im 13/20]
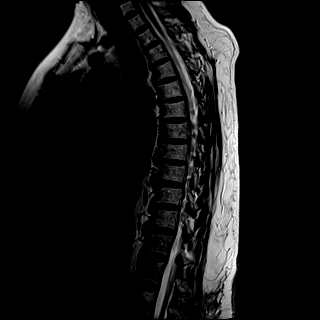
[im 20/20]
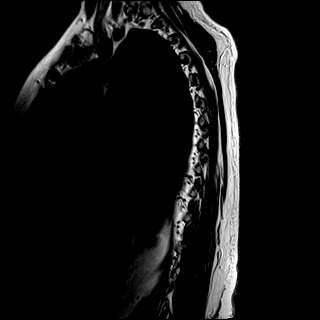

[Series 20: T1 · sagittal · 3.0mm · 1.06mm/px · 4 of 20 slices shown (2 of 3)]
[im 1/20]
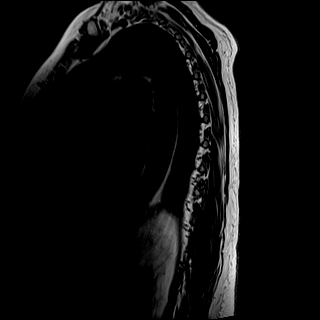
[im 7/20]
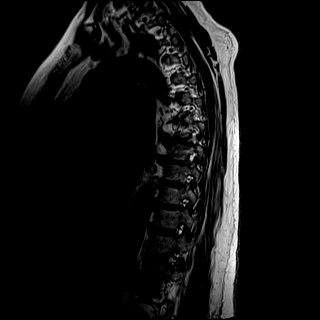
[im 13/20]
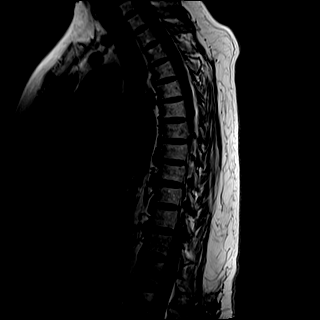
[im 20/20]
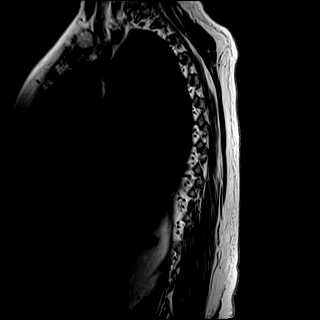

[Series 22: T2 · axial · 4.0mm · 0.59mm/px · z∈[-330,-139]mm · 7 of 39 slices shown (2 of 2)]
[im 1/39]
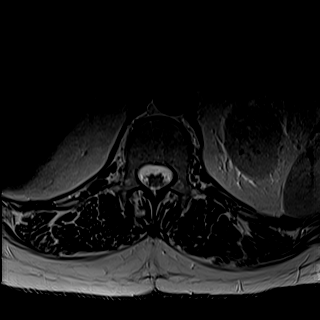
[im 7/39]
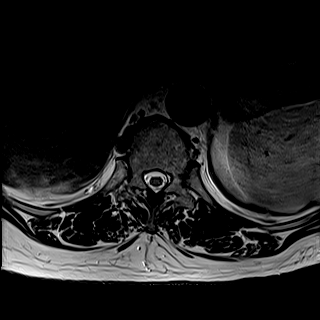
[im 13/39]
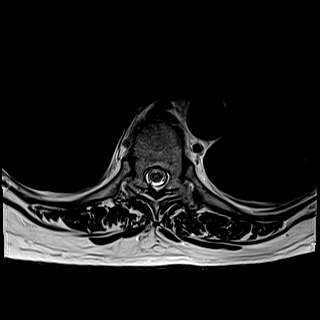
[im 20/39]
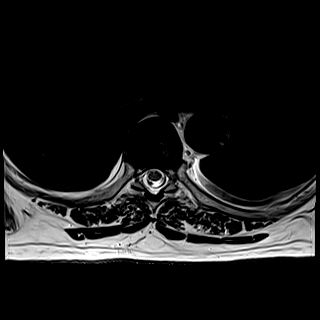
[im 26/39]
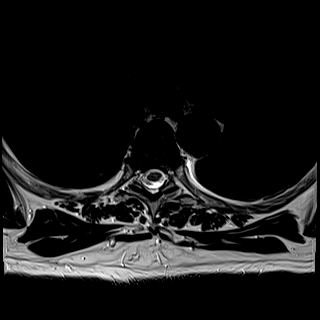
[im 32/39]
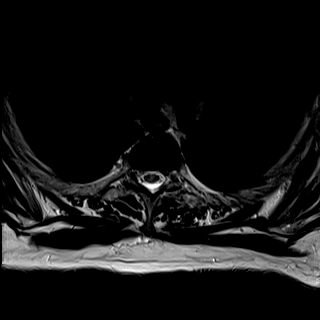
[im 39/39]
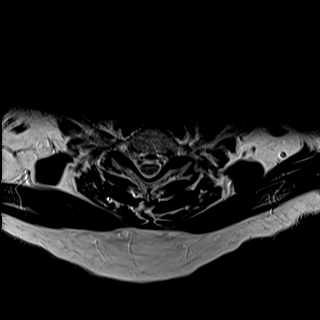

[Series 24: T1 · axial · non-contrast · 4.0mm · 0.31mm/px · z∈[-330,-139]mm · 8 of 41 slices shown (3 of 3)]
[im 1/41]
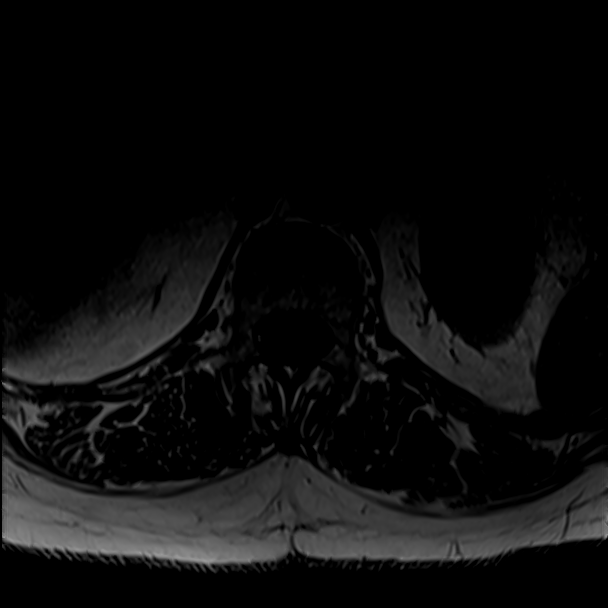
[im 6/41]
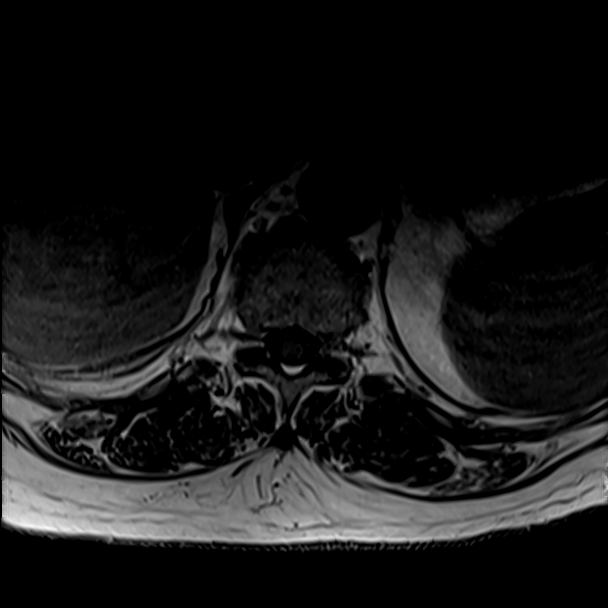
[im 12/41]
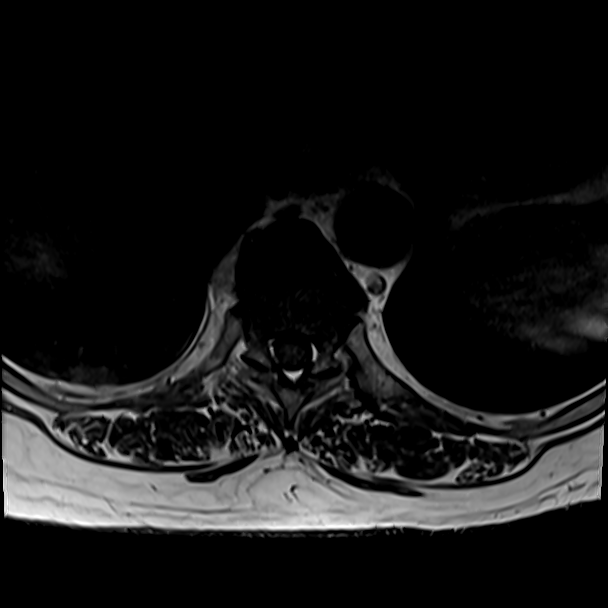
[im 18/41]
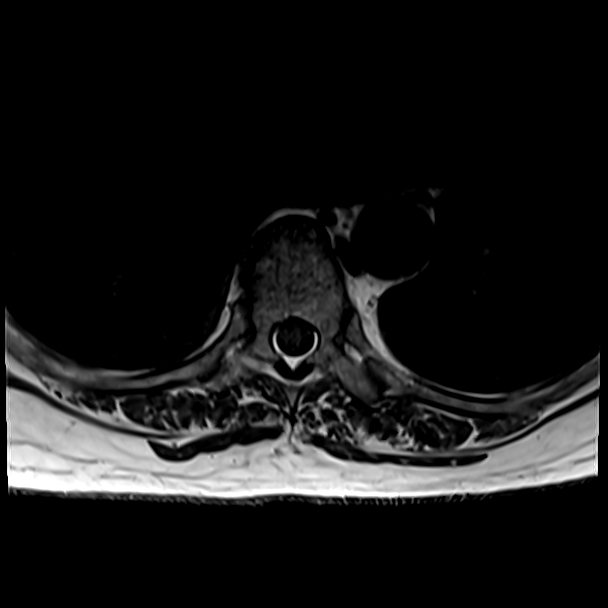
[im 23/41]
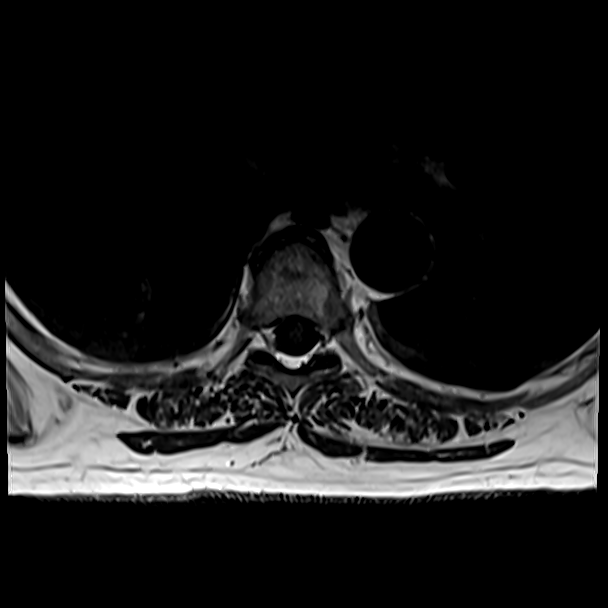
[im 29/41]
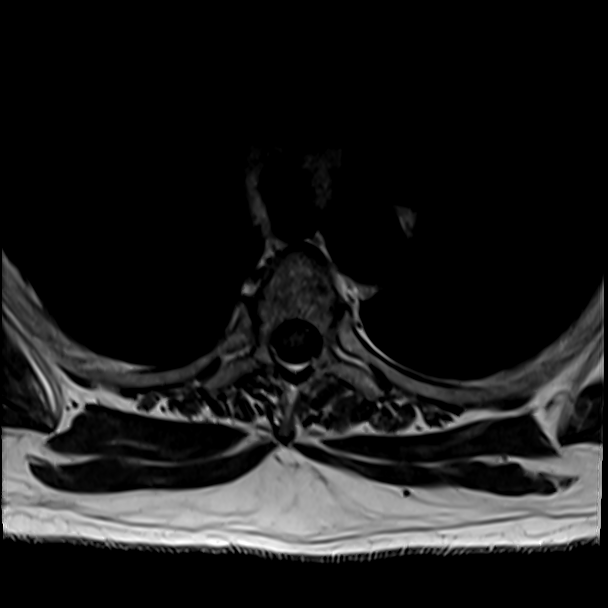
[im 35/41]
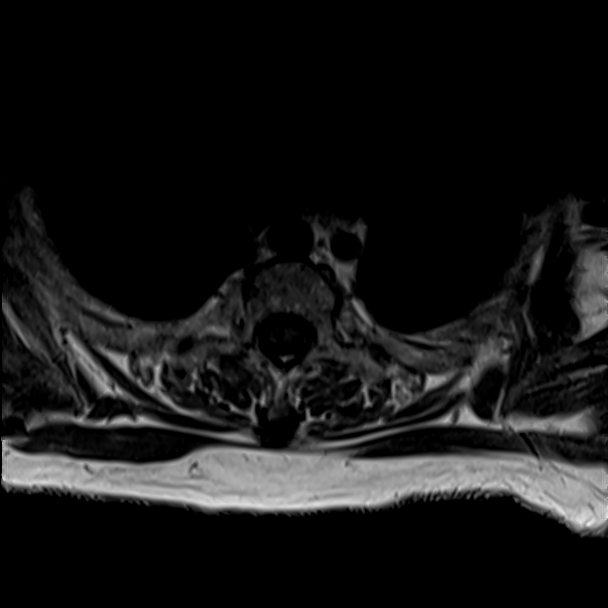
[im 41/41]
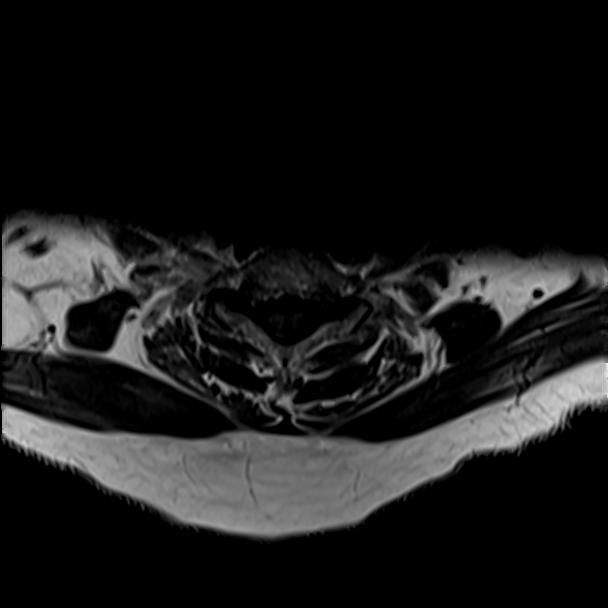

[Series 26: T1 fat-sat post-contrast · sagittal · 3.0mm · 1.06mm/px · 4 of 20 slices shown]
[im 1/20]
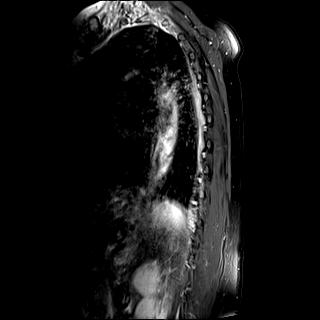
[im 7/20]
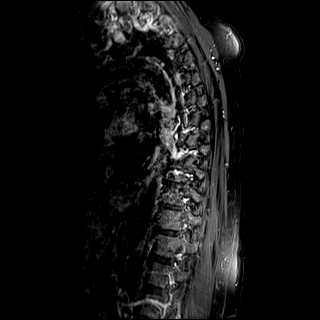
[im 13/20]
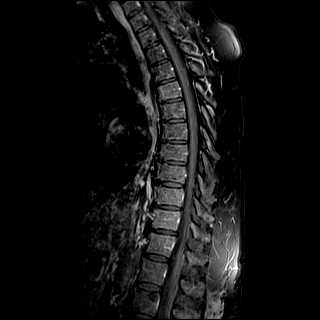
[im 20/20]
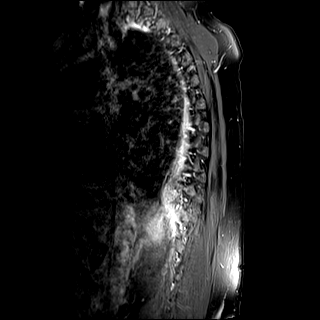

[28 of 48 positions shown; findings below may reference images not displayed]

FINDINGS: Alignment:  Physiologic.

Vertebrae: No fracture, evidence of discitis, or bone lesion.

Cord:  Normal signal and morphology.

Paraspinal and other soft tissues: To the right of the T10 vertebral
body is a soft tissue mass measuring 2.4 x 1.0 cm (axial image 31).
Paraspinal soft tissues are otherwise normal.

Disc levels:

There is no spinal canal or neural foraminal stenosis. No disc
herniation.
IMPRESSION: 1. A 2.4 x 1.0 cm soft tissue mass adjacent to the T10 vertebral
body, with mild enhancement. Given stability in size since
[DATE], this is most likely a benign process such as a nerve
sheath tumor or focus of extramedullary hematopoiesis. Metastatic
disease is unlikely.
2. No spinal canal or neural foraminal stenosis.

## 2021-05-17 MED ORDER — GADOBUTROL 1 MMOL/ML IV SOLN
9.0000 mL | Freq: Once | INTRAVENOUS | Status: AC | PRN
  Administered 2021-05-17: 9 mL via INTRAVENOUS

## 2021-05-20 ENCOUNTER — Other Ambulatory Visit: Payer: Self-pay

## 2021-05-20 ENCOUNTER — Ambulatory Visit
Admission: RE | Admit: 2021-05-20 | Discharge: 2021-05-20 | Disposition: A | Discharge status: Home / Self Care | Payer: Medicare HMO | Source: Ambulatory Visit | Attending: Unknown Physician Specialty | Admitting: Unknown Physician Specialty

## 2021-05-20 ENCOUNTER — Inpatient Hospital Stay: Payer: Medicare HMO | Attending: Oncology | Admitting: Oncology

## 2021-05-20 VITALS — BP 174/87 | Temp 98.4°F | Resp 18 | Wt 199.8 lb

## 2021-05-20 DIAGNOSIS — Z9221 Personal history of antineoplastic chemotherapy: Secondary | ICD-10-CM | POA: Insufficient documentation

## 2021-05-20 DIAGNOSIS — I10 Essential (primary) hypertension: Secondary | ICD-10-CM | POA: Insufficient documentation

## 2021-05-20 DIAGNOSIS — R161 Splenomegaly, not elsewhere classified: Secondary | ICD-10-CM | POA: Insufficient documentation

## 2021-05-20 DIAGNOSIS — Z86718 Personal history of other venous thrombosis and embolism: Secondary | ICD-10-CM | POA: Insufficient documentation

## 2021-05-20 DIAGNOSIS — H903 Sensorineural hearing loss, bilateral: Secondary | ICD-10-CM

## 2021-05-20 DIAGNOSIS — Z85828 Personal history of other malignant neoplasm of skin: Secondary | ICD-10-CM | POA: Insufficient documentation

## 2021-05-20 DIAGNOSIS — M8580 Other specified disorders of bone density and structure, unspecified site: Secondary | ICD-10-CM | POA: Insufficient documentation

## 2021-05-20 DIAGNOSIS — D6851 Activated protein C resistance: Secondary | ICD-10-CM | POA: Insufficient documentation

## 2021-05-20 DIAGNOSIS — C50512 Malignant neoplasm of lower-outer quadrant of left female breast: Secondary | ICD-10-CM | POA: Diagnosis present

## 2021-05-20 DIAGNOSIS — I4891 Unspecified atrial fibrillation: Secondary | ICD-10-CM | POA: Diagnosis not present

## 2021-05-20 DIAGNOSIS — Z170 Estrogen receptor positive status [ER+]: Secondary | ICD-10-CM | POA: Diagnosis not present

## 2021-05-20 DIAGNOSIS — Z923 Personal history of irradiation: Secondary | ICD-10-CM | POA: Insufficient documentation

## 2021-05-20 DIAGNOSIS — Z79899 Other long term (current) drug therapy: Secondary | ICD-10-CM | POA: Insufficient documentation

## 2021-05-20 DIAGNOSIS — J029 Acute pharyngitis, unspecified: Secondary | ICD-10-CM | POA: Diagnosis not present

## 2021-05-20 DIAGNOSIS — Z800 Family history of malignant neoplasm of digestive organs: Secondary | ICD-10-CM | POA: Insufficient documentation

## 2021-05-20 DIAGNOSIS — I700 Atherosclerosis of aorta: Secondary | ICD-10-CM | POA: Diagnosis not present

## 2021-05-20 DIAGNOSIS — E7800 Pure hypercholesterolemia, unspecified: Secondary | ICD-10-CM | POA: Insufficient documentation

## 2021-05-20 DIAGNOSIS — L570 Actinic keratosis: Secondary | ICD-10-CM | POA: Diagnosis not present

## 2021-05-20 DIAGNOSIS — Z79811 Long term (current) use of aromatase inhibitors: Secondary | ICD-10-CM | POA: Insufficient documentation

## 2021-05-20 DIAGNOSIS — Z7901 Long term (current) use of anticoagulants: Secondary | ICD-10-CM | POA: Insufficient documentation

## 2021-05-20 IMAGING — MR MR BRAIN/TEMPORAL BONE/IAC
13 of 14 series · 44 of 48 positions shown · IV contrast (multihance)
Comparison: MRI head [DATE]

CLINICAL DATA: Bilateral sensorineural hearing loss

EXAM:
MRI HEAD WITHOUT AND WITH CONTRAST
TECHNIQUE: Multiplanar, multiecho pulse sequences of the brain and surrounding
structures were obtained without and with intravenous contrast.
CONTRAST:  18mL MULTIHANCE GADOBENATE DIMEGLUMINE 529 MG/ML IV SOLN

[Series 5: T1 · sagittal · 4.0mm · 0.72mm/px · 1 of 29 slices shown (1 of 3)]
[im 1/29]
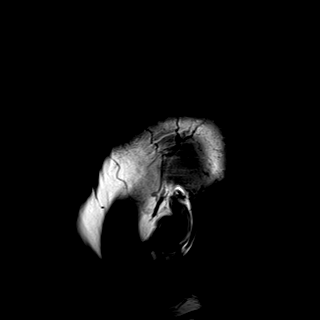

[Series 6: DWI · axial · 3.0mm · 0.94mm/px · z∈[-94,+60]mm · 10 of 175 slices shown]
[im 1/175]
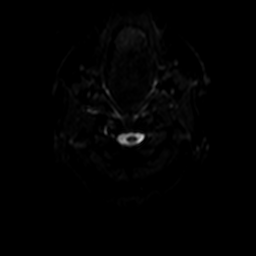
[im 20/175]
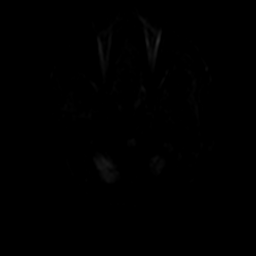
[im 39/175]
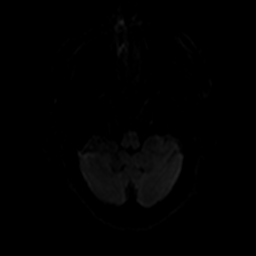
[im 59/175]
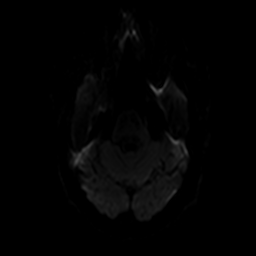
[im 78/175]
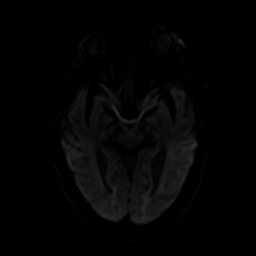
[im 97/175]
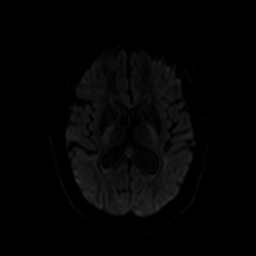
[im 117/175]
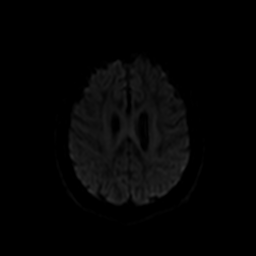
[im 136/175]
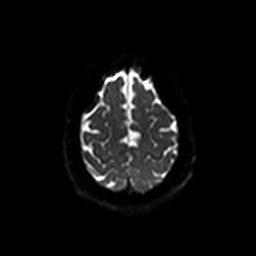
[im 155/175]
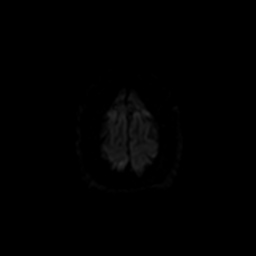
[im 175/175]
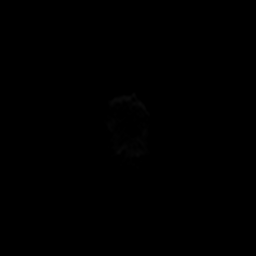

[Series 7: ax dwi_tracew · axial · 3.0mm · 0.94mm/px · z∈[-94,+60]mm · 6 of 87 slices shown]
[im 1/87]
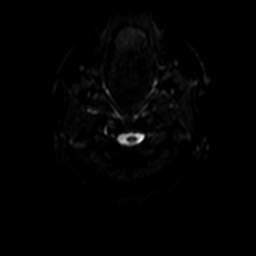
[im 18/87]
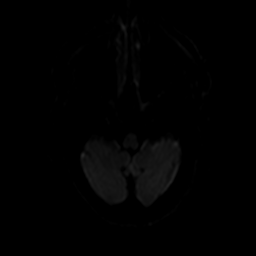
[im 35/87]
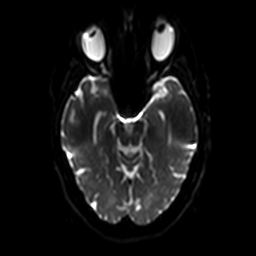
[im 52/87]
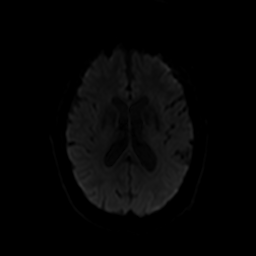
[im 69/87]
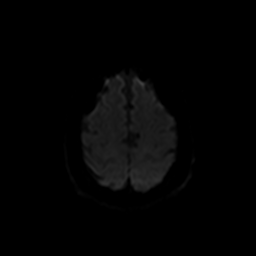
[im 87/87]
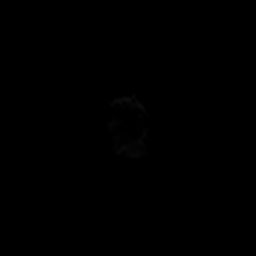

[Series 8: ax dwi_adc · axial · 3.0mm · 0.94mm/px · z∈[-94,+60]mm · 3 of 42 slices shown]
[im 1/42]
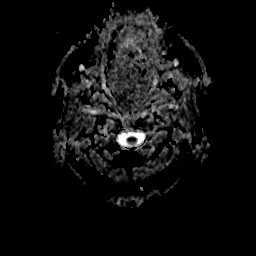
[im 21/42]
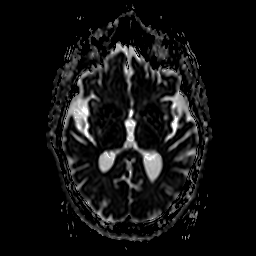
[im 42/42]
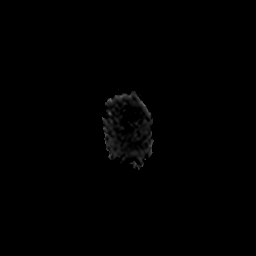

[Series 9: T2 · axial · 4.0mm · 0.36mm/px · z∈[-80,+70]mm · 2 of 30 slices shown]
[im 1/30]
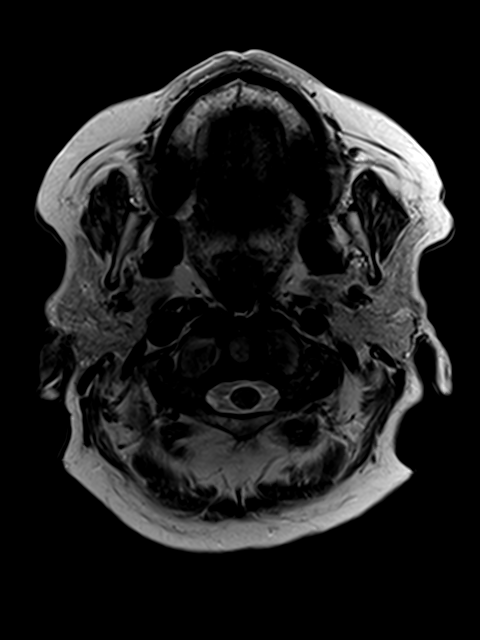
[im 30/30]
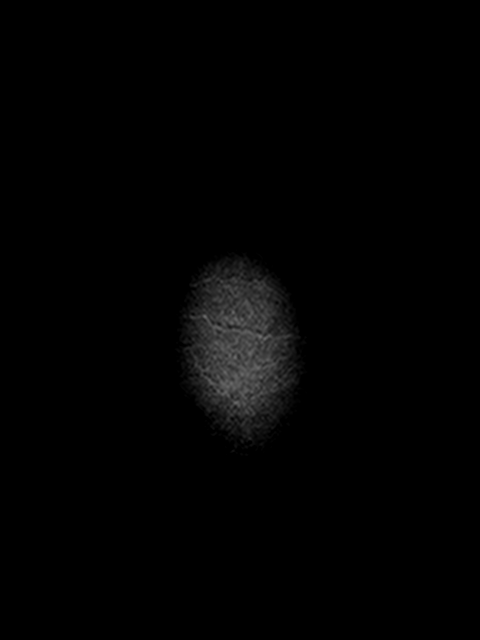

[Series 10: FLAIR · axial · 3.0mm · 0.72mm/px · z∈[-86,+75]mm · 2 of 28 slices shown]
[im 1/28]
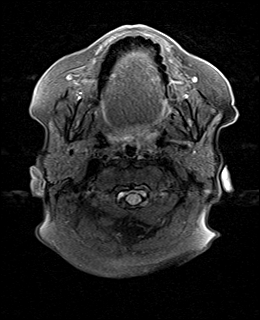
[im 28/28]
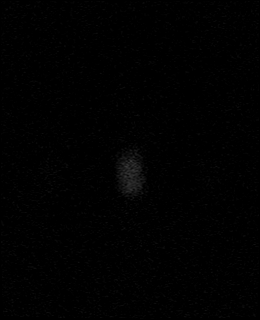

[Series 11: mip_images(sw) · axial · 24.0mm · 0.90mm/px · z∈[-71,+60]mm · 3 of 45 slices shown]
[im 1/45]
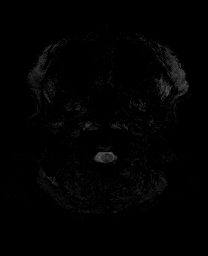
[im 23/45]
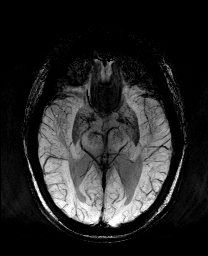
[im 45/45]
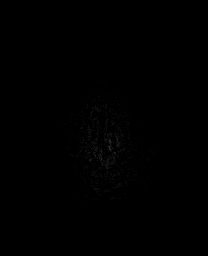

[Series 12: swi_images · axial · 3.0mm · 0.90mm/px · z∈[-81,+71]mm · 3 of 52 slices shown]
[im 1/52]
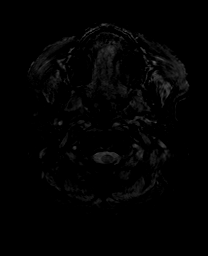
[im 26/52]
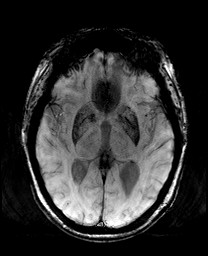
[im 52/52]
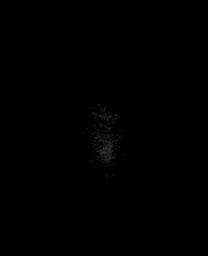

[Series 13: T1 · coronal · 3.0mm · 0.56mm/px · 1 of 13 slices shown (2 of 3)]
[im 1/13]
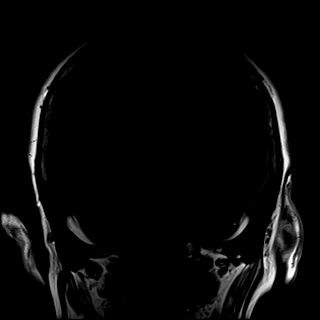

[Series 15: T1 · axial · 3.0mm · 0.50mm/px · 1 of 13 slices shown (3 of 3)]
[im 1/13]
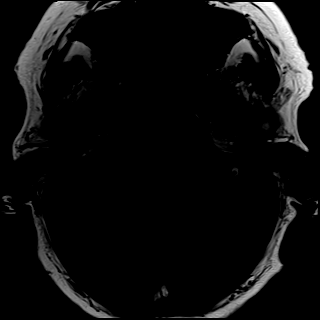

[Series 16: T1 post-contrast · coronal · 3.0mm · 0.56mm/px · 1 of 10 slices shown (1 of 3)]
[im 1/10]
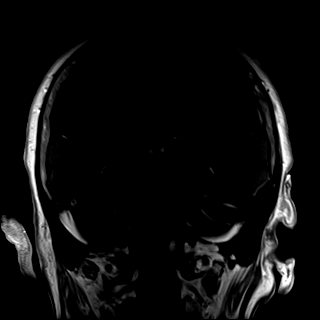

[Series 17: T1 post-contrast · axial · 3.0mm · 0.50mm/px · 1 of 13 slices shown (2 of 3)]
[im 1/13]
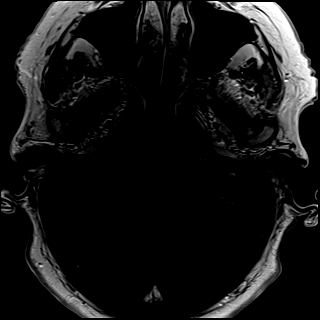

[Series 18: T1 post-contrast · axial · 1.0mm · 0.90mm/px · z∈[-84,+73]mm · 10 of 160 slices shown (3 of 3)]
[im 1/160]
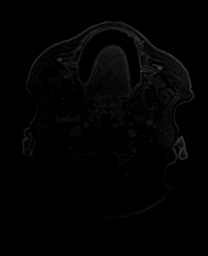
[im 18/160]
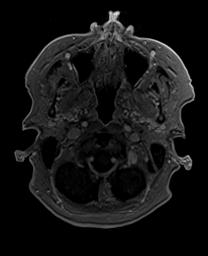
[im 36/160]
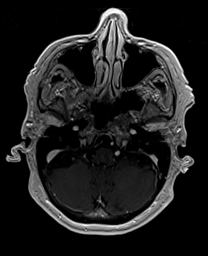
[im 54/160]
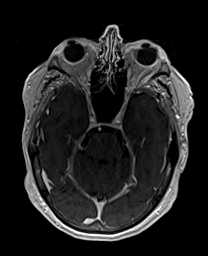
[im 71/160]
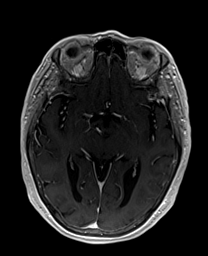
[im 89/160]
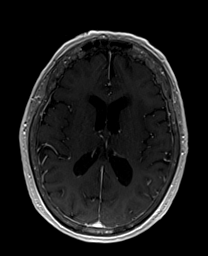
[im 107/160]
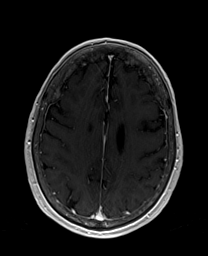
[im 124/160]
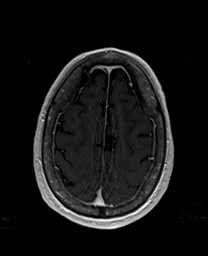
[im 142/160]
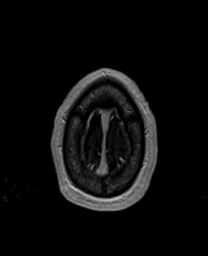
[im 160/160]
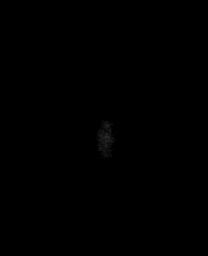

[44 of 48 positions shown; findings below may reference images not displayed]

FINDINGS: Brain: There is no cerebellopontine angle mass. There is a 5 mm
enhancing lesion along the anterior aspect of the left porous
acusticus. Probably abuts the left [O7] nerve complex. Inner ear
structures demonstrate an unremarkable MR appearance.

No acute infarction or intracranial hemorrhage. There is no mass
effect or edema. Ventricles and sulci are stable in size and
configuration. Patchy foci of T2 hyperintensity in the cerebral and
pontine white matter are nonspecific but probably reflect stable
minor chronic microvascular ischemic changes. There is no
extra-axial fluid collection. No additional abnormal enhancement.

Vascular: Major vessel flow voids at the skull base are preserved.

Skull and upper cervical spine: Normal marrow signal is preserved.

Sinuses/Orbits: Minor mucosal thickening. The orbits are
unremarkable.

Other: The sella is unremarkable. Patchy left mastoid fluid
opacification
IMPRESSION: Subcentimeter enhancing lesion along the anterior aspect of the left
porous acusticus. Probably abuts the left [O7] eighth nerve
complex. Most likely represents a meningioma.

Minor chronic microvascular ischemic changes.

Nonspecific patchy left mastoid fluid opacification.

## 2021-05-20 MED ORDER — GADOBENATE DIMEGLUMINE 529 MG/ML IV SOLN
18.0000 mL | Freq: Once | INTRAVENOUS | Status: AC | PRN
  Administered 2021-05-20: 18 mL via INTRAVENOUS

## 2021-06-19 ENCOUNTER — Ambulatory Visit: Payer: Medicare HMO | Admitting: Oncology

## 2021-07-07 ENCOUNTER — Ambulatory Visit: Payer: Medicare HMO | Admitting: Dermatology

## 2021-07-07 DIAGNOSIS — L578 Other skin changes due to chronic exposure to nonionizing radiation: Secondary | ICD-10-CM | POA: Diagnosis not present

## 2021-07-07 DIAGNOSIS — L820 Inflamed seborrheic keratosis: Secondary | ICD-10-CM

## 2021-07-07 DIAGNOSIS — Z85828 Personal history of other malignant neoplasm of skin: Secondary | ICD-10-CM | POA: Diagnosis not present

## 2021-07-07 DIAGNOSIS — L814 Other melanin hyperpigmentation: Secondary | ICD-10-CM

## 2021-07-07 DIAGNOSIS — I8393 Asymptomatic varicose veins of bilateral lower extremities: Secondary | ICD-10-CM

## 2021-07-07 DIAGNOSIS — I781 Nevus, non-neoplastic: Secondary | ICD-10-CM | POA: Diagnosis not present

## 2021-07-07 DIAGNOSIS — L821 Other seborrheic keratosis: Secondary | ICD-10-CM

## 2021-07-07 NOTE — Patient Instructions (Signed)

## 2021-07-07 NOTE — Progress Notes (Signed)
? ?Follow-Up Visit ?  ?Subjective  ?Stacy Reese is a 76 y.o. female who presents for the following: Other (Spots of right cheek and chest x ~ 6 months). ?The patient has spots, moles and lesions to be evaluated, some may be new or changing and the patient has concerns that these could be cancer. ? ?The following portions of the chart were reviewed this encounter and updated as appropriate:  ? Tobacco  Allergies  Meds  Problems  Med Hx  Surg Hx  Fam Hx   ?  ?Review of Systems:  No other skin or systemic complaints except as noted in HPI or Assessment and Plan. ? ?Objective  ?Well appearing patient in no apparent distress; mood and affect are within normal limits. ? ?A focused examination was performed including face, chest, legs, arms. Relevant physical exam findings are noted in the Assessment and Plan. ? ?Right Buccal Cheek ?Dilated blood vessel ? ?Left chest x 1, left forearm x 1 (2) ?Erythematous stuck-on, waxy papule or plaque ? ?Legs ?Spider veins ? ? ?Assessment & Plan  ? ?History of Squamous Cell Carcinoma of the Skin ?- No evidence of recurrence today ?- No lymphadenopathy ?- Recommend regular full body skin exams ?- Recommend daily broad spectrum sunscreen SPF 30+ to sun-exposed areas, reapply every 2 hours as needed.  ?- Call if any new or changing lesions are noted between office visits ? ?Actinic Damage ?- chronic, secondary to cumulative UV radiation exposure/sun exposure over time ?- diffuse scaly erythematous macules with underlying dyspigmentation ?- Recommend daily broad spectrum sunscreen SPF 30+ to sun-exposed areas, reapply every 2 hours as needed.  ?- Recommend staying in the shade or wearing long sleeves, sun glasses (UVA+UVB protection) and wide brim hats (4-inch brim around the entire circumference of the hat). ?- Call for new or changing lesions. ? ?Lentigines ?- Scattered tan macules ?- Due to sun exposure ?- Benign-appering, observe ?- Recommend daily broad spectrum sunscreen SPF  30+ to sun-exposed areas, reapply every 2 hours as needed. ?- Call for any changes ? ?Seborrheic Keratoses ?- Stuck-on, waxy, tan-brown papules and/or plaques  ?- Benign-appearing ?- Discussed benign etiology and prognosis. ?- Observe ?- Call for any changes ? ?Telangiectasia ?Right Buccal Cheek ? ?Vs [place] - Discussed the treatment option of BBL/laser.  Typically we recommend 1-3 treatment sessions about 5-8 weeks apart for best results.  The patient's condition may require "maintenance treatments" in the future.  The fee for BBL / laser treatments is $350 per treatment session for the whole face.  A fee can be quoted for other parts of the body. ?Insurance typically does not pay for BBL/laser treatments and therefore the fee is an out-of-pocket cost. ? ?Inflamed seborrheic keratosis (2) ?Left chest x 1, left forearm x 1 ?Irritated ?Destruction of lesion - Left chest x 1, left forearm x 1 ?Complexity: simple   ?Destruction method: cryotherapy   ?Informed consent: discussed and consent obtained   ?Timeout:  patient name, date of birth, surgical site, and procedure verified ?Lesion destroyed using liquid nitrogen: Yes   ?Region frozen until ice ball extended beyond lesion: Yes   ?Outcome: patient tolerated procedure well with no complications   ?Post-procedure details: wound care instructions given   ? ?Spider veins of both lower extremities ?Legs ?Benign ? ?Return in about 1 year (around 07/08/2022). ? ?I, Ashok Cordia, CMA, am acting as scribe for Sarina Ser, MD . ?Documentation: I have reviewed the above documentation for accuracy and completeness, and I  agree with the above. ? ?Sarina Ser, MD ? ?

## 2021-07-15 ENCOUNTER — Encounter: Payer: Self-pay | Admitting: Dermatology

## 2021-10-09 ENCOUNTER — Ambulatory Visit
Admission: RE | Admit: 2021-10-09 | Discharge: 2021-10-09 | Disposition: A | Discharge status: Home / Self Care | Payer: Medicare HMO | Source: Ambulatory Visit | Attending: Oncology | Admitting: Oncology

## 2021-10-09 DIAGNOSIS — Z1231 Encounter for screening mammogram for malignant neoplasm of breast: Secondary | ICD-10-CM | POA: Insufficient documentation

## 2021-10-09 DIAGNOSIS — C50512 Malignant neoplasm of lower-outer quadrant of left female breast: Secondary | ICD-10-CM | POA: Insufficient documentation

## 2021-11-13 ENCOUNTER — Other Ambulatory Visit (INDEPENDENT_AMBULATORY_CARE_PROVIDER_SITE_OTHER): Payer: Self-pay | Admitting: Nurse Practitioner

## 2021-11-13 DIAGNOSIS — I70212 Atherosclerosis of native arteries of extremities with intermittent claudication, left leg: Secondary | ICD-10-CM

## 2021-11-14 ENCOUNTER — Encounter (INDEPENDENT_AMBULATORY_CARE_PROVIDER_SITE_OTHER): Payer: Self-pay | Admitting: Vascular Surgery

## 2021-11-14 ENCOUNTER — Ambulatory Visit (INDEPENDENT_AMBULATORY_CARE_PROVIDER_SITE_OTHER): Payer: Medicare HMO | Admitting: Vascular Surgery

## 2021-11-14 ENCOUNTER — Ambulatory Visit (INDEPENDENT_AMBULATORY_CARE_PROVIDER_SITE_OTHER): Payer: Medicare HMO

## 2021-11-14 VITALS — BP 142/85 | HR 96 | Resp 16 | Wt 203.6 lb

## 2021-11-14 DIAGNOSIS — I739 Peripheral vascular disease, unspecified: Secondary | ICD-10-CM

## 2021-11-14 DIAGNOSIS — I10 Essential (primary) hypertension: Secondary | ICD-10-CM

## 2021-11-14 DIAGNOSIS — E785 Hyperlipidemia, unspecified: Secondary | ICD-10-CM | POA: Diagnosis not present

## 2021-11-14 DIAGNOSIS — E118 Type 2 diabetes mellitus with unspecified complications: Secondary | ICD-10-CM | POA: Diagnosis not present

## 2021-11-14 DIAGNOSIS — D6851 Activated protein C resistance: Secondary | ICD-10-CM

## 2021-11-14 DIAGNOSIS — I70212 Atherosclerosis of native arteries of extremities with intermittent claudication, left leg: Secondary | ICD-10-CM

## 2021-11-14 NOTE — Assessment & Plan Note (Signed)
blood glucose control important in reducing the progression of atherosclerotic disease. Also, involved in wound healing. On appropriate medications.  

## 2021-11-14 NOTE — Assessment & Plan Note (Signed)
blood pressure control important in reducing the progression of atherosclerotic disease. On appropriate oral medications.  

## 2021-11-14 NOTE — Assessment & Plan Note (Signed)
ABIs today remain normal at 1.27 on the right and 1.21 on the left with triphasic waveforms and normal digital pressures bilaterally.  Does have some inflow disease on a previous CT scan and has some mild left lower extremity claudication which may be as much neuropathic as it is arterial particularly given these findings.  No role for intervention.  Recheck in 1 year.

## 2021-11-14 NOTE — Progress Notes (Signed)
MRN : [id number]  Stacy Reese is a 76 y.o. ([birth year]) female who presents with chief complaint of  Chief Complaint  Patient presents with   Follow-up    Ultrasound follow up  .  History of Present Illness: Patient returns today in follow up of her known aortoiliac disease seen on a previous CT several years ago.  She has some mild left lower extremity claudication but is difficult to discern how much of this is neurogenic and how much of this is from arterial insufficiency.  No rest pain.  No ulceration.  No major changes from her visit 1 year ago. ABIs today remain normal at 1.27 on the right and 1.21 on the left with triphasic waveforms and normal digital pressures bilaterally.  Current Outpatient Medications  Medication Sig Dispense Refill   busPIRone (BUSPAR) 10 MG tablet Take 10 mg by mouth daily.      citalopram (CELEXA) 20 MG tablet Take 20 mg by mouth daily.      diltiazem (TIAZAC) 240 MG 24 hr capsule Take 240 mg by mouth daily.      pantoprazole (PROTONIX) 40 MG tablet Take 1 tablet (40 mg total) by mouth daily. 30 tablet 11   pravastatin (PRAVACHOL) 40 MG tablet Take 40 mg by mouth daily.      triamterene-hydrochlorothiazide (MAXZIDE-25) 37.5-25 MG per tablet Take 1 tablet by mouth daily.      warfarin (COUMADIN) 2 MG tablet Take 2 mg by mouth See admin instructions. Take with 5 mg for a total of 7 mg     warfarin (COUMADIN) 5 MG tablet Take 5 mg by mouth See admin instructions. Takes along with a '2mg'$  to equal '7mg'$  daily     alendronate (FOSAMAX) 70 MG tablet TAKE ONE TABLET BY MOUTH ONCE WEEKLY. TAKE WITH A FULL GLASS OF WATER ON AN EMPTY STOMACH (Patient not taking: Reported on 11/14/2021) 12 tablet 3   aspirin 81 MG EC tablet Take by mouth. (Patient not taking: Reported on 05/20/2021)     LORazepam (ATIVAN) 1 MG tablet Take 1 mg by mouth daily as needed for anxiety.  (Patient not taking: Reported on 05/20/2021)     Multiple Vitamins-Minerals (MULTIVITAMIN WITH MINERALS)  tablet Take 1 tablet by mouth daily. Alive (Patient not taking: Reported on 05/20/2021) 90 tablet 0   No current facility-administered medications for this visit.    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 01/30/2010   R inf pretibial - ED&C    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 04/14/2016   L mid med pretibial    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 05/20/2016   L mid to distal med  pretibial    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 07/20/2016   L mid med pretibial    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 05/10/2017   L mid pretibial    Personal history of chemotherapy    1 round   Personal history of radiation therapy    Skin cancer    squamous cell   Squamous cell carcinoma of skin 03/24/2006   L sup pretibial    Squamous cell carcinoma of skin 03/24/2006   R pretibial inf    Squamous cell carcinoma of skin 05/25/2006   L lat mid lower leg    Squamous cell carcinoma of skin 06/29/2006   R pretibial lat    Squamous cell carcinoma of skin 10/17/2012   R prox pretibial ED&C   Squamous cell carcinoma of skin 12/19/2012   R prox sup pretibial    Squamous cell carcinoma of  skin 02/03/2016   L med mid pretibial    Squamous cell carcinoma of skin 08/20/2016   L pretibial sup    Squamous cell carcinoma of skin 08/20/2016   L pretibial inf    Squamous cell carcinoma of skin 10/10/2018   L prox med pretibial    Squamous cell carcinoma of skin 06/29/2019   L calf     Past Surgical History:  Procedure Laterality Date   BREAST BIOPSY Left 09/04/2013   negative stereo bx   BREAST BIOPSY Left 08/25/2013   postive [place] core bx   BREAST LUMPECTOMY Left 08/2017   BREAST LUMPECTOMY W/ NEEDLE LOCALIZATION Left 2015   ESOPHAGOGASTRODUODENOSCOPY (EGD) WITH PROPOFOL N/A 08/05/2018   Procedure: ESOPHAGOGASTRODUODENOSCOPY (EGD) WITH PROPOFOL;  Surgeon: Lucilla Lame, MD;  Location: [facility] ENDOSCOPY;  Service: Endoscopy;  Laterality: N/A;   GANGLION CYST EXCISION     OPEN REDUCTION INTERNAL FIXATION (ORIF) DISTAL RADIAL FRACTURE Left 07/11/2019   Procedure: OPEN REDUCTION INTERNAL FIXATION (ORIF) DISTAL RADIAL FRACTURE;  Surgeon: Hessie Knows, MD;  Location: [facility] ORS;  Service: Orthopedics;  Laterality: Left;     Social History   Tobacco Use   Smoking status: Former   Smokeless tobacco: Never  Substance Use Topics   Alcohol use: Yes    Comment: social   Drug use: No      Family History  Problem Relation Age of Onset   Cancer Father        colon   CAD Father    Hypertension Father    Hyperlipidemia Father    CAD Mother    Breast cancer Neg Hx      Allergies  Allergen Reactions   Metoprolol Shortness Of Breath   Other Other (See Comments)    Sodium pentothal  Causes blood clots    REVIEW OF SYSTEMS (Negative unless checked)   Constitutional: '[]'$ Weight loss  '[]'$ Fever  '[]'$ Chills Cardiac: '[]'$ Chest pain   '[]'$ Chest pressure   '[x]'$ Palpitations   '[]'$ Shortness of breath when laying flat   '[]'$ Shortness of breath at rest   '[x]'$ Shortness of breath with exertion. Vascular:  '[x]'$ Pain in legs with walking   '[]'$ Pain in legs at rest   '[]'$ Pain in legs when laying flat    '[x]'$ Claudication   '[]'$ Pain in feet when walking  '[]'$ Pain in feet at rest  '[]'$ Pain in feet when laying flat   '[x]'$ History of DVT   '[]'$ Phlebitis   '[]'$ Swelling in legs   '[]'$ Varicose veins   '[]'$ Non-healing ulcers Pulmonary:   '[]'$ Uses home oxygen   '[]'$ Productive cough   '[]'$ Hemoptysis   '[]'$ Wheeze  '[]'$ COPD   '[]'$   Asthma Neurologic:  '[]'$ Dizziness  '[]'$ Blackouts   '[]'$ Seizures   '[]'$ History of stroke   '[]'$ History of TIA  '[]'$ Aphasia   '[]'$ Temporary blindness   '[]'$ Dysphagia   '[]'$ Weakness or numbness in arms   '[]'$ Weakness or numbness in legs Musculoskeletal:  '[x]'$ Arthritis   '[]'$ Joint swelling   '[]'$ Joint pain   '[]'$ Low back pain Hematologic:  '[]'$ Easy bruising  '[]'$ Easy bleeding   '[]'$ Hypercoagulable state   '[x]'$ Anemic  '[]'$ Hepatitis Gastrointestinal:  '[x]'$ Blood in stool   '[]'$ Vomiting blood  '[]'$ Gastroesophageal reflux/heartburn   '[x]'$ Abdominal pain Genitourinary:  '[]'$ Chronic kidney disease   '[]'$ Difficult urination  '[]'$ Frequent urination  '[]'$ Burning with urination   '[]'$ Hematuria Skin:  '[]'$ Rashes   '[]'$ Ulcers   '[]'$ Wounds Psychological:  '[]'$ History of anxiety   '[]'$  History of major depression.  Physical Examination  BP (!) 142/85 (BP Location: Right Arm)   Pulse 96   Resp 16   Wt 203 lb 9.6 oz (92.4 kg)   BMI 32.86 kg/m  Gen:  WD/WN, NAD Head: /AT, No temporalis wasting. Ear/Nose/Throat: Hearing grossly intact, nares w/o erythema or drainage Eyes: Conjunctiva clear. Sclera non-icteric Neck: Supple.  Trachea midline Pulmonary:  Good air movement, no use of accessory muscles.  Cardiac: RRR, no JVD Vascular:  Vessel Right Left  Radial Palpable Palpable                          PT Palpable Palpable  DP Palpable Palpable   Gastrointestinal: soft, non-tender/non-distended. No guarding/reflex.  Musculoskeletal: M/S 5/5 throughout.  No deformity or atrophy. Trace LE edema. Neurologic: Sensation grossly intact in extremities.  Symmetrical.  Speech is fluent.  Psychiatric: Judgment intact, Mood & affect appropriate for pt's clinical situation. Dermatologic:  No rashes or ulcers noted.  No cellulitis or open wounds.      Labs No results found for this or any previous visit (from the past 2160 hour(s)).  Radiology No results found.  Assessment/Plan Heterozygous factor V Leiden mutation (HCC) On anticoagulation, history of DVT   Hyperlipidemia lipid control important in reducing the progression of atherosclerotic disease. Continue statin therapy  HTN (hypertension), benign blood pressure control important in reducing the progression of atherosclerotic disease. On appropriate oral medications.   Diabetes mellitus type 2, controlled, with complications ([place]) blood glucose control important in reducing the progression of atherosclerotic disease. Also, involved in wound healing. On appropriate medications.   PVD (peripheral vascular disease) ([place]) ABIs today remain normal at 1.27 on the right and 1.21 on the left with triphasic waveforms and normal digital pressures bilaterally.  Does have some inflow disease on a previous CT scan and has some mild left lower extremity claudication which may be as much neuropathic as it is arterial particularly given these findings.  No role for intervention.  Recheck in 1 year.    Leotis Pain, MD  11/14/2021 11:22 AM    This note was created with Dragon medical transcription system.  Any errors from dictation are purely unintentional

## 2022-01-19 ENCOUNTER — Encounter (INDEPENDENT_AMBULATORY_CARE_PROVIDER_SITE_OTHER): Payer: Self-pay

## 2022-03-27 ENCOUNTER — Other Ambulatory Visit: Payer: Self-pay | Admitting: Physician Assistant

## 2022-03-27 DIAGNOSIS — R0609 Other forms of dyspnea: Secondary | ICD-10-CM

## 2022-03-27 DIAGNOSIS — Z87891 Personal history of nicotine dependence: Secondary | ICD-10-CM

## 2022-04-14 ENCOUNTER — Ambulatory Visit: Payer: Medicare HMO | Attending: Physician Assistant

## 2022-04-14 DIAGNOSIS — Z87891 Personal history of nicotine dependence: Secondary | ICD-10-CM

## 2022-04-14 DIAGNOSIS — R0609 Other forms of dyspnea: Secondary | ICD-10-CM

## 2022-04-14 LAB — PULMONARY FUNCTION TEST ARMC ONLY
DL/VA % pred: 72 %
DL/VA: 2.94 ml/min/mmHg/L
DLCO unc % pred: 53 %
DLCO unc: 10.6 ml/min/mmHg
FEF 25-75 Post: 1.06 L/sec
FEF 25-75 Pre: 1.09 L/sec
FEF2575-%Change-Post: -2 %
FEF2575-%Pred-Post: 59 %
FEF2575-%Pred-Pre: 61 %
FEV1-%Change-Post: 6 %
FEV1-%Pred-Post: 75 %
FEV1-%Pred-Pre: 71 %
FEV1-Post: 1.69 L
FEV1-Pre: 1.59 L
FEV1FVC-%Change-Post: 12 %
FEV1FVC-%Pred-Pre: 88 %
FEV6-%Change-Post: -4 %
FEV6-%Pred-Post: 78 %
FEV6-%Pred-Pre: 82 %
FEV6-Post: 2.24 L
FEV6-Pre: 2.34 L
FEV6FVC-%Pred-Post: 104 %
FEV6FVC-%Pred-Pre: 104 %
FVC-%Change-Post: -6 %
FVC-%Pred-Post: 75 %
FVC-%Pred-Pre: 80 %
FVC-Post: 2.24 L
FVC-Pre: 2.38 L
Post FEV1/FVC ratio: 75 %
Post FEV6/FVC ratio: 100 %
Pre FEV1/FVC ratio: 67 %
Pre FEV6/FVC Ratio: 100 %
RV % pred: 74 %
RV: 1.73 L
TLC % pred: 78 %
TLC: 4.07 L

## 2022-04-14 MED ORDER — ALBUTEROL SULFATE (2.5 MG/3ML) 0.083% IN NEBU
2.5000 mg | INHALATION_SOLUTION | Freq: Once | RESPIRATORY_TRACT | Status: AC
  Administered 2022-04-14: 2.5 mg via RESPIRATORY_TRACT
  Filled 2022-04-14: qty 3

## 2022-05-21 ENCOUNTER — Encounter: Payer: Self-pay | Admitting: Nurse Practitioner

## 2022-05-21 ENCOUNTER — Inpatient Hospital Stay: Payer: Medicare HMO | Attending: Nurse Practitioner | Admitting: Nurse Practitioner

## 2022-05-21 VITALS — BP 173/91 | HR 91 | Temp 97.6°F | Wt 200.0 lb

## 2022-05-21 DIAGNOSIS — Z08 Encounter for follow-up examination after completed treatment for malignant neoplasm: Secondary | ICD-10-CM

## 2022-05-21 DIAGNOSIS — Z85828 Personal history of other malignant neoplasm of skin: Secondary | ICD-10-CM | POA: Diagnosis not present

## 2022-05-21 DIAGNOSIS — Z86711 Personal history of pulmonary embolism: Secondary | ICD-10-CM | POA: Insufficient documentation

## 2022-05-21 DIAGNOSIS — Z7901 Long term (current) use of anticoagulants: Secondary | ICD-10-CM | POA: Insufficient documentation

## 2022-05-21 DIAGNOSIS — Z86718 Personal history of other venous thrombosis and embolism: Secondary | ICD-10-CM | POA: Insufficient documentation

## 2022-05-21 DIAGNOSIS — D6851 Activated protein C resistance: Secondary | ICD-10-CM | POA: Insufficient documentation

## 2022-05-21 DIAGNOSIS — Z853 Personal history of malignant neoplasm of breast: Secondary | ICD-10-CM | POA: Diagnosis not present

## 2022-05-21 NOTE — Progress Notes (Signed)
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  Heterozygote factor V 5 Leiden.   INTERVAL HISTORY: Stacy Reese is a 77 y.o. female with above history of breast cancer and factor V leiden carrier, on coumadin, who returns to clinic for ongoing breast surveillance and follow up. She is tolerating coumadin well. Prefers coumadin d/t cost. No recent bleeding episodes. INR managed by coumadin clinic. Denies breast pain, skin changes, or masses. She completed 7 years of letrozole in April 2022. She continues to tolerate Coumadin well and her INR is monitored by her primary care.  She denies any pain.  She has no neurologic complaints. She denies any recent fevers or illnesses. She denies any chest pain, cough, hemoptysis, or shortness of breath. She denies any nausea, vomiting, constipation or diarrhea. She has no melena or hematochezia. She has no urinary complaints.  Patient offers no specific complaints today.  REVIEW OF SYSTEMS:   Review of Systems  Constitutional: Negative.  Negative for fever, malaise/fatigue and weight loss.  Respiratory: Negative.  Negative for cough and shortness of breath.   Cardiovascular: Negative.  Negative for chest pain and leg swelling.  Gastrointestinal: Negative.  Negative for abdominal pain, blood in stool and melena.  Genitourinary: Negative.  Negative for hematuria.  Musculoskeletal: Negative.  Negative for back pain.  Skin: Negative.  Negative for rash.  Neurological: Negative.  Negative for focal weakness, weakness and headaches.  Endo/Heme/Allergies:  Does not bruise/bleed easily.  Psychiatric/Behavioral: Negative.  The patient is not nervous/anxious.    As per HPI. Otherwise, a complete review of systems is negative.  PAST MEDICAL HISTORY: 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 01/30/2010   R inf pretibial - ED&C    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 04/14/2016   L mid med pretibial    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 05/20/2016   L mid to distal med pretibial    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 07/20/2016   L mid med pretibial    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 05/10/2017   L mid pretibial    Personal history of chemotherapy    1 round   Personal history of radiation therapy    Skin cancer    squamous cell   Squamous cell carcinoma of skin 03/24/2006   L sup pretibial    Squamous cell carcinoma of skin 03/24/2006  R pretibial inf    Squamous cell carcinoma of skin 05/25/2006   L lat mid lower leg    Squamous cell carcinoma of skin 06/29/2006   R pretibial lat    Squamous cell carcinoma of skin 10/17/2012   R prox pretibial ED&C   Squamous cell carcinoma of skin 12/19/2012   R prox sup pretibial    Squamous cell carcinoma of skin 02/03/2016   L med mid pretibial    Squamous cell carcinoma of skin  08/20/2016   L pretibial sup    Squamous cell carcinoma of skin 08/20/2016   L pretibial inf    Squamous cell carcinoma of skin 10/10/2018   L prox med pretibial    Squamous cell carcinoma of skin 06/29/2019   L calf     PAST SURGICAL HISTORY: Past Surgical History:  Procedure Laterality Date   BREAST BIOPSY Left 09/04/2013   negative stereo bx   BREAST BIOPSY Left 08/25/2013   postive [place] core bx   BREAST LUMPECTOMY Left 08/2017   BREAST LUMPECTOMY W/ NEEDLE LOCALIZATION Left 2015   ESOPHAGOGASTRODUODENOSCOPY (EGD) WITH PROPOFOL N/A 08/05/2018   Procedure: ESOPHAGOGASTRODUODENOSCOPY (EGD) WITH PROPOFOL;  Surgeon: Midge Minium, MD;  Location: [facility] ENDOSCOPY;  Service: Endoscopy;  Laterality: N/A;   GANGLION CYST EXCISION     OPEN REDUCTION INTERNAL FIXATION (ORIF) DISTAL RADIAL FRACTURE Left 07/11/2019   Procedure: OPEN REDUCTION INTERNAL FIXATION (ORIF) DISTAL RADIAL FRACTURE;  Surgeon: Kennedy Bucker, MD;  Location: [facility] ORS;  Service: Orthopedics;  Laterality: Left;    FAMILY HISTORY Family History  Problem Relation Age of Onset   Cancer Father        colon   CAD Father    Hypertension Father    Hyperlipidemia Father    CAD Mother    Breast cancer Neg Hx        ADVANCED DIRECTIVES:    HEALTH MAINTENANCE: Social History   Tobacco Use   Smoking status: Former   Smokeless tobacco: Never  Building services engineer Use: Never used  Substance Use Topics   Alcohol use: Yes    Comment: social   Drug use: No     Colonoscopy:  PAP:  Bone density:  Lipid panel:  Allergies  Allergen Reactions   Metoprolol Shortness Of Breath   Other Other (See Comments)    Sodium pentothal  Causes blood clots    Current Outpatient Medications  Medication Sig Dispense Refill   busPIRone (BUSPAR) 10 MG tablet Take 10 mg by mouth daily.      citalopram (CELEXA) 20 MG tablet Take 20 mg by mouth daily.      cyanocobalamin (VITAMIN B12) 1000 MCG tablet Take 1,000 mcg by mouth daily.      diltiazem (TIAZAC) 240 MG 24 hr capsule Take 240 mg by mouth daily.      pantoprazole (PROTONIX) 40 MG tablet Take 1 tablet (40 mg total) by mouth daily. 30 tablet 11   pravastatin (PRAVACHOL) 40 MG tablet Take 40 mg by mouth daily.      warfarin (COUMADIN) 1 MG tablet Take 1 mg by mouth daily. This week 1 pill daily for 7 days     warfarin (COUMADIN) 5 MG tablet Take 5 mg by mouth See admin instructions. Takes along with a 2mg  to equal 7mg  daily     LORazepam (ATIVAN) 1 MG tablet Take 1 mg by mouth daily as needed for anxiety.  (Patient not taking: Reported on 05/20/2021)     No  current facility-administered medications for this visit.    OBJECTIVE: Vitals:   05/21/22 1122  BP: (!) 173/91  Pulse: 91  Temp: 97.6 F (36.4 C)     Body mass index is 32.28 kg/m.    ECOG FS:0 - Asymptomatic  General: Well-developed, well-nourished, no acute distress. Eyes: Pink conjunctiva, anicteric sclera. Lungs: Clear to auscultation bilaterally.  No audible wheezing or coughing Heart: Regular rate and rhythm.  Abdomen: Soft, nontender, nondistended.  Musculoskeletal: No edema, cyanosis, or clubbing. Neuro: Alert, answering all questions appropriately. Cranial nerves grossly intact. Skin: No rashes or petechiae noted. Psych: Normal affect.   LAB RESULTS:  Lab Results  Component Value Date   NA 135 05/04/2021   K 4.3 05/04/2021   CL 99 05/04/2021   CO2 27 05/04/2021   GLUCOSE 151 (H) 05/04/2021   BUN 10 05/04/2021   CREATININE 0.81 05/04/2021   CALCIUM 8.6 (L) 05/04/2021   PROT 8.1 05/04/2021   ALBUMIN 3.8 05/04/2021   AST 21 05/04/2021   ALT 17 05/04/2021   ALKPHOS 63 05/04/2021   BILITOT 1.9 (H) 05/04/2021   GFRNONAA >60 05/04/2021   GFRAA >60 07/11/2019    Lab Results  Component Value Date   WBC 7.7 05/04/2021   NEUTROABS 7.2 05/03/2021   HGB 14.5 05/04/2021   HCT 44.5 05/04/2021   MCV 85.7 05/04/2021   PLT 193 05/04/2021     STUDIES: No results found.  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  Heterozygote factor V 5 Leiden.  PLAN:    1. 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 She received one cycle of Taxotere and Cytoxan, but secondary to significant toxicity this was discontinued.  Because of her increased Oncotype score, patient agreed to take a total of 7 years of letrozole which she completed in April 2022.  Her most recent bilateral screening mammogram on 10/09/21 was reported as bi-rads 1: negative. Recommend continuing annual mammogram. Clinically asymptomatic today. Continue surveillance. After 10 years of surveillance we can consider releasing to her pcp for surveillance.  2. Heterozygote factor V 5 Leiden: Patient was diagnosed with PE and DVT at an outside hospital. She completed 6 months of treatment with Xarelto, but given her increased risk she elected to stay on anticoagulation lifelong.  She is now on Coumadin and INR is managed by her primary care physician.  Goal INR is 2.0-3.0. Tolerating well.  3. Osteopenia: Patient's most recent bone mineral density on October 08, 2020 reported T score of -2.2 which is slightly improved over 1 year prior.  Continue Fosamax, calcium, and vitamin D.  This is now managed by primary care.   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  T10 soft tissue mass is essentially unchanged since at least May 2020.  Most likely benign process.  No further imaging is necessary.    Disposition:  July 2024- mammogram 1 year - see Dr Orlie Dakin or myself for breast cancer surveillance.   Patient expressed understanding and was in agreement with this plan. She also understands that She can call clinic at any time with any questions, concerns, or  complaints.   Breast cancer   Staging form: Breast, AJCC 7th Edition     Clinical stage from  09/22/2014: Stage IA (T1b, N0, M0) - Signed by Jeralyn Ruths, MD on 09/22/2014   Alinda Dooms, NP   05/21/2022

## 2022-06-29 ENCOUNTER — Encounter: Payer: Self-pay | Admitting: Dermatology

## 2022-06-29 ENCOUNTER — Ambulatory Visit: Payer: Medicare HMO | Admitting: Dermatology

## 2022-06-29 VITALS — BP 163/81 | HR 78

## 2022-06-29 DIAGNOSIS — L820 Inflamed seborrheic keratosis: Secondary | ICD-10-CM

## 2022-06-29 DIAGNOSIS — L570 Actinic keratosis: Secondary | ICD-10-CM | POA: Diagnosis not present

## 2022-06-29 DIAGNOSIS — L814 Other melanin hyperpigmentation: Secondary | ICD-10-CM | POA: Diagnosis not present

## 2022-06-29 DIAGNOSIS — L821 Other seborrheic keratosis: Secondary | ICD-10-CM | POA: Diagnosis not present

## 2022-06-29 DIAGNOSIS — L578 Other skin changes due to chronic exposure to nonionizing radiation: Secondary | ICD-10-CM | POA: Diagnosis not present

## 2022-06-29 DIAGNOSIS — Z85828 Personal history of other malignant neoplasm of skin: Secondary | ICD-10-CM

## 2022-06-29 DIAGNOSIS — Z8589 Personal history of malignant neoplasm of other organs and systems: Secondary | ICD-10-CM

## 2022-06-29 NOTE — Progress Notes (Signed)
Follow-Up Visit   Subjective  Stacy Reese is a 77 y.o. female who presents for the following: Spots. Check face and legs. 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    The following portions of the chart were reviewed this encounter and updated as appropriate: medications, allergies, medical history  Review of Systems:  No other skin or systemic complaints except as noted in HPI or Assessment and Plan.  Objective  Well appearing patient in no apparent distress; mood and affect are within normal limits.  A focused examination was performed of the following areas: Face, legs.  Relevant physical exam findings are noted in the Assessment and Plan.    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  - Call if any new or changing lesions are noted between office visits  INFLAMED SEBORRHEIC KERATOSIS Exam: Erythematous keratotic or waxy stuck-on papule or plaque.  Symptomatic, irritating, patient would like treated.  Benign-appearing.  Call clinic for new or changing lesions.   Prior to procedure, discussed risks of blister formation, small wound, skin dyspigmentation, or rare scar following treatment. Recommend Vaseline ointment to treated areas while healing.  Destruction Procedure Note Destruction method: cryotherapy   Informed consent: discussed and consent obtained   Lesion destroyed using liquid nitrogen: Yes   Outcome: patient tolerated procedure well with no complications   Post-procedure details: wound care instructions given   Locations: Right cheek x1 # of Lesions Treated:  1  LENTIGINES Exam: scattered tan macules Due to sun exposure Treatment Plan: Benign-appearing, observe. Recommend daily broad spectrum sunscreen SPF 30+ to sun-exposed areas, reapply every 2 hours as needed.  Call for any changes  SEBORRHEIC KERATOSIS - Stuck-on, waxy, tan-brown papules and/or plaques  - Benign-appearing - Discussed benign etiology and prognosis. - Observe - Call for any changes  ACTINIC DAMAGE - chronic, secondary to cumulative UV radiation exposure/sun exposure over time - diffuse scaly erythematous macules with underlying dyspigmentation - Recommend daily broad spectrum sunscreen SPF 30+ to sun-exposed areas, reapply every 2 hours as needed.  - Recommend staying in the shade or wearing long sleeves, sun glasses (UVA+UVB protection) and wide brim hats (4-inch brim around the entire circumference of the hat). - Call for new or changing lesions.   ACTINIC KERATOSIS Exam: Erythematous thin papules/macules with gritty scale  Actinic keratoses are precancerous spots that appear secondary to cumulative UV radiation exposure/sun exposure over time. They are chronic with expected duration over 1 year. A portion of actinic keratoses will progress to squamous cell carcinoma of the skin. It is not possible to reliably predict which spots will progress to skin cancer and so treatment is recommended to prevent development of skin cancer.  Recommend daily broad spectrum sunscreen SPF 30+ to sun-exposed areas, reapply every 2 hours as needed.  Recommend staying in the shade or wearing long sleeves, sun glasses (UVA+UVB protection) and wide brim hats (4-inch brim around the entire circumference of the hat). Call for new or changing lesions.  Treatment Plan:  Prior to procedure, discussed risks of blister formation, small wound, skin dyspigmentation, or rare scar following cryotherapy. Recommend  Vaseline ointment to treated areas while healing.  Destruction Procedure  Note Destruction method: cryotherapy   Informed consent: discussed and consent obtained   Lesion destroyed using liquid nitrogen: Yes   Outcome: patient tolerated procedure well with no complications   Post-procedure details: wound care instructions given   Locations: right lower leg x7, left lower leg x1 # of Lesions Treated: 8   Return in about 6 months (around 12/29/2022) for AK Follow Up.  I, Lawson Radar, CMA, am acting as scribe for Armida Sans, MD.   Documentation: I have reviewed the above documentation for accuracy and completeness, and I agree with the above.  Armida Sans, MD

## 2022-06-29 NOTE — Patient Instructions (Addendum)
Cryotherapy Aftercare  Wash gently with soap and water everyday.   Apply Vaseline daily until healed.    Recommend daily broad spectrum sunscreen SPF 30+ to sun-exposed areas, reapply every 2 hours as needed. Call for new or changing lesions.  Staying in the shade or wearing long sleeves, sun glasses (UVA+UVB protection) and wide brim hats (4-inch brim around the entire circumference of the hat) are also recommended for sun protection.    Seborrheic Keratosis  What causes seborrheic keratoses? Seborrheic keratoses are harmless, common skin growths that first appear during adult life.  As time goes by, more growths appear.  Some people may develop a large number of them.  Seborrheic keratoses appear on both covered and uncovered body parts.  They are not caused by sunlight.  The tendency to develop seborrheic keratoses can be inherited.  They vary in color from skin-colored to gray, brown, or even black.  They can be either smooth or have a rough, warty surface.   Seborrheic keratoses are superficial and look as if they were stuck on the skin.  Under the microscope this type of keratosis looks like layers upon layers of skin.  That is why at times the top layer may seem to fall off, but the rest of the growth remains and re-grows.    Treatment Seborrheic keratoses do not need to be treated, but can easily be removed in the office.  Seborrheic keratoses often cause symptoms when they rub on clothing or jewelry.  Lesions can be in the way of shaving.  If they become inflamed, they can cause itching, soreness, or burning.  Removal of a seborrheic keratosis can be accomplished by freezing, burning, or surgery. If any spot bleeds, scabs, or grows rapidly, please return to have it checked, as these can be an indication of a skin cancer.    Due to recent changes in healthcare laws, you may see results of your pathology and/or laboratory studies on MyChart before the doctors have had a chance to review  them. We understand that in some cases there may be results that are confusing or concerning to you. Please understand that not all results are received at the same time and often the doctors may need to interpret multiple results in order to provide you with the best plan of care or course of treatment. Therefore, we ask that you please give us 2 business days to thoroughly review all your results before contacting the office for clarification. Should we see a critical lab result, you will be contacted sooner.   If You Need Anything After Your Visit  If you have any questions or concerns for your doctor, please call our main line at [phone] and press option 4 to reach your doctor's medical assistant. If no one answers, please leave a voicemail as directed and we will return your call as soon as possible. Messages left after 4 pm will be answered the following business day.   You may also send us a message via MyChart. We typically respond to MyChart messages within 1-2 business days.  For prescription refills, please ask your pharmacy to contact our office. Our fax number is [phone].  If you have an urgent issue when the clinic is closed that cannot wait until the next business day, you can page your doctor at the number below.    Please note that while we do our best to be available for urgent issues outside of office hours, we are not available 24/7.     If you have an urgent issue and are unable to reach us, you may choose to seek medical care at your doctor's office, retail clinic, urgent care center, or emergency room.  If you have a medical emergency, please immediately call 911 or go to the emergency department.  Pager Numbers  - Dr. Kowalski: [phone]  - Dr. Moye: [phone]  - Dr. Stewart: [phone]  In the event of inclement weather, please call our main line at [phone] for an update on the status of any delays or closures.  Dermatology Medication  Tips: Please keep the boxes that topical medications come in in order to help keep track of the instructions about where and how to use these. Pharmacies typically print the medication instructions only on the boxes and not directly on the medication tubes.   If your medication is too expensive, please contact our office at [phone] option 4 or send us a message through MyChart.   We are unable to tell what your co-pay for medications will be in advance as this is different depending on your insurance coverage. However, we may be able to find a substitute medication at lower cost or fill out paperwork to get insurance to cover a needed medication.   If a prior authorization is required to get your medication covered by your insurance company, please allow us 1-2 business days to complete this process.  Drug prices often vary depending on where the prescription is filled and some pharmacies may offer cheaper prices.  The website www.goodrx.com contains coupons for medications through different pharmacies. The prices here do not account for what the cost may be with help from insurance (it may be cheaper with your insurance), but the website can give you the price if you did not use any insurance.  - You can print the associated coupon and take it with your prescription to the pharmacy.  - You may also stop by our office during regular business hours and pick up a GoodRx coupon card.  - If you need your prescription sent electronically to a different pharmacy, notify our office through [facility] MyChart or by phone at [phone] option 4.     Si Usted Necesita Algo Despus de Su Visita  Tambin puede enviarnos un mensaje a travs de MyChart. Por lo general respondemos a los mensajes de MyChart en el transcurso de 1 a 2 das hbiles.  Para renovar recetas, por favor pida a su farmacia que se ponga en contacto con nuestra oficina. Nuestro nmero de fax es el [phone].  Si tiene un  asunto urgente cuando la clnica est cerrada y que no puede esperar hasta el siguiente da hbil, puede llamar/localizar a su doctor(a) al nmero que aparece a continuacin.   Por favor, tenga en cuenta que aunque hacemos todo lo posible para estar disponibles para asuntos urgentes fuera del horario de oficina, no estamos disponibles las 24 horas del da, los 7 das de la semana.   Si tiene un problema urgente y no puede comunicarse con nosotros, puede optar por buscar atencin mdica  en el consultorio de su doctor(a), en una clnica privada, en un centro de atencin urgente o en una sala de emergencias.  Si tiene una emergencia mdica, por favor llame inmediatamente al 911 o vaya a la sala de emergencias.  Nmeros de bper  - Dr. Kowalski: [phone]  - Dra. Moye: [phone]  - Dra. Stewart: [phone]  En caso de inclemencias del tiempo, por favor llame a nuestra lnea principal   al [phone] para una actualizacin sobre el estado de cualquier retraso o cierre.  Consejos para la medicacin en dermatologa: Por favor, guarde las cajas en las que vienen los medicamentos de uso tpico para ayudarle a seguir las instrucciones sobre dnde y cmo usarlos. Las farmacias generalmente imprimen las instrucciones del medicamento slo en las cajas y no directamente en los tubos del medicamento.   Si su medicamento es muy caro, por favor, pngase en contacto con nuestra oficina llamando al [phone] y presione la opcin 4 o envenos un mensaje a travs de MyChart.   No podemos decirle cul ser su copago por los medicamentos por adelantado ya que esto es diferente dependiendo de la cobertura de su seguro. Sin embargo, es posible que podamos encontrar un medicamento sustituto a menor costo o llenar un formulario para que el seguro cubra el medicamento que se considera necesario.   Si se requiere una autorizacin previa para que su compaa de seguros cubra su medicamento, por favor  permtanos de 1 a 2 das hbiles para completar este proceso.  Los precios de los medicamentos varan con frecuencia dependiendo del lugar de dnde se surte la receta y alguna farmacias pueden ofrecer precios ms baratos.  El sitio web www.goodrx.com tiene cupones para medicamentos de diferentes farmacias. Los precios aqu no tienen en cuenta lo que podra costar con la ayuda del seguro (puede ser ms barato con su seguro), pero el sitio web puede darle el precio si no utiliz ningn seguro.  - Puede imprimir el cupn correspondiente y llevarlo con su receta a la farmacia.  - Tambin puede pasar por nuestra oficina durante el horario de atencin regular y recoger una tarjeta de cupones de GoodRx.  - Si necesita que su receta se enve electrnicamente a una farmacia diferente, informe a nuestra oficina a travs de MyChart de [place] o por telfono llamando al [phone] y presione la opcin 4.  

## 2022-06-29 NOTE — Progress Notes (Deleted)
   Follow-Up Visit   Subjective  Stacy Reese is a 77 y.o. female who presents for the following: Skin Cancer Screening and Full Body Skin Exam. Hx of multiple SCCs and KA-type SCCs. Areas of concern on face and legs  The patient presents for Total-Body Skin Exam (TBSE) for skin cancer screening and mole check. The patient has spots, moles and lesions to be evaluated, some may be new or changing and the patient has concerns that these could be cancer.    The following portions of the chart were reviewed this encounter and updated as appropriate: medications, allergies, medical history  Review of Systems:  No other skin or systemic complaints except as noted in HPI or Assessment and Plan.  Objective  Well appearing patient in no apparent distress; mood and affect are within normal limits.  A full examination was performed including scalp, head, eyes, ears, nose, lips, neck, chest, axillae, abdomen, back, buttocks, bilateral upper extremities, bilateral lower extremities, hands, feet, fingers, toes, fingernails, and toenails. All findings within normal limits unless otherwise noted below.   Relevant physical exam findings are noted in the Assessment and Plan.    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  - Call if any new or changing lesions are noted between office visits     LENTIGINES, SEBORRHEIC KERATOSES, HEMANGIOMAS - Benign normal skin lesions - Benign-appearing - Call for any changes  MELANOCYTIC NEVI - Tan-brown and/or pink-flesh-colored symmetric macules and papules - Benign appearing on exam today - Observation - Call clinic for new or changing moles - Recommend daily use of broad spectrum spf 30+ sunscreen to  sun-exposed areas.   ACTINIC DAMAGE - Chronic condition, secondary to cumulative UV/sun exposure - diffuse scaly erythematous macules with underlying dyspigmentation - Recommend daily broad spectrum sunscreen SPF 30+ to sun-exposed areas, reapply every 2 hours as needed.  - Staying in the shade or wearing long sleeves, sun glasses (UVA+UVB protection) and wide brim hats (4-inch brim around the entire circumference of the hat) are also recommended for sun protection.  - Call for new or changing lesions.  SKIN CANCER SCREENING PERFORMED TODAY.     Return in about 1 year (around 06/29/2023) for TBSE, Hx of SCCs.  I, Lawson Radar, CMA, am acting as scribe for Armida Sans, MD.   Documentation: I have reviewed the above documentation for accuracy and completeness, and I agree with the above.  Armida Sans, MD

## 2022-07-09 ENCOUNTER — Ambulatory Visit: Payer: Medicare HMO | Admitting: Dermatology

## 2022-07-13 ENCOUNTER — Encounter: Payer: Self-pay | Admitting: Dermatology

## 2022-10-12 ENCOUNTER — Ambulatory Visit
Admission: RE | Admit: 2022-10-12 | Discharge: 2022-10-12 | Disposition: A | Discharge status: Home / Self Care | Payer: Medicare HMO | Source: Ambulatory Visit | Attending: Nurse Practitioner | Admitting: Nurse Practitioner

## 2022-10-12 DIAGNOSIS — Z08 Encounter for follow-up examination after completed treatment for malignant neoplasm: Secondary | ICD-10-CM | POA: Diagnosis present

## 2022-10-12 DIAGNOSIS — Z853 Personal history of malignant neoplasm of breast: Secondary | ICD-10-CM | POA: Diagnosis present

## 2022-10-12 DIAGNOSIS — Z1231 Encounter for screening mammogram for malignant neoplasm of breast: Secondary | ICD-10-CM | POA: Insufficient documentation

## 2022-11-12 ENCOUNTER — Other Ambulatory Visit (INDEPENDENT_AMBULATORY_CARE_PROVIDER_SITE_OTHER): Payer: Self-pay | Admitting: Vascular Surgery

## 2022-11-12 DIAGNOSIS — I739 Peripheral vascular disease, unspecified: Secondary | ICD-10-CM

## 2022-11-17 ENCOUNTER — Ambulatory Visit (INDEPENDENT_AMBULATORY_CARE_PROVIDER_SITE_OTHER): Payer: Medicare HMO | Admitting: Vascular Surgery

## 2022-11-17 ENCOUNTER — Ambulatory Visit (INDEPENDENT_AMBULATORY_CARE_PROVIDER_SITE_OTHER): Payer: Medicare HMO

## 2022-11-17 DIAGNOSIS — I739 Peripheral vascular disease, unspecified: Secondary | ICD-10-CM

## 2022-11-24 LAB — VAS US ABI WITH/WO TBI
Left ABI: 1.33
Right ABI: 1.13

## 2023-01-14 ENCOUNTER — Encounter: Payer: Self-pay | Admitting: Dermatology

## 2023-01-14 ENCOUNTER — Ambulatory Visit: Payer: Medicare HMO | Admitting: Dermatology

## 2023-01-14 DIAGNOSIS — W908XXA Exposure to other nonionizing radiation, initial encounter: Secondary | ICD-10-CM | POA: Diagnosis not present

## 2023-01-14 DIAGNOSIS — L570 Actinic keratosis: Secondary | ICD-10-CM

## 2023-01-14 DIAGNOSIS — L578 Other skin changes due to chronic exposure to nonionizing radiation: Secondary | ICD-10-CM | POA: Diagnosis not present

## 2023-01-14 DIAGNOSIS — L209 Atopic dermatitis, unspecified: Secondary | ICD-10-CM

## 2023-01-14 DIAGNOSIS — Z79899 Other long term (current) drug therapy: Secondary | ICD-10-CM

## 2023-01-14 DIAGNOSIS — L2089 Other atopic dermatitis: Secondary | ICD-10-CM

## 2023-01-14 DIAGNOSIS — L309 Dermatitis, unspecified: Secondary | ICD-10-CM

## 2023-01-14 DIAGNOSIS — Z7189 Other specified counseling: Secondary | ICD-10-CM

## 2023-01-14 MED ORDER — TACROLIMUS 0.1 % EX OINT: TOPICAL_OINTMENT | Freq: Two times a day (BID) | CUTANEOUS | 5 refills | Status: AC

## 2023-01-14 NOTE — Patient Instructions (Addendum)
Cryotherapy Aftercare  Wash gently with soap and water everyday.   Apply Vaseline and Band-Aid daily until healed.   Start tacrolimus 1-2 times daily to lower legs as needed for itch. Follow with CeraVe moisturizing cream.  Due to recent changes in healthcare laws, you may see results of your pathology and/or laboratory studies on MyChart before the doctors have had a chance to review them. We understand that in some cases there may be results that are confusing or concerning to you. Please understand that not all results are received at the same time and often the doctors may need to interpret multiple results in order to provide you with the best plan of care or course of treatment. Therefore, we ask that you please give [place] 2 business days to thoroughly review all your results before contacting the office for clarification. Should we see a critical lab result, you will be contacted sooner.   If You Need Anything After Your Visit  If you have any questions or concerns for your doctor, please call our main line at [phone] and press option 4 to reach your doctor's medical assistant. If no one answers, please leave a voicemail as directed and we will return your call as soon as possible. Messages left after 4 pm will be answered the following business day.   You may also send [place] a message via MyChart. We typically respond to MyChart messages within 1-2 business days.  For prescription refills, please ask your pharmacy to contact our office. Our fax number is [phone].  If you have an urgent issue when the clinic is closed that cannot wait until the next business day, you can page your doctor at the number below.    Please note that while we do our best to be available for urgent issues outside of office hours, we are not available 24/7.   If you have an urgent issue and are unable to reach [place], you may choose to seek medical care at your doctor's office, retail clinic, urgent care center,  or emergency room.  If you have a medical emergency, please immediately call 911 or go to the emergency department.  Pager Numbers  - Dr. Gwen Pounds: [phone]  - Dr. Roseanne Reno: [phone]  - Dr. Katrinka Blazing: [phone]   In the event of inclement weather, please call our main line at [phone] for an update on the status of any delays or closures.  Dermatology Medication Tips: Please keep the boxes that topical medications come in in order to help keep track of the instructions about where and how to use these. Pharmacies typically print the medication instructions only on the boxes and not directly on the medication tubes.   If your medication is too expensive, please contact our office at [phone] option 4 or send [place] a message through MyChart.   We are unable to tell what your co-pay for medications will be in advance as this is different depending on your insurance coverage. However, we may be able to find a substitute medication at lower cost or fill out paperwork to get insurance to cover a needed medication.   If a prior authorization is required to get your medication covered by your insurance company, please allow [place] 1-2 business days to complete this process.  Drug prices often vary depending on where the prescription is filled and some pharmacies may offer cheaper prices.  The website www.goodrx.com contains coupons for medications through different pharmacies. The prices here do not account for what the cost  may be with help from insurance (it may be cheaper with your insurance), but the website can give you the price if you did not use any insurance.  - You can print the associated coupon and take it with your prescription to the pharmacy.  - You may also stop by our office during regular business hours and pick up a GoodRx coupon card.  - If you need your prescription sent electronically to a different pharmacy, notify our office through [facility] or by phone  at [phone] option 4.

## 2023-01-14 NOTE — Progress Notes (Deleted)
Follow-Up Visit   Subjective  Stacy Reese is a 77 y.o. female who presents for the following: 6 month AK follow up at legs. Patient having a lot of itching at lower legs.   The patient has spots, moles and lesions to be evaluated, some may be new or changing and the patient may have concern these could be cancer.   The following portions of the chart were reviewed this encounter and updated as appropriate: medications, allergies, medical history  Review of Systems:  No other skin or systemic complaints except as noted in HPI or Assessment and Plan.  Objective  Well appearing patient in no apparent distress; mood and affect are within normal limits.   A focused examination was performed of the following areas: Lower legs  Relevant exam findings are noted in the Assessment and Plan.  R lower leg x 2, L lower leg x 5 (7) Erythematous thin papules/macules with gritty scale.     Assessment & Plan   HISTORY OF SQUAMOUS CELL CARCINOMA OF THE SKIN - No evidence of recurrence today - No lymphadenopathy - Recommend regular full body skin exams - Recommend daily broad spectrum sunscreen SPF 30+ to sun-exposed areas, reapply every 2 hours as needed.  - Call if any new or changing lesions are noted between office visits  SEBORRHEIC KERATOSIS - Stuck-on, waxy, tan-brown papules and/or plaques  - Benign-appearing - Discussed benign etiology and prognosis. - Observe - Call for any changes  ACTINIC DAMAGE - chronic, secondary to cumulative UV radiation exposure/sun exposure over time - diffuse scaly erythematous macules with underlying dyspigmentation - Recommend daily broad spectrum sunscreen SPF 30+ to sun-exposed areas, reapply every 2 hours as needed.  - Recommend staying in the shade or wearing long sleeves, sun glasses (UVA+UVB protection) and wide brim hats (4-inch brim around the entire circumference of the hat). - Call for new or changing lesions.  ATOPIC  DERMATITIS Exam: Scaly pink papules coalescing to plaques     Follow-Up Visit   Subjective  Stacy Reese is a 77 y.o. female who presents for the following: *** The patient has spots, moles and lesions to be evaluated, some may be new or changing and the patient may have concern these could be cancer.   The following portions of the chart were reviewed this encounter and updated as appropriate: medications, allergies, medical history  Review of Systems:  No other skin or systemic complaints except as noted in HPI or Assessment and Plan.  Objective  Well appearing patient in no apparent distress; mood and affect are within normal limits.  ***A full examination was performed including scalp, head, eyes, ears, nose, lips, neck, chest, axillae, abdomen, back, buttocks, bilateral upper extremities, bilateral lower extremities, hands, feet, fingers, toes, fingernails, and toenails. All findings within normal limits unless otherwise noted below.  ***A focused examination was performed of the following areas: ***  Relevant exam findings are noted in the Assessment and Plan.  R lower leg x 2, L lower leg x 5 (7) Erythematous thin papules/macules with gritty scale.     Assessment & Plan     AK (actinic keratosis) (7) R lower leg x 2, L lower leg x 5  Actinic keratoses are precancerous spots that appear secondary to cumulative UV radiation exposure/sun exposure over time. They are chronic with expected duration over 1 year. A portion of actinic keratoses will progress to squamous cell carcinoma of the skin. It is not possible to reliably predict which spots  will progress to skin cancer and so treatment is recommended to prevent development of skin cancer.  Recommend daily broad spectrum sunscreen SPF 30+ to sun-exposed areas, reapply every 2 hours as needed.  Recommend staying in the shade or wearing long sleeves, sun glasses (UVA+UVB protection) and wide brim hats (4-inch brim around  the entire circumference of the hat). Call for new or changing lesions.   Destruction of lesion - R lower leg x 2, L lower leg x 5 (7) Complexity: simple   Destruction method: cryotherapy   Informed consent: discussed and consent obtained   Timeout:  patient name, date of birth, surgical site, and procedure verified Lesion destroyed using liquid nitrogen: Yes   Region frozen until ice ball extended beyond lesion: Yes   Outcome: patient tolerated procedure well with no complications   Post-procedure details: wound care instructions given    Eczema, unspecified type Right Lower Leg - Anterior  ceraVe cream daily  Start tacrolimus 1-2 times daily to lower legs as needed for itch. Follow with CeraVe moisturizing cream.    Return in about 6 months (around 07/15/2023) for AK follow up.  ***  Documentation: I have reviewed the above documentation for accuracy and completeness, and I agree with the above.  Armida Sans, MD     Atopic dermatitis (eczema) is a chronic, relapsing, pruritic condition that can significantly affect quality of life. It is often associated with allergic rhinitis and/or asthma and can require treatment with topical medications, phototherapy, or in severe cases biologic injectable medication (Dupixent; Adbry) or Oral JAK inhibitors.  Treatment Plan: ***  Recommend gentle skin care.     AK (actinic keratosis) (7) R lower leg x 2, L lower leg x 5  Actinic keratoses are precancerous spots that appear secondary to cumulative UV radiation exposure/sun exposure over time. They are chronic with expected duration over 1 year. A portion of actinic keratoses will progress to squamous cell carcinoma of the skin. It is not possible to reliably predict which spots will progress to skin cancer and so treatment is recommended to prevent development of skin cancer.  Recommend daily broad spectrum sunscreen SPF 30+ to sun-exposed areas, reapply every 2 hours as  needed.  Recommend staying in the shade or wearing long sleeves, sun glasses (UVA+UVB protection) and wide brim hats (4-inch brim around the entire circumference of the hat). Call for new or changing lesions.   Destruction of lesion - R lower leg x 2, L lower leg x 5 (7) Complexity: simple   Destruction method: cryotherapy   Informed consent: discussed and consent obtained   Timeout:  patient name, date of birth, surgical site, and procedure verified Lesion destroyed using liquid nitrogen: Yes   Region frozen until ice ball extended beyond lesion: Yes   Outcome: patient tolerated procedure well with no complications   Post-procedure details: wound care instructions given    Eczema, unspecified type Right Lower Leg - Anterior  ceraVe cream daily  Start tacrolimus 1-2 times daily to lower legs as needed for itch. Follow with CeraVe moisturizing cream.    Return in about 6 months (around 07/15/2023) for AK follow up.  Anise Salvo, RMA, am acting as scribe for Armida Sans, MD .   Documentation: I have reviewed the above documentation for accuracy and completeness, and I agree with the above.  Armida Sans, MD

## 2023-02-01 NOTE — Progress Notes (Signed)
   Follow-Up Visit   Subjective  Stacy Reese is a 77 y.o. female who presents for the following: 6 month AK follow up. Patient still with itching at lower legs.  The patient has spots, moles and lesions to be evaluated, some may be new or changing and the patient may have concern these could be cancer.   The following portions of the chart were reviewed this encounter and updated as appropriate: medications, allergies, medical history  Review of Systems:  No other skin or systemic complaints except as noted in HPI or Assessment and Plan.  Objective  Well appearing patient in no apparent distress; mood and affect are within normal limits.   A focused examination was performed of the following areas: Lower legs  Relevant exam findings are noted in the Assessment and Plan.  R lower leg x 2, L lower leg x 5 (7) Erythematous thin papules/macules with gritty scale.     Assessment & Plan     AK (actinic keratosis) (77) R lower leg x 2, L lower leg x 5  Actinic keratoses are precancerous spots that appear secondary to cumulative UV radiation exposure/sun exposure over time. They are chronic with expected duration over 1 year. A portion of actinic keratoses will progress to squamous cell carcinoma of the skin. It is not possible to reliably predict which spots will progress to skin cancer and so treatment is recommended to prevent development of skin cancer.  Recommend daily broad spectrum sunscreen SPF 30+ to sun-exposed areas, reapply every 2 hours as needed.  Recommend staying in the shade or wearing long sleeves, sun glasses (UVA+UVB protection) and wide brim hats (4-inch brim around the entire circumference of the hat). Call for new or changing lesions.   Destruction of lesion - R lower leg x 2, L lower leg x 5 (7) Complexity: simple   Destruction method: cryotherapy   Informed consent: discussed and consent obtained   Timeout:  patient name, date of birth, surgical site, and  procedure verified Lesion destroyed using liquid nitrogen: Yes   Region frozen until ice ball extended beyond lesion: Yes   Outcome: patient tolerated procedure well with no complications   Post-procedure details: wound care instructions given    Eczema, Atopic Dermatitis Right Lower Leg - Anterior Scaly pinkness Treatment: ceraVe cream daily Start tacrolimus 1-2 times daily to lower legs as needed for itch. Follow with CeraVe moisturizing cream.   ACTINIC DAMAGE - chronic, secondary to cumulative UV radiation exposure/sun exposure over time - diffuse scaly erythematous macules with underlying dyspigmentation - Recommend daily broad spectrum sunscreen SPF 30+ to sun-exposed areas, reapply every 2 hours as needed.  - Recommend staying in the shade or wearing long sleeves, sun glasses (UVA+UVB protection) and wide brim hats (4-inch brim around the entire circumference of the hat). - Call for new or changing lesions.  Return in about 6 months (around 07/15/2023) for AK follow up.  Anise Salvo, RMA, am acting as scribe for Armida Sans, MD .   Documentation: I have reviewed the above documentation for accuracy and completeness, and I agree with the above.  Armida Sans, MD

## 2023-05-12 ENCOUNTER — Encounter: Payer: Self-pay | Admitting: Ophthalmology

## 2023-05-13 ENCOUNTER — Encounter: Payer: Self-pay | Admitting: Ophthalmology

## 2023-05-13 NOTE — Anesthesia Preprocedure Evaluation (Addendum)
 Anesthesia Evaluation  Patient identified by MRN, date of birth, ID band Patient awake    Reviewed: Allergy & Precautions, H&P , NPO status , Patient's Chart, lab work & pertinent test results  Airway Mallampati: III  TM Distance: >3 FB Neck ROM: Full    Dental no notable dental hx. (+) Edentulous Upper, Edentulous Lower   Pulmonary shortness of breath, former smoker   Pulmonary exam normal breath sounds clear to auscultation       Cardiovascular hypertension, + Peripheral Vascular Disease and +CHF  Normal cardiovascular exam Rhythm:Irregular Rate:Normal  05-04-21  Indications:     Diastolic CHF    History:         Patient has no prior history of Echocardiogram  examinations.                  Arrythmias:Atrial Fibrillation; Risk  Factors:Hypertension,                  Diabetes and Pulm Embolism.    Sonographer:     L Thornton-Maynard  Referring Phys:  [id number] Vernetta Honey MANSY  Diagnosing Phys: Marcina Millard MD     Sonographer Comments: Suboptimal apical window.  IMPRESSIONS     1. Left ventricular ejection fraction, by estimation, is 50 to 55%. The  left ventricle has low normal function. The left ventricle has no regional  wall motion abnormalities. Left ventricular diastolic parameters are  indeterminate.   2. Right ventricular systolic function is normal. The right ventricular  size is normal. There is mildly elevated pulmonary artery systolic  pressure.   3. The mitral valve is normal in structure. Mild mitral valve  regurgitation. No evidence of mitral stenosis.   4. The aortic valve is normal in structure. Aortic valve regurgitation is  not visualized. No aortic stenosis is present.   5. The inferior vena cava is normal in size with greater than 50%  respiratory variability, suggesting right atrial pressure of 3 mmHg.      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 Acute diastolic congestive heart failure 05/03/2021 NYHA class II-III    Reproductive/Obstetrics negative OB ROS                             Anesthesia Physical Anesthesia Plan  ASA: 4  Anesthesia Plan: MAC   Post-op Pain Management:    Induction: Intravenous  PONV Risk Score and Plan:   Airway Management Planned: Natural Airway and Nasal Cannula  Additional Equipment:   Intra-op Plan:   Post-operative Plan:   Informed Consent: I have reviewed the patients History and Physical, chart, labs and discussed the procedure including the risks, benefits and alternatives for the proposed anesthesia with the patient or authorized representative who has indicated his/her understanding and acceptance.     Dental Advisory Given  Plan Discussed with: Anesthesiologist, CRNA and Surgeon  Anesthesia Plan Comments: (Patient consented for risks of anesthesia including but not limited to:  -  adverse reactions to medications - damage to eyes, teeth, lips or other oral mucosa - nerve damage due to positioning  - sore throat or hoarseness - Damage to heart, brain, nerves, lungs, other parts of body or loss of life  Patient voiced understanding and assent.)       Anesthesia Quick Evaluation

## 2023-05-17 NOTE — Discharge Instructions (Signed)

## 2023-05-19 ENCOUNTER — Ambulatory Visit: Payer: Medicare HMO | Admitting: Anesthesiology

## 2023-05-19 ENCOUNTER — Other Ambulatory Visit: Payer: Self-pay

## 2023-05-19 ENCOUNTER — Ambulatory Visit
Admission: RE | Admit: 2023-05-19 | Discharge: 2023-05-19 | Disposition: A | Discharge status: Home / Self Care | Payer: Medicare HMO | Attending: Ophthalmology | Admitting: Ophthalmology

## 2023-05-19 ENCOUNTER — Encounter: Payer: Self-pay | Admitting: Ophthalmology

## 2023-05-19 ENCOUNTER — Encounter
Admission: RE | Disposition: A | Discharge status: Home / Self Care | Payer: Self-pay | Source: Home / Self Care | Attending: Ophthalmology

## 2023-05-19 DIAGNOSIS — H2512 Age-related nuclear cataract, left eye: Secondary | ICD-10-CM | POA: Diagnosis present

## 2023-05-19 DIAGNOSIS — I4891 Unspecified atrial fibrillation: Secondary | ICD-10-CM | POA: Diagnosis not present

## 2023-05-19 DIAGNOSIS — I110 Hypertensive heart disease with heart failure: Secondary | ICD-10-CM | POA: Diagnosis not present

## 2023-05-19 DIAGNOSIS — Z87891 Personal history of nicotine dependence: Secondary | ICD-10-CM | POA: Insufficient documentation

## 2023-05-19 DIAGNOSIS — I5032 Chronic diastolic (congestive) heart failure: Secondary | ICD-10-CM | POA: Insufficient documentation

## 2023-05-19 DIAGNOSIS — E1151 Type 2 diabetes mellitus with diabetic peripheral angiopathy without gangrene: Secondary | ICD-10-CM | POA: Insufficient documentation

## 2023-05-19 HISTORY — DX: Presence of dental prosthetic device (complete) (partial): Z97.2

## 2023-05-19 HISTORY — PX: CATARACT EXTRACTION W/PHACO: SHX586

## 2023-05-19 HISTORY — DX: Deep phlebothrombosis in pregnancy, unspecified trimester: O22.30

## 2023-05-19 HISTORY — DX: Unspecified diastolic (congestive) heart failure: I50.30

## 2023-05-19 SURGERY — PHACOEMULSIFICATION, CATARACT, WITH IOL INSERTION
Anesthesia: Monitor Anesthesia Care | Site: Eye | Laterality: Left

## 2023-05-19 MED ORDER — MIDAZOLAM HCL 2 MG/2ML IJ SOLN
INTRAMUSCULAR | Status: DC | PRN
  Administered 2023-05-19: 2 mg via INTRAVENOUS

## 2023-05-19 MED ORDER — ARMC OPHTHALMIC DILATING DROPS
OPHTHALMIC | Status: AC
  Filled 2023-05-19: qty 0.5

## 2023-05-19 MED ORDER — ARMC OPHTHALMIC DILATING DROPS
1.0000 | OPHTHALMIC | Status: DC | PRN
  Administered 2023-05-19 (×3): 1 via OPHTHALMIC

## 2023-05-19 MED ORDER — SIGHTPATH DOSE#1 BSS IO SOLN
INTRAOCULAR | Status: DC | PRN
  Administered 2023-05-19: 15 mL via INTRAOCULAR

## 2023-05-19 MED ORDER — BRIMONIDINE TARTRATE-TIMOLOL 0.2-0.5 % OP SOLN
OPHTHALMIC | Status: DC | PRN
  Administered 2023-05-19: 1 [drp] via OPHTHALMIC

## 2023-05-19 MED ORDER — TETRACAINE HCL 0.5 % OP SOLN
OPHTHALMIC | Status: AC
  Filled 2023-05-19: qty 4

## 2023-05-19 MED ORDER — LIDOCAINE HCL (PF) 2 % IJ SOLN
INTRAOCULAR | Status: DC | PRN
  Administered 2023-05-19: 4 mL via INTRAOCULAR

## 2023-05-19 MED ORDER — MIDAZOLAM HCL 2 MG/2ML IJ SOLN
INTRAMUSCULAR | Status: AC
Start: 2023-05-19 — End: ?
  Filled 2023-05-19: qty 2

## 2023-05-19 MED ORDER — MOXIFLOXACIN HCL 0.5 % OP SOLN
OPHTHALMIC | Status: DC | PRN
  Administered 2023-05-19: .2 mL via OPHTHALMIC

## 2023-05-19 MED ORDER — EPINEPHRINE PF 1 MG/ML IJ SOLN
INTRAMUSCULAR | Status: DC | PRN
  Administered 2023-05-19: 81 mL via OPHTHALMIC

## 2023-05-19 MED ORDER — TETRACAINE HCL 0.5 % OP SOLN
1.0000 [drp] | OPHTHALMIC | Status: DC | PRN
  Administered 2023-05-19 (×3): 1 [drp] via OPHTHALMIC

## 2023-05-19 MED ORDER — SIGHTPATH DOSE#1 NA HYALUR & NA CHOND-NA HYALUR IO KIT
PACK | INTRAOCULAR | Status: DC | PRN
  Administered 2023-05-19: 1 via OPHTHALMIC

## 2023-05-19 SURGICAL SUPPLY — 16 items
CANNULA ANT/CHMB 27G (MISCELLANEOUS) IMPLANT
CANNULA ANT/CHMB 27GA (MISCELLANEOUS) IMPLANT
CATARACT SUITE SIGHTPATH (MISCELLANEOUS) ×1 IMPLANT
DISSECTOR HYDRO NUCLEUS 50X22 (MISCELLANEOUS) ×1 IMPLANT
DRSG TEGADERM 2-3/8X2-3/4 SM (GAUZE/BANDAGES/DRESSINGS) ×1 IMPLANT
FEE CATARACT SUITE SIGHTPATH (MISCELLANEOUS) ×1 IMPLANT
GLOVE BIOGEL PI IND STRL 8 (GLOVE) ×1 IMPLANT
GLOVE PI ULTRA LF STRL 7.5 (GLOVE) IMPLANT
GLOVE SURG PROTEXIS BL SZ6.5 (GLOVE) ×1 IMPLANT
GLOVE SURG SYN 6.5 PF PI BL (GLOVE) ×1 IMPLANT
LENS CLAREON WAGON WHEEL 18.0 (Intraocular Lens) ×1 IMPLANT
LENS IOL CLRN WGN WHL 18.0 (Intraocular Lens) IMPLANT
NDL FILTER BLUNT 18X1 1/2 (NEEDLE) ×1 IMPLANT
NEEDLE FILTER BLUNT 18X1 1/2 (NEEDLE) ×1 IMPLANT
RING MALYGIN (MISCELLANEOUS) ×1 IMPLANT
SYR 3ML LL SCALE MARK (SYRINGE) ×1 IMPLANT

## 2023-05-19 NOTE — Op Note (Signed)
 OPERATIVE NOTE  Stacy Reese [id number] 05/19/2023   PREOPERATIVE DIAGNOSIS: Nuclear sclerotic cataract left eye. H25.12   POSTOPERATIVE DIAGNOSIS: Nuclear sclerotic cataract left eye. H25.12   PROCEDURE:  Phacoemusification with posterior chamber intraocular lens placement of the left eye  Ultrasound time: Procedure(s): CATARACT EXTRACTION PHACO AND INTRAOCULAR LENS PLACEMENT (IOC) LEFT 9.15 00:53.4 (Left)  LENS:   Implant Name Type Inv. Item Serial No. Manufacturer Lot No. LRB No. Used Action  LENS [employer] W[employer]ON WHEEL 18.0 - [id number] Intraocular Lens LENS [employer] WAGON WHEEL 18.0 [id number] [employer]  Left 1 Implanted      SURGEON:  Julious Payer. Rolley Sims, MD   ANESTHESIA:  Topical with tetracaine drops, augmented with 1% preservative-free intracameral lidocaine.   COMPLICATIONS:  None.   DESCRIPTION OF PROCEDURE:  The patient was identified in the holding room and transported to the operating room and placed in the supine position under the operating microscope.  The left eye was identified as the operative eye, which was prepped and draped in the usual sterile ophthalmic fashion.   A 1 millimeter clear-corneal paracentesis was made inferotemporally. Preservative-free 1% lidocaine mixed with 1:1,000 bisulfite-free aqueous solution of epinephrine was injected into the anterior chamber. The anterior chamber was then filled with Viscoat viscoelastic. A 2.4 millimeter keratome was used to make a clear-corneal incision superotemporally. A curvilinear capsulorrhexis was made with a cystotome and capsulorrhexis forceps. Balanced salt solution was used to hydrodissect and hydrodelineate the nucleus. Phacoemulsification was then used to remove the lens nucleus and epinucleus. The remaining cortex was then removed using the irrigation and aspiration handpiece. Provisc was then placed into the capsular bag to distend it for lens placement. A +18.00 D [id number] intraocular lens was then injected into  the capsular bag. The remaining viscoelastic was aspirated.   Wounds were hydrated with balanced salt solution.  The anterior chamber was inflated to a physiologic pressure with balanced salt solution.  No wound leaks were noted. Moxifloxacin was injected intracamerally.  Timolol and Brimonidine drops were applied to the eye.  The patient was taken to the recovery room in stable condition without complications of anesthesia or surgery.  [employer] 05/19/2023, 9:08 AM

## 2023-05-19 NOTE — Anesthesia Postprocedure Evaluation (Signed)
 Anesthesia Post Note  Patient: Stacy Reese  Procedure(s) Performed: CATARACT EXTRACTION PHACO AND INTRAOCULAR LENS PLACEMENT (IOC) LEFT 9.15 00:53.4 (Left: Eye)  Patient location during evaluation: PACU Anesthesia Type: MAC Level of consciousness: awake and alert Pain management: pain level controlled Vital Signs Assessment: post-procedure vital signs reviewed and stable Respiratory status: spontaneous breathing, nonlabored ventilation, respiratory function stable and patient connected to nasal cannula oxygen Cardiovascular status: stable and blood pressure returned to baseline Postop Assessment: no apparent nausea or vomiting Anesthetic complications: no   No notable events documented.   Last Vitals:  Vitals:   05/19/23 0910 05/19/23 0914  BP: (!) 141/63 132/83  Pulse: 78 99  Resp: (!) 21 16  Temp:  (!) 36.3 C  SpO2: 97% 95%    Last Pain:  Vitals:   05/19/23 0914  TempSrc:   PainSc: 0-No pain                 Annaya Bangert C Caretha Rumbaugh

## 2023-05-19 NOTE — Transfer of Care (Signed)
 Immediate Anesthesia Transfer of Care Note  Patient: Stacy Reese  Procedure(s) Performed: CATARACT EXTRACTION PHACO AND INTRAOCULAR LENS PLACEMENT (IOC) LEFT 9.15 00:53.4 (Left: Eye)  Patient Location: PACU  Anesthesia Type: MAC  Level of Consciousness: awake, alert  and patient cooperative  Airway and Oxygen Therapy: Patient Spontanous Breathing and Patient connected to supplemental oxygen  Post-op Assessment: Post-op Vital signs reviewed, Patient's Cardiovascular Status Stable, Respiratory Function Stable, Patent Airway and No signs of Nausea or vomiting  Post-op Vital Signs: Reviewed and stable  Complications: No notable events documented.

## 2023-05-19 NOTE — H&P (Signed)
 [facility]   Primary Care Physician:  Lauro Regulus, MD Ophthalmologist: Dr. Deberah Pelton  Pre-Procedure History & Physical: HPI:  Stacy Reese is a 78 y.o. female here for cataract surgery.   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 08/22/2007   L pretibial inf   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 01/30/2010   R inf pretibial - ED&C    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 04/14/2016   L mid med pretibial    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 05/20/2016   L mid to distal med pretibial    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 07/20/2016   L mid med pretibial    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 05/10/2017   L mid pretibial    Personal history of chemotherapy    1 round   Personal history of radiation therapy    Skin cancer    squamous cell   Squamous cell carcinoma of skin 03/24/2006   L sup pretibial    Squamous cell carcinoma of skin 03/24/2006   R pretibial inf    Squamous cell carcinoma of skin 05/25/2006   L lat mid lower leg    Squamous cell carcinoma of skin 06/29/2006   R pretibial lat    Squamous cell carcinoma of  skin 10/17/2012   R prox pretibial ED&C   Squamous cell carcinoma of skin 12/19/2012   R prox sup pretibial    Squamous cell carcinoma of skin 02/03/2016   L med mid pretibial    Squamous cell carcinoma of skin 08/20/2016   L pretibial sup    Squamous cell carcinoma of skin 08/20/2016   L pretibial inf    Squamous cell carcinoma of skin 10/10/2018   L prox med pretibial    Squamous cell carcinoma of skin 06/29/2019   L calf    Wears dentures    full upper and lower    Past Surgical History:  Procedure Laterality Date   BREAST BIOPSY Left 09/04/2013   negative stereo bx   BREAST BIOPSY Left 08/25/2013   postive [place] core bx   BREAST LUMPECTOMY Left 08/2017   BREAST LUMPECTOMY W/ NEEDLE LOCALIZATION Left 2015   ESOPHAGOGASTRODUODENOSCOPY (EGD) WITH PROPOFOL N/A 08/05/2018   Procedure: ESOPHAGOGASTRODUODENOSCOPY (EGD) WITH PROPOFOL;  Surgeon: Midge Minium, MD;  Location: [facility] ENDOSCOPY;  Service: Endoscopy;  Laterality: N/A;   GANGLION CYST EXCISION     OPEN REDUCTION INTERNAL FIXATION (ORIF) DISTAL RADIAL FRACTURE Left 07/11/2019   Procedure: OPEN REDUCTION INTERNAL FIXATION (ORIF) DISTAL RADIAL FRACTURE;  Surgeon: Kennedy Bucker, MD;  Location: [facility] ORS;  Service:  Orthopedics;  Laterality: Left;    Prior to Admission medications   Medication Sig Start Date End Date Taking? Authorizing Provider  albuterol (VENTOLIN HFA) 108 (90 Base) MCG/ACT inhaler Inhale into the lungs every 6 (six) hours as needed for wheezing or shortness of breath.   Yes [provider]  busPIRone (BUSPAR) 10 MG tablet Take 10 mg by mouth daily.    Yes [provider]  citalopram (CELEXA) 20 MG tablet Take 20 mg by mouth daily.    Yes [provider]  cyanocobalamin (VITAMIN B12) 1000 MCG tablet Take 1,000 mcg by mouth daily.   Yes [provider]  diltiazem (TIAZAC) 240 MG 24 hr capsule Take 240 mg by mouth daily.  07/13/14  Yes [provider]  furosemide (LASIX) 40 MG  tablet Take 40 mg by mouth daily as needed.   Yes [provider]  Multiple Vitamins-Minerals (VISION FORMULA PO) Take by mouth daily.   Yes [provider]  pantoprazole (PROTONIX) 40 MG tablet Take 1 tablet (40 mg total) by mouth daily. 08/08/18 05/12/23 Yes Auburn Bilberry, MD  pravastatin (PRAVACHOL) 40 MG tablet Take 40 mg by mouth daily.    Yes [provider]  warfarin (COUMADIN) 1 MG tablet Take 1 mg by mouth daily. This week 1 pill daily for 7 days   Yes [provider]  warfarin (COUMADIN) 5 MG tablet Take 5 mg by mouth See admin instructions. Takes along with a 2mg  to equal 7mg  daily   Yes [provider]  LORazepam (ATIVAN) 1 MG tablet Take 1 mg by mouth daily as needed for anxiety.  Patient not taking: Reported on 05/20/2021    [provider]  tacrolimus (PROTOPIC) 0.1 % ointment Apply topically 2 (two) times daily. To lower legs as needed. 01/14/23   Deirdre Evener, MD    Allergies as of 04/29/2023 - Review Complete 01/14/2023  Allergen Reaction Noted   Metoprolol Shortness Of Breath 07/11/2014   Other Other (See Comments) 07/11/2014    Family History  Problem Relation Age of Onset   Cancer Father        colon   CAD Father    Hypertension Father    Hyperlipidemia Father    CAD Mother    Breast cancer Neg Hx     Social History   Socioeconomic History   Marital status: Widowed    Spouse name: Not on file   Number of children: Not on file   Years of education: Not on file   Highest education level: Not on file  Occupational History   Not on file  Tobacco Use   Smoking status: Former    Current packs/day: 0.00    Average packs/day: 1 pack/day for 35.0 years (35.0 ttl pk-yrs)    Types: Cigarettes    Start date: 49    Quit date: 2000    Years since quitting: 25.1   Smokeless tobacco: Never  Vaping Use   Vaping status: Never Used  Substance and Sexual Activity   Alcohol use: Not Currently    Comment:  social   Drug use: No   Sexual activity: Not on file  Other Topics Concern   Not on file  Social History Narrative   Not on file   Social Drivers of Health   Financial Resource Strain: Low Risk  (05/18/2023)   Received from [facility]   Overall Financial Resource Strain (CARDIA)    Difficulty of Paying Living Expenses: Not  hard at all  Food Insecurity: No Food Insecurity (05/18/2023)   Received from [facility]   Hunger Vital Sign    Worried About Running Out of Food in the Last Year: Never true    Ran Out of Food in the Last Year: Never true  Transportation Needs: No Transportation Needs (05/18/2023)   Received from [facility] - Transportation    In the past 12 months, has lack of transportation kept you from medical appointments or from getting medications?: No    Lack of Transportation (Non-Medical): No  Physical Activity: Not on file  Stress: Not on file  Social Connections: Not on file  Intimate Partner Violence: Not on file    Review of Systems: See HPI, otherwise negative ROS  Physical Exam: Ht 5\' 5"  (1.651 m)   Wt 90.7 kg   BMI 33.28 kg/m  General:   Alert, cooperative in NAD Head:  Normocephalic and atraumatic. Respiratory:  Normal work of breathing. Cardiovascular:  RRR  Impression/Plan: Stacy Reese is here for cataract surgery.  Risks, benefits, limitations, and alternatives regarding cataract surgery have been reviewed with the patient.  Questions have been answered.  All parties agreeable.   Estanislado Pandy, MD  05/19/2023, 7:09 AM

## 2023-05-20 ENCOUNTER — Encounter: Payer: Self-pay | Admitting: Ophthalmology

## 2023-05-24 ENCOUNTER — Encounter: Payer: Self-pay | Admitting: Ophthalmology

## 2023-05-24 NOTE — Anesthesia Preprocedure Evaluation (Addendum)
 Anesthesia Evaluation    Airway Mallampati: III  TM Distance: >3 FB     Dental  (+) Edentulous Upper, Edentulous Lower   Pulmonary former smoker          Cardiovascular hypertension,   05-04-21  Indications:     Diastolic CHF    History:         Patient has no prior history of Echocardiogram  examinations.                  Arrythmias:Atrial Fibrillation; Risk  Factors:Hypertension,                  Diabetes and Pulm Embolism.    Sonographer:     L Thornton-Maynard  Referring Phys:  [id number] Vernetta Honey MANSY  Diagnosing Phys: Marcina Millard MD      Sonographer Comments: Suboptimal apical window.  IMPRESSIONS       1. Left ventricular ejection fraction, by estimation, is 50 to 55%. The  left ventricle has low normal function. The left ventricle has no regional  wall motion abnormalities. Left ventricular diastolic parameters are  indeterminate.   2. Right ventricular systolic function is normal. The right ventricular  size is normal. There is mildly elevated pulmonary artery systolic  pressure.   3. The mitral valve is normal in structure. Mild mitral valve  regurgitation. No evidence of mitral stenosis.   4. The aortic valve is normal in structure. Aortic valve regurgitation is  not visualized. No aortic stenosis is present.   5. The inferior vena cava is normal in size with greater than 50%  respiratory variability, suggesting right atrial pressure of 3 mmHg.           Neuro/Psych    GI/Hepatic   Endo/Other    Renal/GU      Musculoskeletal   Abdominal   Peds  Hematology   Anesthesia Other Findings Hypertension  Hypercholesterolemia Personal history of radiation therapy  Skin cancer Personal history of chemotherapy  Atrial fibrillation (HCC)  Hx DVT when pregnant Previous cataract 05-19-23 Dr. Juel Burrow anesthesiologist  Reproductive/Obstetrics                               Anesthesia Physical Anesthesia Plan  ASA: 4  Anesthesia Plan: MAC   Post-op Pain Management:    Induction: Intravenous  PONV Risk Score and Plan:   Airway Management Planned: Natural Airway and Nasal Cannula  Additional Equipment:   Intra-op Plan:   Post-operative Plan:   Informed Consent: I have reviewed the patients History and Physical, chart, labs and discussed the procedure including the risks, benefits and alternatives for the proposed anesthesia with the patient or authorized representative who has indicated his/her understanding and acceptance.     Dental Advisory Given  Plan Discussed with: Anesthesiologist, CRNA and Surgeon  Anesthesia Plan Comments: (Patient consented for risks of anesthesia including but not limited to:  - adverse reactions to medications - damage to eyes, teeth, lips or other oral mucosa - nerve damage due to positioning  - sore throat or hoarseness - Damage to heart, brain, nerves, lungs, other parts of body or loss of life  Patient voiced understanding and assent.)         Anesthesia Quick Evaluation

## 2023-05-25 ENCOUNTER — Encounter: Payer: Self-pay | Admitting: Oncology

## 2023-05-25 ENCOUNTER — Inpatient Hospital Stay: Payer: Medicare HMO | Attending: Oncology | Admitting: Oncology

## 2023-05-25 VITALS — BP 142/94 | HR 73 | Temp 98.3°F | Resp 16 | Ht 65.0 in | Wt 204.8 lb

## 2023-05-25 DIAGNOSIS — Z87891 Personal history of nicotine dependence: Secondary | ICD-10-CM | POA: Diagnosis not present

## 2023-05-25 DIAGNOSIS — I110 Hypertensive heart disease with heart failure: Secondary | ICD-10-CM | POA: Insufficient documentation

## 2023-05-25 DIAGNOSIS — D6851 Activated protein C resistance: Secondary | ICD-10-CM | POA: Diagnosis not present

## 2023-05-25 DIAGNOSIS — Z08 Encounter for follow-up examination after completed treatment for malignant neoplasm: Secondary | ICD-10-CM

## 2023-05-25 DIAGNOSIS — C50512 Malignant neoplasm of lower-outer quadrant of left female breast: Secondary | ICD-10-CM | POA: Diagnosis present

## 2023-05-25 DIAGNOSIS — I5032 Chronic diastolic (congestive) heart failure: Secondary | ICD-10-CM | POA: Insufficient documentation

## 2023-05-25 DIAGNOSIS — Z86718 Personal history of other venous thrombosis and embolism: Secondary | ICD-10-CM | POA: Diagnosis not present

## 2023-05-25 DIAGNOSIS — Z170 Estrogen receptor positive status [ER+]: Secondary | ICD-10-CM | POA: Diagnosis not present

## 2023-05-25 DIAGNOSIS — I4891 Unspecified atrial fibrillation: Secondary | ICD-10-CM | POA: Diagnosis not present

## 2023-05-25 DIAGNOSIS — Z7901 Long term (current) use of anticoagulants: Secondary | ICD-10-CM | POA: Diagnosis not present

## 2023-05-25 DIAGNOSIS — Z85828 Personal history of other malignant neoplasm of skin: Secondary | ICD-10-CM | POA: Insufficient documentation

## 2023-05-25 DIAGNOSIS — Z853 Personal history of malignant neoplasm of breast: Secondary | ICD-10-CM

## 2023-05-25 DIAGNOSIS — Z79899 Other long term (current) drug therapy: Secondary | ICD-10-CM | POA: Diagnosis not present

## 2023-05-25 DIAGNOSIS — Z9221 Personal history of antineoplastic chemotherapy: Secondary | ICD-10-CM | POA: Diagnosis not present

## 2023-05-25 DIAGNOSIS — Z86711 Personal history of pulmonary embolism: Secondary | ICD-10-CM | POA: Diagnosis not present

## 2023-05-25 DIAGNOSIS — Z79621 Long term (current) use of calcineurin inhibitor: Secondary | ICD-10-CM | POA: Insufficient documentation

## 2023-05-25 NOTE — Progress Notes (Signed)
 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  Heterozygote factor V 5 Leiden.   INTERVAL HISTORY: Patient returns to clinic for yearly follow-up.  She currently feels well and is asymptomatic.  She continues to take Coumadin for her factor V Leiden and her INR's are monitored by primary care.  She has no neurologic complaints. She denies any recent fevers or illnesses. She denies any chest pain, cough, hemoptysis, or shortness of breath. She denies any nausea, vomiting, constipation or diarrhea. She has no melena or hematochezia. She has no urinary complaints.  Patient offers no specific complaints today.  REVIEW OF SYSTEMS:   Review of Systems  Constitutional: Negative.  Negative for fever, malaise/fatigue and weight loss.  Respiratory: Negative.  Negative for cough and shortness of breath.   Cardiovascular: Negative.  Negative for chest pain and leg swelling.  Gastrointestinal: Negative.  Negative for abdominal pain, blood in stool and melena.  Genitourinary: Negative.  Negative for hematuria.  Musculoskeletal: Negative.  Negative for back pain.  Skin: Negative.  Negative for rash.  Neurological: Negative.  Negative for focal weakness, weakness and headaches.  Endo/Heme/Allergies:  Does not bruise/bleed easily.  Psychiatric/Behavioral: Negative.  The patient is not nervous/anxious.     As per HPI. Otherwise, a complete review of systems is negative.  PAST MEDICAL HISTORY: Past Medical History:  Diagnosis Date   Actinic keratosis 06/29/2006   R pretibial mid, and med (bx proven)   Atrial fibrillation (HCC)    Breast cancer (HCC) 08/2013   ER positive adenocarcinoma of Left  Breast. 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 08/22/2007   L pretibial inf   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 01/30/2010   R inf pretibial - ED&C    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 04/14/2016   L mid med pretibial    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 05/20/2016   L mid to distal med pretibial    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 07/20/2016   L mid med pretibial    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 05/10/2017   L mid pretibial    Personal history of chemotherapy    1 round   Personal history of radiation therapy    Skin cancer    squamous cell   Squamous cell carcinoma of skin 03/24/2006   L sup pretibial    Squamous cell carcinoma of skin 03/24/2006   R pretibial inf    Squamous cell carcinoma of skin 05/25/2006   L lat mid lower leg    Squamous  cell carcinoma of skin 06/29/2006   R pretibial lat    Squamous cell carcinoma of skin 10/17/2012   R prox pretibial ED&C   Squamous cell carcinoma of skin 12/19/2012   R prox sup pretibial    Squamous cell carcinoma of skin 02/03/2016   L med mid pretibial    Squamous cell carcinoma of skin 08/20/2016   L pretibial sup    Squamous cell carcinoma of skin 08/20/2016   L pretibial inf    Squamous cell carcinoma of skin 10/10/2018   L prox med pretibial    Squamous cell carcinoma of  skin 06/29/2019   L calf    Wears dentures    full upper and lower    PAST SURGICAL HISTORY: Past Surgical History:  Procedure Laterality Date   BREAST BIOPSY Left 09/04/2013   negative stereo bx   BREAST BIOPSY Left 08/25/2013   postive [place] core bx   BREAST LUMPECTOMY Left 08/2017   BREAST LUMPECTOMY W/ NEEDLE LOCALIZATION Left 2015   CATARACT EXTRACTION W/PHACO Left 05/19/2023   Procedure: CATARACT EXTRACTION PHACO AND INTRAOCULAR LENS PLACEMENT (IOC) LEFT 9.15 00:53.4;  Surgeon: Estanislado Pandy, MD;  Location[facility];  Service: Ophthalmology;  Laterality: Left;   ESOPHAGOGASTRODUODENOSCOPY (EGD) WITH PROPOFOL N/A 08/05/2018   Procedure: ESOPHAGOGASTRODUODENOSCOPY (EGD) WITH PROPOFOL;  Surgeon: Midge Minium, MD;  Location: [facility] END[facility];  Service: Endoscopy;  Laterality: N/A;   GANGLION CYST EXCISION     OPEN REDUCTION INTERNAL FIXATION (ORIF) DISTAL RADIAL FRACTURE Left 07/11/2019   Procedure: OPEN REDUCTION INTERNAL FIXATION (ORIF) DISTAL RADIAL FRACTURE;  Surgeon: Kennedy Bucker, MD;  Location: [facility] OR[facility];  Service: Orthopedics;  Laterality: Left;    FAMILY HISTORY Family History  Problem Relation Age of Onset   Cancer Father        colon   CAD Father    Hypertension Father    Hyperlipidemia Father    CAD Mother    Breast cancer Neg Hx        ADVANCED DIRECTIVES:    HEALTH MAINTENANCE: Social History   Tobacco Use   Smoking status: Former    Current packs/day: 0.00    Average packs/day: 1 pack/day for 35.0 years (35.0 ttl pk-yrs)    Types: Cigarettes    Start date: 55    Quit date: 2000    Years since quitting: 25.1   Smokeless tobacco: Never  Vaping Use   Vaping status: Never Used  Substance Use Topics   Alcohol use: Not Currently    Comment: social   Drug use: No     Colonoscopy:  PAP:  Bone density:  Lipid panel:  Allergies  Allergen Reactions   Metoprolol Shortness Of Breath   Other Other (See Comments)    Sodium pentothal   Causes blood clots    Current Outpatient Medications  Medication Sig Dispense Refill   albuterol (VENTOLIN HFA) 108 (90 Base) MCG/ACT inhaler Inhale into the lungs every 6 (six) hours as needed for wheezing or shortness of breath.     busPIRone (BUSPAR) 10 MG tablet Take 10 mg by mouth daily.      citalopram (CELEXA) 20 MG tablet Take 20 mg by mouth daily.      cyanocobalamin (VITAMIN B12) 1000 MCG tablet Take 1,000 mcg by mouth daily.     diltiazem (TIAZAC) 240 MG 24 hr capsule Take 240 mg by mouth daily.      furosemide (LASIX) 40 MG tablet Take 40 mg by mouth daily as needed.  LORazepam (ATIVAN) 1 MG tablet Take 1 mg by mouth daily as needed for anxiety.     losartan (COZAAR) 100 MG tablet Take 100 mg by mouth daily.     Multiple Vitamins-Minerals (VISION FORMULA PO) Take by mouth daily.     pantoprazole (PROTONIX) 40 MG tablet Take 1 tablet (40 mg total) by mouth daily. 30 tablet 11   pravastatin (PRAVACHOL) 40 MG tablet Take 40 mg by mouth daily.      tacrolimus (PROTOPIC) 0.1 % ointment Apply topically 2 (two) times daily. To lower legs as needed. 100 g 5   warfarin (COUMADIN) 1 MG tablet Take 1 mg by mouth daily. This week 1 pill daily for 7 days     warfarin (COUMADIN) 5 MG tablet Take 5 mg by mouth See admin instructions. Takes along with a 2mg  to equal 7mg  daily     No current facility-administered medications for this visit.    OBJECTIVE: Vitals:   05/25/23 1310  BP: (!) 142/94  Pulse: 73  Resp: 16  Temp: 98.3 F (36.8 C)  SpO2: 97%     Body mass index is 34.08 kg/m.    ECOG FS:0 - Asymptomatic  General: Well-developed, well-nourished, no acute distress. Eyes: Pink conjunctiva, anicteric sclera. HEENT: Normocephalic, moist mucous membranes. Lungs: No audible wheezing or coughing. Heart: Regular rate and rhythm. Abdomen: Soft, nontender, no obvious distention. Musculoskeletal: No edema, cyanosis, or clubbing. Neuro: Alert, answering all questions appropriately.  Cranial nerves grossly intact. Skin: No rashes or petechiae noted. Psych: Normal affect.  LAB RESULTS:  Lab Results  Component Value Date   NA 135 05/04/2021   K 4.3 05/04/2021   CL 99 05/04/2021   CO2 27 05/04/2021   GLUCOSE 151 (H) 05/04/2021   BUN 10 05/04/2021   CREATININE 0.81 05/04/2021   CALCIUM 8.6 (L) 05/04/2021   PROT 8.1 05/04/2021   ALBUMIN 3.8 05/04/2021   AST 21 05/04/2021   ALT 17 05/04/2021   ALKPHOS 63 05/04/2021   BILITOT 1.9 (H) 05/04/2021   GFRNONAA >60 05/04/2021   GFRAA >60 07/11/2019    Lab Results  Component Value Date   WBC 7.7 05/04/2021   NEUTROABS 7.2 05/03/2021   HGB 14.5 05/04/2021   HCT 44.5 05/04/2021   MCV 85.7 05/04/2021   PLT 193 05/04/2021     STUDIES: No results found.  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  Heterozygote factor V 5 Leiden.  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 She received one cycle of Taxotere and Cytoxan, but secondary to significant toxicity this was discontinued.  Because of her increased Oncotype score, patient agreed to take a total of 7 years of letrozole which she completed in April 2022.  Her most recent mammogram on October 12, 2022 was reported as BI-RADS 1.  No further intervention is needed.  Patient will require repeat mammogram in July 2025 which can now be ordered by her primary care physician.  No further follow-up is necessary.  Please refer patient back if there are any questions or concerns. Heterozygote factor V 5 Leiden: Patient was diagnosed with PE and DVT at an outside hospital. She completed 6 months of treatment with Xarelto, but given her increased risk she elected to stay on anticoagulation lifelong.  She  is now on Coumadin and INR is managed by her primary  care physician.  Goal INR is 2.0-3.0.    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 T10 soft tissue mass is essentially unchanged since at least May 2020.  Most likely benign process.  No further imaging is necessary.    I spent a total of 20 minutes reviewing chart data, face-to-face evaluation with the patient, counseling and coordination of care as detailed above.   Patient expressed understanding and was in agreement with this plan. She also understands that She can call clinic at any time with any questions, concerns, or complaints.   Breast cancer   Staging form: Breast, AJCC 7th Edition     Clinical stage from 09/22/2014: Stage IA (T1b, N0, M0) - Signed by Jeralyn Ruths, MD on 09/22/2014   Jeralyn Ruths, MD   05/25/2023 1:40 PM

## 2023-06-01 NOTE — Discharge Instructions (Signed)

## 2023-06-03 ENCOUNTER — Ambulatory Visit: Payer: Self-pay | Admitting: Anesthesiology

## 2023-06-03 ENCOUNTER — Other Ambulatory Visit: Payer: Self-pay

## 2023-06-03 ENCOUNTER — Ambulatory Visit
Admission: RE | Admit: 2023-06-03 | Discharge: 2023-06-03 | Disposition: A | Discharge status: Home / Self Care | Payer: Medicare HMO | Attending: Ophthalmology | Admitting: Ophthalmology

## 2023-06-03 ENCOUNTER — Encounter: Payer: Self-pay | Admitting: Ophthalmology

## 2023-06-03 ENCOUNTER — Encounter
Admission: RE | Disposition: A | Discharge status: Home / Self Care | Payer: Self-pay | Source: Home / Self Care | Attending: Ophthalmology

## 2023-06-03 DIAGNOSIS — Z7901 Long term (current) use of anticoagulants: Secondary | ICD-10-CM | POA: Diagnosis not present

## 2023-06-03 DIAGNOSIS — I110 Hypertensive heart disease with heart failure: Secondary | ICD-10-CM | POA: Insufficient documentation

## 2023-06-03 DIAGNOSIS — E1136 Type 2 diabetes mellitus with diabetic cataract: Secondary | ICD-10-CM | POA: Insufficient documentation

## 2023-06-03 DIAGNOSIS — Z86718 Personal history of other venous thrombosis and embolism: Secondary | ICD-10-CM | POA: Diagnosis not present

## 2023-06-03 DIAGNOSIS — I4891 Unspecified atrial fibrillation: Secondary | ICD-10-CM | POA: Diagnosis not present

## 2023-06-03 DIAGNOSIS — H2511 Age-related nuclear cataract, right eye: Secondary | ICD-10-CM | POA: Insufficient documentation

## 2023-06-03 DIAGNOSIS — I5032 Chronic diastolic (congestive) heart failure: Secondary | ICD-10-CM | POA: Insufficient documentation

## 2023-06-03 DIAGNOSIS — Z9221 Personal history of antineoplastic chemotherapy: Secondary | ICD-10-CM | POA: Insufficient documentation

## 2023-06-03 DIAGNOSIS — Z923 Personal history of irradiation: Secondary | ICD-10-CM | POA: Diagnosis not present

## 2023-06-03 DIAGNOSIS — Z85828 Personal history of other malignant neoplasm of skin: Secondary | ICD-10-CM | POA: Insufficient documentation

## 2023-06-03 DIAGNOSIS — Z87891 Personal history of nicotine dependence: Secondary | ICD-10-CM | POA: Insufficient documentation

## 2023-06-03 HISTORY — PX: CATARACT EXTRACTION W/PHACO: SHX586

## 2023-06-03 HISTORY — DX: Complete loss of teeth, unspecified cause, unspecified class: K08.109

## 2023-06-03 SURGERY — PHACOEMULSIFICATION, CATARACT, WITH IOL INSERTION
Anesthesia: Monitor Anesthesia Care | Laterality: Right

## 2023-06-03 MED ORDER — LIDOCAINE HCL (PF) 2 % IJ SOLN
INTRAOCULAR | Status: DC | PRN
  Administered 2023-06-03: 1 mL via INTRAOCULAR

## 2023-06-03 MED ORDER — TETRACAINE HCL 0.5 % OP SOLN
1.0000 [drp] | OPHTHALMIC | Status: DC | PRN
  Administered 2023-06-03 (×3): 1 [drp] via OPHTHALMIC

## 2023-06-03 MED ORDER — ARMC OPHTHALMIC DILATING DROPS
OPHTHALMIC | Status: AC
  Filled 2023-06-03: qty 0.5

## 2023-06-03 MED ORDER — FENTANYL CITRATE (PF) 100 MCG/2ML IJ SOLN
INTRAMUSCULAR | Status: AC
  Filled 2023-06-03: qty 2

## 2023-06-03 MED ORDER — BRIMONIDINE TARTRATE-TIMOLOL 0.2-0.5 % OP SOLN
OPHTHALMIC | Status: DC | PRN
  Administered 2023-06-03: 1 [drp] via OPHTHALMIC

## 2023-06-03 MED ORDER — SIGHTPATH DOSE#1 BSS IO SOLN
INTRAOCULAR | Status: DC | PRN
  Administered 2023-06-03: 73 mL via OPHTHALMIC

## 2023-06-03 MED ORDER — MIDAZOLAM HCL 2 MG/2ML IJ SOLN
INTRAMUSCULAR | Status: AC
  Filled 2023-06-03: qty 2

## 2023-06-03 MED ORDER — MIDAZOLAM HCL 2 MG/2ML IJ SOLN
INTRAMUSCULAR | Status: DC | PRN
  Administered 2023-06-03: 2 mg via INTRAVENOUS

## 2023-06-03 MED ORDER — TETRACAINE HCL 0.5 % OP SOLN
OPHTHALMIC | Status: AC
  Filled 2023-06-03: qty 4

## 2023-06-03 MED ORDER — SIGHTPATH DOSE#1 NA HYALUR & NA CHOND-NA HYALUR IO KIT
PACK | INTRAOCULAR | Status: DC | PRN
  Administered 2023-06-03: 1 via OPHTHALMIC

## 2023-06-03 MED ORDER — ARMC OPHTHALMIC DILATING DROPS
1.0000 | OPHTHALMIC | Status: DC | PRN
  Administered 2023-06-03 (×3): 1 via OPHTHALMIC

## 2023-06-03 MED ORDER — FENTANYL CITRATE (PF) 100 MCG/2ML IJ SOLN
INTRAMUSCULAR | Status: DC | PRN
Start: 2023-06-03 — End: 2023-06-03
  Administered 2023-06-03 (×2): 50 ug via INTRAVENOUS

## 2023-06-03 MED ORDER — SIGHTPATH DOSE#1 BSS IO SOLN
INTRAOCULAR | Status: DC | PRN
  Administered 2023-06-03: 15 mL via INTRAOCULAR

## 2023-06-03 SURGICAL SUPPLY — 13 items
CATARACT SUITE SIGHTPATH (MISCELLANEOUS) ×1 IMPLANT
DISSECTOR HYDRO NUCLEUS 50X22 (MISCELLANEOUS) ×1 IMPLANT
DRSG TEGADERM 2-3/8X2-3/4 SM (GAUZE/BANDAGES/DRESSINGS) ×1 IMPLANT
FEE CATARACT SUITE SIGHTPATH (MISCELLANEOUS) ×1 IMPLANT
GLOVE BIOGEL PI IND STRL 8 (GLOVE) ×1 IMPLANT
GLOVE SURG LX STRL 7.5 STRW (GLOVE) ×1 IMPLANT
GLOVE SURG PROTEXIS BL SZ6.5 (GLOVE) ×1 IMPLANT
GLOVE SURG SYN 6.5 PF PI BL (GLOVE) ×1 IMPLANT
LENS CLAREON WAGON WHEEL 17.0 (Intraocular Lens) ×1 IMPLANT
LENS IOL CLRN WGN WHL 17.0 (Intraocular Lens) IMPLANT
NDL FILTER BLUNT 18X1 1/2 (NEEDLE) ×1 IMPLANT
NEEDLE FILTER BLUNT 18X1 1/2 (NEEDLE) ×1 IMPLANT
SYR 3ML LL SCALE MARK (SYRINGE) ×1 IMPLANT

## 2023-06-03 NOTE — Op Note (Signed)
 OPERATIVE NOTE  Stacy Reese [id number] 06/03/2023   PREOPERATIVE DIAGNOSIS: Nuclear sclerotic cataract right eye. H25.11   POSTOPERATIVE DIAGNOSIS: Nuclear sclerotic cataract right eye. H25.11   PROCEDURE:  Phacoemusification with posterior chamber intraocular lens placement of the right eye  Ultrasound time: Procedure(s): CATARACT EXTRACTION PHACO AND INTRAOCULAR LENS PLACEMENT (IOC) RIGHT  5.08, 00:35.3 (Right)  LENS:   Implant Name Type Inv. Item Serial No. Manufacturer Lot No. LRB No. Used Action  LENS [employer] WAGON WHEEL 17.0 - [id number] Intraocular Lens LENS [employer] WAGON WHEEL 17.0 [id number] [employer]  Right 1 Implanted      SURGEON:  Julious Payer. Rolley Sims, MD   ANESTHESIA:  Topical with tetracaine drops, augmented with 1% preservative-free intracameral lidocaine.   COMPLICATIONS:  None.   DESCRIPTION OF PROCEDURE:  The patient was identified in the holding room and transported to the operating room and placed in the supine position under the operating microscope.  The right eye was identified as the operative eye, which was prepped and draped in the usual sterile ophthalmic fashion.   A 1 millimeter clear-corneal paracentesis was made superotemporally. Preservative-free 1% lidocaine mixed with 1:1,000 bisulfite-free aqueous solution of epinephrine was injected into the anterior chamber. The anterior chamber was then filled with Viscoat viscoelastic. A 2.4 millimeter keratome was used to make a clear-corneal incision inferotemporally. A curvilinear capsulorrhexis was made with a cystotome and capsulorrhexis forceps. Balanced salt solution was used to hydrodissect and hydrodelineate the nucleus. Phacoemulsification was then used to remove the lens nucleus and epinucleus. The remaining cortex was then removed using the irrigation and aspiration handpiece. Provisc was then placed into the capsular bag to distend it for lens placement. A +17.00 D [id number] intraocular lens was then injected  into the capsular bag. The remaining viscoelastic was aspirated.   Wounds were hydrated with balanced salt solution.  The anterior chamber was inflated to a physiologic pressure with balanced salt solution.  No wound leaks were noted. Moxifloxacin was injected intracamerally.  Timolol and Brimonidine drops were applied to the eye.  The patient was taken to the recovery room in stable condition without complications of anesthesia or surgery.  [name] 06/03/2023, 7:53 AM

## 2023-06-03 NOTE — Anesthesia Postprocedure Evaluation (Signed)
 Anesthesia Post Note  Patient: Stacy Reese  Procedure(s) Performed: CATARACT EXTRACTION PHACO AND INTRAOCULAR LENS PLACEMENT (IOC) RIGHT  5.08, 00:35.3 (Right)  Patient location during evaluation: PACU Anesthesia Type: MAC Level of consciousness: awake and alert Pain management: pain level controlled Vital Signs Assessment: post-procedure vital signs reviewed and stable Respiratory status: spontaneous breathing, nonlabored ventilation, respiratory function stable and patient connected to nasal cannula oxygen Cardiovascular status: stable and blood pressure returned to baseline Postop Assessment: no apparent nausea or vomiting Anesthetic complications: no   No notable events documented.   Last Vitals:  Vitals:   06/03/23 0753 06/03/23 0759  BP: 94/69 108/84  Pulse: 78 74  Resp: 14 14  Temp: (!) 36.4 C (!) 36.4 C  SpO2: 94% 91%    Last Pain:  Vitals:   06/03/23 0759  TempSrc:   PainSc: 0-No pain                 Fabiano Ginley C Prajna Vanderpool

## 2023-06-03 NOTE — H&P (Signed)
 [facility]   Primary Care Physician:  Lauro Regulus, MD Ophthalmologist: Dr. Deberah Pelton  Pre-Procedure History & Physical: HPI:  Stacy Reese is a 78 y.o. female here for cataract surgery.   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 08/22/2007   L pretibial inf   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 01/30/2010   R inf pretibial - ED&C    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 04/14/2016   L mid med pretibial    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 05/20/2016   L mid to distal med pretibial    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 07/20/2016   L mid med pretibial    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 05/10/2017   L mid pretibial    Personal history of chemotherapy    1 round   Personal history of radiation therapy    Skin cancer    squamous cell   Squamous cell carcinoma of skin 03/24/2006   L sup pretibial    Squamous cell carcinoma of skin 03/24/2006   R pretibial inf    Squamous cell carcinoma of skin 05/25/2006   L lat mid lower leg    Squamous cell carcinoma of skin 06/29/2006   R pretibial lat    Squamous  cell carcinoma of skin 10/17/2012   R prox pretibial ED&C   Squamous cell carcinoma of skin 12/19/2012   R prox sup pretibial    Squamous cell carcinoma of skin 02/03/2016   L med mid pretibial    Squamous cell carcinoma of skin 08/20/2016   L pretibial sup    Squamous cell carcinoma of skin 08/20/2016   L pretibial inf    Squamous cell carcinoma of skin 10/10/2018   L prox med pretibial    Squamous cell carcinoma of skin 06/29/2019   L calf    Wears dentures    full upper and lower    Past Surgical History:  Procedure Laterality Date   BREAST BIOPSY Left 09/04/2013   negative stereo bx   BREAST BIOPSY Left 08/25/2013   postive [place] core bx   BREAST LUMPECTOMY Left 08/2017   BREAST LUMPECTOMY W/ NEEDLE LOCALIZATION Left 2015   CATARACT EXTRACTION W/PHACO Left 05/19/2023   Procedure: CATARACT EXTRACTION PHACO AND INTRAOCULAR LENS PLACEMENT (IOC) LEFT 9.15 00:53.4;  Surgeon: Estanislado Pandy, MD;  Location: [facility];  Service: Ophthalmology;  Laterality: Left;   ESOPHAGOGASTRODUODENOSCOPY (EGD) WITH PROPOFOL N/A 08/05/2018   Procedure: ESOPHAGOGASTRODUODENOSCOPY (EGD) WITH PROPOFOL;  Surgeon: Midge Minium, MD;  Location: [facility];  Service: Endoscopy;  Laterality:  N/A;   GANGLION CYST EXCISION     OPEN REDUCTION INTERNAL FIXATION (ORIF) DISTAL RADIAL FRACTURE Left 07/11/2019   Procedure: OPEN REDUCTION INTERNAL FIXATION (ORIF) DISTAL RADIAL FRACTURE;  Surgeon: Kennedy Bucker, MD;  Location: [facility];  Service: Orthopedics;  Laterality: Left;    Prior to Admission medications   Medication Sig Start Date End Date Taking? Authorizing Provider  albuterol (VENTOLIN HFA) 108 (90 Base) MCG/ACT inhaler Inhale into the lungs every 6 (six) hours as needed for wheezing or shortness of breath.   Yes [provider]  busPIRone (BUSPAR) 10 MG tablet Take 10 mg by mouth daily.    Yes [provider]  citalopram (CELEXA) 20 MG tablet Take 20 mg by mouth daily.     Yes [provider]  cyanocobalamin (VITAMIN B12) 1000 MCG tablet Take 1,000 mcg by mouth daily.   Yes [provider]  diltiazem (TIAZAC) 240 MG 24 hr capsule Take 240 mg by mouth daily.  07/13/14  Yes [provider]  furosemide (LASIX) 40 MG tablet Take 40 mg by mouth daily as needed.   Yes [provider]  LORazepam (ATIVAN) 1 MG tablet Take 1 mg by mouth daily as needed for anxiety.   Yes [provider]  losartan (COZAAR) 100 MG tablet Take 100 mg by mouth daily. 05/18/23  Yes [provider]  Multiple Vitamins-Minerals (VISION FORMULA PO) Take by mouth daily.   Yes [provider]  pantoprazole (PROTONIX) 40 MG tablet Take 1 tablet (40 mg total) by mouth daily. 08/08/18 06/03/23 Yes Auburn Bilberry, MD  pravastatin (PRAVACHOL) 40 MG tablet Take 40 mg by mouth daily.    Yes [provider]  warfarin (COUMADIN) 1 MG tablet Take 1 mg by mouth daily. This week 1 pill daily for 7 days   Yes [provider]  warfarin (COUMADIN) 5 MG tablet Take 5 mg by mouth See admin instructions. Takes along with a 2mg  to equal 7mg  daily   Yes [provider]  tacrolimus (PROTOPIC) 0.1 % ointment Apply topically 2 (two) times daily. To lower legs as needed. 01/14/23   Deirdre Evener, MD    Allergies as of 04/29/2023 - Review Complete 01/14/2023  Allergen Reaction Noted   Metoprolol Shortness Of Breath 07/11/2014   Other Other (See Comments) 07/11/2014    Family History  Problem Relation Age of Onset   Cancer Father        colon   CAD Father    Hypertension Father    Hyperlipidemia Father    CAD Mother    Breast cancer Neg Hx     Social History   Socioeconomic History   Marital status: Widowed    Spouse name: Not on file   Number of children: Not on file   Years of education: Not on file   Highest education level: Not on file  Occupational History   Not on file  Tobacco Use   Smoking status: Former     Current packs/day: 0.00    Average packs/day: 1 pack/day for 35.0 years (35.0 ttl pk-yrs)    Types: Cigarettes    Start date: 46    Quit date: 2000    Years since quitting: 25.2   Smokeless tobacco: Never  Vaping Use   Vaping status: Never Used  Substance and Sexual Activity   Alcohol use: Not Currently    Comment: social   Drug use: No   Sexual activity: Not on file  Other Topics Concern  Not on file  Social History Narrative   Not on file   Social Drivers of Health   Financial Resource Strain: Low Risk  (05/18/2023)   Received from [facility]   Overall Financial Resource Strain (CARDIA)    Difficulty of Paying Living Expenses: Not hard at all  Food Insecurity: No Food Insecurity (05/18/2023)   Received from [facility]   Hunger Vital Sign    Worried About Running Out of Food in the Last Year: Never true    Ran Out of Food in the Last Year: Never true  Transportation Needs: No Transportation Needs (05/18/2023)   Received from [facility] - Transportation    In the past 12 months, has lack of transportation kept you from medical appointments or from getting medications?: No    Lack of Transportation (Non-Medical): No  Physical Activity: Not on file  Stress: Not on file  Social Connections: Not on file  Intimate Partner Violence: Not on file    Review of Systems: See HPI, otherwise negative ROS  Physical Exam: BP (!) 180/89   Pulse 92   Temp 98.7 F (37.1 C) (Temporal)   Wt 92.1 kg   SpO2 94%   BMI 33.78 kg/m  General:   Alert, cooperative in NAD Head:  Normocephalic and atraumatic. Respiratory:  Normal work of breathing. Cardiovascular:  RRR  Impression/Plan: Stacy Reese is here for cataract surgery.  Risks, benefits, limitations, and alternatives regarding cataract surgery have been reviewed with the patient.  Questions have been answered.  All parties agreeable.   Estanislado Pandy, MD  06/03/2023, 7:03 AM

## 2023-06-03 NOTE — Transfer of Care (Signed)
 Immediate Anesthesia Transfer of Care Note  Patient: Stacy Reese  Procedure(s) Performed: CATARACT EXTRACTION PHACO AND INTRAOCULAR LENS PLACEMENT (IOC) RIGHT  5.08, 00:35.3 (Right)  Patient Location: PACU  Anesthesia Type: MAC  Level of Consciousness: awake, alert  and patient cooperative  Airway and Oxygen Therapy: Patient Spontanous Breathing and Patient connected to supplemental oxygen  Post-op Assessment: Post-op Vital signs reviewed, Patient's Cardiovascular Status Stable, Respiratory Function Stable, Patent Airway and No signs of Nausea or vomiting  Post-op Vital Signs: Reviewed and stable  Complications: No notable events documented.

## 2023-06-04 ENCOUNTER — Encounter: Payer: Self-pay | Admitting: Ophthalmology

## 2023-07-19 ENCOUNTER — Other Ambulatory Visit
Admission: RE | Admit: 2023-07-19 | Discharge: 2023-07-19 | Disposition: A | Discharge status: Home / Self Care | Source: Ambulatory Visit | Attending: Physician Assistant | Admitting: Physician Assistant

## 2023-07-19 DIAGNOSIS — R0601 Orthopnea: Secondary | ICD-10-CM | POA: Insufficient documentation

## 2023-07-19 DIAGNOSIS — I10 Essential (primary) hypertension: Secondary | ICD-10-CM | POA: Diagnosis present

## 2023-07-19 DIAGNOSIS — Z79899 Other long term (current) drug therapy: Secondary | ICD-10-CM | POA: Insufficient documentation

## 2023-07-19 DIAGNOSIS — R0609 Other forms of dyspnea: Secondary | ICD-10-CM | POA: Diagnosis present

## 2023-07-19 LAB — BRAIN NATRIURETIC PEPTIDE: B Natriuretic Peptide: 211.9 pg/mL — ABNORMAL HIGH (ref 0.0–100.0)

## 2023-08-10 ENCOUNTER — Encounter (INDEPENDENT_AMBULATORY_CARE_PROVIDER_SITE_OTHER): Payer: Self-pay

## 2023-08-25 ENCOUNTER — Ambulatory Visit: Payer: Medicare HMO | Admitting: Dermatology

## 2023-08-26 ENCOUNTER — Ambulatory Visit: Admitting: Dermatology

## 2023-08-26 ENCOUNTER — Encounter: Payer: Self-pay | Admitting: Dermatology

## 2023-08-26 DIAGNOSIS — I8393 Asymptomatic varicose veins of bilateral lower extremities: Secondary | ICD-10-CM

## 2023-08-26 DIAGNOSIS — L578 Other skin changes due to chronic exposure to nonionizing radiation: Secondary | ICD-10-CM | POA: Diagnosis not present

## 2023-08-26 DIAGNOSIS — L570 Actinic keratosis: Secondary | ICD-10-CM | POA: Diagnosis not present

## 2023-08-26 DIAGNOSIS — L814 Other melanin hyperpigmentation: Secondary | ICD-10-CM | POA: Diagnosis not present

## 2023-08-26 DIAGNOSIS — D692 Other nonthrombocytopenic purpura: Secondary | ICD-10-CM

## 2023-08-26 DIAGNOSIS — Z85828 Personal history of other malignant neoplasm of skin: Secondary | ICD-10-CM

## 2023-08-26 DIAGNOSIS — L820 Inflamed seborrheic keratosis: Secondary | ICD-10-CM | POA: Diagnosis not present

## 2023-08-26 DIAGNOSIS — L821 Other seborrheic keratosis: Secondary | ICD-10-CM | POA: Diagnosis not present

## 2023-08-26 DIAGNOSIS — Z8589 Personal history of malignant neoplasm of other organs and systems: Secondary | ICD-10-CM

## 2023-08-26 DIAGNOSIS — W908XXA Exposure to other nonionizing radiation, initial encounter: Secondary | ICD-10-CM | POA: Diagnosis not present

## 2023-08-26 DIAGNOSIS — I781 Nevus, non-neoplastic: Secondary | ICD-10-CM

## 2023-08-26 NOTE — Progress Notes (Signed)
 Follow-Up Visit   Subjective  Stacy Reese is a 78 y.o. female who presents for the following: Ak follow up, recheck previously treated lesions on the legs, pt has more lesions on the legs she would like checked today.  The patient has spots, moles and lesions to be evaluated, some may be new or changing.  The following portions of the chart were reviewed this encounter and updated as appropriate: medications, allergies, medical history  Review of Systems:  No other skin or systemic complaints except as noted in HPI or Assessment and Plan.  Objective  Well appearing patient in no apparent distress; mood and affect are within normal limits.  A focused examination was performed of the following areas: the face, arms, and legs  Relevant exam findings are noted in the Assessment and Plan.  B/L leg x 11 (11) Erythematous thin papules/macules with gritty scale.  R forehead x 1 Erythematous stuck-on, waxy papule or plaque  Assessment & Plan   ACTINIC DAMAGE - chronic, secondary to cumulative UV radiation exposure/sun exposure over time - diffuse scaly erythematous macules with underlying dyspigmentation - Recommend daily broad spectrum sunscreen SPF 30+ to sun-exposed areas, reapply every 2 hours as needed.  - Recommend staying in the shade or wearing long sleeves, sun glasses (UVA+UVB protection) and wide brim hats (4-inch brim around the entire circumference of the hat). - Call for new or changing lesions.  LENTIGINES Exam: scattered tan macules Due to sun exposure Treatment Plan: Benign-appearing, observe. Recommend daily broad spectrum sunscreen SPF 30+ to sun-exposed areas, reapply every 2 hours as needed.  Call for any changes  SEBORRHEIC KERATOSIS - Stuck-on, waxy, tan-brown papules and/or plaques  - Benign-appearing - Discussed benign etiology and prognosis. - Observe - Call for any changes  Varicose Veins/Spider Veins - Dilated blue, purple or red veins at the  lower extremities - Reassured - Smaller vessels can be treated by sclerotherapy (a procedure to inject a medicine into the veins to make them disappear) if desired, but the treatment is not covered by insurance. Larger vessels may be covered if symptomatic and we would refer to vascular surgeon if treatment desired.  AK (ACTINIC KERATOSIS) (11) B/L leg x 11 (11) Actinic keratoses are precancerous spots that appear secondary to cumulative UV radiation exposure/sun exposure over time. They are chronic with expected duration over 1 year. A portion of actinic keratoses will progress to squamous cell carcinoma of the skin. It is not possible to reliably predict which spots will progress to skin cancer and so treatment is recommended to prevent development of skin cancer.  Recommend daily broad spectrum sunscreen SPF 30+ to sun-exposed areas, reapply every 2 hours as needed.  Recommend staying in the shade or wearing long sleeves, sun glasses (UVA+UVB protection) and wide brim hats (4-inch brim around the entire circumference of the hat). Call for new or changing lesions.  Destruction of lesion - B/L leg x 11 (11) Complexity: simple   Destruction method: cryotherapy   Informed consent: discussed and consent obtained   Timeout:  patient name, date of birth, surgical site, and procedure verified Lesion destroyed using liquid nitrogen: Yes   Region frozen until ice ball extended beyond lesion: Yes   Outcome: patient tolerated procedure well with no complications   Post-procedure details: wound care instructions given   INFLAMED SEBORRHEIC KERATOSIS R forehead x 1 Symptomatic, irritating, patient would like treated.  Destruction of lesion - R forehead x 1 Complexity: simple   Destruction method: cryotherapy  Informed consent: discussed and consent obtained   Timeout:  patient name, date of birth, surgical site, and procedure verified Lesion destroyed using liquid nitrogen: Yes   Region frozen  until ice ball extended beyond lesion: Yes   Outcome: patient tolerated procedure well with no complications   Post-procedure details: wound care instructions given    HISTORY OF SQUAMOUS CELL CARCINOMA OF THE SKIN - No evidence of recurrence today - No lymphadenopathy - Recommend regular full body skin exams - Recommend daily broad spectrum sunscreen SPF 30+ to sun-exposed areas, reapply every 2 hours as needed.  - Call if any new or changing lesions are noted between office visits  Purpura - Chronic; persistent and recurrent.  Treatable, but not curable. - Violaceous macules and patches - Benign - Related to trauma, age, sun damage and/or use of blood thinners, chronic use of topical and/or oral steroids - Observe - Can use OTC arnica containing moisturizer such as Dermend Bruise Formula if desired - Call for worsening or other concerns  Return in about 8 months (around 04/27/2024) for TBSE - Hx SCC, AK.  I, Mara Seminole, CMA, am acting as scribe for Celine Collard, MD .   Documentation: I have reviewed the above documentation for accuracy and completeness, and I agree with the above.  Celine Collard, MD

## 2023-08-26 NOTE — Patient Instructions (Signed)

## 2023-09-20 ENCOUNTER — Other Ambulatory Visit: Payer: Self-pay | Admitting: Internal Medicine

## 2023-09-20 DIAGNOSIS — Z1231 Encounter for screening mammogram for malignant neoplasm of breast: Secondary | ICD-10-CM

## 2023-10-14 ENCOUNTER — Ambulatory Visit
Admission: RE | Admit: 2023-10-14 | Discharge: 2023-10-14 | Disposition: A | Discharge status: Home / Self Care | Source: Ambulatory Visit | Attending: Internal Medicine | Admitting: Internal Medicine

## 2023-10-14 DIAGNOSIS — Z1231 Encounter for screening mammogram for malignant neoplasm of breast: Secondary | ICD-10-CM | POA: Insufficient documentation

## 2023-12-30 ENCOUNTER — Ambulatory Visit
Admission: RE | Admit: 2023-12-30 | Discharge: 2023-12-30 | Disposition: A | Discharge status: Home / Self Care | Source: Ambulatory Visit | Attending: Internal Medicine | Admitting: Internal Medicine

## 2023-12-30 ENCOUNTER — Other Ambulatory Visit: Payer: Self-pay | Admitting: Internal Medicine

## 2023-12-30 DIAGNOSIS — R29810 Facial weakness: Secondary | ICD-10-CM

## 2023-12-30 MED ORDER — GADOBUTROL 1 MMOL/ML IV SOLN
8.0000 mL | Freq: Once | INTRAVENOUS | Status: AC | PRN
  Administered 2023-12-30: 8 mL via INTRAVENOUS

## 2023-12-30 NOTE — Progress Notes (Signed)
 ELYSIA Reese is a 78 y.o. female here for follow up of their medical problems  CHIEF COMPLAINT:  Follow up medical problems in the problem list and as discussed in the history and assessment areas as well as new complaints as listed.   Patient Active Problem List  Diagnosis  . A-fib (CMS/HHS-HCC)  . HTN (hypertension), benign  . Hyperlipidemia  . Osteopenia  . Major depression in remission (HCC)  . Health care maintenance  . Diabetes mellitus type 2, controlled, with complications (CMS/HHS-HCC)  . PVD (peripheral vascular disease)  . History of pulmonary embolism  . MCI (mild cognitive impairment)  . B12 deficiency  . Exertional dyspnea  . History of tobacco use  . Heart palpitations  . Peripheral edema     HISTORY OF PRESENT ILLNESS:  [place] sided [place] and possibly some confusion and ataxia. Has mostly cleared now and was able to get her meds at this beach that we sent in. [place] a week prior to that and hit her temple on the left and has some pain there.   Diabetes mellitus type 2, controlled, with complications (CMS-HCC) Doing well off meds  MCI (mild cognitive impairment) ? Early dementia, offered memory referral but she declined   Past Medical History:  Diagnosis Date  . A-fib (CMS/HHS-HCC) 08/17/2013  . Colorectal polyps   . Depression   . DM II (diabetes mellitus, type II), controlled (CMS/HHS-HCC) 08/17/2013  . History of DVT (deep vein thrombosis)    2 dvt's and pulm emboli also in 2015  . Hypercholesterolemia   . Hypertension   . Menopause   . Obesity   . Pulmonary embolism, bilateral (CMS/HHS-HCC)   . Tobacco abuse     Past Surgical History:  Procedure Laterality Date  . MASTECTOMY Left July, 2015   Partial mastectomy with sentinel lymph node biopsy  . excision skin cancer     several squamous cell carcinoma removed in the past  . Ganglion cyst removal Left    Axilla, 20 years ago     No fever chills  or sweats   No nausea, vomiting or diarrhea  No chest pain, shortness of breath   Social History   Socioeconomic History  . Marital status: Widowed  Tobacco Use  . Smoking status: Former    Current packs/day: 0.00    Types: Cigarettes    Quit date: 1996    Years since quitting: 29.7  . Smokeless tobacco: Never  Vaping Use  . Vaping status: Never Used  Substance and Sexual Activity  . Alcohol use: Yes    Alcohol/week: 2.0 standard drinks of alcohol    Types: 2 Cans of beer per week  . Drug use: No  . Sexual activity: Not Currently   Social Drivers of Health   Financial Resource Strain: Low Risk  (05/18/2023)   Overall Financial Resource Strain (CARDIA)   . Difficulty of Paying Living Expenses: Not hard at all  Food Insecurity: No Food Insecurity (05/18/2023)   Hunger Vital Sign   . Worried About Programme researcher, broadcasting/film/video in the Last Year: Never true   . Ran Out of Food in the Last Year: Never true  Transportation Needs: No Transportation Needs (05/18/2023)   PRAPARE - Transportation   . Lack of Transportation (Medical): No   . Lack of Transportation (Non-Medical): No  Housing Stability: Unknown (06/15/2023)   Housing Stability Vital Sign   . Unable to Pay for Housing in the  Last Year: No   . Homeless in the Last Year: No      Current Outpatient Medications:  .  albuterol  90 mcg/actuation inhaler, Inhale 2 inhalations into the lungs every 4 (four) hours as needed for Wheezing, Disp: 1 each, Rfl: 7 .  aluminum-magnesium  hydroxide-simethicone , DRAH, (MI-ACID) 200-200-20 mg/5 mL suspension, Take by mouth, Disp: , Rfl:  .  bumetanide (BUMEX) 1 MG tablet, Take 1 tablet (1 mg total) by mouth once daily, Disp: 7 tablet, Rfl: 3 .  busPIRone  (BUSPAR ) 10 MG tablet, Take 1 tablet (10 mg total) by mouth once daily, Disp: 7 tablet, Rfl: 3 .  citalopram  (CELEXA ) 20 MG tablet, Take 1 tablet (20 mg total) by mouth once daily, Disp: 7 tablet, Rfl: 3 .  cyanocobalamin (VITAMIN B12) 1000 MCG tablet,  Take 1 tablet by mouth once daily, Disp: , Rfl:  .  dilTIAZem  (CARDIZEM  CD) 240 MG CD capsule, Take 1 capsule (240 mg total) by mouth once daily, Disp: 7 capsule, Rfl: 3 .  LORazepam  (ATIVAN ) 1 MG tablet, Take 1 tablet (1 mg total) by mouth every 4 (four) hours as needed for Anxiety (Rarely as needed), Disp: 7 tablet, Rfl: 0 .  losartan (COZAAR) 100 MG tablet, Take 1 tablet (100 mg total) by mouth once daily, Disp: 7 tablet, Rfl: 3 .  mupirocin (BACTROBAN) 2 % ointment, Apply topically 2 (two) times daily, Disp: 22 g, Rfl: 0 .  pantoprazole  (PROTONIX ) 40 MG DR tablet, Take 1 tablet (40 mg total) by mouth once daily, Disp: 7 tablet, Rfl: 1 .  potassium chloride  (KLOR-CON ) 10 MEQ ER tablet, Take 2 tablets (20 mEq total) by mouth once daily, Disp: 7 tablet, Rfl: 11 .  pravastatin  (PRAVACHOL ) 40 MG tablet, Take 1 tablet (40 mg total) by mouth every evening, Disp: 7 tablet, Rfl: 1 .  vits A,C,E/lutein/minerals (EYE HEALTH PLUS LUTEIN ORAL), Take 1 tablet by mouth once daily, Disp: , Rfl:  .  warfarin (COUMADIN ) 1 MG tablet, Take 1 tablet (1 mg total) by mouth once daily, Disp: 30 tablet, Rfl: 5 .  warfarin (COUMADIN ) 1 MG tablet, TAKE 1/2 TABLET(0.5 MG) BY MOUTH DAILY, Disp: 45 tablet, Rfl: 1 .  warfarin (COUMADIN ) 2 MG tablet, Take 1 tablet (2 mg total) by mouth once daily, Disp: 20 tablet, Rfl: 0 .  warfarin (COUMADIN ) 5 MG tablet, TAKE 1 TABLET BY MOUTH EVERY DAY, Disp: 90 tablet, Rfl: 0  Vitals:   12/30/23 1353  BP: (!) 144/78  Pulse: 68   Body mass index is 33.78 kg/m. No acute distress Lungs; clear to ascultation Heart; Regular rate and rhythm  Abdomen; Soft and flat, normal bowel sounds Extremities; No clubbing, cyanosis or edema Neuro; Mild left 7th nerve weakness, seemingly nonfocal otherwise  Nurse Only on 12/14/2023  Component Date Value Ref Range Status  . POC Prothrombin Time WB 12/14/2023 35.1 (H)  9.5 - 13.1 sec Final  . POC Prothrombin INR WB 12/14/2023 2.9 (H)  0.8 - 1.1  Final  . INR - External 12/14/2023 2.9 (!)  0.9 - 1.1 Final  Appointment on 12/03/2023  Component Date Value Ref Range Status  . Glucose 12/03/2023 166 (H)  70 - 110 mg/dL Final  . Sodium 90/87/7974 138  136 - 145 mmol/L Final  . Potassium 12/03/2023 3.2 (L)  3.6 - 5.1 mmol/L Final  . Chloride 12/03/2023 99  97 - 109 mmol/L Final  . Carbon Dioxide (CO2) 12/03/2023 30.1  22.0 - 32.0 mmol/L Final  . Calcium 12/03/2023  9.1  8.7 - 10.3 mg/dL Final  . Urea Nitrogen (BUN) 12/03/2023 15  7 - 25 mg/dL Final  . Creatinine 90/87/7974 0.8  0.6 - 1.1 mg/dL Final  . Glomerular Filtration Rate (eGFR) 12/03/2023 76  >60 mL/min/1.73sq m Final  . BUN/Crea Ratio 12/03/2023 18.8  6.0 - 20.0 Final  . Anion Gap w/K 12/03/2023 12.1  6.0 - 16.0 Final  Nurse Only on 11/15/2023  Component Date Value Ref Range Status  . POC Prothrombin Time WB 11/15/2023 34.7 (H)  9.5 - 13.1 sec Final  . POC Prothrombin INR WB 11/15/2023 2.9 (H)  0.8 - 1.1 Final  . INR - External 11/15/2023 2.9 (!)  0.9 - 1.1 Final  Nurse Only on 10/13/2023  Component Date Value Ref Range Status  . POC Prothrombin Time WB 10/13/2023 25.6 (H)  9.5 - 13.1 sec Final  . POC Prothrombin INR WB 10/13/2023 2.1 (H)  0.8 - 1.1 Final  . INR - External 10/13/2023 2.1 (!)  0.9 - 1.1 Final    ASSESSMENT  AND PLAN:  Diagnoses and all orders for this visit:  Facial weakness -     MRI brain with and without contrast; Future -     CBC w/auto Differential (5 Part) -     Comprehensive Metabolic Panel (CMP) -     Prothrombin Time (INR)  Paroxysmal atrial fibrillation (CMS/HHS-HCC) -     CBC w/auto Differential (5 Part) -     Comprehensive Metabolic Panel (CMP) -     Prothrombin Time (INR)    Stat imaging to rule out bleed etc.

## 2024-01-10 NOTE — Progress Notes (Signed)
 Documentation for warfarin management Last 3 INR results:  Lab Results  Component Value Date   INR 2.4 12/30/2023   INR 2.9 (!) 12/14/2023   INR 2.9 (!) 11/15/2023    Dosage adjustment or any change in the treatment regimen today: No.  INR within goal range. Patient advised to continue current warfarin dosing regimen of 42.5mg  total weekly dose, 7.5mg  (5mg  x 1.5) Thurs,Sat and 5.5mg  (5mg  x 1,1mg  x 0.5) all other days.  Management of result: nurse driven protocol implemented  Patient/caregiver education provided during today's visit: dosing.  Patient/caregiver did verbalize understanding of the no change in dose and any education provided.   The patient/caregiver is instructed to return in one month for recheck.  Future Appointments     Date/Time Provider Department Center Visit Type   02/08/2024 10:00 AM [facility] NURSE POD C [facility] C NURSE VISIT   02/08/2024 10:00 AM [facility]ANTICOAG [facility] C ANTI-COAG   02/24/2024 10:00 AM [facility] CARD US  2 [facility] C ECHO COMPLETE   03/02/2024 10:30 AM Drane, Therisa Ip, PA [facility] C FOLLOW UP   05/15/2024 9:30 AM [facility] C LAB   05/22/2024 10:30 AM Lenon Layman Tanda DOUGLAS, MD [facility] C PHYSICAL

## 2024-01-18 ENCOUNTER — Emergency Department

## 2024-01-18 ENCOUNTER — Inpatient Hospital Stay
Admission: EM | Admit: 2024-01-18 | Discharge: 2024-01-20 | DRG: 683 | Disposition: A | Discharge status: Home / Self Care | Source: Other Acute Inpatient Hospital

## 2024-01-18 ENCOUNTER — Other Ambulatory Visit: Payer: Self-pay

## 2024-01-18 DIAGNOSIS — I110 Hypertensive heart disease with heart failure: Secondary | ICD-10-CM | POA: Diagnosis present

## 2024-01-18 DIAGNOSIS — R319 Hematuria, unspecified: Secondary | ICD-10-CM | POA: Diagnosis present

## 2024-01-18 DIAGNOSIS — J069 Acute upper respiratory infection, unspecified: Secondary | ICD-10-CM | POA: Diagnosis present

## 2024-01-18 DIAGNOSIS — E871 Hypo-osmolality and hyponatremia: Secondary | ICD-10-CM | POA: Diagnosis present

## 2024-01-18 DIAGNOSIS — Z86718 Personal history of other venous thrombosis and embolism: Secondary | ICD-10-CM

## 2024-01-18 DIAGNOSIS — Z8249 Family history of ischemic heart disease and other diseases of the circulatory system: Secondary | ICD-10-CM

## 2024-01-18 DIAGNOSIS — Z9221 Personal history of antineoplastic chemotherapy: Secondary | ICD-10-CM | POA: Diagnosis not present

## 2024-01-18 DIAGNOSIS — D6851 Activated protein C resistance: Secondary | ICD-10-CM | POA: Diagnosis present

## 2024-01-18 DIAGNOSIS — E8809 Other disorders of plasma-protein metabolism, not elsewhere classified: Secondary | ICD-10-CM | POA: Diagnosis present

## 2024-01-18 DIAGNOSIS — Z79899 Other long term (current) drug therapy: Secondary | ICD-10-CM

## 2024-01-18 DIAGNOSIS — Z66 Do not resuscitate: Secondary | ICD-10-CM | POA: Diagnosis present

## 2024-01-18 DIAGNOSIS — Z91148 Patient's other noncompliance with medication regimen for other reason: Secondary | ICD-10-CM

## 2024-01-18 DIAGNOSIS — Z87891 Personal history of nicotine dependence: Secondary | ICD-10-CM | POA: Diagnosis not present

## 2024-01-18 DIAGNOSIS — R410 Disorientation, unspecified: Secondary | ICD-10-CM | POA: Diagnosis present

## 2024-01-18 DIAGNOSIS — E119 Type 2 diabetes mellitus without complications: Secondary | ICD-10-CM | POA: Diagnosis present

## 2024-01-18 DIAGNOSIS — Z923 Personal history of irradiation: Secondary | ICD-10-CM | POA: Diagnosis not present

## 2024-01-18 DIAGNOSIS — I5032 Chronic diastolic (congestive) heart failure: Secondary | ICD-10-CM | POA: Diagnosis present

## 2024-01-18 DIAGNOSIS — F411 Generalized anxiety disorder: Secondary | ICD-10-CM | POA: Diagnosis present

## 2024-01-18 DIAGNOSIS — E7800 Pure hypercholesterolemia, unspecified: Secondary | ICD-10-CM | POA: Diagnosis present

## 2024-01-18 DIAGNOSIS — N179 Acute kidney failure, unspecified: Secondary | ICD-10-CM | POA: Diagnosis present

## 2024-01-18 DIAGNOSIS — F329 Major depressive disorder, single episode, unspecified: Secondary | ICD-10-CM | POA: Diagnosis present

## 2024-01-18 DIAGNOSIS — I4891 Unspecified atrial fibrillation: Secondary | ICD-10-CM | POA: Diagnosis present

## 2024-01-18 DIAGNOSIS — Z85828 Personal history of other malignant neoplasm of skin: Secondary | ICD-10-CM

## 2024-01-18 DIAGNOSIS — Z853 Personal history of malignant neoplasm of breast: Secondary | ICD-10-CM | POA: Diagnosis not present

## 2024-01-18 DIAGNOSIS — K219 Gastro-esophageal reflux disease without esophagitis: Secondary | ICD-10-CM | POA: Diagnosis present

## 2024-01-18 DIAGNOSIS — Z7901 Long term (current) use of anticoagulants: Secondary | ICD-10-CM | POA: Diagnosis not present

## 2024-01-18 DIAGNOSIS — Z1152 Encounter for screening for COVID-19: Secondary | ICD-10-CM | POA: Diagnosis not present

## 2024-01-18 DIAGNOSIS — E876 Hypokalemia: Secondary | ICD-10-CM | POA: Diagnosis present

## 2024-01-18 DIAGNOSIS — Z83438 Family history of other disorder of lipoprotein metabolism and other lipidemia: Secondary | ICD-10-CM

## 2024-01-18 DIAGNOSIS — Z888 Allergy status to other drugs, medicaments and biological substances status: Secondary | ICD-10-CM

## 2024-01-18 DIAGNOSIS — E785 Hyperlipidemia, unspecified: Secondary | ICD-10-CM | POA: Diagnosis present

## 2024-01-18 DIAGNOSIS — R5381 Other malaise: Secondary | ICD-10-CM | POA: Diagnosis present

## 2024-01-18 DIAGNOSIS — E118 Type 2 diabetes mellitus with unspecified complications: Secondary | ICD-10-CM | POA: Diagnosis present

## 2024-01-18 DIAGNOSIS — Z9841 Cataract extraction status, right eye: Secondary | ICD-10-CM

## 2024-01-18 DIAGNOSIS — R1031 Right lower quadrant pain: Secondary | ICD-10-CM

## 2024-01-18 DIAGNOSIS — Z961 Presence of intraocular lens: Secondary | ICD-10-CM | POA: Diagnosis present

## 2024-01-18 DIAGNOSIS — Z86711 Personal history of pulmonary embolism: Secondary | ICD-10-CM

## 2024-01-18 DIAGNOSIS — I10 Essential (primary) hypertension: Secondary | ICD-10-CM | POA: Diagnosis present

## 2024-01-18 DIAGNOSIS — Z9842 Cataract extraction status, left eye: Secondary | ICD-10-CM

## 2024-01-18 LAB — COMPREHENSIVE METABOLIC PANEL WITH GFR
ALT: 26 U/L (ref 0–44)
AST: 25 U/L (ref 15–41)
Albumin: 3 g/dL — ABNORMAL LOW (ref 3.5–5.0)
Alkaline Phosphatase: 56 U/L (ref 38–126)
Anion gap: 13 (ref 5–15)
BUN: 29 mg/dL — ABNORMAL HIGH (ref 8–23)
CO2: 20 mmol/L — ABNORMAL LOW (ref 22–32)
Calcium: 8.3 mg/dL — ABNORMAL LOW (ref 8.9–10.3)
Chloride: 101 mmol/L (ref 98–111)
Creatinine, Ser: 2.15 mg/dL — ABNORMAL HIGH (ref 0.44–1.00)
GFR, Estimated: 23 mL/min — ABNORMAL LOW (ref >60)
Glucose, Bld: 152 mg/dL — ABNORMAL HIGH (ref 70–99)
Potassium: 3.4 mmol/L — ABNORMAL LOW (ref 3.5–5.1)
Sodium: 134 mmol/L — ABNORMAL LOW (ref 135–145)
Total Bilirubin: 0.9 mg/dL (ref 0.0–1.2)
Total Protein: 7 g/dL (ref 6.5–8.1)

## 2024-01-18 LAB — CBC
HCT: 39.8 % (ref 36.0–46.0)
Hemoglobin: 13.2 g/dL (ref 12.0–15.0)
MCH: 27.2 pg (ref 26.0–34.0)
MCHC: 33.2 g/dL (ref 30.0–36.0)
MCV: 81.9 fL (ref 80.0–100.0)
Platelets: 207 K/uL (ref 150–400)
RBC: 4.86 MIL/uL (ref 3.87–5.11)
RDW: 14.6 % (ref 11.5–15.5)
WBC: 7.3 K/uL (ref 4.0–10.5)
nRBC: 0 % (ref 0.0–0.2)

## 2024-01-18 LAB — URINALYSIS, ROUTINE W REFLEX MICROSCOPIC
Bilirubin Urine: NEGATIVE
Glucose, UA: NEGATIVE mg/dL
Hgb urine dipstick: NEGATIVE
Ketones, ur: NEGATIVE mg/dL
Leukocytes,Ua: NEGATIVE
Nitrite: NEGATIVE
Protein, ur: NEGATIVE mg/dL
Specific Gravity, Urine: 1.003 — ABNORMAL LOW (ref 1.005–1.030)
pH: 5 (ref 5.0–8.0)

## 2024-01-18 LAB — PROTIME-INR
INR: 1.5 — ABNORMAL HIGH (ref 0.8–1.2)
Prothrombin Time: 18.6 s — ABNORMAL HIGH (ref 11.4–15.2)

## 2024-01-18 LAB — TSH: TSH: 4.932 u[IU]/mL — ABNORMAL HIGH (ref 0.350–4.500)

## 2024-01-18 LAB — RESP PANEL BY RT-PCR (RSV, FLU A&B, COVID)  RVPGX2
Influenza A by PCR: NEGATIVE
Influenza B by PCR: NEGATIVE
Resp Syncytial Virus by PCR: NEGATIVE
SARS Coronavirus 2 by RT PCR: NEGATIVE

## 2024-01-18 LAB — BRAIN NATRIURETIC PEPTIDE: B Natriuretic Peptide: 202.7 pg/mL — ABNORMAL HIGH (ref 0.0–100.0)

## 2024-01-18 LAB — MAGNESIUM: Magnesium: 1.8 mg/dL (ref 1.7–2.4)

## 2024-01-18 LAB — TROPONIN I (HIGH SENSITIVITY): Troponin I (High Sensitivity): 6 ng/L (ref <18)

## 2024-01-18 MED ORDER — ACETAMINOPHEN 325 MG PO TABS: 650.0000 mg | ORAL_TABLET | Freq: Four times a day (QID) | ORAL | Status: DC | PRN

## 2024-01-18 MED ORDER — ACETAMINOPHEN 650 MG RE SUPP: 650.0000 mg | Freq: Four times a day (QID) | RECTAL | Status: DC | PRN

## 2024-01-18 MED ORDER — SENNOSIDES-DOCUSATE SODIUM 8.6-50 MG PO TABS: 1.0000 | ORAL_TABLET | Freq: Every evening | ORAL | Status: DC | PRN

## 2024-01-18 MED ORDER — WARFARIN - PHARMACIST DOSING INPATIENT
Freq: Every day | Status: DC
  Filled 2024-01-18: qty 1

## 2024-01-18 MED ORDER — SODIUM CHLORIDE 0.9 % IV BOLUS
500.0000 mL | Freq: Once | INTRAVENOUS | Status: AC
  Administered 2024-01-18: 500 mL via INTRAVENOUS

## 2024-01-18 MED ORDER — SODIUM CHLORIDE 0.9% FLUSH
3.0000 mL | Freq: Two times a day (BID) | INTRAVENOUS | Status: DC
  Administered 2024-01-18 – 2024-01-20 (×4): 3 mL via INTRAVENOUS

## 2024-01-18 MED ORDER — WARFARIN SODIUM 7.5 MG PO TABS
7.5000 mg | ORAL_TABLET | Freq: Once | ORAL | Status: AC
  Administered 2024-01-18: 7.5 mg via ORAL
  Filled 2024-01-18: qty 1

## 2024-01-18 NOTE — ED Notes (Signed)
 Post void 0ml. UO 

## 2024-01-18 NOTE — Plan of Care (Signed)

## 2024-01-18 NOTE — Consult Note (Signed)
 PHARMACY - ANTICOAGULATION CONSULT NOTE  Pharmacy Consult for Warfarin Indication: atrial fibrillation and Hx of VTE  Allergies  Allergen Reactions   Metoprolol  Shortness Of Breath   Other Other (See Comments)    Sodium pentothal  Causes blood clots    Patient Measurements: Height: 5' 5 (165.1 cm) Weight: 90.3 kg (199 lb) IBW/kg (Calculated) : 57 HEPARIN DW (KG): 77  Vital Signs: Temp: 97.9 F (36.6 C) (10/28 0923) Temp Source: Oral (10/28 0923) BP: 121/73 (10/28 0923) Pulse Rate: 70 (10/28 1000)  Labs: Recent Labs    01/18/24 0933  HGB 13.2  HCT 39.8  PLT 207  LABPROT 18.6*  INR 1.5*  CREATININE 2.15*  TROPONINIHS 6    Estimated Creatinine Clearance: 24.7 mL/min (A) (by C-G formula based on SCr of 2.15 mg/dL (H)).   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 08/22/2007   L pretibial inf   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 01/30/2010   R inf pretibial - ED&C    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 04/14/2016   L mid med pretibial    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 05/20/2016   L mid to distal med pretibial     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 07/20/2016   L mid med pretibial    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 05/10/2017   L mid pretibial    Personal history of chemotherapy    1 round   Personal history of radiation therapy    Skin cancer    squamous cell   Squamous cell carcinoma of skin 03/24/2006   L sup pretibial    Squamous cell carcinoma of skin 03/24/2006   R pretibial inf    Squamous cell carcinoma of skin 05/25/2006   L lat mid lower leg    Squamous cell carcinoma of skin 06/29/2006   R pretibial lat    Squamous cell carcinoma of skin 10/17/2012   R prox pretibial ED&C   Squamous cell carcinoma of skin 12/19/2012   R prox sup pretibial    Squamous cell carcinoma of skin 02/03/2016   L med mid pretibial    Squamous cell carcinoma of skin 08/20/2016   L pretibial sup    Squamous cell carcinoma of skin 08/20/2016   L pretibial inf    Squamous cell carcinoma of skin 10/10/2018   L prox med pretibial    Squamous cell carcinoma of skin 06/29/2019   L calf    Wears dentures    full upper and lower    Medications:  (Not in a hospital admission)  Scheduled:   sodium chloride  flush  3 mL Intravenous Q12H   Infusions:  PRN: acetaminophen  **OR** acetaminophen , senna-docusate Anti-infectives (From admission, onward)    None       Assessment: 78 y.o. year old female with past medical history of hypertension, hyperlipidemia, A-fib on Coumadin , history of PE and DVT presenting to the ED from [facility] clinic due to malaise, fatigue and confusion and admitted for an AKI. Per RN note, concern for hematuria and discussion with MD ok to restart warfarin at this time. CHADSVASc of 6 and INR is subtherapeutic. Spoke with patient and she takes warfarin 7.5 mg on Thursdays and Saturdays and 5.5 mg all other days. Pt admits to missing a few doses this week.   Date INR Warfarin Dose  10/28 1.5 7.5 mg          Goal of Therapy:  INR  2-3 Monitor platelets by anticoagulation protocol: Yes   Plan:  INR is subtherapeutic. Will give warfarin 7.5 mg x 1 (increase from home dose) to help get the INR therapeutic quicker. Daily INR. CBC at least every 3 days. F/u with provider to see if pt will need to bridge due to high CHADSVASc score and if hemoglobin remains stable.   Cathaleen GORMAN Blanch, PharmD, BCPS 01/18/2024,1:12 PM

## 2024-01-18 NOTE — ED Notes (Signed)
 Pt transported to CT ?

## 2024-01-18 NOTE — ED Notes (Signed)
 Pt here from [facility]. Pt has been having hematuria, lower abd pain and lower back pain x1 week. Pt has also been having AMS x1 week. States that she has been forgetful having intermittent moments of not knowing who she is or where she is. Has also had congestion, SHOB, fatigue, and body aches.  VS from [facility] clinic include:  132/80 HR 100  97% RA  97.7

## 2024-01-18 NOTE — ED Triage Notes (Signed)
 Pt here from [facility]. Pt has been having hematuria, lower abd pain and lower back pain x1 week. Pt has also been having AMS x1 week. States that she has been forgetful having intermittent moments of not knowing who she is or where she is. Has also had congestion, SHOB, fatigue, and body aches.   VS from [facility] clinic include:  132/80 HR 100  97% RA  97.7  Pt A&Ox4.

## 2024-01-18 NOTE — H&P (Signed)
 History and Physical    Stacy Reese FMW:[id number] DOB: 1945/09/22 DOA: 01/18/2024  DOS: the patient was seen and examined on 01/18/2024  PCP: Lenon Layman ORN, MD   Patient coming from: Home  I have personally briefly reviewed patient's old medical records in [facility] and CareEverywhere  HPI:   Stacy Reese is a 78 y.o. year old female with past medical history of hypertension, hyperlipidemia, A-fib on Coumadin , history of PE and DVT presenting to the ED from [place] clinic due to malaise, fatigue and confusion and admitted for an AKI.   Pt states he has been feeling unwell since last week with symptoms of congestion, myalgias, and diarrhea.  Per family did not want to visit her last week because they thought she had COVID.  She has not been taking her medications because she has been feeling unwell.  She went to the urgent care today to get checked but they referred her to the ED.  Patient states she feels better currently than she did when she arrived to the ED.   On arrival to the ED patient was noted to be HDS stable.  Lab work and imaging were obtained that showed CBC without leukocytosis, normal hemoglobin.  CMP with mild hyponatremia, hypokalemia and significant AKI at 2.15 and hypoalbuminemia.  BMP checked and was slightly elevated at 202 which appears to be patient's baseline.  Troponin checked and negative.  Respiratory panel negative for flu, RSV, COVID.  CT head done and negative for any acute process as well as CT abdomen pelvis not showing any acute process given patient's complaint of back pain and abdominal pain.  Urinalysis obtained and in process currently.  Given need for continued care and her AKI,TRH contacted for admission.  Review of Systems: As mentioned in the history of present illness. All other systems reviewed and are negative.   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 08/22/2007   L pretibial inf   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 01/30/2010   R inf pretibial - ED&C    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 04/14/2016   L mid med pretibial    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 05/20/2016   L mid to distal med pretibial    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 07/20/2016   L mid med pretibial    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 05/10/2017   L mid pretibial    Personal history of chemotherapy    1 round   Personal history of radiation therapy    Skin cancer    squamous cell  Squamous cell carcinoma of skin 03/24/2006   L sup pretibial    Squamous cell carcinoma of skin 03/24/2006   R pretibial inf    Squamous cell carcinoma of skin 05/25/2006   L lat mid lower leg    Squamous cell carcinoma of skin 06/29/2006   R pretibial lat    Squamous cell carcinoma of skin 10/17/2012   R prox pretibial ED&C   Squamous cell carcinoma of skin 12/19/2012   R prox sup pretibial    Squamous cell carcinoma of skin 02/03/2016   L med mid pretibial    Squamous cell carcinoma of skin 08/20/2016   L pretibial sup    Squamous cell carcinoma of skin 08/20/2016   L pretibial inf     Squamous cell carcinoma of skin 10/10/2018   L prox med pretibial    Squamous cell carcinoma of skin 06/29/2019   L calf    Wears dentures    full upper and lower    Past Surgical History:  Procedure Laterality Date   BREAST BIOPSY Left 09/04/2013   negative stereo bx   BREAST BIOPSY Left 08/25/2013   postive us  core bx   BREAST LUMPECTOMY Left 08/2017   BREAST LUMPECTOMY W/ NEEDLE LOCALIZATION Left 2015   CATARACT EXTRACTION W/PHACO Left 05/19/2023   Procedure: CATARACT EXTRACTION PHACO AND INTRAOCULAR LENS PLACEMENT (IOC) LEFT 9.15 00:53.4;  Surgeon: Enola Feliciano Hugger, MD;  Location: [facility];  Service: Ophthalmology;  Laterality: Left;   CATARACT EXTRACTION W/PHACO Right 06/03/2023   Procedure: CATARACT EXTRACTION PHACO AND INTRAOCULAR LENS PLACEMENT (IOC) RIGHT  5.08, 00:35.3;  Surgeon: Enola Feliciano Hugger, MD;  Location: [facility];  Service: Ophthalmology;  Laterality: Right;   ESOPHAGOGASTRODUODENOSCOPY (EGD) WITH PROPOFOL  N/A 08/05/2018   Procedure: ESOPHAGOGASTRODUODENOSCOPY (EGD) WITH PROPOFOL ;  Surgeon: Jinny Carmine, MD;  Location: [facility];  Service: Endoscopy;  Laterality: N/A;   GANGLION CYST EXCISION     OPEN REDUCTION INTERNAL FIXATION (ORIF) DISTAL RADIAL FRACTURE Left 07/11/2019   Procedure: OPEN REDUCTION INTERNAL FIXATION (ORIF) DISTAL RADIAL FRACTURE;  Surgeon: Kathlynn Sharper, MD;  Location: [facility];  Service: Orthopedics;  Laterality: Left;     Allergies  Allergen Reactions   Metoprolol  Shortness Of Breath   Other Other (See Comments)    Sodium pentothal  Causes blood clots    Family History  Problem Relation Age of Onset   Cancer Father        colon   CAD Father    Hypertension Father    Hyperlipidemia Father    CAD Mother    Breast cancer Neg Hx     Prior to Admission medications   Medication Sig Start Date End Date Taking? Authorizing Provider  albuterol  (VENTOLIN  HFA) 108 (90 Base) MCG/ACT inhaler Inhale into the  lungs every 6 (six) hours as needed for wheezing or shortness of breath.    [provider]  busPIRone  (BUSPAR ) 10 MG tablet Take 10 mg by mouth daily.     [provider]  citalopram  (CELEXA ) 20 MG tablet Take 20 mg by mouth daily.     [provider]  cyanocobalamin (VITAMIN B12) 1000 MCG tablet Take 1,000 mcg by mouth daily.    [provider]  diltiazem  (TIAZAC ) 240 MG 24 hr capsule Take 240 mg by mouth daily.  07/13/14   [provider]  furosemide  (LASIX ) 40 MG tablet Take 40 mg by mouth daily as needed.    [provider]  LORazepam  (ATIVAN ) 1 MG  tablet Take 1 mg by mouth daily as needed for anxiety.    [provider]  losartan (COZAAR) 100 MG tablet Take 100 mg by mouth daily. 05/18/23   [provider]  Multiple Vitamins-Minerals (VISION FORMULA PO) Take by mouth daily.    [provider]  pantoprazole  (PROTONIX ) 40 MG tablet Take 1 tablet (40 mg total) by mouth daily. 08/08/18 06/03/23  Tobie Press, MD  pravastatin  (PRAVACHOL ) 40 MG tablet Take 40 mg by mouth daily.     [provider]  tacrolimus  (PROTOPIC ) 0.1 % ointment Apply topically 2 (two) times daily. To lower legs as needed. 01/14/23   Hester Alm BROCKS, MD  warfarin (COUMADIN ) 1 MG tablet Take 1 mg by mouth daily. This week 1 pill daily for 7 days    [provider]  warfarin (COUMADIN ) 5 MG tablet Take 5 mg by mouth See admin instructions. Takes along with a 2mg  to equal 7mg  daily    [provider]    Social History:  reports that she quit smoking about 25 years ago. Her smoking use included cigarettes. She started smoking about 60 years ago. She has a 35 pack-year smoking history. She has never used smokeless tobacco. She reports that she does not currently use alcohol. She reports that she does not use drugs. Lives by herself Tobacco- Denies use. 2 ppd hx of 20 years. Quit 40 years ago. EtOH- 4 light beer  daily. Illicit drug use- denies use.  IADLs/ADLs- can perform independently at baseline    Physical Exam: Vitals:   01/18/24 0923 01/18/24 1000 01/18/24 1320 01/18/24 1359  BP: 121/73   (!) 151/89  Pulse: 79 70 70 79  Resp: 18 18 20 16   Temp: 97.9 F (36.6 C)   97.6 F (36.4 C)  TempSrc: Oral     SpO2: 100% 100% 99% 100%  Weight:      Height:       Gen: NAD HENT: NCAT CV: Irregularly irregular rhythm, good pulses Resp: CTAB, no rales, or wheeze Abd: No TTP to deep palpation, normal bowel sounds MSK: No asymmetry Skin: No lesions noted on examined skin Neuro: Alert and oriented x 4 Psych: Normal mood   Labs on Admission: I have personally reviewed following labs and imaging studies  CBC: Recent Labs  Lab 01/18/24 0933  WBC 7.3  HGB 13.2  HCT 39.8  MCV 81.9  PLT 207   Basic Metabolic Panel: Recent Labs  Lab 01/18/24 0933  NA 134*  K 3.4*  CL 101  CO2 20*  GLUCOSE 152*  BUN 29*  CREATININE 2.15*  CALCIUM 8.3*  MG 1.8   GFR: Estimated Creatinine Clearance: 24.7 mL/min (A) (by C-G formula based on SCr of 2.15 mg/dL (H)). Liver Function Tests: Recent Labs  Lab 01/18/24 0933  AST 25  ALT 26  ALKPHOS 56  BILITOT 0.9  PROT 7.0  ALBUMIN 3.0*   No results for input(s): LIPASE, AMYLASE in the last 168 hours. No results for input(s): AMMONIA in the last 168 hours. Coagulation Profile: Recent Labs  Lab 01/18/24 0933  INR 1.5*   Cardiac Enzymes: Recent Labs  Lab 01/18/24 0933  TROPONINIHS 6   BNP (last 3 results) Recent Labs    07/19/23 1031 01/18/24 0932  BNP 211.9* 202.7*   HbA1C: No results for input(s): HGBA1C in the last 72 hours. CBG: No results for input(s): GLUCAP in the last 168 hours. Lipid Profile: No results for input(s): CHOL, HDL, LDLCALC, TRIG, CHOLHDL,  LDLDIRECT in the last 72 hours. Thyroid Function Tests: Recent Labs    01/18/24 0933  TSH 4.932*   Anemia Panel: No results for input(s):  VITAMINB12, FOLATE, FERRITIN, TIBC, IRON, RETICCTPCT in the last 72 hours. Urine analysis:    Component Value Date/Time   COLORURINE STRAW (A) 01/18/2024 0932   APPEARANCEUR CLEAR (A) 01/18/2024 0932   LABSPEC 1.003 (L) 01/18/2024 0932   PHURINE 5.0 01/18/2024 0932   GLUCOSEU NEGATIVE 01/18/2024 0932   HGBUR NEGATIVE 01/18/2024 0932   BILIRUBINUR NEGATIVE 01/18/2024 0932   KETONESUR NEGATIVE 01/18/2024 0932   PROTEINUR NEGATIVE 01/18/2024 0932   NITRITE NEGATIVE 01/18/2024 0932   LEUKOCYTESUR NEGATIVE 01/18/2024 0932    Radiological Exams on Admission: I have personally reviewed images CT Head Wo Contrast Result Date: 01/18/2024 CLINICAL DATA:  Intermittent episodes of altered mental status. EXAM: CT HEAD WITHOUT CONTRAST TECHNIQUE: Contiguous axial images were obtained from the base of the skull through the vertex without intravenous contrast. RADIATION DOSE REDUCTION: This exam was performed according to the departmental dose-optimization program which includes automated exposure control, adjustment of the mA and/or kV according to patient size and/or use of iterative reconstruction technique. COMPARISON:  None Available. FINDINGS: Brain: There is mild cerebral atrophy with widening of the extra-axial spaces and ventricular dilatation. There are areas of decreased attenuation within the white matter tracts of the supratentorial brain, consistent with microvascular disease changes. Vascular: Moderate severity bilateral cavernous carotid artery calcification is noted. Skull: Normal. Negative for fracture or focal lesion. Sinuses/Orbits: No acute finding. Other: None. IMPRESSION: 1. No acute intracranial abnormality. 2. Generalized cerebral atrophy with widening of the extra-axial spaces and ventricular dilatation. 3. Moderate severity bilateral cavernous carotid artery calcification. Electronically Signed   By: Suzen Dials M.D.   On: 01/18/2024 11:36   CT ABDOMEN PELVIS WO  CONTRAST Result Date: 01/18/2024 CLINICAL DATA:  Lower back and abdominal pain for the past week. Hematuria. EXAM: CT ABDOMEN AND PELVIS WITHOUT CONTRAST TECHNIQUE: Multidetector CT imaging of the abdomen and pelvis was performed following the standard protocol without IV contrast. RADIATION DOSE REDUCTION: This exam was performed according to the departmental dose-optimization program which includes automated exposure control, adjustment of the mA and/or kV according to patient size and/or use of iterative reconstruction technique. COMPARISON:  08/04/2018 FINDINGS: Lower chest: Mildly enlarged heart. Atheromatous calcifications, including the coronary arteries and aorta. Mild bibasilar atelectasis/scarring. Hepatobiliary: Mildly lobulated liver contours with hypertrophy of the lateral segment of the left lobe and caudate lobe of the liver. The right lobe is relatively small. Poorly distended, normal-appearing gallbladder. Pancreas: Unremarkable. No pancreatic ductal dilatation or surrounding inflammatory changes. Spleen: Borderline enlarged, measuring 12.7 cm in length. Adrenals/Urinary Tract: Adrenal glands are unremarkable. Kidneys are normal, without renal calculi, focal lesion, or hydronephrosis. Bladder is unremarkable. Stomach/Bowel: Extensive sigmoid and descending colon diverticulosis without evidence of diverticulitis. Unremarkable stomach, small bowel and appendix. Vascular/Lymphatic: Atheromatous arterial calcifications without aneurysm. No enlarged lymph nodes. Reproductive: Uterus and bilateral adnexa are unremarkable. Other: Stable minimal umbilical hernia containing fat. No abdominopelvic ascites. Musculoskeletal: Lumbar and lower thoracic spine degenerative changes. IMPRESSION: 1. No acute abnormality. 2. Extensive sigmoid and descending colon diverticulosis without evidence of diverticulitis. 3. Mild changes of cirrhosis with borderline splenomegaly. 4. Calcific coronary artery and aortic  atherosclerosis. Aortic Atherosclerosis (ICD10-I70.0). Electronically Signed   By: Elspeth Bathe M.D.   On: 01/18/2024 11:22   DG Chest 1 View Result Date: 01/18/2024 EXAM: 1 VIEW(S) XRAY OF THE CHEST 01/18/2024 09:55:00 AM COMPARISON: AP radiographs  of the chest dated 05/03/2021. CLINICAL HISTORY: sob. Pt here from [facility]. Pt has been having hematuria, lower abd pain and lower back pain x1 week. Pt has also been having AMS x1 week. States that she has been forgetful having intermittent moments of not knowing who she is or where she is. Has also had ; congestion, SHOB, fatigue, and body aches. FINDINGS: LUNGS AND PLEURA: Mild diffuse interstitial prominence. No focal pulmonary opacity. No pulmonary edema. No pleural effusion. No pneumothorax. HEART AND MEDIASTINUM: Mild cardiomegaly. Atherosclerotic calcification of the aortic arch. BONES AND SOFT TISSUES: No acute osseous abnormality. IMPRESSION: 1. Mild diffuse interstitial prominence. 2. Mild cardiomegaly and atherosclerotic calcification of the aortic arch. Electronically signed by: Evalene Coho MD 01/18/2024 10:13 AM EDT RP Workstation: [id number]    EKG: My personal interpretation of EKG shows: Atrial fibrillation with heart rate in the 70s, without any acute ST changes.  Assessment/Plan Principal Problem:   Acute kidney injury Active Problems:   Heterozygous factor V Leiden mutation   Hyperlipidemia   A-fib (HCC)   Diabetes mellitus type 2, controlled, with complications (HCC)   HTN (hypertension), benign   Patient presented with symptoms consistent with URI and found to have AKI.  Patient is status post 1 L continue IV fluids for her.  On exam, patient does not appear overtly dehydrated but will benefit from IVF given her AKI.  No signs suggest heart failure and last echo reviewed showed normal EF.  Will monitor renal status tomorrow a.m., renally dose meds, and avoid nephrotoxic medications.  It appears her URI was not resolving and given  its duration and her symptom improvement with time.  Hematuria: Noted by patient over the last week.  UA checked and without hemoglobinuria and hemoglobin at baseline.  No signs of UTI.  We can continue her Coumadin  and monitor for further episodes.  Given that she is reporting this, she may warrant further workup outpatient.  Hypokalemia: Will replete to goal of 4 and replete magnesium  to goal of 2.  Hyponatremia: Mild.  Will continue to trend.  A-fib: Appears rate controlled.  Will continue her anticoagulation as benefits outweigh the risks.  Continue Cardizem .  Hypertension: Holding ARB given AKI will continue Cardizem  and can use as needed IV meds.  Holding home diuretics.  MDD/GAD: Continue home meds  GERD: Continue home med.  VTE prophylaxis:  Coumadin   Diet: Code Status:  DNR/DNI(Do NOT Intubate) Telemetry:  Admission status: Inpatient, telemetry Patient is from: Home Anticipated d/c is to: Home Anticipated d/c is in: 2-3 days   Family Communication: Updated at bedside  Consults called: None   Severity of Illness: The appropriate patient status for this patient is INPATIENT. Inpatient status is judged to be reasonable and necessary in order to provide the required intensity of service to ensure the patient's safety. The patient's presenting symptoms, physical exam findings, and initial radiographic and laboratory data in the context of their chronic comorbidities is felt to place them at high risk for further clinical deterioration. Furthermore, it is not anticipated that the patient will be medically stable for discharge from the hospital within 2 midnights of admission.   * I certify that at the point of admission it is my clinical judgment that the patient will require inpatient hospital care spanning beyond 2 midnights from the point of admission due to high intensity of service, high risk for further deterioration and high frequency of surveillance required.DEWAINE Morene Bathe, MD [place] DEL. [facility]

## 2024-01-18 NOTE — ED Provider Notes (Signed)
 Stacy Reese Provider Note    Event Date/Time   First MD Initiated Contact with Patient 01/18/24 [phone]     (approximate)   History   Altered Mental Status   HPI  Stacy Reese is a 78 y.o. female with history of A-fib on Coumadin , PE/DVT, hyperlipidemia, here for abdominal pain, lower back pain and hematuria for a week.  Also states that she has been more forgetful than usual.  States that several days ago, because she was not feeling well, after getting up and feeding her dog, she went back to sleep, when she woke up felt slightly disoriented and did not know what time it was.  States that she did fall 3 weeks ago, unclear if there was any head strike, states that she did see a primary care doctor and had a CT head that was negative.  Had initially told primary care of some dyspnea on exertion but she denies any chest pain or shortness of breath with me.  Does note fatigue, body aches and congestion.  No chest pain, no dysuria.  On independent chart review, she was seen by primary care today, has been complaining for 1 week of hematuria, congestion, dyspnea on exertion, feeling feverish, no chest pain, headache, ear pain, was sent here for further evaluation.     Physical Exam   Triage Vital Signs: ED Triage Vitals  Encounter Vitals Group     BP 01/18/24 0923 121/73     Girls Systolic BP Percentile --      Girls Diastolic BP Percentile --      Boys Systolic BP Percentile --      Boys Diastolic BP Percentile --      Pulse Rate 01/18/24 0923 79     Resp 01/18/24 0923 18     Temp 01/18/24 0923 97.9 F (36.6 C)     Temp Source 01/18/24 0923 Oral     SpO2 01/18/24 0923 100 %     Weight 01/18/24 0920 199 lb (90.3 kg)     Height 01/18/24 0920 5' 5 (1.651 m)     Head Circumference --      Peak Flow --      Pain Score 01/18/24 0924 5     Pain Loc --      Pain Education --      Exclude from Growth Chart --     Most recent vital signs: Vitals:    01/18/24 1320 01/18/24 1359  BP:  (!) 151/89  Pulse: 70 79  Resp: 20 16  Temp:  97.6 F (36.4 C)  SpO2: 99% 100%     General: Awake, no distress.  CV:  Good peripheral perfusion.  Resp:  Normal effort.  No tachypnea or respiratory distress Abd:  No distention.  Soft mildly tender to the right lower quadrant Other:  No unilateral calf swelling or tenderness, pupils are equal, no nuchal rigidity, no focal weakness or numbness, no cranial nerve deficits.   ED Results / Procedures / Treatments   Labs (all labs ordered are listed, but only abnormal results are displayed) Labs Reviewed  COMPREHENSIVE METABOLIC PANEL WITH GFR - Abnormal; Notable for the following components:      Result Value   Sodium 134 (*)    Potassium 3.4 (*)    CO2 20 (*)    Glucose, Bld 152 (*)    BUN 29 (*)    Creatinine, Ser 2.15 (*)    Calcium 8.3 (*)  Albumin 3.0 (*)    GFR, Estimated 23 (*)    All other components within normal limits  URINALYSIS, ROUTINE W REFLEX MICROSCOPIC - Abnormal; Notable for the following components:   Color, Urine STRAW (*)    APPearance CLEAR (*)    Specific Gravity, Urine 1.003 (*)    All other components within normal limits  PROTIME-INR - Abnormal; Notable for the following components:   Prothrombin Time 18.6 (*)    INR 1.5 (*)    All other components within normal limits  BRAIN NATRIURETIC PEPTIDE - Abnormal; Notable for the following components:   B Natriuretic Peptide 202.7 (*)    All other components within normal limits  TSH - Abnormal; Notable for the following components:   TSH 4.932 (*)    All other components within normal limits  RESP PANEL BY RT-PCR (RSV, FLU A&B, COVID)  RVPGX2  CBC  MAGNESIUM   TROPONIN I (HIGH SENSITIVITY)     EKG  EKG shows, atrial fibrillation, rate 71, normal QRS, T wave flattening in 3, no obvious ischemic ST elevation, T wave changes new compared to prior   RADIOLOGY On my independent interpretation, CT without obvious  hydronephrosis   PROCEDURES:  Critical Care performed: No  Procedures   MEDICATIONS ORDERED IN ED: Medications  sodium chloride  flush (NS) 0.9 % injection 3 mL (3 mLs Intravenous Not Given 01/18/24 1237)  acetaminophen  (TYLENOL ) tablet 650 mg (has no administration in time range)    Or  acetaminophen  (TYLENOL ) suppository 650 mg (has no administration in time range)  senna-docusate (Senokot-S) tablet 1 tablet (has no administration in time range)  warfarin (COUMADIN ) tablet 7.5 mg (has no administration in time range)  Warfarin - Pharmacist Dosing Inpatient (has no administration in time range)  sodium chloride  0.9 % bolus 500 mL (0 mLs Intravenous Stopped 01/18/24 1414)     IMPRESSION / MDM / ASSESSMENT AND PLAN / ED COURSE  I reviewed the triage vital signs and the nursing notes.                              Differential diagnosis includes, but is not limited to, viral illness, pneumonia, UTI, mass, nephrolithiasis, colitis, diverticulitis.  For the slight confusion, patient appears alert and oriented here, no focal deficits on exam, it sounds like that confusion several days ago was when she did not feel well, went to sleep and did not know what time it is when she woke up.  She is on Coumadin  and did have a fall several weeks ago but reports that CT head was negative.  No nuchal rigidity or altered mental status here to suggest meningitis, will repeat CT head here.  Will get labs, EKG, troponin, chest x-ray, CT abdomen pelvis, UA, respiratory viral panel.   Patient's presentation is most consistent with acute presentation with potential threat to life or bodily function.  Independent interpretation of labs and imaging below.  Given her AKI, patient will need to be admitted for further management.  Consulted hospitalist who will admit the patient.  She is admitted.  The patient is on the cardiac monitor to evaluate for evidence of arrhythmia and/or significant heart rate  changes.   Clinical Course as of 01/18/24 1511  Tue Jan 18, 2024  1041 Independent review of labs, no leukocytosis, electrolytes not severely deranged, she does have an AKI, LFTs are not elevated, troponin is not elevated.  On independent chart review, her creatinine  on October 9 was 0.7. [TT]  1041 DG Chest 1 View IMPRESSION: 1. Mild diffuse interstitial prominence. 2. Mild cardiomegaly and atherosclerotic calcification of the aortic arch.   [TT]  1107 Resp panel by RT-PCR (RSV, Flu A&B, Covid) Anterior Nasal Swab Negative [TT]  1132 CT ABDOMEN PELVIS WO CONTRAST IMPRESSION: 1. No acute abnormality. 2. Extensive sigmoid and descending colon diverticulosis without evidence of diverticulitis. 3. Mild changes of cirrhosis with borderline splenomegaly. 4. Calcific coronary artery and aortic atherosclerosis.  Aortic Atherosclerosis (ICD10-I70.0).   [TT]  1140 Postvoid residual was 0, UA without gross hematuria. [TT]  1212 CT Head Wo Contrast IMPRESSION: 1. No acute intracranial abnormality. 2. Generalized cerebral atrophy with widening of the extra-axial spaces and ventricular dilatation. 3. Moderate severity bilateral cavernous carotid artery calcification.   [TT]    Clinical Course User Index [TT] Waymond Lorelle Cummins, MD     FINAL CLINICAL IMPRESSION(S) / ED DIAGNOSES   Final diagnoses:  Confusion  AKI (acute kidney injury)  Hematuria, unspecified type  Right lower quadrant abdominal pain     Rx / DC Orders   ED Discharge Orders     None        Note:  This document was prepared using Dragon voice recognition software and may include unintentional dictation errors.    Waymond Lorelle Cummins, MD 01/18/24 [phone]

## 2024-01-19 DIAGNOSIS — N179 Acute kidney failure, unspecified: Secondary | ICD-10-CM | POA: Diagnosis not present

## 2024-01-19 LAB — CBC
HCT: 36 % (ref 36.0–46.0)
Hemoglobin: 12.1 g/dL (ref 12.0–15.0)
MCH: 27.1 pg (ref 26.0–34.0)
MCHC: 33.6 g/dL (ref 30.0–36.0)
MCV: 80.5 fL (ref 80.0–100.0)
Platelets: 190 K/uL (ref 150–400)
RBC: 4.47 MIL/uL (ref 3.87–5.11)
RDW: 14.6 % (ref 11.5–15.5)
WBC: 6.2 K/uL (ref 4.0–10.5)
nRBC: 0 % (ref 0.0–0.2)

## 2024-01-19 LAB — RESPIRATORY PANEL BY PCR

## 2024-01-19 LAB — CK: Total CK: 26 U/L — ABNORMAL LOW (ref 38–234)

## 2024-01-19 LAB — BASIC METABOLIC PANEL WITH GFR
Anion gap: 9 (ref 5–15)
BUN: 30 mg/dL — ABNORMAL HIGH (ref 8–23)
CO2: 21 mmol/L — ABNORMAL LOW (ref 22–32)
Calcium: 8.3 mg/dL — ABNORMAL LOW (ref 8.9–10.3)
Chloride: 107 mmol/L (ref 98–111)
Creatinine, Ser: 1.92 mg/dL — ABNORMAL HIGH (ref 0.44–1.00)
GFR, Estimated: 27 mL/min — ABNORMAL LOW (ref >60)
Glucose, Bld: 146 mg/dL — ABNORMAL HIGH (ref 70–99)
Potassium: 3.3 mmol/L — ABNORMAL LOW (ref 3.5–5.1)
Sodium: 137 mmol/L (ref 135–145)

## 2024-01-19 LAB — PROTIME-INR
INR: 1.5 — ABNORMAL HIGH (ref 0.8–1.2)
Prothrombin Time: 19.1 s — ABNORMAL HIGH (ref 11.4–15.2)

## 2024-01-19 LAB — MAGNESIUM: Magnesium: 1.9 mg/dL (ref 1.7–2.4)

## 2024-01-19 MED ORDER — CITALOPRAM HYDROBROMIDE 20 MG PO TABS
20.0000 mg | ORAL_TABLET | Freq: Every day | ORAL | Status: DC
  Administered 2024-01-19 – 2024-01-20 (×2): 20 mg via ORAL
  Filled 2024-01-19 (×2): qty 1

## 2024-01-19 MED ORDER — VITAMIN B-12 1000 MCG PO TABS
1000.0000 ug | ORAL_TABLET | Freq: Every day | ORAL | Status: DC
  Administered 2024-01-19 – 2024-01-20 (×2): 1000 ug via ORAL
  Filled 2024-01-19 (×2): qty 1

## 2024-01-19 MED ORDER — POTASSIUM CHLORIDE CRYS ER 20 MEQ PO TBCR
40.0000 meq | EXTENDED_RELEASE_TABLET | Freq: Once | ORAL | Status: AC
  Administered 2024-01-19: 40 meq via ORAL
  Filled 2024-01-19: qty 2

## 2024-01-19 MED ORDER — WARFARIN SODIUM 6 MG PO TABS
9.0000 mg | ORAL_TABLET | Freq: Once | ORAL | Status: AC
Start: 2024-01-19 — End: 2024-01-19
  Administered 2024-01-19: 9 mg via ORAL
  Filled 2024-01-19: qty 1

## 2024-01-19 MED ORDER — ENOXAPARIN SODIUM 100 MG/ML IJ SOSY
1.0000 mg/kg | PREFILLED_SYRINGE | Freq: Two times a day (BID) | INTRAMUSCULAR | Status: DC
Start: 2024-01-19 — End: 2024-01-20
  Administered 2024-01-19 – 2024-01-20 (×3): 90 mg via SUBCUTANEOUS
  Filled 2024-01-19 (×3): qty 1

## 2024-01-19 MED ORDER — PANTOPRAZOLE SODIUM 40 MG PO TBEC
40.0000 mg | DELAYED_RELEASE_TABLET | Freq: Every day | ORAL | Status: DC
  Administered 2024-01-19 – 2024-01-20 (×2): 40 mg via ORAL
  Filled 2024-01-19 (×2): qty 1

## 2024-01-19 MED ORDER — DILTIAZEM HCL ER COATED BEADS 120 MG PO CP24
240.0000 mg | ORAL_CAPSULE | Freq: Every day | ORAL | Status: DC
  Administered 2024-01-19 – 2024-01-20 (×2): 240 mg via ORAL
  Filled 2024-01-19 (×2): qty 2

## 2024-01-19 MED ORDER — BUSPIRONE HCL 10 MG PO TABS
10.0000 mg | ORAL_TABLET | Freq: Every day | ORAL | Status: DC
  Administered 2024-01-19 – 2024-01-20 (×2): 10 mg via ORAL
  Filled 2024-01-19 (×2): qty 1

## 2024-01-19 MED ORDER — PRAVASTATIN SODIUM 20 MG PO TABS
40.0000 mg | ORAL_TABLET | Freq: Every evening | ORAL | Status: DC
Start: 2024-01-19 — End: 2024-01-20
  Administered 2024-01-19: 40 mg via ORAL
  Filled 2024-01-19: qty 2

## 2024-01-19 NOTE — Consult Note (Signed)
 PHARMACY - ANTICOAGULATION CONSULT NOTE  Pharmacy Consult for Warfarin Indication: atrial fibrillation and Hx of VTE  Allergies  Allergen Reactions   Metoprolol  Shortness Of Breath   Other Other (See Comments)    Sodium pentothal  Causes blood clots    Patient Measurements: Height: 5' 5 (165.1 cm) Weight: 89.2 kg (196 lb 10.4 oz) IBW/kg (Calculated) : 57 HEPARIN DW (KG): 77  Vital Signs: Temp: 97.6 F (36.4 C) (10/29 0253) Temp Source: Oral (10/29 0253) BP: 116/65 (10/29 0253) Pulse Rate: 71 (10/29 0253)  Labs: Recent Labs    01/18/24 0933 01/19/24 0454  HGB 13.2 12.1  HCT 39.8 36.0  PLT 207 190  LABPROT 18.6* 19.1*  INR 1.5* 1.5*  CREATININE 2.15* 1.92*  TROPONINIHS 6  --     Estimated Creatinine Clearance: 27.5 mL/min (A) (by C-G formula based on SCr of 1.92 mg/dL (H)).   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 08/22/2007   L pretibial inf   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 01/30/2010   R inf pretibial - ED&C    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 04/14/2016   L mid med pretibial    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  05/20/2016   L mid to distal med pretibial    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 07/20/2016   L mid med pretibial    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 05/10/2017   L mid pretibial    Personal history of chemotherapy    1 round   Personal history of radiation therapy    Skin cancer    squamous cell   Squamous cell carcinoma of skin 03/24/2006   L sup pretibial    Squamous cell carcinoma of skin 03/24/2006   R pretibial inf    Squamous cell carcinoma of skin 05/25/2006   L lat mid lower leg    Squamous cell carcinoma of skin 06/29/2006   R pretibial lat    Squamous cell carcinoma of skin 10/17/2012   R prox pretibial ED&C   Squamous cell carcinoma of skin 12/19/2012   R prox sup pretibial    Squamous cell carcinoma of skin 02/03/2016   L med mid pretibial    Squamous cell carcinoma of skin 08/20/2016   L pretibial sup    Squamous cell carcinoma of skin 08/20/2016   L pretibial inf    Squamous cell carcinoma of skin 10/10/2018   L prox med pretibial    Squamous cell carcinoma of skin 06/29/2019   L calf    Wears dentures    full  upper and lower    Medications:  Medications Prior to Admission  Medication Sig Dispense Refill Last Dose/Taking   albuterol  (VENTOLIN  HFA) 108 (90 Base) MCG/ACT inhaler Inhale into the lungs every 6 (six) hours as needed for wheezing or shortness of breath.   Unknown   bumetanide (BUMEX) 1 MG tablet Take 1 mg by mouth daily.   01/17/2024   busPIRone  (BUSPAR ) 10 MG tablet Take 10 mg by mouth daily.    01/17/2024   citalopram  (CELEXA ) 20 MG tablet Take 20 mg by mouth daily.    01/17/2024   cyanocobalamin (VITAMIN B12) 1000 MCG tablet Take 1,000 mcg by mouth daily.   01/17/2024   diltiazem  (CARDIZEM  CD) 240 MG 24 hr capsule Take 240 mg by mouth daily.   01/17/2024   LORazepam  (ATIVAN ) 1 MG tablet Take 1 mg by mouth daily as needed for anxiety.   Unknown   losartan (COZAAR) 100 MG tablet Take 100 mg by mouth daily.    01/17/2024   Multiple Vitamins-Minerals (VISION FORMULA PO) Take by mouth daily.   01/17/2024   pantoprazole  (PROTONIX ) 40 MG tablet Take 1 tablet (40 mg total) by mouth daily. 30 tablet 11 01/17/2024   potassium chloride  (KLOR-CON ) 10 MEQ tablet Take 10 mEq by mouth daily.   01/17/2024   pravastatin  (PRAVACHOL ) 40 MG tablet Take 40 mg by mouth daily.    01/17/2024   warfarin (COUMADIN ) 1 MG tablet Take 1 mg by mouth daily. Take 1/2 tablet every day (total of 7.5 mg on Thursday and Saturday and 5.5 mg all other days)   01/17/2024   warfarin (COUMADIN ) 2 MG tablet Take 2 mg by mouth daily. Take 1 tablet on Thursday and Saturday. Take along with 5 mg and half of 1 mg tablet (Total 7.5 mg)   01/17/2024   warfarin (COUMADIN ) 5 MG tablet Take 5 mg by mouth See admin instructions. Takes 5 mg tablets daily (7.5 mg on Thursday and Saturday and 5.5 mg all other days)   01/17/2024   furosemide  (LASIX ) 40 MG tablet Take 40 mg by mouth daily as needed. (Patient not taking: Reported on 01/18/2024)   Not Taking   tacrolimus  (PROTOPIC ) 0.1 % ointment Apply topically 2 (two) times daily. To lower legs as needed. (Patient not taking: Reported on 01/18/2024) 100 g 5 Not Taking   Scheduled:   busPIRone   10 mg Oral Daily   citalopram   20 mg Oral Daily   cyanocobalamin  1,000 mcg Oral Daily   diltiazem   240 mg Oral Daily   pantoprazole   40 mg Oral Daily   pravastatin   40 mg Oral QPM   sodium chloride  flush  3 mL Intravenous Q12H   Warfarin - Pharmacist Dosing Inpatient   Does not apply q1600   Infusions:  PRN: acetaminophen  **OR** acetaminophen , senna-docusate Anti-infectives (From admission, onward)    None      Assessment: 78 y.o. year old female with past medical history of hypertension, hyperlipidemia, A-fib on Coumadin , history of PE and DVT presenting to the ED from [facility] due to malaise, fatigue and confusion and admitted for an AKI. Per RN note, concern for hematuria and discussion with  MD ok to restart warfarin at this time. CHADSVASc of 6 and INR is subtherapeutic. Spoke with patient and she takes warfarin 7.5 mg on Thursdays and Saturdays and 5.5 mg all other days. Pt admits to missing a few doses this week.   Date INR Warfarin Dose  10/28 1.5 7.5 mg  10/29 1.5 9 mg   Goal of Therapy:  INR 2-3 Monitor platelets by anticoagulation protocol: Yes   Plan: . INR remains subtherapeutic, CBC WNL Give warfarin 9 mg x1 today (increase from home dose) Per MD, will start lovenox 90 mg SQ every 12 hours Continue to bridge with lovenox until INR is therapeutic Monitor INR daily while inpatient CBC at least every 72 hours  Thank you for involving pharmacy in this patient's care.   Damien Napoleon, PharmD Clinical Pharmacist 01/19/2024 7:27 AM

## 2024-01-19 NOTE — Plan of Care (Signed)

## 2024-01-19 NOTE — Progress Notes (Addendum)
 PROGRESS NOTE    Stacy Reese  FMW:[id number] DOB: 30-Jan-1946 DOA: 01/18/2024 PCP: Lenon Layman ORN, MD  Chief Complaint  Patient presents with   Altered Mental Status    Hospital Course:  Stacy Reese is a 78 y.o. year old female with past medical history of hypertension, hyperlipidemia, A-fib on Coumadin , history of PE and DVT presenting to the ED from [facility] clinic due to malaise, fatigue and confusion and admitted for an AKI. Patient was feeling unwell since last week with symptoms of congestion, myalgias, vomiting, diarrhea.  Patient believes she may have had COVID.  Subjective: Patient was examined at bedside, states she feels better today.  Continues to have some fatigue and malaise.  Confusion resolved.  No evidence of hematuria Will hold diuretics and monitor creatinine.  If creatinine improves -anticipate discharge tomorrow   Objective: Vitals:   01/18/24 1359 01/18/24 2016 01/19/24 0253 01/19/24 0748  BP: (!) 151/89 128/83 116/65 (!) 125/92  Pulse: 79 78 71 71  Resp: 16 17 17 17   Temp: 97.6 F (36.4 C) 97.7 F (36.5 C) 97.6 F (36.4 C) 97.9 F (36.6 C)  TempSrc:   Oral Oral  SpO2: 100% 97% 97% 96%  Weight:   89.2 kg   Height:        Intake/Output Summary (Last 24 hours) at 01/19/2024 9077 Last data filed at 01/18/2024 1144 Gross per 24 hour  Intake --  Output 200 ml  Net -200 ml   Filed Weights   01/18/24 0920 01/19/24 0253  Weight: 90.3 kg 89.2 kg    Examination: Gen: NAD CV: Irregularly irregular rhythm, good pulses Resp: CTAB, no rales, or wheeze Abd: No TTP to deep palpation, normal bowel sounds MSK: No asymmetry Skin: No lesions noted on examined skin Neuro: Alert and oriented x 4 Psych: Normal mood  Assessment & Plan:  AKI - Likely pre-renal, due to poor po intake as patient was sick, had vomiting, diarrhea and was taking bumex - Cr 2.15 -> 1.92, baseline Cr 0.7-0.8 - s/p IV fluids, No signs suggest heart failure and last echo  reviewed showed normal EF - Hold ARB, Bumex - Monitor Cr  URI - Flu/COVID/RSV negative - check RVP   Hematuria - Noted by patient over the last week.  UA without hemoglobinuria and hemoglobin at baseline.  No signs of UTI - continue her Coumadin  and monitor for further episodes.   - Given that she is reporting this, she may warrant further workup outpatient   Hypokalemia - Monitor and replete as needed   Hyponatremia - resolved - s/p IV fluids  Chronic HFpEF - On Bumex at home, euvolemic on exam. Held diuretics   A-fib: Appears rate controlled Continue Cardizem , On coumadin  (subtherapeutic) - On Lovenox for bridging   Hypertension: Holding ARB given AKI will continue Cardizem  and can use as needed IV meds.  Holding home diuretics.   MDD/GAD: Continue home meds   GERD: Continue home meds  -- PT/OT rec Home PT/OT  DVT prophylaxis: Coumadin    Code Status: Limited: Do not attempt resuscitation (DNR) -DNR-LIMITED -Do Not Intubate/DNI  Disposition:  Home with Home health  Consultants:  None  Procedures:  None  Antimicrobials:  Anti-infectives (From admission, onward)    None       Data Reviewed: I have personally reviewed following labs and imaging studies CBC: Recent Labs  Lab 01/18/24 0933 01/19/24 0454  WBC 7.3 6.2  HGB 13.2 12.1  HCT 39.8 36.0  MCV 81.9 80.5  PLT 207 190   Basic Metabolic Panel: Recent Labs  Lab 01/18/24 0933 01/19/24 0454  NA 134* 137  K 3.4* 3.3*  CL 101 107  CO2 20* 21*  GLUCOSE 152* 146*  BUN 29* 30*  CREATININE 2.15* 1.92*  CALCIUM 8.3* 8.3*  MG 1.8  --    GFR: Estimated Creatinine Clearance: 27.5 mL/min (A) (by C-G formula based on SCr of 1.92 mg/dL (H)). Liver Function Tests: Recent Labs  Lab 01/18/24 0933  AST 25  ALT 26  ALKPHOS 56  BILITOT 0.9  PROT 7.0  ALBUMIN 3.0*   CBG: No results for input(s): GLUCAP in the last 168 hours.  Recent Results (from the past 240 hours)  Resp panel by RT-PCR (RSV,  Flu A&B, Covid) Anterior Nasal Swab     Status: None   Collection Time: 01/18/24  9:39 AM   Specimen: Anterior Nasal Swab  Result Value Ref Range Status   SARS Coronavirus 2 by RT PCR NEGATIVE NEGATIVE Final    Comment: (NOTE) SARS-CoV-2 target nucleic acids are NOT DETECTED.  The SARS-CoV-2 RNA is generally detectable in upper respiratory specimens during the acute phase of infection. The lowest concentration of SARS-CoV-2 viral copies this assay can detect is 138 copies/mL. A negative result does not preclude SARS-Cov-2 infection and should not be used as the sole basis for treatment or other patient management decisions. A negative result may occur with  improper specimen collection/handling, submission of specimen other than nasopharyngeal swab, presence of viral mutation(s) within the areas targeted by this assay, and inadequate number of viral copies(<138 copies/mL). A negative result must be combined with clinical observations, patient history, and epidemiological information. The expected result is Negative.  Fact Sheet for Patients:  bloggercourse.com  Fact Sheet for Healthcare Providers:  seriousbroker.it  This test is no t yet approved or cleared by the [place]  FDA and  has been authorized for detection and/or diagnosis of SARS-CoV-2 by FDA under an Emergency Use Authorization (EUA). This EUA will remain  in effect (meaning this test can be used) for the duration of the COVID-19 declaration under Section 564(b)(1) of the Act, 21 U.S.C.section 360bbb-3(b)(1), unless the authorization is terminated  or revoked sooner.       Influenza A by PCR NEGATIVE NEGATIVE Final   Influenza B by PCR NEGATIVE NEGATIVE Final    Comment: (NOTE) The Xpert Xpress SARS-CoV-2/FLU/RSV plus assay is intended as an aid in the diagnosis of influenza from Nasopharyngeal swab specimens and should not be used as a sole basis for  treatment. Nasal washings and aspirates are unacceptable for Xpert Xpress SARS-CoV-2/FLU/RSV testing.  Fact Sheet for Patients: bloggercourse.com  Fact Sheet for Healthcare Providers: seriousbroker.it  This test is not yet approved or cleared by the [place]  FDA and has been authorized for detection and/or diagnosis of SARS-CoV-2 by FDA under an Emergency Use Authorization (EUA). This EUA will remain in effect (meaning this test can be used) for the duration of the COVID-19 declaration under Section 564(b)(1) of the Act, 21 U.S.C. section 360bbb-3(b)(1), unless the authorization is terminated or revoked.     Resp Syncytial Virus by PCR NEGATIVE NEGATIVE Final    Comment: (NOTE) Fact Sheet for Patients: bloggercourse.com  Fact Sheet for Healthcare Providers: seriousbroker.it  This test is not yet approved or cleared by the [place]  FDA and has been authorized for detection and/or diagnosis of SARS-CoV-2 by FDA under an Emergency Use Authorization (EUA). This EUA will remain in effect (meaning this test  can be used) for the duration of the COVID-19 declaration under Section 564(b)(1) of the Act, 21 U.S.C. section 360bbb-3(b)(1), unless the authorization is terminated or revoked.  Performed at [facility], [address] [place], [place] [address]      Radiology Studies: CT Head Wo Contrast Result Date: 01/18/2024 CLINICAL DATA:  Intermittent episodes of altered mental status. EXAM: CT HEAD WITHOUT CONTRAST TECHNIQUE: Contiguous axial images were obtained from the base of the skull through the vertex without intravenous contrast. RADIATION DOSE REDUCTION: This exam was performed according to the departmental dose-optimization program which includes automated exposure control, adjustment of the mA and/or kV according to patient size and/or use of iterative  reconstruction technique. COMPARISON:  None Available. FINDINGS: Brain: There is mild cerebral atrophy with widening of the extra-axial spaces and ventricular dilatation. There are areas of decreased attenuation within the white matter tracts of the supratentorial brain, consistent with microvascular disease changes. Vascular: Moderate severity bilateral cavernous carotid artery calcification is noted. Skull: Normal. Negative for fracture or focal lesion. Sinuses/Orbits: No acute finding. Other: None. IMPRESSION: 1. No acute intracranial abnormality. 2. Generalized cerebral atrophy with widening of the extra-axial spaces and ventricular dilatation. 3. Moderate severity bilateral cavernous carotid artery calcification. Electronically Signed   By: Suzen Dials M.D.   On: 01/18/2024 11:36   CT ABDOMEN PELVIS WO CONTRAST Result Date: 01/18/2024 CLINICAL DATA:  Lower back and abdominal pain for the past week. Hematuria. EXAM: CT ABDOMEN AND PELVIS WITHOUT CONTRAST TECHNIQUE: Multidetector CT imaging of the abdomen and pelvis was performed following the standard protocol without IV contrast. RADIATION DOSE REDUCTION: This exam was performed according to the departmental dose-optimization program which includes automated exposure control, adjustment of the mA and/or kV according to patient size and/or use of iterative reconstruction technique. COMPARISON:  08/04/2018 FINDINGS: Lower chest: Mildly enlarged heart. Atheromatous calcifications, including the coronary arteries and aorta. Mild bibasilar atelectasis/scarring. Hepatobiliary: Mildly lobulated liver contours with hypertrophy of the lateral segment of the left lobe and caudate lobe of the liver. The right lobe is relatively small. Poorly distended, normal-appearing gallbladder. Pancreas: Unremarkable. No pancreatic ductal dilatation or surrounding inflammatory changes. Spleen: Borderline enlarged, measuring 12.7 cm in length. Adrenals/Urinary Tract: Adrenal  glands are unremarkable. Kidneys are normal, without renal calculi, focal lesion, or hydronephrosis. Bladder is unremarkable. Stomach/Bowel: Extensive sigmoid and descending colon diverticulosis without evidence of diverticulitis. Unremarkable stomach, small bowel and appendix. Vascular/Lymphatic: Atheromatous arterial calcifications without aneurysm. No enlarged lymph nodes. Reproductive: Uterus and bilateral adnexa are unremarkable. Other: Stable minimal umbilical hernia containing fat. No abdominopelvic ascites. Musculoskeletal: Lumbar and lower thoracic spine degenerative changes. IMPRESSION: 1. No acute abnormality. 2. Extensive sigmoid and descending colon diverticulosis without evidence of diverticulitis. 3. Mild changes of cirrhosis with borderline splenomegaly. 4. Calcific coronary artery and aortic atherosclerosis. Aortic Atherosclerosis (ICD10-I70.0). Electronically Signed   By: Elspeth Bathe M.D.   On: 01/18/2024 11:22   DG Chest 1 View Result Date: 01/18/2024 EXAM: 1 VIEW(S) XRAY OF THE CHEST 01/18/2024 09:55:00 AM COMPARISON: AP radiographs of the chest dated 05/03/2021. CLINICAL HISTORY: sob. Pt here from [facility]. Pt has been having hematuria, lower abd pain and lower back pain x1 week. Pt has also been having AMS x1 week. States that she has been forgetful having intermittent moments of not knowing who she is or where she is. Has also had ; congestion, SHOB, fatigue, and body aches. FINDINGS: LUNGS AND PLEURA: Mild diffuse interstitial prominence. No focal pulmonary opacity. No pulmonary edema. No pleural effusion.  No pneumothorax. HEART AND MEDIASTINUM: Mild cardiomegaly. Atherosclerotic calcification of the aortic arch. BONES AND SOFT TISSUES: No acute osseous abnormality. IMPRESSION: 1. Mild diffuse interstitial prominence. 2. Mild cardiomegaly and atherosclerotic calcification of the aortic arch. Electronically signed by: Timothy Berrigan MD 01/18/2024 10:13 AM EDT RP Workstation: [id number]     Scheduled Meds:  busPIRone   10 mg Oral Daily   citalopram   20 mg Oral Daily   cyanocobalamin  1,000 mcg Oral Daily   diltiazem   240 mg Oral Daily   enoxaparin (LOVENOX) injection  1 mg/kg Subcutaneous Q12H   pantoprazole   40 mg Oral Daily   pravastatin   40 mg Oral QPM   sodium chloride  flush  3 mL Intravenous Q12H   warfarin  9 mg Oral ONCE-1600   Warfarin - Pharmacist Dosing Inpatient   Does not apply q1600   Continuous Infusions:   LOS: 1 day  MDM: Patient is high risk for one or more organ failure.  They necessitate ongoing hospitalization for continued IV therapies and subsequent lab monitoring. Total time spent interpreting labs and vitals, reviewing the medical record, coordinating care amongst consultants and care team members, directly assessing and discussing care with the patient and/or family: 55 min Laree Lock, MD [facility]  To contact the attending physician between 7A-7P please use Epic Chat. To contact the covering physician during after hours 7P-7A, please review Amion.  01/19/2024, 9:22 AM   *This document has been created with the assistance of dictation software. Please excuse typographical errors. *

## 2024-01-19 NOTE — TOC Initial Note (Signed)
 Transition of Care [facility]) - Initial/Assessment Note    Patient Details  Name: Stacy Reese MRN: [id number] Date of Birth: 1945-07-22  [facility]) CM/SW Contact:    Stacy Jackquline RAMAN, RN Phone Number: 01/19/2024, 1:01 PM  Clinical Narrative:   Chart reviewed. RNCM, spoke with the patient at the bedside. I introduced myself, my role, and explained that discharge planning recommendations would be discussed. PT recommended Home with Home Health/PT. Pt declined HH/PT, states that she doesn't feel like she needs it. She was independent prior to admission and is feeling better as the Michaiah Maiden goes on. States that she lives alone with her dog and she has a walker at home if she needs it. Her friend Stacy Reese will be picking her up at the time of discharge. RNCM will continue to follow for discharge planning/care coordination and update as applicable.          Expected Discharge Plan: Home/Self Care Barriers to Discharge: Continued Medical Work up   Patient Goals and CMS Choice            Expected Discharge Plan and Services       Living arrangements for the past 2 months: Single Family Home                                      Prior Living Arrangements/Services Living arrangements for the past 2 months: Single Family Home Lives with:: Self, Pets Patient language and need for interpreter reviewed:: Yes Do you feel safe going back to the place where you live?: Yes      Need for Family Participation in Patient Care: Yes (Comment) Care giver support system in place?: Yes (comment) Current home services: DME Criminal Activity/Legal Involvement Pertinent to Current Situation/Hospitalization: No - Comment as needed  Activities of Daily Living   ADL Screening (condition at time of admission) Independently performs ADLs?: Yes (appropriate for developmental age) Is the patient deaf or have difficulty hearing?: No Does the patient have difficulty seeing, even when wearing  glasses/contacts?: No Does the patient have difficulty concentrating, remembering, or making decisions?: No  Permission Sought/Granted                  Emotional Assessment Appearance:: Appears stated age, Well-Groomed Attitude/Demeanor/Rapport: Engaged, Self-Confident, Gracious Affect (typically observed): Calm, Accepting, Hopeful, Pleasant, Quiet Orientation: : Oriented to Self, Oriented to Place, Oriented to  Time, Oriented to Situation Alcohol / Substance Use: Not Applicable Psych Involvement: No (comment)  Admission diagnosis:  Confusion [R41.0] AKI (acute kidney injury) [N17.9] Acute kidney injury [N17.9] Right lower quadrant abdominal pain [R10.31] Hematuria, unspecified type [R31.9] Patient Active Problem List   Diagnosis Date Noted   Acute kidney injury 01/18/2024   History of tobacco use 04/14/2022   Exertional dyspnea 04/14/2022   Shortness of breath    Acute CHF (congestive heart failure) (HCC) 05/03/2021   Memory change 11/13/2019   Hyperlipidemia 08/23/2018   Atherosclerosis of native arteries of extremity with intermittent claudication 08/23/2018   PVD (peripheral vascular disease) 08/16/2018   Upper GI bleed 08/05/2018   Melena    Epigastric pain    Diabetes mellitus type 2, controlled, with complications (HCC) 06/21/2018   Heterozygous factor V Leiden mutation 06/02/2016   Pulmonary embolism (HCC) 12/04/2015   History of pulmonary embolism 12/04/2015   Health care maintenance 03/21/2015   Primary cancer of lower outer quadrant of left female breast (  HCC) 09/22/2014   Major depression in remission 02/12/2014   A-fib (HCC) 08/17/2013   HTN (hypertension), benign 08/17/2013   Osteopenia 08/17/2013   PCP:  Lenon Layman ORN, MD Pharmacy:   [id number] [place], [place] - [address] AT [facility] OF [address] [place] [address] Phone: [phone] Fax: [phone]     Social Drivers of  Health (SDOH) Social History: SDOH Screenings   Food Insecurity: No Food Insecurity (01/18/2024)  Housing: Unknown (01/18/2024)  Transportation Needs: No Transportation Needs (01/18/2024)  Utilities: Not At Risk (01/18/2024)  Financial Resource Strain: Low Risk  (05/18/2023)   Received from [facility]  Social Connections: Moderately Integrated (01/18/2024)  Tobacco Use: Medium Risk (01/18/2024)   SDOH Interventions:     Readmission Risk Interventions     No data to display

## 2024-01-19 NOTE — Evaluation (Addendum)
 Physical Therapy Evaluation Patient Details Name: Stacy Reese MRN: [id number] DOB: 1945-04-11 Today's Date: 01/19/2024  History of Present Illness  Pt is a 78 y/o F admitted on 01/18/24 after presenting with c/o malaise, fatigue & confusion. Pt is being treated for AKI. PMH: HTN, HLD, a-fib on coumadin , PE, DVT  Clinical Impression  Pt is pleasant 78 y.o. female admitted for acute kidney injury. Prior to hospitalization, pt independent with amb and ADLs without need for AD. Pt lives alone and has family/neighbors available PRN. Pt performs bed mobility with mod I. Pt demonstrates STS and step pivot transfer with CGA when using RW. Attempt to perform mobility tasks without AD made, however pt very unsteady. Pt benefits from use of RW for safety. Verbal cues provided for hand placement while performing STS transfer. Pt able to amb 223ft with CGA using RW with HR inc to 144bpm at the highest. Pt demonstrates deficits in balance/strength. Would benefit from skilled PT to address above deficits and promote optimal return to PLOF.         If plan is discharge home, recommend the following: A little help with walking and/or transfers;A little help with bathing/dressing/bathroom;Help with stairs or ramp for entrance   Can travel by private vehicle        Equipment Recommendations Rolling walker (2 wheels)  Recommendations for Other Services       Functional Status Assessment Patient has had a recent decline in their functional status and demonstrates the ability to make significant improvements in function in a reasonable and predictable amount of time.     Precautions / Restrictions Restrictions Weight Bearing Restrictions Per Provider Order: No      Mobility  Bed Mobility Overal bed mobility: Needs Assistance Bed Mobility: Supine to Sit     Supine to sit: Modified independent (Device/Increase time)     General bed mobility comments: inc time/effort to perform supine>sit. No use  of bed rails or HOB elevated    Transfers Overall transfer level: Needs assistance Equipment used: None, Rolling walker (2 wheels) Transfers: Sit to/from Stand, Bed to chair/wheelchair/BSC Sit to Stand: Min assist, Contact guard assist   Step pivot transfers: Min assist, Contact guard assist            Ambulation/Gait Ambulation/Gait assistance: Contact guard assist, Min assist Gait Distance (Feet):  (120ft + 169ft) Assistive device: None, Rolling walker (2 wheels) Gait Pattern/deviations: Step-through pattern, Narrow base of support Gait velocity: dec        Stairs            Wheelchair Mobility     Tilt Bed    Modified Rankin (Stroke Patients Only)       Balance Overall balance assessment: Needs assistance Sitting-balance support: Feet supported, No upper extremity supported Sitting balance-Leahy Scale: Good Sitting balance - Comments: Able to maintain seated balance with SBA for safety   Standing balance support: During functional activity, Reliant on assistive device for balance, Bilateral upper extremity supported Standing balance-Leahy Scale: Fair Standing balance comment: unsteady without AD requiring min A for safety. CGA-SBA with RW.                             Pertinent Vitals/Pain Pain Assessment Pain Assessment: No/denies pain    Home Living Family/patient expects to be discharged to:: Private residence Living Arrangements: Alone Available Help at Discharge: Family;Neighbor;Available PRN/intermittently Type of Home: House Home Access: Stairs to enter Entrance Stairs-Rails: Right (  Hold onto rail and leans on house to go up stairs) Entrance Stairs-Number of Steps: 3   Home Layout: One level Home Equipment: Rollator (4 wheels)      Prior Function Prior Level of Function : Independent/Modified Independent;Driving;History of Falls (last six months)             Mobility Comments: Pt amb without AD independently. 1 fall in  the past 6 months and multiple near-falls ADLs Comments: independent.     Extremity/Trunk Assessment   Upper Extremity Assessment Upper Extremity Assessment: Overall WFL for tasks assessed    Lower Extremity Assessment Lower Extremity Assessment: Generalized weakness    Cervical / Trunk Assessment Cervical / Trunk Assessment: Normal  Communication   Communication Communication: No apparent difficulties    Cognition Arousal: Alert Behavior During Therapy: WFL for tasks assessed/performed   PT - Cognitive impairments: No apparent impairments                       PT - Cognition Comments: pleasant and agreeable to PT Following commands: Intact       Cueing Cueing Techniques: Verbal cues     General Comments General comments (skin integrity, edema, etc.): HR up to 144 bpm during session.    Exercises Other Exercises Other Exercises: Edu about role of PT in acute setting. Edu on beenfits of AD to avoid falls and safety practices at home   Assessment/Plan    PT Assessment Patient needs continued PT services  PT Problem List Decreased strength;Decreased balance;Decreased mobility;Decreased knowledge of use of DME       PT Treatment Interventions DME instruction;Gait training;Stair training;Functional mobility training;Therapeutic activities;Therapeutic exercise;Patient/family education;Balance training;Neuromuscular re-education    PT Goals (Current goals can be found in the Care Plan section)  Acute Rehab PT Goals Patient Stated Goal: to avoid falls PT Goal Formulation: With patient Time For Goal Achievement: 02/02/24 Potential to Achieve Goals: Good    Frequency Min 2X/week     Co-evaluation               AM-PAC PT 6 Clicks Mobility  Outcome Measure Help needed turning from your back to your side while in a flat bed without using bedrails?: None Help needed moving from lying on your back to sitting on the side of a flat bed without using  bedrails?: None Help needed moving to and from a bed to a chair (including a wheelchair)?: A Little Help needed standing up from a chair using your arms (e.g., wheelchair or bedside chair)?: A Little Help needed to walk in hospital room?: A Little Help needed climbing 3-5 steps with a railing? : A Little 6 Click Score: 20    End of Session Equipment Utilized During Treatment: Gait belt Activity Tolerance: Patient tolerated treatment well Patient left: in chair;with call bell/phone within reach;with chair alarm set Nurse Communication: Mobility status PT Visit Diagnosis: Unsteadiness on feet (R26.81)    Time: 9091-9069 PT Time Calculation (min) (ACUTE ONLY): 22 min   Charges:   PT Evaluation $PT Eval Low Complexity: 1 Low   PT General Charges $$ ACUTE PT VISIT: 1 Visit         Luvenia Avers, SPT   Amil Bouwman 01/19/2024, 10:27 AM

## 2024-01-20 DIAGNOSIS — N179 Acute kidney failure, unspecified: Secondary | ICD-10-CM | POA: Diagnosis not present

## 2024-01-20 LAB — BASIC METABOLIC PANEL WITH GFR
Anion gap: 9 (ref 5–15)
BUN: 24 mg/dL — ABNORMAL HIGH (ref 8–23)
CO2: 22 mmol/L (ref 22–32)
Calcium: 8.6 mg/dL — ABNORMAL LOW (ref 8.9–10.3)
Chloride: 110 mmol/L (ref 98–111)
Creatinine, Ser: 1.38 mg/dL — ABNORMAL HIGH (ref 0.44–1.00)
GFR, Estimated: 40 mL/min — ABNORMAL LOW (ref >60)
Glucose, Bld: 123 mg/dL — ABNORMAL HIGH (ref 70–99)
Potassium: 3.7 mmol/L (ref 3.5–5.1)
Sodium: 141 mmol/L (ref 135–145)

## 2024-01-20 LAB — CBC
HCT: 36.3 % (ref 36.0–46.0)
Hemoglobin: 12.2 g/dL (ref 12.0–15.0)
MCH: 27.4 pg (ref 26.0–34.0)
MCHC: 33.6 g/dL (ref 30.0–36.0)
MCV: 81.6 fL (ref 80.0–100.0)
Platelets: 213 K/uL (ref 150–400)
RBC: 4.45 MIL/uL (ref 3.87–5.11)
RDW: 14.6 % (ref 11.5–15.5)
WBC: 5.4 K/uL (ref 4.0–10.5)
nRBC: 0 % (ref 0.0–0.2)

## 2024-01-20 LAB — MAGNESIUM: Magnesium: 1.9 mg/dL (ref 1.7–2.4)

## 2024-01-20 LAB — PROTIME-INR
INR: 1.8 — ABNORMAL HIGH (ref 0.8–1.2)
Prothrombin Time: 21.6 s — ABNORMAL HIGH (ref 11.4–15.2)

## 2024-01-20 MED ORDER — WARFARIN SODIUM 7.5 MG PO TABS
7.5000 mg | ORAL_TABLET | Freq: Once | ORAL | Status: DC
  Filled 2024-01-20: qty 1

## 2024-01-20 NOTE — Discharge Summary (Signed)
 Physician Discharge Summary   Patient: Stacy Reese MRN: [id number] DOB: 27-May-1945  Admit date:     01/18/2024  Discharge date: 01/20/24  Discharge Physician: Laree Lock   PCP: Lenon Layman ORN, MD   Recommendations at discharge:   Follow-up with PCP in 3 to 5 days -repeat BMP (creatinine, potassium), monitor blood pressure Resume Bumex, losartan as needed  Follow-up in INR clinic in 3 days  Discharge Diagnoses: Principal Problem:   Acute kidney injury Active Problems:   Heterozygous factor V Leiden mutation   Hyperlipidemia   A-fib (HCC)   Diabetes mellitus type 2, controlled, with complications (HCC)   HTN (hypertension), benign   Hospital Course: WISDOM SEYBOLD is a 78 y.o. year old female with past medical history of hypertension, hyperlipidemia, A-fib on Coumadin , history of PE and DVT presenting to the ED from [facility] due to malaise, fatigue and confusion and admitted for an AKI. Patient was feeling unwell since last week with symptoms of congestion, myalgias, vomiting, diarrhea.  Patient believes she may have had COVID. Hospital course as below  AKI - improving - Likely pre-renal, due to poor po intake as patient was sick, had vomiting, diarrhea and was taking bumex - Cr 2.15 -> 1.92 -> 1.38, baseline Cr 0.7-0.8 - s/p IV fluids, No signs suggest heart failure and last echo reviewed showed normal EF - Hold ARB, Bumex - Follow up Cr outpatient in 3-5 days   URI - Flu/COVID/RSV negative, RVP negative - symptoms improved   Hematuria - Noted by patient over the last week.  UA without hemoglobinuria and hemoglobin at baseline.  No signs of UTI - continue her Coumadin , recommend follow up with PCP/Urology if has hematuria   Hypokalemia - resolved   Hyponatremia - resolved - s/p IV fluids   Chronic HFpEF - On Bumex at home, euvolemic on exam. Held diuretics until further follow up   A-fib: Appears rate controlled Continue Cardizem , On coumadin   (INR improving, was subtherapeutic) -  was on Lovenox for bridging Follow up in INR clinic  Hypertension: Holding ARB given AKI and BP controlled continue Cardizem      -- PT/OT rec Home PT/OT, patient refused    Consultants: None Procedures performed: None  Disposition: Home Diet recommendation:  Discharge Diet Orders (From admission, onward)     Start     Ordered   01/20/24 0000  Diet - low sodium heart healthy        01/20/24 1324           DISCHARGE MEDICATION: Allergies as of 01/20/2024       Reactions   Metoprolol  Shortness Of Breath   Other Other (See Comments)   Sodium pentothal  Causes blood clots        Medication List     PAUSE taking these medications    bumetanide 1 MG tablet Wait to take this until your doctor or other care provider tells you to start again. Commonly known as: BUMEX Take 1 mg by mouth daily.   losartan 100 MG tablet Wait to take this until your doctor or other care provider tells you to start again. Commonly known as: COZAAR Take 100 mg by mouth daily.       STOP taking these medications    furosemide  40 MG tablet Commonly known as: LASIX        TAKE these medications    albuterol  108 (90 Base) MCG/ACT inhaler Commonly known as: VENTOLIN  HFA Inhale into the lungs every 6 (  six) hours as needed for wheezing or shortness of breath.   busPIRone  10 MG tablet Commonly known as: BUSPAR  Take 10 mg by mouth daily.   citalopram  20 MG tablet Commonly known as: CELEXA  Take 20 mg by mouth daily.   cyanocobalamin 1000 MCG tablet Commonly known as: VITAMIN B12 Take 1,000 mcg by mouth daily.   diltiazem  240 MG 24 hr capsule Commonly known as: CARDIZEM  CD Take 240 mg by mouth daily.   LORazepam  1 MG tablet Commonly known as: ATIVAN  Take 1 mg by mouth daily as needed for anxiety.   pantoprazole  40 MG tablet Commonly known as: Protonix  Take 1 tablet (40 mg total) by mouth daily.   potassium chloride  10 MEQ  tablet Commonly known as: KLOR-CON  Take 10 mEq by mouth daily.   pravastatin  40 MG tablet Commonly known as: PRAVACHOL  Take 40 mg by mouth daily.   tacrolimus  0.1 % ointment Commonly known as: PROTOPIC  Apply topically 2 (two) times daily. To lower legs as needed.   VISION FORMULA PO Take by mouth daily.   warfarin 5 MG tablet Commonly known as: COUMADIN  Take 5 mg by mouth See admin instructions. Takes 5 mg tablets daily (7.5 mg on Thursday and Saturday and 5.5 mg all other days)   warfarin 1 MG tablet Commonly known as: COUMADIN  Take 1 mg by mouth daily. Take 1/2 tablet every day (total of 7.5 mg on Thursday and Saturday and 5.5 mg all other days)   warfarin 2 MG tablet Commonly known as: COUMADIN  Take 2 mg by mouth daily. Take 1 tablet on Thursday and Saturday. Take along with 5 mg and half of 1 mg tablet (Total 7.5 mg)        Discharge Exam: Filed Weights   01/18/24 0920 01/19/24 0253 01/20/24 0456  Weight: 90.3 kg 89.2 kg 82.9 kg   Gen: NAD CV: Irregularly irregular rhythm, good pulses Resp: CTAB, no rales, or wheeze Abd: No TTP to deep palpation, normal bowel sounds MSK: No asymmetry Skin: No lesions noted on examined skin Neuro: Alert and oriented x 4 Psych: Normal mood  Condition at discharge: good  The results of significant diagnostics from this hospitalization (including imaging, microbiology, ancillary and laboratory) are listed below for reference.   Imaging Studies: CT Head Wo Contrast Result Date: 01/18/2024 CLINICAL DATA:  Intermittent episodes of altered mental status. EXAM: CT HEAD WITHOUT CONTRAST TECHNIQUE: Contiguous axial images were obtained from the base of the skull through the vertex without intravenous contrast. RADIATION DOSE REDUCTION: This exam was performed according to the departmental dose-optimization program which includes automated exposure control, adjustment of the mA and/or kV according to patient size and/or use of iterative  reconstruction technique. COMPARISON:  None Available. FINDINGS: Brain: There is mild cerebral atrophy with widening of the extra-axial spaces and ventricular dilatation. There are areas of decreased attenuation within the white matter tracts of the supratentorial brain, consistent with microvascular disease changes. Vascular: Moderate severity bilateral cavernous carotid artery calcification is noted. Skull: Normal. Negative for fracture or focal lesion. Sinuses/Orbits: No acute finding. Other: None. IMPRESSION: 1. No acute intracranial abnormality. 2. Generalized cerebral atrophy with widening of the extra-axial spaces and ventricular dilatation. 3. Moderate severity bilateral cavernous carotid artery calcification. Electronically Signed   By: Suzen Dials M.D.   On: 01/18/2024 11:36   CT ABDOMEN PELVIS WO CONTRAST Result Date: 01/18/2024 CLINICAL DATA:  Lower back and abdominal pain for the past week. Hematuria. EXAM: CT ABDOMEN AND PELVIS WITHOUT CONTRAST TECHNIQUE: Multidetector CT  imaging of the abdomen and pelvis was performed following the standard protocol without IV contrast. RADIATION DOSE REDUCTION: This exam was performed according to the departmental dose-optimization program which includes automated exposure control, adjustment of the mA and/or kV according to patient size and/or use of iterative reconstruction technique. COMPARISON:  08/04/2018 FINDINGS: Lower chest: Mildly enlarged heart. Atheromatous calcifications, including the coronary arteries and aorta. Mild bibasilar atelectasis/scarring. Hepatobiliary: Mildly lobulated liver contours with hypertrophy of the lateral segment of the left lobe and caudate lobe of the liver. The right lobe is relatively small. Poorly distended, normal-appearing gallbladder. Pancreas: Unremarkable. No pancreatic ductal dilatation or surrounding inflammatory changes. Spleen: Borderline enlarged, measuring 12.7 cm in length. Adrenals/Urinary Tract: Adrenal  glands are unremarkable. Kidneys are normal, without renal calculi, focal lesion, or hydronephrosis. Bladder is unremarkable. Stomach/Bowel: Extensive sigmoid and descending colon diverticulosis without evidence of diverticulitis. Unremarkable stomach, small bowel and appendix. Vascular/Lymphatic: Atheromatous arterial calcifications without aneurysm. No enlarged lymph nodes. Reproductive: Uterus and bilateral adnexa are unremarkable. Other: Stable minimal umbilical hernia containing fat. No abdominopelvic ascites. Musculoskeletal: Lumbar and lower thoracic spine degenerative changes. IMPRESSION: 1. No acute abnormality. 2. Extensive sigmoid and descending colon diverticulosis without evidence of diverticulitis. 3. Mild changes of cirrhosis with borderline splenomegaly. 4. Calcific coronary artery and aortic atherosclerosis. Aortic Atherosclerosis (ICD10-I70.0). Electronically Signed   By: Elspeth Bathe M.D.   On: 01/18/2024 11:22   DG Chest 1 View Result Date: 01/18/2024 EXAM: 1 VIEW(S) XRAY OF THE CHEST 01/18/2024 09:55:00 AM COMPARISON: AP radiographs of the chest dated 05/03/2021. CLINICAL HISTORY: sob. Pt here from [facility]. Pt has been having hematuria, lower abd pain and lower back pain x1 week. Pt has also been having AMS x1 week. States that she has been forgetful having intermittent moments of not knowing who she is or where she is. Has also had ; congestion, SHOB, fatigue, and body aches. FINDINGS: LUNGS AND PLEURA: Mild diffuse interstitial prominence. No focal pulmonary opacity. No pulmonary edema. No pleural effusion. No pneumothorax. HEART AND MEDIASTINUM: Mild cardiomegaly. Atherosclerotic calcification of the aortic arch. BONES AND SOFT TISSUES: No acute osseous abnormality. IMPRESSION: 1. Mild diffuse interstitial prominence. 2. Mild cardiomegaly and atherosclerotic calcification of the aortic arch. Electronically signed by: Evalene Coho MD 01/18/2024 10:13 AM EDT RP Workstation: [id number]   MR  BRAIN W WO CONTRAST Result Date: 12/30/2023 EXAM: MRI BRAIN WITH AND WITHOUT CONTRAST 12/30/2023 08:40:48 PM TECHNIQUE: Multiplanar multisequence MRI of the head/brain was performed with and without the administration of intravenous contrast. COMPARISON: None available. CLINICAL HISTORY: Left sided lower facial weakness noted last week and possibly some confusion and ataxia. Clemens a week prior to that and hit her temple on the left and has some pain there. FINDINGS: BRAIN AND VENTRICLES: No acute infarct. No acute intracranial hemorrhage. No mass effect or midline shift. No hydrocephalus. Normal flow voids. Unchanged 6 x 3 mm extra-axial dural-based enhancing mass along the anterior aspect of the left porous acoustic, which likely abuts the adjacent 7th/8th nerve complex. ORBITS: No acute abnormality. SINUSES: No acute abnormality. BONES AND SOFT TISSUES: Normal bone marrow signal and enhancement. No acute soft tissue abnormality. IMPRESSION: 1. Unchanged 6 x 3 mm extra-axial dural-based enhancing mass along the anterior aspect of the left porous acoustic, which likely abuts the adjacent 7th/8th nerve complex. 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 2. No acute intracranial abnormality. Electronically signed by: Gilmore Molt MD 12/30/2023 08:57 PM EDT RP Workstation: [id number]    Microbiology: Results for orders placed or performed during  the hospital encounter of 01/18/24  Resp panel by RT-PCR (RSV, Flu A&B, Covid) Anterior Nasal Swab     Status: None   Collection Time: 01/18/24  9:39 AM   Specimen: Anterior Nasal Swab  Result Value Ref Range Status   SARS Coronavirus 2 by RT PCR NEGATIVE NEGATIVE Final    Comment: (NOTE) SARS-CoV-2 target nucleic acids are NOT DETECTED.  The SARS-CoV-2 RNA is generally detectable in upper respiratory specimens during the acute phase of infection. The lowest concentration of SARS-CoV-2 viral copies this assay can detect is 138 copies/mL. A negative result does not  preclude SARS-Cov-2 infection and should not be used as the sole basis for treatment or other patient management decisions. A negative result may occur with  improper specimen collection/handling, submission of specimen other than nasopharyngeal swab, presence of viral mutation(s) within the areas targeted by this assay, and inadequate number of viral copies(<138 copies/mL). A negative result must be combined with clinical observations, patient history, and epidemiological information. The expected result is Negative.  Fact Sheet for Patients:  bloggercourse.com  Fact Sheet for Healthcare Providers:  seriousbroker.it  This test is no t yet approved or cleared by the [place]  FDA and  has been authorized for detection and/or diagnosis of SARS-CoV-2 by FDA under an Emergency Use Authorization (EUA). This EUA will remain  in effect (meaning this test can be used) for the duration of the COVID-19 declaration under Section 564(b)(1) of the Act, 21 U.S.C.section 360bbb-3(b)(1), unless the authorization is terminated  or revoked sooner.       Influenza A by PCR NEGATIVE NEGATIVE Final   Influenza B by PCR NEGATIVE NEGATIVE Final    Comment: (NOTE) The Xpert Xpress SARS-CoV-2/FLU/RSV plus assay is intended as an aid in the diagnosis of influenza from Nasopharyngeal swab specimens and should not be used as a sole basis for treatment. Nasal washings and aspirates are unacceptable for Xpert Xpress SARS-CoV-2/FLU/RSV testing.  Fact Sheet for Patients: bloggercourse.com  Fact Sheet for Healthcare Providers: seriousbroker.it  This test is not yet approved or cleared by the [place]  FDA and has been authorized for detection and/or diagnosis of SARS-CoV-2 by FDA under an Emergency Use Authorization (EUA). This EUA will remain in effect (meaning this test can be used) for the  duration of the COVID-19 declaration under Section 564(b)(1) of the Act, 21 U.S.C. section 360bbb-3(b)(1), unless the authorization is terminated or revoked.     Resp Syncytial Virus by PCR NEGATIVE NEGATIVE Final    Comment: (NOTE) Fact Sheet for Patients: bloggercourse.com  Fact Sheet for Healthcare Providers: seriousbroker.it  This test is not yet approved or cleared by the [place]  FDA and has been authorized for detection and/or diagnosis of SARS-CoV-2 by FDA under an Emergency Use Authorization (EUA). This EUA will remain in effect (meaning this test can be used) for the duration of the COVID-19 declaration under Section 564(b)(1) of the Act, 21 U.S.C. section 360bbb-3(b)(1), unless the authorization is terminated or revoked.  Performed at [facility], [address] [place], [place] [address]   Respiratory (~20 pathogens) panel by PCR     Status: None   Collection Time: 01/19/24 12:25 PM   Specimen: Nasopharyngeal Swab; Respiratory  Result Value Ref Range Status   Adenovirus NOT DETECTED NOT DETECTED Final   Coronavirus 229E NOT DETECTED NOT DETECTED Final    Comment: (NOTE) The Coronavirus on the Respiratory Panel, DOES NOT test for the novel  Coronavirus (2019 nCoV)    Coronavirus HKU1  NOT DETECTED NOT DETECTED Final   Coronavirus NL63 NOT DETECTED NOT DETECTED Final   Coronavirus OC43 NOT DETECTED NOT DETECTED Final   Metapneumovirus NOT DETECTED NOT DETECTED Final   Rhinovirus / Enterovirus NOT DETECTED NOT DETECTED Final   Influenza A NOT DETECTED NOT DETECTED Final   Influenza B NOT DETECTED NOT DETECTED Final   Parainfluenza Virus 1 NOT DETECTED NOT DETECTED Final   Parainfluenza Virus 2 NOT DETECTED NOT DETECTED Final   Parainfluenza Virus 3 NOT DETECTED NOT DETECTED Final   Parainfluenza Virus 4 NOT DETECTED NOT DETECTED Final   Respiratory Syncytial Virus NOT DETECTED NOT DETECTED Final    Bordetella pertussis NOT DETECTED NOT DETECTED Final   Bordetella Parapertussis NOT DETECTED NOT DETECTED Final   Chlamydophila pneumoniae NOT DETECTED NOT DETECTED Final   Mycoplasma pneumoniae NOT DETECTED NOT DETECTED Final    Comment: Performed at [facility], [address] [place], [place] [address]    Labs: CBC: Recent Labs  Lab 01/18/24 0933 01/19/24 0454 01/20/24 0435  WBC 7.3 6.2 5.4  HGB 13.2 12.1 12.2  HCT 39.8 36.0 36.3  MCV 81.9 80.5 81.6  PLT 207 190 213   Basic Metabolic Panel: Recent Labs  Lab 01/18/24 0933 01/19/24 0454 01/20/24 0435  NA 134* 137 141  K 3.4* 3.3* 3.7  CL 101 107 110  CO2 20* 21* 22  GLUCOSE 152* 146* 123*  BUN 29* 30* 24*  CREATININE 2.15* 1.92* 1.38*  CALCIUM 8.3* 8.3* 8.6*  MG 1.8 1.9 1.9   Liver Function Tests: Recent Labs  Lab 01/18/24 0933  AST 25  ALT 26  ALKPHOS 56  BILITOT 0.9  PROT 7.0  ALBUMIN 3.0*   CBG: No results for input(s): GLUCAP in the last 168 hours.  Discharge time spent: greater than 30 minutes.  Signed: Laree Lock, MD [facility] 01/20/2024

## 2024-01-20 NOTE — Progress Notes (Signed)
 Physical Therapy Treatment Patient Details Name: Stacy Reese MRN: [id number] DOB: 1945/11/29 Today's Date: 01/20/2024   History of Present Illness Pt is a 78 y/o F admitted on 01/18/24 after presenting with c/o malaise, fatigue & confusion. Pt is being treated for AKI. PMH: HTN, HLD, a-fib on coumadin , PE, DVT    PT Comments  Patient is agreeable to PT session. She is hopeful to go home today if possible. Increased independence with mobility today compared to prior session. She was able to stand with no physical assistance required. Patient ambulated in hallway without device with CGA initially progressing to supervision. Education on fall prevention strategies to use in home setting. She declined the need for home health PT.    If plan is discharge home, recommend the following: A little help with walking and/or transfers;A little help with bathing/dressing/bathroom;Help with stairs or ramp for entrance   Can travel by private vehicle        Equipment Recommendations  None recommended by PT (patient has RW at home)    Recommendations for Other Services       Precautions / Restrictions Precautions Precautions: Fall Restrictions Weight Bearing Restrictions Per Provider Order: No     Mobility  Bed Mobility               General bed mobility comments: not assessed as patient sitting up on arrival and post session    Transfers Overall transfer level: Needs assistance Equipment used: None Transfers: Sit to/from Stand Sit to Stand: Supervision           General transfer comment: no physical assistance required for standing from recliner chair    Ambulation/Gait Ambulation/Gait assistance: Supervision, Contact guard assist Gait Distance (Feet): 160 Feet Assistive device: None Gait Pattern/deviations: Step-through pattern, Decreased stride length Gait velocity: decreased     General Gait Details: patient ambulated in hallway without device. she is declining  using rolling walker.  fall prevention education provided for home setting including using assistive devices as needed.   Stairs             Wheelchair Mobility     Tilt Bed    Modified Rankin (Stroke Patients Only)       Balance Overall balance assessment: Needs assistance Sitting-balance support: Feet supported, No upper extremity supported Sitting balance-Leahy Scale: Good     Standing balance support: No upper extremity supported Standing balance-Leahy Scale: Fair Standing balance comment: supervision for safety                            Communication Communication Communication: No apparent difficulties  Cognition Arousal: Alert Behavior During Therapy: WFL for tasks assessed/performed   PT - Cognitive impairments: No apparent impairments                         Following commands: Intact      Cueing Cueing Techniques: Verbal cues  Exercises      General Comments        Pertinent Vitals/Pain Pain Assessment Pain Assessment: No/denies pain    Home Living                          Prior Function            PT Goals (current goals can now be found in the care plan section) Acute Rehab PT Goals Patient Stated Goal: to go  home PT Goal Formulation: With patient Time For Goal Achievement: 02/02/24 Potential to Achieve Goals: Good Progress towards PT goals: Progressing toward goals    Frequency    Min 2X/week      PT Plan      Co-evaluation              AM-PAC PT 6 Clicks Mobility   Outcome Measure  Help needed turning from your back to your side while in a flat bed without using bedrails?: None Help needed moving from lying on your back to sitting on the side of a flat bed without using bedrails?: None Help needed moving to and from a bed to a chair (including a wheelchair)?: A Little Help needed standing up from a chair using your arms (e.g., wheelchair or bedside chair)?: A Little Help  needed to walk in hospital room?: A Little Help needed climbing 3-5 steps with a railing? : A Little 6 Click Score: 20    End of Session   Activity Tolerance: Patient tolerated treatment well Patient left: in chair;with call bell/phone within reach   PT Visit Diagnosis: Unsteadiness on feet (R26.81)     Time: 1020-1036 PT Time Calculation (min) (ACUTE ONLY): 16 min  Charges:    $Therapeutic Activity: 8-22 mins PT General Charges $$ ACUTE PT VISIT: 1 Visit                     Stacy Reese, PT, MPT    Stacy Reese 01/20/2024, 10:42 AM

## 2024-01-20 NOTE — Consult Note (Signed)
 PHARMACY - ANTICOAGULATION CONSULT NOTE  Pharmacy Consult for Warfarin Indication: atrial fibrillation and Hx of VTE  Allergies  Allergen Reactions   Metoprolol  Shortness Of Breath   Other Other (See Comments)    Sodium pentothal  Causes blood clots    Patient Measurements: Height: 5' 5 (165.1 cm) Weight: 82.9 kg (182 lb 12.2 oz) IBW/kg (Calculated) : 57 HEPARIN DW (KG): 77  Vital Signs: Temp: 97.9 F (36.6 C) (10/30 0411) Temp Source: Oral (10/30 0411) BP: 124/68 (10/30 0411) Pulse Rate: 69 (10/30 0411)  Labs: Recent Labs    01/18/24 0933 01/19/24 0454 01/20/24 0435  HGB 13.2 12.1 12.2  HCT 39.8 36.0 36.3  PLT 207 190 213  LABPROT 18.6* 19.1* 21.6*  INR 1.5* 1.5* 1.8*  CREATININE 2.15* 1.92* 1.38*  CKTOTAL  --  26*  --   TROPONINIHS 6  --   --     Estimated Creatinine Clearance: 36.9 mL/min (A) (by C-G formula based on SCr of 1.38 mg/dL (H)).   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 08/22/2007   L pretibial inf   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 01/30/2010   R inf pretibial - ED&C    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 04/14/2016   L  mid med pretibial    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 05/20/2016   L mid to distal med pretibial    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 07/20/2016   L mid med pretibial    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 05/10/2017   L mid pretibial    Personal history of chemotherapy    1 round   Personal history of radiation therapy    Skin cancer    squamous cell   Squamous cell carcinoma of skin 03/24/2006   L sup pretibial    Squamous cell carcinoma of skin 03/24/2006   R pretibial inf    Squamous cell carcinoma of skin 05/25/2006   L lat mid lower leg    Squamous cell carcinoma of skin 06/29/2006   R pretibial lat    Squamous cell carcinoma of skin 10/17/2012   R prox pretibial ED&C   Squamous cell carcinoma of skin 12/19/2012   R prox sup pretibial    Squamous cell carcinoma of skin 02/03/2016   L med mid pretibial    Squamous cell carcinoma of skin 08/20/2016   L pretibial sup    Squamous cell carcinoma of skin 08/20/2016   L pretibial inf    Squamous cell carcinoma of skin 10/10/2018   L prox med pretibial  Squamous cell carcinoma of skin 06/29/2019   L calf    Wears dentures    full upper and lower    Medications:  Medications Prior to Admission  Medication Sig Dispense Refill Last Dose/Taking   albuterol  (VENTOLIN  HFA) 108 (90 Base) MCG/ACT inhaler Inhale into the lungs every 6 (six) hours as needed for wheezing or shortness of breath.   Unknown   bumetanide (BUMEX) 1 MG tablet Take 1 mg by mouth daily.   01/17/2024   busPIRone  (BUSPAR ) 10 MG tablet Take 10 mg by mouth daily.    01/17/2024   citalopram  (CELEXA ) 20 MG tablet Take 20 mg by mouth daily.    01/17/2024   cyanocobalamin (VITAMIN B12) 1000 MCG tablet Take 1,000 mcg by mouth daily.   01/17/2024   diltiazem  (CARDIZEM  CD) 240 MG 24 hr capsule Take 240 mg by mouth daily.   01/17/2024   LORazepam  (ATIVAN ) 1 MG tablet Take 1 mg by mouth daily as needed for  anxiety.   Unknown   losartan (COZAAR) 100 MG tablet Take 100 mg by mouth daily.   01/17/2024   Multiple Vitamins-Minerals (VISION FORMULA PO) Take by mouth daily.   01/17/2024   pantoprazole  (PROTONIX ) 40 MG tablet Take 1 tablet (40 mg total) by mouth daily. 30 tablet 11 01/17/2024   potassium chloride  (KLOR-CON ) 10 MEQ tablet Take 10 mEq by mouth daily.   01/17/2024   pravastatin  (PRAVACHOL ) 40 MG tablet Take 40 mg by mouth daily.    01/17/2024   warfarin (COUMADIN ) 1 MG tablet Take 1 mg by mouth daily. Take 1/2 tablet every day (total of 7.5 mg on Thursday and Saturday and 5.5 mg all other days)   01/17/2024   warfarin (COUMADIN ) 2 MG tablet Take 2 mg by mouth daily. Take 1 tablet on Thursday and Saturday. Take along with 5 mg and half of 1 mg tablet (Total 7.5 mg)   01/17/2024   warfarin (COUMADIN ) 5 MG tablet Take 5 mg by mouth See admin instructions. Takes 5 mg tablets daily (7.5 mg on Thursday and Saturday and 5.5 mg all other days)   01/17/2024   furosemide  (LASIX ) 40 MG tablet Take 40 mg by mouth daily as needed. (Patient not taking: Reported on 01/18/2024)   Not Taking   tacrolimus  (PROTOPIC ) 0.1 % ointment Apply topically 2 (two) times daily. To lower legs as needed. (Patient not taking: Reported on 01/18/2024) 100 g 5 Not Taking   Scheduled:   busPIRone   10 mg Oral Daily   citalopram   20 mg Oral Daily   cyanocobalamin  1,000 mcg Oral Daily   diltiazem   240 mg Oral Daily   enoxaparin (LOVENOX) injection  1 mg/kg Subcutaneous Q12H   pantoprazole   40 mg Oral Daily   pravastatin   40 mg Oral QPM   sodium chloride  flush  3 mL Intravenous Q12H   Warfarin - Pharmacist Dosing Inpatient   Does not apply q1600   Infusions:  PRN: acetaminophen  **OR** acetaminophen , senna-docusate Anti-infectives (From admission, onward)    None      Assessment: 78 y.o. year old female with past medical history of hypertension, hyperlipidemia, A-fib on Coumadin , history of PE and DVT presenting to the  ED from [facility] due to malaise, fatigue and confusion and admitted for an AKI. Per RN note, concern for hematuria and discussion with MD ok to restart warfarin at this time. CHADSVASc of 6 and INR is subtherapeutic. Spoke with patient and she takes warfarin 7.5 mg on Thursdays  and Saturdays and 5.5 mg all other days. Pt admits to missing a few doses this week.   Date INR Warfarin Dose  10/28 1.5 7.5 mg   10/29 1.5 9 mg  10/30 1.8 7.5 mg   Goal of Therapy:  INR 2-3 Monitor platelets by anticoagulation protocol: Yes   Plan: . INR remains subtherapeutic, CBC WNL Give warfarin 7.5 mg x1 today (increase from home dose) Continue lovenox 90 mg SQ every 12 hours Continue to bridge with lovenox until INR is therapeutic Monitor INR daily while inpatient CBC at least every 72 hours  Thank you for involving pharmacy in this patient's care.   Damien Napoleon, PharmD Clinical Pharmacist 01/20/2024 7:13 AM

## 2024-04-27 ENCOUNTER — Ambulatory Visit: Admitting: Dermatology

## 2024-05-02 ENCOUNTER — Encounter (INDEPENDENT_AMBULATORY_CARE_PROVIDER_SITE_OTHER): Admitting: Vascular Surgery

## 2024-05-04 ENCOUNTER — Ambulatory Visit: Admitting: Dermatology

## 2024-05-16 ENCOUNTER — Encounter (INDEPENDENT_AMBULATORY_CARE_PROVIDER_SITE_OTHER): Admitting: Vascular Surgery
